# Patient Record
Sex: Male | Born: 1960 | Race: White | Hispanic: No | Marital: Married | Smoking: Never smoker
Health system: Southern US, Community
[De-identification: ages and names within clinical notes are randomized; demographics above are authoritative.]

## PROBLEM LIST (undated history)

## (undated) DIAGNOSIS — I2699 Other pulmonary embolism without acute cor pulmonale: Secondary | ICD-10-CM

## (undated) DIAGNOSIS — Z91148 Patient's other noncompliance with medication regimen for other reason: Secondary | ICD-10-CM

## (undated) DIAGNOSIS — I5020 Unspecified systolic (congestive) heart failure: Secondary | ICD-10-CM

## (undated) DIAGNOSIS — I4811 Longstanding persistent atrial fibrillation: Secondary | ICD-10-CM

## (undated) DIAGNOSIS — I4819 Other persistent atrial fibrillation: Secondary | ICD-10-CM

## (undated) DIAGNOSIS — F1010 Alcohol abuse, uncomplicated: Secondary | ICD-10-CM

## (undated) DIAGNOSIS — I428 Other cardiomyopathies: Secondary | ICD-10-CM

## (undated) DIAGNOSIS — U071 COVID-19: Secondary | ICD-10-CM

## (undated) DIAGNOSIS — I429 Cardiomyopathy, unspecified: Secondary | ICD-10-CM

## (undated) DIAGNOSIS — G8929 Other chronic pain: Secondary | ICD-10-CM

## (undated) DIAGNOSIS — Z9114 Patient's other noncompliance with medication regimen: Secondary | ICD-10-CM

---
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
## 2013-08-25 ENCOUNTER — Ambulatory Visit: Payer: Self-pay | Admitting: Unknown Physician Specialty

## 2013-10-09 DIAGNOSIS — M1990 Unspecified osteoarthritis, unspecified site: Secondary | ICD-10-CM | POA: Insufficient documentation

## 2013-10-09 DIAGNOSIS — I10 Essential (primary) hypertension: Secondary | ICD-10-CM | POA: Insufficient documentation

## 2013-10-09 DIAGNOSIS — M109 Gout, unspecified: Secondary | ICD-10-CM | POA: Insufficient documentation

## 2016-09-10 ENCOUNTER — Emergency Department: Payer: BLUE CROSS/BLUE SHIELD

## 2016-09-10 ENCOUNTER — Emergency Department
Admission: EM | Admit: 2016-09-10 | Discharge: 2016-09-10 | Disposition: A | Discharge status: Home / Self Care | Payer: BLUE CROSS/BLUE SHIELD | Attending: Emergency Medicine | Admitting: Emergency Medicine

## 2016-09-10 ENCOUNTER — Encounter: Payer: Self-pay | Admitting: *Deleted

## 2016-09-10 DIAGNOSIS — R1110 Vomiting, unspecified: Secondary | ICD-10-CM | POA: Insufficient documentation

## 2016-09-10 DIAGNOSIS — R1031 Right lower quadrant pain: Secondary | ICD-10-CM | POA: Diagnosis present

## 2016-09-10 DIAGNOSIS — N23 Unspecified renal colic: Secondary | ICD-10-CM | POA: Diagnosis not present

## 2016-09-10 LAB — LIPASE, BLOOD: Lipase: 28 U/L (ref 11–51)

## 2016-09-10 LAB — CBC WITH DIFFERENTIAL/PLATELET
Basophils Absolute: 0 10*3/uL (ref 0–0.1)
Basophils Relative: 1 %
Eosinophils Absolute: 0.1 10*3/uL (ref 0–0.7)
Eosinophils Relative: 1 %
HCT: 39.7 % — ABNORMAL LOW (ref 40.0–52.0)
Hemoglobin: 13.4 g/dL (ref 13.0–18.0)
Lymphocytes Relative: 28 %
Lymphs Abs: 1.9 10*3/uL (ref 1.0–3.6)
MCH: 30.2 pg (ref 26.0–34.0)
MCHC: 33.7 g/dL (ref 32.0–36.0)
MCV: 89.6 fL (ref 80.0–100.0)
Monocytes Absolute: 0.6 10*3/uL (ref 0.2–1.0)
Monocytes Relative: 9 %
Neutro Abs: 4.2 10*3/uL (ref 1.4–6.5)
Neutrophils Relative %: 61 %
Platelets: 198 10*3/uL (ref 150–440)
RBC: 4.43 MIL/uL (ref 4.40–5.90)
RDW: 13.7 % (ref 11.5–14.5)
WBC: 6.7 10*3/uL (ref 3.8–10.6)

## 2016-09-10 LAB — COMPREHENSIVE METABOLIC PANEL
ALT: 16 U/L — ABNORMAL LOW (ref 17–63)
AST: 31 U/L (ref 15–41)
Albumin: 4.1 g/dL (ref 3.5–5.0)
Alkaline Phosphatase: 54 U/L (ref 38–126)
Anion gap: 8 (ref 5–15)
BUN: 16 mg/dL (ref 6–20)
CO2: 25 mmol/L (ref 22–32)
Calcium: 9.1 mg/dL (ref 8.9–10.3)
Chloride: 105 mmol/L (ref 101–111)
Creatinine, Ser: 1.31 mg/dL — ABNORMAL HIGH (ref 0.61–1.24)
GFR calc Af Amer: >60 mL/min (ref >60)
GFR calc non Af Amer: >60 mL/min (ref >60)
Glucose, Bld: 120 mg/dL — ABNORMAL HIGH (ref 65–99)
Potassium: 4.1 mmol/L (ref 3.5–5.1)
Sodium: 138 mmol/L (ref 135–145)
Total Bilirubin: 0.6 mg/dL (ref 0.3–1.2)
Total Protein: 7.7 g/dL (ref 6.5–8.1)

## 2016-09-10 MED ORDER — ONDANSETRON HCL 4 MG/2ML IJ SOLN: 4.0000 mg | Freq: Once | INTRAMUSCULAR | Status: DC

## 2016-09-10 MED ORDER — HYDROMORPHONE HCL 1 MG/ML IJ SOLN
INTRAMUSCULAR | Status: AC
  Filled 2016-09-10: qty 1

## 2016-09-10 MED ORDER — KETOROLAC TROMETHAMINE 30 MG/ML IJ SOLN
15.0000 mg | INTRAMUSCULAR | Status: AC
  Administered 2016-09-10: 15 mg via INTRAVENOUS
  Filled 2016-09-10: qty 1

## 2016-09-10 MED ORDER — SODIUM CHLORIDE 0.9 % IV BOLUS (SEPSIS)
1000.0000 mL | Freq: Once | INTRAVENOUS | Status: AC
  Administered 2016-09-10: 1000 mL via INTRAVENOUS

## 2016-09-10 MED ORDER — ONDANSETRON HCL 4 MG/2ML IJ SOLN
INTRAMUSCULAR | Status: AC
  Filled 2016-09-10: qty 2

## 2016-09-10 MED ORDER — HYDROMORPHONE HCL 1 MG/ML IJ SOLN
1.0000 mg | INTRAMUSCULAR | Status: AC
  Administered 2016-09-10: 1 mg via INTRAVENOUS

## 2016-09-10 MED ORDER — HYDROMORPHONE HCL 1 MG/ML IJ SOLN: 1.0000 mg | Freq: Once | INTRAMUSCULAR | Status: DC

## 2016-09-10 MED ORDER — ONDANSETRON HCL 4 MG/2ML IJ SOLN
4.0000 mg | Freq: Once | INTRAMUSCULAR | Status: AC
  Administered 2016-09-10: 4 mg via INTRAVENOUS

## 2016-09-10 MED ORDER — NAPROXEN 500 MG PO TABS: 500.0000 mg | ORAL_TABLET | Freq: Two times a day (BID) | ORAL | 0 refills | Status: DC

## 2016-09-10 NOTE — ED Triage Notes (Signed)
States right groin pain that goes down to his scrotum that began 45 mins ago, pt shifting in chair and unable to stay still, pt pale and diaphoretic, states vomiting, denies any blood in urine

## 2016-09-10 NOTE — ED Notes (Addendum)
Pt left prior to EKG being completed.   EDP notified. Orders to be discontinued.

## 2016-09-10 NOTE — ED Notes (Signed)
Right groin and testicle pain that started suddenly X 45 min ago. Pt pale, squirming.

## 2016-09-10 NOTE — ED Notes (Signed)
Pt alert and oriented X4, active, cooperative, pt in NAD. RR even and unlabored, color WNL.  Pt informed to return if any life threatening symptoms occur.   

## 2016-09-10 NOTE — ED Provider Notes (Signed)
[facility] Emergency Department Provider Note  ____________________________________________  Time seen: Approximately 12:52 PM  I have reviewed the triage vital signs and the nursing notes.   HISTORY  Chief Complaint Groin Pain    HPI IVAAN LIDDY is a 56 y.o. male who complains of sudden onset right flank pain radiating to the right testicle started about 11:30 AM today. 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 He ate breakfast today but had not eaten lunch. Denies any recent postprandial symptoms. No known medical history. No hernias. No dysuria frequency urgency or other urinary trouble. Had vomiting today after the pain started.No fever or chills. Pain is severe, waxing and waning, no aggravating or alleviating factors.     History reviewed. No pertinent past medical history.   There are no active problems to display for this patient.    History reviewed. No pertinent surgical history.   Prior to Admission medications   Medication Sig Start Date End Date Taking? Authorizing Provider  naproxen (NAPROSYN) 500 MG tablet Take 1 tablet (500 mg total) by mouth 2 (two) times daily with a meal. 09/10/16   Carrie Mew, MD  None   Allergies Patient has no known allergies.   History reviewed. No pertinent family history.  Social History Social History  Substance Use Topics  . Smoking status: Never Smoker  . Smokeless tobacco: Never Used  . Alcohol use No    Review of Systems  Constitutional:   No fever or chills.  ENT:   No sore throat. No rhinorrhea. Cardiovascular:   No chest pain or syncope. Respiratory:   No dyspnea or cough. Gastrointestinal:   Positive as above for abdominal pain with vomiting. No diarrhea or constipation.  Musculoskeletal:   Negative for focal pain or swelling All other systems reviewed and are negative except as documented above in ROS and  HPI.  ____________________________________________   PHYSICAL EXAM:  VITAL SIGNS: ED Triage Vitals  Enc Vitals Group     BP 09/10/16 1230 (!) 164/81     Pulse Rate 09/10/16 1230 77     Resp 09/10/16 1230 18     Temp 09/10/16 1230 97.3 F (36.3 C)     Temp Source 09/10/16 1230 Oral     SpO2 09/10/16 1230 100 %     Weight 09/10/16 1228 190 lb (86.2 kg)     Height 09/10/16 1228 5\' 11"  (1.803 m)     Head Circumference --      Peak Flow --      Pain Score 09/10/16 1228 10     Pain Loc --      Pain Edu? --      Excl. in [place]? --     Vital signs reviewed, nursing assessments reviewed.   Constitutional:   Alert and oriented. Uncomfortable but not in distress. Eyes:   No scleral icterus.  EOMI. No nystagmus. No conjunctival pallor. PERRL. ENT   Head:   Normocephalic and atraumatic.   Nose:   No congestion/rhinnorhea.    Mouth/Throat:   MMM, no pharyngeal erythema. No peritonsillar mass.    Neck:   No meningismus. Full ROM Hematological/Lymphatic/Immunilogical:   No cervical lymphadenopathy. Cardiovascular:   RRR. Symmetric bilateral radial and DP pulses.  No murmurs.  Respiratory:   Normal respiratory effort without tachypnea/retractions. Breath sounds are clear and equal bilaterally. No wheezes/rales/rhonchi. Gastrointestinal:   Soft with diffuse right-sided abdominal tenderness. Non distended. There is no CVA tenderness.  No  rebound, rigidity, or guarding. Genitourinary:   Normal. No scrotal mass, testicles nontender. No horizontal lie Musculoskeletal:   Normal range of motion in all extremities. No joint effusions.  No lower extremity tenderness.  No edema. Neurologic:   Normal speech and language.  Motor grossly intact. No gross focal neurologic deficits are appreciated.  Skin:    Skin is warm, dry and intact. No rash noted.  No petechiae, purpura, or bullae.  ____________________________________________    LABS (pertinent positives/negatives) (all labs  ordered are listed, but only abnormal results are displayed) Labs Reviewed  COMPREHENSIVE METABOLIC PANEL - Abnormal; Notable for the following:       Result Value   Glucose, Bld 120 (*)    Creatinine, Ser 1.31 (*)    ALT 16 (*)    All other components within normal limits  CBC WITH DIFFERENTIAL/PLATELET - Abnormal; Notable for the following:    HCT 39.7 (*)    All other components within normal limits  URINE CULTURE  LIPASE, BLOOD  URINALYSIS, COMPLETE (UACMP) WITH MICROSCOPIC   ____________________________________________   EKG    ____________________________________________    RADIOLOGY  Ct Renal Stone Study  Result Date: 09/10/2016 CLINICAL DATA:  Right flank and groin pain.  Vomiting. EXAM: CT ABDOMEN AND PELVIS WITHOUT CONTRAST TECHNIQUE: Multidetector CT imaging of the abdomen and pelvis was performed following the standard protocol without IV contrast. COMPARISON:  None. FINDINGS: Lower chest: Small hiatal hernia. Lung bases are clear. Heart size is normal. Hepatobiliary: No focal liver abnormality is seen. No gallstones, gallbladder wall thickening, or biliary dilatation. Pancreas: Unremarkable. No pancreatic ductal dilatation or surrounding inflammatory changes. Spleen: Normal in size without focal abnormality. Adrenals/Urinary Tract: There is slight prominence of the right renal collecting system. There is a new 1 mm calcification in the right side of the bladder consistent with a recently passed stone. No renal calculi. Left kidney appears normal. Bladder and adrenal glands are normal. Stomach/Bowel: Stomach is within normal limits. Appendix appears normal. No evidence of bowel wall thickening, distention, or inflammatory changes. Multiple diverticula scattered in the colon, most prominent in the sigmoid region. Vascular/Lymphatic: The minimal aortic atherosclerosis. No adenopathy. Reproductive: Prostate is unremarkable. Other: Tiny bilateral inguinal hernias containing only  fat, left larger than right. Musculoskeletal: No acute or significant osseous findings. IMPRESSION: 1 mm calcification in the right side of the bladder consistent with a recently passed right ureteral stone. Minimal dilatation of the right renal collecting system. Otherwise, no acute abnormality. Diverticulosis of the colon. Electronically Signed   By: Lorriane Shire M.D.   On: 09/10/2016 13:29    ____________________________________________   PROCEDURES Procedures  ____________________________________________   INITIAL IMPRESSION / ASSESSMENT AND PLAN / ED COURSE  Pertinent labs & imaging results that were available during my care of the patient were reviewed by me and considered in my medical decision making (see chart for details).  Patient presents with severe abdominal pain sudden onset. Differential includes renal colic, appendicitis, cholecystitis. Unlikely torsion AAA bowel obstruction or perforation. Doubt pancreatitis. Very low suspicion for cardiopulmonary pathology such as ACS PE or dissection. We'll check labs, CT abdomen, Dilaudid Zofran and IV fluids.   ----------------------------------------- 3:46 PM on 09/10/2016 -----------------------------------------  Abdomen benign, no vomiting. Tolerating oral intake. Pain dramatically improved. CT shows 1 mm stone in the bladder consistent with ureteral colic which she is now passed the stone. Condition should be very self-limited at this point. We'll discharge home, follow up with primary care.Considering the patient's symptoms, medical history, and  physical examination today, I have low suspicion for cholecystitis or biliary pathology, pancreatitis, perforation or bowel obstruction, hernia, intra-abdominal abscess, AAA or dissection, volvulus or intussusception, mesenteric ischemia, or appendicitis.        ____________________________________________   FINAL CLINICAL IMPRESSION(S) / ED DIAGNOSES  Final diagnoses:   Ureteral colic      New Prescriptions   NAPROXEN (NAPROSYN) 500 MG TABLET    Take 1 tablet (500 mg total) by mouth 2 (two) times daily with a meal.     Portions of this note were generated with dragon dictation software. Dictation errors may occur despite best attempts at proofreading.    Carrie Mew, MD 09/10/16 [phone]

## 2017-11-09 ENCOUNTER — Other Ambulatory Visit: Payer: Self-pay | Admitting: Sports Medicine

## 2017-11-09 DIAGNOSIS — M5441 Lumbago with sciatica, right side: Principal | ICD-10-CM

## 2017-11-09 DIAGNOSIS — G8929 Other chronic pain: Secondary | ICD-10-CM

## 2017-11-09 DIAGNOSIS — M47816 Spondylosis without myelopathy or radiculopathy, lumbar region: Secondary | ICD-10-CM

## 2017-11-09 DIAGNOSIS — M5136 Other intervertebral disc degeneration, lumbar region: Secondary | ICD-10-CM

## 2017-11-30 ENCOUNTER — Ambulatory Visit: Payer: BLUE CROSS/BLUE SHIELD

## 2018-08-28 ENCOUNTER — Emergency Department: Payer: BLUE CROSS/BLUE SHIELD

## 2018-08-28 ENCOUNTER — Encounter: Payer: Self-pay | Admitting: Emergency Medicine

## 2018-08-28 ENCOUNTER — Emergency Department
Admission: EM | Admit: 2018-08-28 | Discharge: 2018-08-28 | Disposition: A | Discharge status: Home / Self Care | Payer: BLUE CROSS/BLUE SHIELD | Attending: Emergency Medicine | Admitting: Emergency Medicine

## 2018-08-28 DIAGNOSIS — R002 Palpitations: Secondary | ICD-10-CM | POA: Diagnosis present

## 2018-08-28 DIAGNOSIS — F17220 Nicotine dependence, chewing tobacco, uncomplicated: Secondary | ICD-10-CM | POA: Diagnosis not present

## 2018-08-28 DIAGNOSIS — I4891 Unspecified atrial fibrillation: Secondary | ICD-10-CM | POA: Diagnosis not present

## 2018-08-28 LAB — CBC WITH DIFFERENTIAL/PLATELET
Abs Immature Granulocytes: 0.02 10*3/uL (ref 0.00–0.07)
Basophils Absolute: 0 10*3/uL (ref 0.0–0.1)
Basophils Relative: 1 %
Eosinophils Absolute: 0.1 10*3/uL (ref 0.0–0.5)
Eosinophils Relative: 2 %
HCT: 39.1 % (ref 39.0–52.0)
Hemoglobin: 12.4 g/dL — ABNORMAL LOW (ref 13.0–17.0)
Immature Granulocytes: 0 %
Lymphocytes Relative: 21 %
Lymphs Abs: 1.2 10*3/uL (ref 0.7–4.0)
MCH: 29 pg (ref 26.0–34.0)
MCHC: 31.7 g/dL (ref 30.0–36.0)
MCV: 91.6 fL (ref 80.0–100.0)
Monocytes Absolute: 0.4 10*3/uL (ref 0.1–1.0)
Monocytes Relative: 7 %
Neutro Abs: 4 10*3/uL (ref 1.7–7.7)
Neutrophils Relative %: 69 %
Platelets: 200 10*3/uL (ref 150–400)
RBC: 4.27 MIL/uL (ref 4.22–5.81)
RDW: 13 % (ref 11.5–15.5)
WBC: 5.7 10*3/uL (ref 4.0–10.5)
nRBC: 0 % (ref 0.0–0.2)

## 2018-08-28 LAB — LIPASE, BLOOD: Lipase: 30 U/L (ref 11–51)

## 2018-08-28 LAB — COMPREHENSIVE METABOLIC PANEL
ALT: 21 U/L (ref 0–44)
AST: 29 U/L (ref 15–41)
Albumin: 3.7 g/dL (ref 3.5–5.0)
Alkaline Phosphatase: 45 U/L (ref 38–126)
Anion gap: 13 (ref 5–15)
BUN: 17 mg/dL (ref 6–20)
CO2: 19 mmol/L — ABNORMAL LOW (ref 22–32)
Calcium: 8.6 mg/dL — ABNORMAL LOW (ref 8.9–10.3)
Chloride: 109 mmol/L (ref 98–111)
Creatinine, Ser: 1.02 mg/dL (ref 0.61–1.24)
GFR calc Af Amer: >60 mL/min (ref >60)
GFR calc non Af Amer: >60 mL/min (ref >60)
Glucose, Bld: 123 mg/dL — ABNORMAL HIGH (ref 70–99)
Potassium: 3.5 mmol/L (ref 3.5–5.1)
Sodium: 141 mmol/L (ref 135–145)
Total Bilirubin: 0.4 mg/dL (ref 0.3–1.2)
Total Protein: 6.3 g/dL — ABNORMAL LOW (ref 6.5–8.1)

## 2018-08-28 LAB — TSH: TSH: 0.96 u[IU]/mL (ref 0.350–4.500)

## 2018-08-28 LAB — MAGNESIUM: Magnesium: 1.9 mg/dL (ref 1.7–2.4)

## 2018-08-28 MED ORDER — METOPROLOL TARTRATE 25 MG PO TABS
25.0000 mg | ORAL_TABLET | Freq: Once | ORAL | Status: AC
  Administered 2018-08-28: 15:00:00 25 mg via ORAL
  Filled 2018-08-28: qty 1

## 2018-08-28 MED ORDER — METOPROLOL SUCCINATE ER 100 MG PO TB24: 100.0000 mg | ORAL_TABLET | Freq: Every day | ORAL | 0 refills | Status: DC

## 2018-08-28 MED ORDER — DILTIAZEM HCL 25 MG/5ML IV SOLN
15.0000 mg | Freq: Once | INTRAVENOUS | Status: AC
  Administered 2018-08-28: 12:00:00 15 mg via INTRAVENOUS
  Filled 2018-08-28: qty 5

## 2018-08-28 MED ORDER — METOPROLOL TARTRATE 5 MG/5ML IV SOLN
2.5000 mg | Freq: Once | INTRAVENOUS | Status: AC
  Administered 2018-08-28: 15:00:00 2.5 mg via INTRAVENOUS
  Filled 2018-08-28: qty 5

## 2018-08-28 MED ORDER — RIVAROXABAN 20 MG PO TABS: 20.0000 mg | ORAL_TABLET | Freq: Every day | ORAL | 0 refills | Status: DC

## 2018-08-28 MED ORDER — METOPROLOL TARTRATE 5 MG/5ML IV SOLN: 5.0000 mg | Freq: Once | INTRAVENOUS | Status: DC

## 2018-08-28 MED ORDER — RIVAROXABAN 20 MG PO TABS
20.0000 mg | ORAL_TABLET | Freq: Once | ORAL | Status: AC
  Administered 2018-08-28: 15:00:00 20 mg via ORAL
  Filled 2018-08-28: qty 1

## 2018-08-28 NOTE — ED Notes (Signed)
Pt updating family

## 2018-08-28 NOTE — Discharge Instructions (Addendum)
Start taking metoprolol and Xarelto tomorrow.  I have provided a coupon for an initial first month of Xarelto.  He should follow-up with cardiology within the next week, for possible cardioversion and further work-up.  Try to avoid alcohol.  Drink plenty of fluids.

## 2018-08-28 NOTE — ED Triage Notes (Signed)
Pt to ED by EMS with c/o of chest pain that started yesterday. Pt states feels like he doesn't have any energy and his chest felt tight when he woke this morning. 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 Upon EMS arrival pt in Afib and has no hx.

## 2018-08-28 NOTE — ED Notes (Signed)
Pt HR 95 at rest, walked in room for 3 minutes, HR mostly 110-120, up to 130 for 1 second. HR at 86-93 after pt back in bed.

## 2018-08-28 NOTE — ED Provider Notes (Signed)
[facility] Emergency Department Provider Note  ____________________________________________   First MD Initiated Contact with Patient 08/28/18 1057     (approximate)  I have reviewed the triage vital signs and the nursing notes.   HISTORY  Chief Complaint Chest Pain    HPI Calvin Esparza is a 58 y.o. male here with shortness of breath and palpitations.  The patient states that he was in his usual state of health until earlier today.  He went to bed last night.  He did drink alcohol, which he does regularly every night.  He states that he woke up, and felt some mild pressure in his chest.  The pressure is persisted throughout the day today.  He has had associated  sensation that his heart was beating very quickly.  He went to work, and noticed that he felt very short of breath with any kind of exertion.  He subsequent presents for evaluation.  He denies any pain currently.  Denies any actual pain, but did feel palpitations as mentioned.  No other complaints.  No recent fevers or chills.  No recent medication changes or supplement use.  He has no cardiac history.     History reviewed. No pertinent past medical history.  There are no active problems to display for this patient.   History reviewed. No pertinent surgical history.  Prior to Admission medications   Medication Sig Start Date End Date Taking? Authorizing Provider  metoprolol succinate (TOPROL XL) 100 MG 24 hr tablet Take 1 tablet (100 mg total) by mouth daily for 30 days. Take with or immediately following a meal. 08/28/18 09/27/18  Duffy Bruce, MD  rivaroxaban (XARELTO) 20 MG TABS tablet Take 1 tablet (20 mg total) by mouth daily with supper. 08/28/18   Duffy Bruce, MD    Allergies Patient has no known allergies.  No family history on file.  Social History Social History   Tobacco Use  . Smoking status: Never Smoker  . Smokeless tobacco: Current User    Types: Chew  Substance  Use Topics  . Alcohol use: Yes  . Drug use: Not Currently    Review of Systems    Review of Systems  Constitutional: Positive for fatigue. Negative for chills and fever.  HENT: Negative for congestion and rhinorrhea.   Eyes: Negative for visual disturbance.  Respiratory: Positive for shortness of breath. Negative for cough.   Cardiovascular: Positive for palpitations.  Gastrointestinal: Negative for abdominal pain, diarrhea, nausea and vomiting.  Genitourinary: Negative for dysuria and flank pain.  Skin: Negative for rash and wound.  Neurological: Negative for weakness and light-headedness.  All other systems reviewed and are negative.    ____________________________________________  PHYSICAL EXAM:      VITAL SIGNS: ED Triage Vitals  Enc Vitals Group     BP 08/28/18 1109 (!) 147/105     Pulse Rate 08/28/18 1109 (!) 104     Resp 08/28/18 1109 18     Temp 08/28/18 1109 97.8 F (36.6 C)     Temp Source 08/28/18 1109 Oral     SpO2 08/28/18 1101 100 %     Weight 08/28/18 1110 178 lb (80.7 kg)     Height 08/28/18 1110 5\' 10"  (1.778 m)     Head Circumference --      Peak Flow --      Pain Score 08/28/18 1109 2     Pain Loc --      Pain Edu? --  Excl. in [place]? --      Physical Exam Vitals signs and nursing note reviewed.  Constitutional:      General: He is not in acute distress.    Appearance: He is well-developed.  HENT:     Head: Normocephalic and atraumatic.  Eyes:     Conjunctiva/sclera: Conjunctivae normal.  Neck:     Musculoskeletal: Neck supple.  Cardiovascular:     Rate and Rhythm: Tachycardia present. Rhythm irregularly irregular.     Heart sounds: Normal heart sounds. No murmur. No friction rub.  Pulmonary:     Effort: Pulmonary effort is normal. No respiratory distress.     Breath sounds: Normal breath sounds. No wheezing or rales.  Abdominal:     General: There is no distension.     Palpations: Abdomen is soft.     Tenderness: There is no  abdominal tenderness.  Skin:    General: Skin is warm.     Capillary Refill: Capillary refill takes less than 2 seconds.  Neurological:     Mental Status: He is alert and oriented to person, place, and time.     Motor: No abnormal muscle tone.       ____________________________________________   LABS (all labs ordered are listed, but only abnormal results are displayed)  Labs Reviewed  CBC WITH DIFFERENTIAL/PLATELET - Abnormal; Notable for the following components:      Result Value   Hemoglobin 12.4 (*)    All other components within normal limits  COMPREHENSIVE METABOLIC PANEL - Abnormal; Notable for the following components:   CO2 19 (*)    Glucose, Bld 123 (*)    Calcium 8.6 (*)    Total Protein 6.3 (*)    All other components within normal limits  LIPASE, BLOOD  MAGNESIUM  TSH    ____________________________________________  EKG: Tachycardia, ventricular rate 116.  New onset atrial fibrillation compared to prior.  Nonspecific ST changes, likely rate related.  No ST elevations or depressions. QRS narrow at 85. QTC wnl. ________________________________________  RADIOLOGY All imaging, including plain films, CT scans, and ultrasounds, independently reviewed by me, and interpretations confirmed via formal radiology reads.  ED MD interpretation:   CXR: Clear, no cardiomegaly or CHF, PNA, PTX  Official radiology report(s): Dg Chest 2 View  Result Date: 08/28/2018 CLINICAL DATA:  Atrial fibrillation. EXAM: CHEST - 2 VIEW COMPARISON:  None. FINDINGS: The heart, hila, mediastinum, lungs, and pleura are unremarkable. IMPRESSION: No active cardiopulmonary disease. Electronically Signed   By: Dorise Bullion III M.D   On: 08/28/2018 12:40    ____________________________________________  PROCEDURES   Procedure(s) performed (including Critical Care):  Procedures  ____________________________________________  INITIAL IMPRESSION / MDM / [place] / ED COURSE   As part of my medical decision making, I reviewed the following data within the electronic MEDICAL RECORD NUMBER Notes from prior ED visits and [place] Controlled Substance Database      *Calvin Esparza was evaluated in Emergency Department on 08/28/2018 for the symptoms described in the history of present illness. He was evaluated in the context of the global COVID-19 pandemic, which necessitated consideration that the patient might be at risk for infection with the SARS-CoV-2 virus that causes COVID-19. Institutional protocols and algorithms that pertain to the evaluation of patients at risk for COVID-19 are in a state of rapid change based on information released by regulatory bodies including the CDC and federal and state organizations. These policies and algorithms were followed during the patient's care in the  ED.  Some ED evaluations and interventions may be delayed as a result of limited staffing during the pandemic.*      CHA2Ds2-VASc Score for Atrial Fibrillation 0   Medical Decision Making: 58 yo M here with new onset atrial fibrillation. Suspect this is 2/2 his alcohol use, also made worse by dehydration related to work. Labs show mild anemia, CMP with CO2 19 likely 2/2 dehydration. Mag normal. TSH normal. CXR is clear. EKG non-ischemic, denies any CP and I do not suspect ACS. Following single dose of diltiazem, pt had excellent rate control and is ambulatory w/o symptoms. HR 90s when resting. I had a long discussion with pt re: admission, obs, d/c. Given that his labs are reassuring, VSS, and he is well appearing with good rate control now, feel it's reasonable to manage as outpt. Discussed case with Dr. Evert Kohl of Cardiology who is in agreement. Per Cards, will start on metop 100 QD, Xarelto, and d/c. I discussed risks/benefits of Xarelto, and he will take as he may be candidate for cardioversion this week. CHADS-VASC 0 so may not need long-term anticoag after DCCV per Cards, who is recommending  the anticoagulation. Coupon provided. Month of meds provided.  ____________________________________________  FINAL CLINICAL IMPRESSION(S) / ED DIAGNOSES  Final diagnoses:  New onset atrial fibrillation ([place])     MEDICATIONS GIVEN DURING THIS VISIT:  Medications  diltiazem (CARDIZEM) injection 15 mg (15 mg Intravenous Given 08/28/18 1223)  metoprolol tartrate (LOPRESSOR) tablet 25 mg (25 mg Oral Given 08/28/18 1457)  rivaroxaban (XARELTO) tablet 20 mg (20 mg Oral Given 08/28/18 1457)  metoprolol tartrate (LOPRESSOR) injection 2.5 mg (2.5 mg Intravenous Given 08/28/18 1458)     ED Discharge Orders         Ordered    metoprolol succinate (TOPROL XL) 100 MG 24 hr tablet  Daily     08/28/18 1456    rivaroxaban (XARELTO) 20 MG TABS tablet  Daily with supper     08/28/18 1456           Note:  This document was prepared using Dragon voice recognition software and may include unintentional dictation errors.   Duffy Bruce, MD 08/28/18 1806

## 2018-10-01 DIAGNOSIS — I4891 Unspecified atrial fibrillation: Secondary | ICD-10-CM | POA: Insufficient documentation

## 2018-10-01 DIAGNOSIS — F1010 Alcohol abuse, uncomplicated: Secondary | ICD-10-CM | POA: Insufficient documentation

## 2018-10-01 NOTE — Progress Notes (Signed)
Cardiology Office Note  Date:  10/04/2018   ID:  Calvin Esparza, DOB April 02, 1960, MRN [id number]  PCP:  Calvin Ruths, MD   Chief Complaint  Patient presents with  . office visit    F/U after ED visit for A Fib    HPI:  [name] is a 58 year old gentleman with past medical history of Alcohol abuse Chronic pain, knee pain Recently seen in the emergency room August 28, 2018 for chest pain, dizzy, atrial fibrillation Heart rate from 110 up to 130 bpm Who presents to establish care in the [place] office for his paroxysmal atrial fibrillation  Discussed recent events in the emergency room June 2020 diagnosis of atrial fibrillation  Was started on anticoagulation, Xarelto, metoprolol 100 daily  Symptoms leading to his ER visit , had diaphoresis, sob,  Out of xarleto the past several days  Thinks atrial fib lasted a few days then went back to normal No stressor before atrial fib, denies excessive alcohol, pain diarrhea URI symptoms  At baseline pours concrete, active, no chest pains or shortness of breath Reports he is nondiabetic, no hypertension at baseline, no prior stroke,  chads vasc 0, possibly 1 if you include hypertension  EKG personally reviewed by myself on todays visit Shows NSR raet 66 bpm, no significant ST or T wave changes   PMH:   has no past medical history on file.  PSH:   History reviewed. No pertinent surgical history.  Current Outpatient Medications  Medication Sig Dispense Refill  . metoprolol succinate (TOPROL XL) 100 MG 24 hr tablet Take 1 tablet (100 mg total) by mouth daily for 30 days. Take with or immediately following a meal. 30 tablet 0  . rivaroxaban (XARELTO) 20 MG TABS tablet Take 1 tablet (20 mg total) by mouth daily with supper. 30 tablet 0   No current facility-administered medications for this visit.      Allergies:   Patient has no known allergies.   Social History:  The patient  reports that he has never smoked.  His smokeless tobacco use includes chew. He reports current alcohol use. He reports previous drug use.   Family History:   family history includes Bone cancer in his father; Diabetes in his sister; Stroke in his father.    Review of Systems: Review of Systems  Constitutional: Negative.   HENT: Negative.   Respiratory: Negative.   Cardiovascular: Negative.   Gastrointestinal: Negative.   Musculoskeletal: Negative.   Neurological: Negative.   Psychiatric/Behavioral: Negative.   All other systems reviewed and are negative.    PHYSICAL EXAM: VS:  BP (!) 150/90 (BP Location: Right Arm, Patient Position: Sitting, Cuff Size: Normal)   Pulse 66   Temp 98.1 F (36.7 C)   Ht 5\' 9"  (1.753 m)   Wt 182 lb (82.6 kg)   SpO2 99%   BMI 26.88 kg/m  , BMI Body mass index is 26.88 kg/m. GEN: Well nourished, well developed, in no acute distress HEENT: normal Neck: no JVD, carotid bruits, or masses Cardiac: RRR; no murmurs, rubs, or gallops,no edema  Respiratory:  clear to auscultation bilaterally, normal work of breathing GI: soft, nontender, nondistended, + BS MS: no deformity or atrophy Skin: warm and dry, no rash Neuro:  Strength and sensation are intact Psych: euthymic mood, full affect    Recent Labs: 08/28/2018: ALT 21; BUN 17; Creatinine, Ser 1.02; Hemoglobin 12.4; Magnesium 1.9; Platelets 200; Potassium 3.5; Sodium 141; TSH 0.960    Lipid Panel No results  found for: CHOL, HDL, LDLCALC, TRIG    Wt Readings from Last 3 Encounters:  10/04/18 182 lb (82.6 kg)  08/28/18 178 lb (80.7 kg)  09/10/16 190 lb (86.2 kg)       ASSESSMENT AND PLAN:  Problem List Items Addressed This Visit      Cardiology Problems   Atrial fibrillation ([place])   Relevant Orders   EKG 12-Lead     Other   Alcohol abuse     Long discussion concerning atrial fibrillation chads vasc 0-1, unclear if he has hypertension, likely situational today We will continue to hold Xarelto, has been out of  this for several days We will continue metoprolol succinate at 50 mg dosage We will follow-up with primary care Echocardiogram ordered He will call insurance to make sure this is covered  Discussed contributors to A. fib such as sleep apnea, obesity, alcohol among other stressors  Discussed the plan if he has recurrent atrial fibrillation, would take extra metoprolol and call our office  Disposition:   F/U  12 months   Total encounter time more than 45 minutes  Greater than 50% was spent in counseling and coordination of care with the patient    Signed, Esmond Plants, M.D., Ph.D. [place], [place]

## 2018-10-04 ENCOUNTER — Ambulatory Visit (INDEPENDENT_AMBULATORY_CARE_PROVIDER_SITE_OTHER): Payer: BLUE CROSS/BLUE SHIELD | Admitting: Cardiovascular Disease

## 2018-10-04 ENCOUNTER — Encounter

## 2018-10-04 ENCOUNTER — Encounter: Payer: Self-pay | Admitting: Cardiovascular Disease

## 2018-10-04 DIAGNOSIS — F1010 Alcohol abuse, uncomplicated: Secondary | ICD-10-CM

## 2018-10-04 DIAGNOSIS — I480 Paroxysmal atrial fibrillation: Secondary | ICD-10-CM

## 2018-10-04 MED ORDER — METOPROLOL SUCCINATE ER 50 MG PO TB24: 50.0000 mg | ORAL_TABLET | Freq: Every day | ORAL | 3 refills | Status: DC

## 2018-10-04 NOTE — Patient Instructions (Addendum)
Medication Instructions:  Your physician has recommended you make the following change in your medication:   1) Start metoprolol succinate 50 mg- take 1 tablet daily for atrial fib  2) Stop xarelto  If you need a refill on your cardiac medications before your next appointment, please call your pharmacy.    Lab work: No new labs needed   If you have labs (blood work) drawn today and your tests are completely normal, you will receive your results only by: Marland Kitchen MyChart Message (if you have MyChart) OR . A paper copy in the mail If you have any lab test that is abnormal or we need to change your treatment, we will call you to review the results.   Testing/Procedures: Echo at [facility], outpt for atrial fib  Your physician has requested that you have an echocardiogram at [facility]. Echocardiography is a painless test that uses sound waves to create images of your heart. It provides your doctor with information about the size and shape of your heart and how well your heart's chambers and valves are working. This procedure takes approximately one hour. There are no restrictions for this procedure.  Follow-Up: At [facility], you and your health needs are our priority.  As part of our continuing mission to provide you with exceptional heart care, we have created designated Provider Care Teams.  These Care Teams include your primary Cardiologist (physician) and Advanced Practice Providers (APPs -  Physician Assistants and Nurse Practitioners) who all work together to provide you with the care you need, when you need it.  . You will need a follow up appointment in 12 months (July 2021) .   Please call our office 2 months in advance to schedule this appointment.  (call in early May 2021 to schedule)  . Providers on your designated Care Team:   . Murray Hodgkins, NP . Christell Faith, PA-C . Marrianne Mood, PA-C  Any Other Special Instructions Will Be Listed Below (If Applicable).  For educational  health videos Log in to : [email or web address] Or : [email or web address], password : [email or web address]    Echocardiogram An echocardiogram is a procedure that uses painless sound waves (ultrasound) to produce an image of the heart. Images from an echocardiogram can provide important information about:  Signs of coronary artery disease (CAD).  Aneurysm detection. An aneurysm is a weak or damaged part of an artery wall that bulges out from the normal force of blood pumping through the body.  Heart size and shape. Changes in the size or shape of the heart can be associated with certain conditions, including heart failure, aneurysm, and CAD.  Heart muscle function.  Heart valve function.  Signs of a past heart attack.  Fluid buildup around the heart.  Thickening of the heart muscle.  A tumor or infectious growth around the heart valves. Tell a health care provider about:  Any allergies you have.  All medicines you are taking, including vitamins, herbs, eye drops, creams, and over-the-counter medicines.  Any blood disorders you have.  Any surgeries you have had.  Any medical conditions you have.  Whether you are pregnant or may be pregnant. What are the risks? Generally, this is a safe procedure. However, problems may occur, including:  Allergic reaction to dye (contrast) that may be used during the procedure. What happens before the procedure? No specific preparation is needed. You may eat and drink normally. What happens during the procedure?   An IV tube may be inserted into one of your veins.  You may receive contrast through this tube. A contrast is an injection that improves the quality of the pictures from your heart.  A gel will be applied to your chest.  A wand-like tool (transducer) will be moved over your chest. The gel will help to transmit the sound waves from the transducer.  The sound waves will harmlessly bounce off of your heart to allow the heart images to be captured in  real-time motion. The images will be recorded on a computer. The procedure may vary among health care providers and hospitals. What happens after the procedure?  You may return to your normal, everyday life, including diet, activities, and medicines, unless your health care provider tells you not to do that. Summary  An echocardiogram is a procedure that uses painless sound waves (ultrasound) to produce an image of the heart.  Images from an echocardiogram can provide important information about the size and shape of your heart, heart muscle function, heart valve function, and fluid buildup around your heart.  You do not need to do anything to prepare before this procedure. You may eat and drink normally.  After the echocardiogram is completed, you may return to your normal, everyday life, unless your health care provider tells you not to do that. This information is not intended to replace advice given to you by your health care provider. Make sure you discuss any questions you have with your health care provider. Document Released: 02/29/2000 Document Revised: 06/24/2018 Document Reviewed: 04/05/2016 Elsevier Patient Education  2020 [employer].    Atrial Fibrillation Atrial fibrillation is a type of irregular or rapid heartbeat (arrhythmia). In atrial fibrillation, the top part of the heart (atria) quivers in a chaotic pattern. This makes the heart unable to pump blood normally. Having atrial fibrillation can increase your risk for other health problems, such as:  Blood can pool in the atria and form clots. If a clot travels to the brain, it can cause a stroke.  The heart muscle may weaken from the irregular blood flow. This can cause heart failure. Atrial fibrillation may start suddenly and stop on its own, or it may become a long-lasting problem. What are the causes? This condition is caused by some heart-related conditions or procedures, including:  High blood pressure. This is  the most common cause.  Heart failure.  Heart valve conditions.  Inflammation of the sac that surrounds the heart (pericarditis).  Heart surgery.  Coronary artery disease.  Certain heart rhythm disorders, such as Wolf-Parkinson-White syndrome. Other causes include:  Pneumonia.  Obstructive sleep apnea.  Lung cancer.  Thyroid problems, especially if the thyroid is overactive (hyperthyroidism).  Excessive alcohol or drug use. Sometimes, the cause of this condition is not known. What increases the risk? This condition is more likely to develop in:  Older people.  People who smoke.  People who have diabetes mellitus.  People who are overweight (obese).  Athletes who exercise vigorously.  People who have a family history. What are the signs or symptoms? Symptoms of this condition include:  A feeling that your heart is beating rapidly or irregularly.  A feeling of discomfort or pain in your chest.  Shortness of breath.  Sudden light-headedness or weakness.  Getting tired easily during exercise. In some cases, there are no symptoms. How is this diagnosed? Your health care provider may be able to detect atrial fibrillation when taking your pulse. If detected, this condition may be diagnosed with:  Electrocardiogram (ECG).  Ambulatory cardiac monitor. This device  records your heartbeats for 24 hours or more.  Transthoracic echocardiogram (TTE) to evaluate how blood flows through your heart.  Transesophageal echocardiogram (TEE) to view more detailed images of your heart.  A stress test.  Imaging tests, such as a CT scan or chest X-ray.  Blood tests. How is this treated? This condition may be treated with:  Medicines to slow down the heart rate or bring the heart's rhythm back to normal.  Medicines to prevent blood clots from forming.  Electrical cardioversion. This delivers a low-energy shock to the heart to reset its rhythm.  Ablation. This  procedure destroys the part of the heart tissue that sends abnormal signals.  Left atrial appendage occlusion/excision. This seals off a common place in the atria where blood clots can form (left atrial appendage). The goal of treatment is to prevent blood clots from forming and to keep your heart beating at a normal rate and rhythm. Treatment depends on underlying medical conditions and how you feel when you are experiencing fibrillation. Follow these instructions at home: Medicines  Take over-the counter and prescription medicines only as told by your health care provider.  If your health care provider prescribed a blood-thinning medicine (anticoagulant), take it exactly as told. Taking too much blood-thinning medicine can cause bleeding. Taking too little can enable a blood clot to form and travel to the brain, causing a stroke. Lifestyle      Do not use any products that contain nicotine or tobacco, such as cigarettes and e-cigarettes. If you need help quitting, ask your health care provider.  Do not drink beverages that contain caffeine, such as coffee, soda, and tea.  Follow diet instructions as told by your health care provider.  Exercise regularly as told by your health care provider.  Do not drink alcohol. General instructions  If you have obstructive sleep apnea, manage your condition as told by your health care provider.  Maintain a healthy weight. Do not use diet pills unless your health care provider approves. Diet pills may make heart problems worse.  Keep all follow-up visits as told by your health care provider. This is important. Contact a health care provider if you:  Notice a change in the rate, rhythm, or strength of your heartbeat.  Are taking an anticoagulant and you notice increased bruising.  Tire more easily when you exercise or exert yourself.  Have a sudden change in weight. Get help right away if you have:   Chest pain, abdominal pain, sweating,  or weakness.  Difficulty breathing.  Blood in your vomit, stool (feces), or urine.  Any symptoms of a stroke. "BE FAST" is an easy way to remember the main warning signs of a stroke: ? B - Balance. Signs are dizziness, sudden trouble walking, or loss of balance. ? E - Eyes. Signs are trouble seeing or a sudden change in vision. ? F - Face. Signs are sudden weakness or numbness of the face, or the face or eyelid drooping on one side. ? A - Arms. Signs are weakness or numbness in an arm. This happens suddenly and usually on one side of the body. ? S - Speech. Signs are sudden trouble speaking, slurred speech, or trouble understanding what people say. ? T - Time. Time to call emergency services. Write down what time symptoms started.  Other signs of a stroke, such as: ? A sudden, severe headache with no known cause. ? Nausea or vomiting. ? Seizure. These symptoms may represent a serious problem that is  an emergency. Do not wait to see if the symptoms will go away. Get medical help right away. Call your local emergency services (911 in the [place] Do not drive yourself to the hospital. Summary  Atrial fibrillation is a type of irregular or rapid heartbeat (arrhythmia).  Symptoms include a feeling that your heart is beating fast or irregularly. In some cases, you may not have symptoms.  The condition is treated with medicines to slow down the heart rate or bring the heart's rhythm back to normal. You may also need blood-thinning medicines to prevent blood clots.  Get help right away if you have symptoms or signs of a stroke. This information is not intended to replace advice given to you by your health care provider. Make sure you discuss any questions you have with your health care provider. Document Released: 03/03/2005 Document Revised: 04/23/2017 Document Reviewed: 04/24/2017 Elsevier Patient Education  2020 [employer].

## 2018-10-12 ENCOUNTER — Ambulatory Visit: Payer: BLUE CROSS/BLUE SHIELD | Attending: Cardiovascular Disease

## 2019-03-14 ENCOUNTER — Other Ambulatory Visit: Payer: Self-pay

## 2019-03-14 ENCOUNTER — Emergency Department
Admission: EM | Admit: 2019-03-14 | Discharge: 2019-03-14 | Disposition: A | Discharge status: Home / Self Care | Payer: BLUE CROSS/BLUE SHIELD | Attending: Student | Admitting: Student

## 2019-03-14 ENCOUNTER — Encounter: Payer: Self-pay | Admitting: *Deleted

## 2019-03-14 ENCOUNTER — Emergency Department: Payer: BLUE CROSS/BLUE SHIELD

## 2019-03-14 DIAGNOSIS — R0602 Shortness of breath: Secondary | ICD-10-CM | POA: Diagnosis present

## 2019-03-14 DIAGNOSIS — F17220 Nicotine dependence, chewing tobacco, uncomplicated: Secondary | ICD-10-CM | POA: Diagnosis not present

## 2019-03-14 DIAGNOSIS — U071 COVID-19: Secondary | ICD-10-CM

## 2019-03-14 DIAGNOSIS — R531 Weakness: Secondary | ICD-10-CM

## 2019-03-14 LAB — COMPREHENSIVE METABOLIC PANEL
ALT: 69 U/L — ABNORMAL HIGH (ref 0–44)
AST: 138 U/L — ABNORMAL HIGH (ref 15–41)
Albumin: 4.1 g/dL (ref 3.5–5.0)
Alkaline Phosphatase: 61 U/L (ref 38–126)
Anion gap: 16 — ABNORMAL HIGH (ref 5–15)
BUN: 10 mg/dL (ref 6–20)
CO2: 19 mmol/L — ABNORMAL LOW (ref 22–32)
Calcium: 9.2 mg/dL (ref 8.9–10.3)
Chloride: 100 mmol/L (ref 98–111)
Creatinine, Ser: 0.93 mg/dL (ref 0.61–1.24)
GFR calc Af Amer: >60 mL/min (ref >60)
GFR calc non Af Amer: >60 mL/min (ref >60)
Glucose, Bld: 101 mg/dL — ABNORMAL HIGH (ref 70–99)
Potassium: 3.9 mmol/L (ref 3.5–5.1)
Sodium: 135 mmol/L (ref 135–145)
Total Bilirubin: 1.4 mg/dL — ABNORMAL HIGH (ref 0.3–1.2)
Total Protein: 7.7 g/dL (ref 6.5–8.1)

## 2019-03-14 LAB — CBC
HCT: 45.9 % (ref 39.0–52.0)
Hemoglobin: 15.4 g/dL (ref 13.0–17.0)
MCH: 29.7 pg (ref 26.0–34.0)
MCHC: 33.6 g/dL (ref 30.0–36.0)
MCV: 88.4 fL (ref 80.0–100.0)
Platelets: 114 10*3/uL — ABNORMAL LOW (ref 150–400)
RBC: 5.19 MIL/uL (ref 4.22–5.81)
RDW: 13 % (ref 11.5–15.5)
WBC: 3.5 10*3/uL — ABNORMAL LOW (ref 4.0–10.5)
nRBC: 0 % (ref 0.0–0.2)

## 2019-03-14 LAB — POC SARS CORONAVIRUS 2 AG: SARS Coronavirus 2 Ag: POSITIVE — AB

## 2019-03-14 LAB — TROPONIN I (HIGH SENSITIVITY): Troponin I (High Sensitivity): 7 ng/L (ref <18)

## 2019-03-14 LAB — BRAIN NATRIURETIC PEPTIDE: B Natriuretic Peptide: 316 pg/mL — ABNORMAL HIGH (ref 0.0–100.0)

## 2019-03-14 MED ORDER — BENZONATATE 100 MG PO CAPS: 100.0000 mg | ORAL_CAPSULE | Freq: Three times a day (TID) | ORAL | 0 refills | Status: DC | PRN

## 2019-03-14 NOTE — Discharge Instructions (Signed)
Thank you for letting us take care of you in the emergency department today.  Your COVID-19 test was POSITIVE today  Please obtain the following medications, over-the-counter, and start taking: -Baby aspirin, 81 mg -Zinc -Vitamin D -Probiotic -Antidiarrheal, as needed  We have prescribed you Tessalon Perles to take as needed for cough.  You will need to quarantine for at least 10 days from your symptom onset, or 10 days from the point of being fever free without the aid of Tylenol or ibuprofen, which ever is longer.  Please return to the ER for any new or worsening symptoms.

## 2019-03-14 NOTE — ED Provider Notes (Signed)
[facility] Emergency Department Provider Note  ____________________________________________   First MD Initiated Contact with Patient 03/14/19 1715     (approximate)  I have reviewed the triage vital signs and the nursing notes.  History  Chief Complaint Shortness of Breath    HPI Calvin Esparza is a 58 y.o. male history of paroxysmal A. fib, not on anticoagulation, who presents to the emergency department for generalized fatigue, body aches, shortness of breath, DOE.  States that he got short of breath walking to his mailbox which is unusual for him.  Symptoms have been present since Friday.  Constant, slightly worsening over the last few days. No alleviating/aggravating factors. No known sick contacts.  He denies any dizziness, lightheadedness, syncope, chest pain.   Past Medical Hx Past Medical History:  Diagnosis Date  . A-fib [facility])     Problem List Patient Active Problem List   Diagnosis Date Noted  . Atrial fibrillation ([place]) 10/01/2018  . Alcohol abuse 10/01/2018    Past Surgical Hx No past surgical history on file.  Medications Prior to Admission medications   Medication Sig Start Date End Date Taking? Authorizing Provider  metoprolol succinate (TOPROL-XL) 50 MG 24 hr tablet Take 1 tablet (50 mg total) by mouth daily. Take with or immediately following a meal. 10/04/18 01/02/19  Minna Merritts, MD    Allergies Patient has no known allergies.  Family Hx Family History  Problem Relation Age of Onset  . Bone cancer Father   . Stroke Father   . Diabetes Sister     Social Hx Social History   Tobacco Use  . Smoking status: Never Smoker  . Smokeless tobacco: Current User    Types: Chew  Substance Use Topics  . Alcohol use: Yes  . Drug use: Not Currently     Review of Systems  Constitutional: Negative for fever, chills. + Generalized weakness Eyes: Negative for visual changes. ENT: Negative for sore throat.  Cardiovascular: Negative for chest pain. Respiratory: + for shortness of breath, cough. Gastrointestinal: Negative for nausea, vomiting.  Genitourinary: Negative for dysuria. Musculoskeletal: Negative for leg swelling. + Body aches Skin: Negative for rash. Neurological: Negative for for headaches.   Physical Exam  Vital Signs: ED Triage Vitals [03/14/19 1229]  Enc Vitals Group     BP (!) 179/107     Pulse Rate (!) 106     Resp 16     Temp 98.8 F (37.1 C)     Temp Source Oral     SpO2 100 %     Weight 185 lb (83.9 kg)     Height 5\' 10"  (1.778 m)     Head Circumference      Peak Flow      Pain Score 0     Pain Loc      Pain Edu?      Excl. in [place]?     Constitutional: Alert and oriented. Appears fatigued. Head: Normocephalic. Atraumatic. Eyes: Conjunctivae clear. Sclera anicteric. Nose: No congestion. No rhinorrhea. Mouth/Throat: Wearing mask.  Neck: No stridor.   Cardiovascular: Normal rate, regular rhythm. Extremities well perfused. Respiratory: Normal respiratory effort.  Lungs CTAB. Speaking in full sentences. No accessory muscle use. 100% on RA. Gastrointestinal: Soft. Non-tender. Non-distended.  Musculoskeletal: No lower extremity edema. No deformities. Neurologic:  Normal speech and language. No gross focal neurologic deficits are appreciated.  Skin: Skin is warm, dry and intact. No rash noted. Psychiatric: Mood and affect are appropriate for situation.  EKG  Personally reviewed.   Rate: 110 Rhythm: sinus Axis: leftward Intervals: WNL Sinus tachycardia, no acute ischemic changes No STEMI    Radiology  CXR:  IMPRESSION:  No acute cardiopulmonary disease.    Procedures  Procedure(s) performed (including critical care):  Procedures   Initial Impression / Assessment and Plan / ED Course  58 y.o. male who presents to the ED for generalized fatigue, body aches, cough, shortness of breath, DOE.  As above.  Ddx: [id number], other pulmonary  infection, heart failure, other viral process  Patient is positive for COVID-19 on antigen testing, which is consistent with his symptomatology.  CXR negative.  Labs consistent with COVID with mild leukopenia, slight elevations in his AST/ALT/bilirubin.  EKG sinus tachycardia, no acute ischemic changes, troponin negative.  Patient hemodynamically stable, satting 100% on room air, no evidence of increased work of breathing or respiratory distress.  As such, he is stable for discharge. Given hx of paroxysmal AF, will advise a baby ASA daily, Tessalon Perles for cough, symptomatic care.  Discussed strict return precautions and the need for quarantining.  He voices understanding of this.   Final Clinical Impression(s) / ED Diagnosis  Final diagnoses:  COVID-19  Generalized weakness  SOB (shortness of breath)       Note:  This document was prepared using Dragon voice recognition software and may include unintentional dictation errors.   Lilia Pro., MD 03/14/19 [phone]

## 2019-03-14 NOTE — ED Provider Notes (Signed)
[facility] Emergency Department Provider Note  ____________________________________________   None    (approximate)   I have reviewed the triage vital signs and the nursing notes.   Patient has been triaged with a MSE exam performed by myself at a minimum. Based on symptoms and screening exam, patient may receive a more in-depth exam, labs, imaging as detailed below. Patients have been advised of this setting and exam type at the time of patient interview.    HISTORY  Chief Complaint No chief complaint on file.    HPI Calvin Esparza is a 58 y.o. male that presents to the emergency department with a complaint of shortness of breath for 2 days.  Patient states that he is short of breath walking to the mailbox or his truck.  He thought he might have had a chest cold.  He does have an intermittent cough.  He was sore a couple of days ago but this has resolved. He has a history of Afib.  No dizziness, chest pain.  Patient will receive a medical screening exam as detailed below.  Based off of this exam, more in depth exam, labs, imaging will be performed as needed for complaint.  Patient care will be eventually transferred to another provider in the emergency department for final exam, diagnosis and disposition.    No past medical history on file.  Patient Active Problem List   Diagnosis Date Noted  . Atrial fibrillation ([place]) 10/01/2018  . Alcohol abuse 10/01/2018    No past surgical history on file.  Prior to Admission medications   Medication Sig Start Date End Date Taking? Authorizing Provider  metoprolol succinate (TOPROL-XL) 50 MG 24 hr tablet Take 1 tablet (50 mg total) by mouth daily. Take with or immediately following a meal. 10/04/18 01/02/19  Minna Merritts, MD    Allergies Patient has no known allergies.  Family History  Problem Relation Age of Onset  . Bone cancer Father   . Stroke Father   . Diabetes Sister     Social  History Social History   Tobacco Use  . Smoking status: Never Smoker  . Smokeless tobacco: Current User    Types: Chew  Substance Use Topics  . Alcohol use: Yes  . Drug use: Not Currently    Review of Systems Constitutional: No fever ENT: No nasal congestion/rhinorhea. No sore throat Cardiovascular: No chest pain. Respiratory: Intermittent cough. Positive shortness of breath/difficulty breathing Gastroenterology: No abdominal pain Musculoskeletal: Positive for soreness a couple of days ago. Integumentary: Negative for rash. Neurological: No focal weakness nor numbness.   ____________________________________________   PHYSICAL EXAM:  VITAL SIGNS: ED Triage Vitals  Enc Vitals Group     BP      Pulse      Resp      Temp      Temp src      SpO2      Weight      Height      Head Circumference      Peak Flow      Pain Score      Pain Loc      Pain Edu?      Excl. in [place]?     Constitutional: Alert and oriented. Generally well appearing. Eyes: Conjunctivae are normal.  Cardiovascular: Grossly normal heart sounds. Respiratory: Increased respiratory effort without significant tachypnea and no observed retractions.  Gastrointestinal: No significant visible abdominal wall findings.   Musculoskeletal: No gross deformities of extremities.  Neurologic:  Normal speech and language. No gross focal neurologic deficits are appreciated.  Skin:  Skin is warm, dry and intact. No rash noted.    ____________________________________________   LABS (all labs ordered are listed, but only abnormal results are displayed)  Labs Reviewed - No data to display  ____________________________________________   RADIOLOGY   Official radiology report(s): No results found.  ____________________________________________    INITIAL IMPRESSION / MDM / ASSESSMENT AND PLAN / ED COURSE    Patient does have some increased respiratory effort but is saturating at 100%.  He overall  appears well.  Basic labs, troponin, BNP, chest x-ray, Covid, EKG were ordered.  Patient has been screened based based on their arrival complaint, evaluated for an emergent condition, and at a minimum has received a medical screening exam.  At this time, patient will receive further work-up as determined by medical screening exam.  Patient care will eventually be transferred to another provider in the emergency department for final diagnosis and disposition.    ____________________________________________  Note:  This document was prepared using Systems analyst and may include unintentional dictation errors.   Laban Emperor, PA-C 03/14/19 1505    Lavonia Drafts, MD 03/14/19 [phone]

## 2019-03-14 NOTE — ED Triage Notes (Signed)
SOB without chest pain, cough or fever x 2 days. SOB worse when walking.

## 2019-06-07 ENCOUNTER — Emergency Department: Payer: 59

## 2019-06-07 ENCOUNTER — Encounter: Payer: Self-pay | Admitting: Emergency Medicine

## 2019-06-07 ENCOUNTER — Other Ambulatory Visit: Payer: Self-pay

## 2019-06-07 ENCOUNTER — Inpatient Hospital Stay: Payer: 59

## 2019-06-07 ENCOUNTER — Inpatient Hospital Stay
Admission: EM | Admit: 2019-06-07 | Discharge: 2019-06-11 | DRG: 176 | Disposition: A | Discharge status: Home / Self Care | Payer: 59 | Attending: Internal Medicine | Admitting: Internal Medicine

## 2019-06-07 DIAGNOSIS — F1020 Alcohol dependence, uncomplicated: Secondary | ICD-10-CM | POA: Diagnosis not present

## 2019-06-07 DIAGNOSIS — I4819 Other persistent atrial fibrillation: Secondary | ICD-10-CM | POA: Diagnosis present

## 2019-06-07 DIAGNOSIS — F1010 Alcohol abuse, uncomplicated: Secondary | ICD-10-CM | POA: Diagnosis present

## 2019-06-07 DIAGNOSIS — I5042 Chronic combined systolic (congestive) and diastolic (congestive) heart failure: Secondary | ICD-10-CM | POA: Diagnosis present

## 2019-06-07 DIAGNOSIS — I420 Dilated cardiomyopathy: Secondary | ICD-10-CM | POA: Diagnosis present

## 2019-06-07 DIAGNOSIS — Z833 Family history of diabetes mellitus: Secondary | ICD-10-CM

## 2019-06-07 DIAGNOSIS — I2699 Other pulmonary embolism without acute cor pulmonale: Principal | ICD-10-CM | POA: Diagnosis present

## 2019-06-07 DIAGNOSIS — Z823 Family history of stroke: Secondary | ICD-10-CM | POA: Diagnosis not present

## 2019-06-07 DIAGNOSIS — I2693 Single subsegmental pulmonary embolism without acute cor pulmonale: Secondary | ICD-10-CM

## 2019-06-07 DIAGNOSIS — Z8616 Personal history of COVID-19: Secondary | ICD-10-CM

## 2019-06-07 DIAGNOSIS — I4891 Unspecified atrial fibrillation: Secondary | ICD-10-CM | POA: Diagnosis present

## 2019-06-07 DIAGNOSIS — E876 Hypokalemia: Secondary | ICD-10-CM | POA: Diagnosis present

## 2019-06-07 DIAGNOSIS — I428 Other cardiomyopathies: Secondary | ICD-10-CM

## 2019-06-07 DIAGNOSIS — I2690 Septic pulmonary embolism without acute cor pulmonale: Secondary | ICD-10-CM

## 2019-06-07 DIAGNOSIS — F17220 Nicotine dependence, chewing tobacco, uncomplicated: Secondary | ICD-10-CM | POA: Diagnosis present

## 2019-06-07 HISTORY — DX: COVID-19: U07.1

## 2019-06-07 HISTORY — DX: Other pulmonary embolism without acute cor pulmonale: I26.99

## 2019-06-07 HISTORY — DX: Other chronic pain: G89.29

## 2019-06-07 HISTORY — DX: Alcohol abuse, uncomplicated: F10.10

## 2019-06-07 LAB — BASIC METABOLIC PANEL
Anion gap: 13 (ref 5–15)
BUN: 15 mg/dL (ref 6–20)
CO2: 24 mmol/L (ref 22–32)
Calcium: 9.9 mg/dL (ref 8.9–10.3)
Chloride: 103 mmol/L (ref 98–111)
Creatinine, Ser: 1.12 mg/dL (ref 0.61–1.24)
GFR calc Af Amer: >60 mL/min (ref >60)
GFR calc non Af Amer: >60 mL/min (ref >60)
Glucose, Bld: 125 mg/dL — ABNORMAL HIGH (ref 70–99)
Potassium: 3.6 mmol/L (ref 3.5–5.1)
Sodium: 140 mmol/L (ref 135–145)

## 2019-06-07 LAB — HEPATIC FUNCTION PANEL
ALT: 24 U/L (ref 0–44)
AST: 31 U/L (ref 15–41)
Albumin: 4.6 g/dL (ref 3.5–5.0)
Alkaline Phosphatase: 59 U/L (ref 38–126)
Bilirubin, Direct: 0.2 mg/dL (ref 0.0–0.2)
Indirect Bilirubin: 1.3 mg/dL — ABNORMAL HIGH (ref 0.3–0.9)
Total Bilirubin: 1.5 mg/dL — ABNORMAL HIGH (ref 0.3–1.2)
Total Protein: 8.2 g/dL — ABNORMAL HIGH (ref 6.5–8.1)

## 2019-06-07 LAB — TROPONIN I (HIGH SENSITIVITY)
Troponin I (High Sensitivity): 8 ng/L (ref <18)
Troponin I (High Sensitivity): 8 ng/L (ref <18)

## 2019-06-07 LAB — CBC
HCT: 47.7 % (ref 39.0–52.0)
Hemoglobin: 15.7 g/dL (ref 13.0–17.0)
MCH: 31.5 pg (ref 26.0–34.0)
MCHC: 32.9 g/dL (ref 30.0–36.0)
MCV: 95.6 fL (ref 80.0–100.0)
Platelets: 197 10*3/uL (ref 150–400)
RBC: 4.99 MIL/uL (ref 4.22–5.81)
RDW: 14.1 % (ref 11.5–15.5)
WBC: 6.3 10*3/uL (ref 4.0–10.5)
nRBC: 0 % (ref 0.0–0.2)

## 2019-06-07 LAB — T4, FREE: Free T4: 0.96 ng/dL (ref 0.61–1.12)

## 2019-06-07 LAB — TSH
TSH: 3.51 u[IU]/mL (ref 0.350–4.500)
TSH: 1.823 u[IU]/mL (ref 0.350–4.500)

## 2019-06-07 LAB — PROTIME-INR
INR: 1 (ref 0.8–1.2)
Prothrombin Time: 12.8 seconds (ref 11.4–15.2)

## 2019-06-07 LAB — ETHANOL: Alcohol, Ethyl (B): <10 mg/dL (ref <10)

## 2019-06-07 LAB — APTT: aPTT: 26 seconds (ref 24–36)

## 2019-06-07 LAB — MAGNESIUM: Magnesium: 2 mg/dL (ref 1.7–2.4)

## 2019-06-07 MED ORDER — IOHEXOL 350 MG/ML SOLN
75.0000 mL | Freq: Once | INTRAVENOUS | Status: AC | PRN
  Administered 2019-06-07: 75 mL via INTRAVENOUS

## 2019-06-07 MED ORDER — HEPARIN (PORCINE) 25000 UT/250ML-% IV SOLN
1200.0000 [IU]/h | INTRAVENOUS | Status: DC
  Administered 2019-06-07: 1400 [IU]/h via INTRAVENOUS
  Filled 2019-06-07 (×2): qty 250

## 2019-06-07 MED ORDER — LORAZEPAM 2 MG/ML IJ SOLN
1.0000 mg | INTRAMUSCULAR | Status: DC | PRN
  Administered 2019-06-07 – 2019-06-10 (×7): 1 mg via INTRAVENOUS
  Filled 2019-06-07 (×7): qty 1

## 2019-06-07 MED ORDER — ONDANSETRON HCL 4 MG/2ML IJ SOLN: 4.0000 mg | Freq: Four times a day (QID) | INTRAMUSCULAR | Status: DC | PRN

## 2019-06-07 MED ORDER — DILTIAZEM HCL 25 MG/5ML IV SOLN
15.0000 mg | Freq: Once | INTRAVENOUS | Status: AC
  Administered 2019-06-07: 15 mg via INTRAVENOUS
  Filled 2019-06-07: qty 5

## 2019-06-07 MED ORDER — DILTIAZEM HCL-DEXTROSE 125-5 MG/125ML-% IV SOLN (PREMIX): 5.0000 mg/h | INTRAVENOUS | Status: DC

## 2019-06-07 MED ORDER — ZOLPIDEM TARTRATE 5 MG PO TABS: 5.0000 mg | ORAL_TABLET | Freq: Every evening | ORAL | Status: DC | PRN

## 2019-06-07 MED ORDER — METOPROLOL TARTRATE 5 MG/5ML IV SOLN: 2.5000 mg | INTRAVENOUS | Status: AC

## 2019-06-07 MED ORDER — ALPRAZOLAM 0.5 MG PO TABS: 0.2500 mg | ORAL_TABLET | Freq: Two times a day (BID) | ORAL | Status: DC | PRN

## 2019-06-07 MED ORDER — ACETAMINOPHEN 325 MG PO TABS
650.0000 mg | ORAL_TABLET | ORAL | Status: DC | PRN
  Administered 2019-06-07 – 2019-06-10 (×6): 650 mg via ORAL
  Filled 2019-06-07 (×6): qty 2

## 2019-06-07 MED ORDER — SODIUM CHLORIDE 0.9 % IV SOLN: INTRAVENOUS | Status: DC

## 2019-06-07 MED ORDER — METOPROLOL TARTRATE 25 MG PO TABS
25.0000 mg | ORAL_TABLET | Freq: Two times a day (BID) | ORAL | Status: DC
  Administered 2019-06-07 – 2019-06-08 (×3): 25 mg via ORAL
  Filled 2019-06-07 (×2): qty 1

## 2019-06-07 MED ORDER — APIXABAN 5 MG PO TABS: 5.0000 mg | ORAL_TABLET | Freq: Two times a day (BID) | ORAL | Status: DC

## 2019-06-07 MED ORDER — HEPARIN BOLUS VIA INFUSION
4000.0000 [IU] | Freq: Once | INTRAVENOUS | Status: AC
  Administered 2019-06-07: 4000 [IU] via INTRAVENOUS
  Filled 2019-06-07: qty 4000

## 2019-06-07 MED ORDER — METOPROLOL TARTRATE 25 MG PO TABS
25.0000 mg | ORAL_TABLET | Freq: Two times a day (BID) | ORAL | Status: DC
  Filled 2019-06-07: qty 1

## 2019-06-07 MED ORDER — DILTIAZEM HCL 25 MG/5ML IV SOLN
20.0000 mg | Freq: Once | INTRAVENOUS | Status: AC
  Administered 2019-06-07: 20 mg via INTRAVENOUS
  Filled 2019-06-07: qty 5

## 2019-06-07 MED ORDER — DILTIAZEM HCL 25 MG/5ML IV SOLN
10.0000 mg | Freq: Once | INTRAVENOUS | Status: AC
  Administered 2019-06-07: 10 mg via INTRAVENOUS
  Filled 2019-06-07: qty 5

## 2019-06-07 NOTE — ED Triage Notes (Signed)
Pt reports that he has been out of breath for the past couple days, pt states that he has had a.fib once in the past, states when he came in for similar symptoms in dec and was diagnosed with covid. Pt went to urgent care and was told heart rate was elevated. dizzy

## 2019-06-07 NOTE — H&P (Signed)
[facility] NAME: Calvin Esparza    MR#:  [id number]  DATE OF BIRTH:  June 04, 1960  DATE OF ADMISSION:  06/07/2019  PRIMARY CARE PHYSICIAN: [name], MD   REQUESTING/REFERRING PHYSICIAN: Marjean Donna, MD CHIEF COMPLAINT:   Chief Complaint  Patient presents with  . Tachycardia  . Chest Pain  . Shortness of Breath    HISTORY OF PRESENT ILLNESS:  Calvin Esparza  is a 59 y.o. Caucasian male with a known history of COVID-19 in December 2020, presented to the emergency room with acute onset of worsening dyspnea over the last couple of days with associated palpitations.  He stated that he was placed on p.o. Lopressor and Eliquis when he had COVID-19 in late December and stopped them after 1 month of therapy.  He admitted to mild chills and diaphoresis but denied any chest pain.  He denied any cough but admitted to mild wheezing.  No nausea or vomiting or diarrhea.  No headache or dizziness or blurred vision.  No paresthesias or focal muscle weakness.  He drinks 4-5 beer per night and occasionally up to 6-8 beer.  Upon presentation to the emergency room, blood pressure was 155/105 with a pulse of 128 respirate of 23 with pulse oximetry 97% on room air.  Labs revealed a potassium of 3.4 and magnesium 2 with unremarkable CMP.  High-sensitivity troponin I was 8 and later 8 and CBC was unremarkable.  TSH came back 3.5 and free T4 0.96.  Alcohol levels less than 10.  Chest x-ray showed no acute cardiopulmonary disease and EKG showed atrial fibrillation with rapid response of 151. Chest CTA revealed a mild amount of pulmonary embolism within a lower lobe branch of the right pulmonary artery.  We started the patient on IV heparin with bolus and drip.  He was given 2 boluses of IV diltiazem, 15 and 20 with improvement of heart rate to the 90s and later on went up again to 140s.  He will be admitted to a telemetry bed for further evaluation and management. PAST  MEDICAL HISTORY:   Past Medical History:  Diagnosis Date  . A-fib ([place])   COVID-19 in December 2020  PAST SURGICAL HISTORY:  History reviewed. No pertinent surgical history.  He denies any previous surgeries  SOCIAL HISTORY:   Social History   Tobacco Use  . Smoking status: Never Smoker  . Smokeless tobacco: Current User    Types: Chew  Substance Use Topics  . Alcohol use: Yes    FAMILY HISTORY:   Family History  Problem Relation Age of Onset  . Bone cancer Father   . Stroke Father   . Diabetes Sister     DRUG ALLERGIES:  No Known Allergies  REVIEW OF SYSTEMS:   ROS As per history of present illness. All pertinent systems were reviewed above. Constitutional,  HEENT, cardiovascular, respiratory, GI, GU, musculoskeletal, neuro, psychiatric, endocrine,  integumentary and hematologic systems were reviewed and are otherwise  negative/unremarkable except for positive findings mentioned above in the HPI.   MEDICATIONS AT HOME:   Prior to Admission medications   Not on File      VITAL SIGNS:  Blood pressure 124/86, pulse (!) 136, temperature 99.6 F (37.6 C), temperature source Oral, resp. rate 20, height 5\' 10"  (1.778 m), weight 87.5 kg, SpO2 95 %.  PHYSICAL EXAMINATION:  Physical Exam  GENERAL:  59 y.o.-year-old Caucasian male patient lying in the bed with no acute distress.  EYES:  Pupils equal, round, reactive to light and accommodation. No scleral icterus. Extraocular muscles intact.  HEENT: Head atraumatic, normocephalic. Oropharynx and nasopharynx clear.  NECK:  Supple, no jugular venous distention. No thyroid enlargement, no tenderness.  LUNGS: Normal breath sounds bilaterally, no wheezing, rales,rhonchi or crepitation. No use of accessory muscles of respiration.  CARDIOVASCULAR: Irregularly irregular slightly tachycardic rhythm, S1, S2 normal. No murmurs, rubs, or gallops.  ABDOMEN: Soft, nondistended, nontender. Bowel sounds present. No organomegaly or  mass.  EXTREMITIES: No pedal edema, cyanosis, or clubbing.  NEUROLOGIC: Cranial nerves II through XII are intact. Muscle strength 5/5 in all extremities. Sensation intact. Gait not checked.  PSYCHIATRIC: The patient is alert and oriented x 3.  Normal affect and good eye contact. SKIN: No obvious rash, lesion, or ulcer.   LABORATORY PANEL:   CBC Recent Labs  Lab 06/07/19 1636  WBC 6.3  HGB 15.7  HCT 47.7  PLT 197   ------------------------------------------------------------------------------------------------------------------  Chemistries  Recent Labs  Lab 06/07/19 1636 06/07/19 1647  NA 140  --   K 3.6  --   CL 103  --   CO2 24  --   GLUCOSE 125*  --   BUN 15  --   CREATININE 1.12  --   CALCIUM 9.9  --   MG  --  2.0  AST 31  --   ALT 24  --   ALKPHOS 59  --   BILITOT 1.5*  --    ------------------------------------------------------------------------------------------------------------------  Cardiac Enzymes No results for input(s): TROPONINI in the last 168 hours. ------------------------------------------------------------------------------------------------------------------  RADIOLOGY:  DG Chest 2 View  Result Date: 06/07/2019 CLINICAL DATA:  Shortness of breath. EXAM: CHEST - 2 VIEW COMPARISON:  March 14, 2019 FINDINGS: The heart size and mediastinal contours are within normal limits. Both lungs are clear. A chronic seventh right rib fracture is seen. Degenerative changes seen throughout the thoracic spine. IMPRESSION: No active cardiopulmonary disease. Electronically Signed   By: Virgina Norfolk M.D.   On: 06/07/2019 17:26   CT Angio Chest PE W and/or Wo Contrast  Result Date: 06/07/2019 CLINICAL DATA:  Shortness of breath. EXAM: CT ANGIOGRAPHY CHEST WITH CONTRAST TECHNIQUE: Multidetector CT imaging of the chest was performed using the standard protocol during bolus administration of intravenous contrast. Multiplanar CT image reconstructions and MIPs  were obtained to evaluate the vascular anatomy. CONTRAST:  76mL OMNIPAQUE IOHEXOL 350 MG/ML SOLN COMPARISON:  None. FINDINGS: Cardiovascular: Satisfactory opacification of the pulmonary arteries to the segmental level. A mild amount of intraluminal low attenuation is seen within a lower lobe branch of the right pulmonary artery (axial CT images 52 through 54, CT series number 4/coronal reformatted images 54 through 56, CT series number 7). Normal heart size. No pericardial effusion. Mediastinum/Nodes: No enlarged mediastinal, hilar, or axillary lymph nodes. Thyroid gland, trachea, and esophagus demonstrate no significant findings. Lungs/Pleura: Lungs are clear. No pleural effusion or pneumothorax. Upper Abdomen: No acute abnormality. Musculoskeletal: No chest wall abnormality. No acute or significant osseous findings. Review of the MIP images confirms the above findings. IMPRESSION: A mild amount of pulmonary embolism within a lower lobe branch of the right pulmonary artery. Electronically Signed   By: Virgina Norfolk M.D.   On: 06/07/2019 18:42   US Venous Img Lower Bilateral (DVT)  Result Date: 06/07/2019 CLINICAL DATA:  Initial evaluation for pulmonary embolus. EXAM: BILATERAL LOWER EXTREMITY VENOUS DOPPLER ULTRASOUND TECHNIQUE: Gray-scale sonography with graded compression, as well as color Doppler and duplex ultrasound were performed to evaluate the lower  extremity deep venous systems from the level of the common femoral vein and including the common femoral, femoral, profunda femoral, popliteal and calf veins including the posterior tibial, peroneal and gastrocnemius veins when visible. The superficial great saphenous vein was also interrogated. Spectral Doppler was utilized to evaluate flow at rest and with distal augmentation maneuvers in the common femoral, femoral and popliteal veins. COMPARISON:  Prior CTA of the chest from earlier the same day. FINDINGS: RIGHT LOWER EXTREMITY Common Femoral Vein:  No evidence of thrombus. Normal compressibility, respiratory phasicity and response to augmentation. Saphenofemoral Junction: No evidence of thrombus. Normal compressibility and flow on color Doppler imaging. Profunda Femoral Vein: No evidence of thrombus. Normal compressibility and flow on color Doppler imaging. Femoral Vein: No evidence of thrombus. Normal compressibility, respiratory phasicity and response to augmentation. Popliteal Vein: No evidence of thrombus. Normal compressibility, respiratory phasicity and response to augmentation. Calf Veins: No evidence of thrombus. Normal compressibility and flow on color Doppler imaging. Superficial Great Saphenous Vein: No evidence of thrombus. Normal compressibility. Venous Reflux:  None. Other Findings:  None. LEFT LOWER EXTREMITY Common Femoral Vein: No evidence of thrombus. Normal compressibility, respiratory phasicity and response to augmentation. Saphenofemoral Junction: No evidence of thrombus. Normal compressibility and flow on color Doppler imaging. Profunda Femoral Vein: No evidence of thrombus. Normal compressibility and flow on color Doppler imaging. Femoral Vein: No evidence of thrombus. Normal compressibility, respiratory phasicity and response to augmentation. Popliteal Vein: No evidence of thrombus. Normal compressibility, respiratory phasicity and response to augmentation. Calf Veins: No evidence of thrombus. Normal compressibility and flow on color Doppler imaging. Superficial Great Saphenous Vein: No evidence of thrombus. Normal compressibility. Venous Reflux:  None. Other Findings: Multiple thrombosed superficial varicose veins seen within the medial left lower leg just below the knee. IMPRESSION: 1. No evidence of deep venous thrombosis in either lower extremity. 2. Multiple thrombosed superficial varicose veins at the medial aspect of the left lower leg. Electronically Signed   By: Jeannine Boga M.D.   On: 06/07/2019 22:58       IMPRESSION AND PLAN:   1.  Acute pulmonary embolism.  This could be a complication of [id number] which she had in December 2020. -The patient will be admitted to telemetry bed. -He will be placed on IV heparin drip. -A 2D echo will be obtained to assess for right ventricular strain though it is unlikely. -We will obtain bilateral lower extremity venous duplex to rule out DVT.  2.  Atrial fibrillation with rapid ventricular response. -We will give the patient an extra bolus of IV diltiazem and placed on IV diltiazem drip.  3.  Alcohol abuse. -The patient will be placed on as needed IV Ativan and a banana bag daily.  4.  DVT prophylaxis. -The patient will be placed on IV heparin drip as mentioned above.    All the records are reviewed and case discussed with ED provider. The plan of care was discussed in details with the patient (and family). I answered all questions. The patient agreed to proceed with the above mentioned plan. Further management will depend upon hospital course.   CODE STATUS: Full code  TOTAL TIME TAKING CARE OF THIS PATIENT: 50 minutes.    Christel Mormon M.D on 06/07/2019 at 11:18 PM  [facility]   From 7 PM-7 AM, contact night-coverage [email or web address]  CC: Primary care physician; [name], MD   Note: This dictation was prepared with Dragon dictation along with smaller phrase technology. Any transcriptional errors that  result from this process are unintentional.

## 2019-06-07 NOTE — ED Notes (Signed)
See triage note. Pt reports mid-sternal chest pain and SOB for several days. States feels similar to when he had covid in December 2020. HR 150s-160s, hx afib. Does not take any medications. Daily ETOH use 7-8 drinks a day per pt, mix of liquor and beer. Last drink was Sunday night.  Pt states no prior history of ETOH withdrawal seizures or DTs.

## 2019-06-07 NOTE — Consult Note (Signed)
ANTICOAGULATION CONSULT NOTE - Initial Consult  Pharmacy Consult for Heparin infusion Indication: pulmonary embolus  No Known Allergies  Patient Measurements: Height: 5\' 10"  (177.8 cm) Weight: 190 lb (86.2 kg) IBW/kg (Calculated) : 73 Heparin Dosing Weight: 86.2 kg  Vital Signs: Temp: 98 F (36.7 C) (03/23 1631) Temp Source: Oral (03/23 1631) BP: 122/91 (03/23 1900) Pulse Rate: 95 (03/23 1909)  Labs: Recent Labs    06/07/19 1636 06/07/19 1830  HGB 15.7  --   HCT 47.7  --   PLT 197  --   APTT 26  --   LABPROT 12.8  --   INR 1.0  --   CREATININE 1.12  --   TROPONINIHS 8 8    Estimated Creatinine Clearance: 75.1 mL/min (by C-G formula based on SCr of 1.12 mg/dL).   Medical History: Past Medical History:  Diagnosis Date  . A-fib ([place])     Medications:  No anticoagulation prior to admission. Hx of rivaroxaban in the past but not taking.   Assessment: Patient is a 59 y/o M with a medical history including atrial fibrillation and alcohol abuse who presented with shortness of breath and mid-sternal chest pain for several days. CTA positive for PE. Pharmacy consulted to initiate heparin infusion.  Baseline H&H within normal limits. Baseline coags within normal limits.   Goal of Therapy:  Heparin level 0.3-0.7 units/ml Monitor platelets by anticoagulation protocol: Yes   Plan:  -Heparin 4000 unit IV bolus x 1 followed by continuous infusion at 1400 units/hr -Heparin level 6 hours after initiation of infusion -Daily CBC per protocol  [place] Resident 06/07/2019,7:33 PM

## 2019-06-07 NOTE — ED Provider Notes (Signed)
[facility] Emergency Department Provider Note  ____________________________________________   First MD Initiated Contact with Patient 06/07/19 1637     (approximate)  I have reviewed the triage vital signs and the nursing notes.   HISTORY  Chief Complaint Tachycardia, Chest Pain, and Shortness of Breath    HPI Calvin Esparza is a 59 y.o. male with history of A. fib and alcohol use who comes in with concerns for shortness of breath.  Patient was seen in June 2020 with shortness of breath and palpitations.  Patient was discharged on Xarelto and metoprolol and supposed to follow-up with cardiology but he never did.  Patient was then seen in December 2020 and diagnosed with COVID-19.  Patient states that he is has felt short of breath since his COVID-19 but he knows of the past 2 days that he was more short of breath that was severe, constant, felt similar to June 2020.  Nothing made it better, worse with exertion.  Not really have any chest pain with it.  Patient was told that he had a fast heart rate and told to come to the ER for further evaluation.  To note patient does drink daily but last drink 2 days ago.  Patient denies history of withdrawal seizures.  Patient maybe has a slight tremor but does not feel like he is withdrawing.  Denies any history of PE, heart surgeries, unilateral leg swelling.     Past Medical History:  Diagnosis Date  . A-fib [facility])     Patient Active Problem List   Diagnosis Date Noted  . Atrial fibrillation ([place]) 10/01/2018  . Alcohol abuse 10/01/2018    History reviewed. No pertinent surgical history.  Prior to Admission medications   Medication Sig Start Date End Date Taking? Authorizing Provider  benzonatate (TESSALON PERLES) 100 MG capsule Take 1 capsule (100 mg total) by mouth 3 (three) times daily as needed for cough. 03/14/19 03/13/20  Lilia Pro., MD  metoprolol succinate (TOPROL-XL) 50 MG 24 hr tablet Take  1 tablet (50 mg total) by mouth daily. Take with or immediately following a meal. 10/04/18 01/02/19  Minna Merritts, MD    Allergies Patient has no known allergies.  Family History  Problem Relation Age of Onset  . Bone cancer Father   . Stroke Father   . Diabetes Sister     Social History Social History   Tobacco Use  . Smoking status: Never Smoker  . Smokeless tobacco: Current User    Types: Chew  Substance Use Topics  . Alcohol use: Yes  . Drug use: Not Currently      Review of Systems Constitutional: No fever/chills Eyes: No visual changes. ENT: No sore throat. Cardiovascular: No chest pain Respiratory: Positive for SOB Gastrointestinal: No abdominal pain.  No nausea, no vomiting.  No diarrhea.  No constipation. Genitourinary: Negative for dysuria. Musculoskeletal: Negative for back pain. Skin: Negative for rash. Neurological: Negative for headaches, focal weakness or numbness. All other ROS negative ____________________________________________   PHYSICAL EXAM:  VITAL SIGNS: ED Triage Vitals [06/07/19 1631]  Enc Vitals Group     BP (!) 128/92     Pulse Rate 76     Resp 20     Temp 98 F (36.7 C)     Temp Source Oral     SpO2 97 %     Weight 190 lb (86.2 kg)     Height 5\' 10"  (1.778 m)     Head Circumference  Peak Flow      Pain Score 7     Pain Loc      Pain Edu?      Excl. in [place]?     Constitutional: Alert and oriented. Well appearing and in no acute distress. Eyes: Conjunctivae are normal. EOMI. Head: Atraumatic. Nose: No congestion/rhinnorhea. Mouth/Throat: Mucous membranes are moist.   Neck: No stridor. Trachea Midline. FROM Cardiovascular: Irregular, fast rhythm grossly normal heart sounds.  Good peripheral circulation. Respiratory: No increased work of breathing, no stridor Gastrointestinal: Soft and nontender. No distention. No abdominal bruits.  Musculoskeletal: No lower extremity tenderness nor edema.  No joint  effusions. Neurologic:  Normal speech and language. No gross focal neurologic deficits are appreciated.  Skin:  Skin is warm, dry and intact. No rash noted. Psychiatric: Mood and affect are normal. Speech and behavior are normal. GU: Deferred   ____________________________________________   LABS (all labs ordered are listed, but only abnormal results are displayed)  Labs Reviewed  BASIC METABOLIC PANEL - Abnormal; Notable for the following components:      Result Value   Glucose, Bld 125 (*)    All other components within normal limits  HEPATIC FUNCTION PANEL - Abnormal; Notable for the following components:   Total Protein 8.2 (*)    Total Bilirubin 1.5 (*)    Indirect Bilirubin 1.3 (*)    All other components within normal limits  CBC  PROTIME-INR  APTT  TSH  T4, FREE  MAGNESIUM  ETHANOL  TROPONIN I (HIGH SENSITIVITY)  TROPONIN I (HIGH SENSITIVITY)   ____________________________________________   ED ECG REPORT I, Vanessa [place], the attending physician, personally viewed and interpreted this ECG.  EKG was irregular, tachycardic, consistent with atrial fibrillation, no ST elevation, no T inversions, normal intervals ____________________________________________  RADIOLOGY Robert Bellow, personally viewed and evaluated these images (plain radiographs) as part of my medical decision making, as well as reviewing the written report by the radiologist.  ED MD interpretation: No pneumonia  Official radiology report(s): DG Chest 2 View  Result Date: 06/07/2019 CLINICAL DATA:  Shortness of breath. EXAM: CHEST - 2 VIEW COMPARISON:  March 14, 2019 FINDINGS: The heart size and mediastinal contours are within normal limits. Both lungs are clear. A chronic seventh right rib fracture is seen. Degenerative changes seen throughout the thoracic spine. IMPRESSION: No active cardiopulmonary disease. Electronically Signed   By: Virgina Norfolk M.D.   On: 06/07/2019 17:26     ____________________________________________   PROCEDURES  Procedure(s) performed (including Critical Care):  .Critical Care Performed by: Vanessa Adams, MD Authorized by: Vanessa [place], MD   Critical care provider statement:    Critical care time (minutes):  45   Critical care was necessary to treat or prevent imminent or life-threatening deterioration of the following conditions:  Cardiac failure   Critical care was time spent personally by me on the following activities:  Discussions with consultants, evaluation of patient's response to treatment, examination of patient, ordering and performing treatments and interventions, ordering and review of laboratory studies, ordering and review of radiographic studies, pulse oximetry, re-evaluation of patient's condition, obtaining history from patient or surrogate and review of old charts     ____________________________________________   INITIAL IMPRESSION / ASSESSMENT AND PLAN / ED COURSE   Calvin Esparza was evaluated in Emergency Department on 06/07/2019 for the symptoms described in the history of present illness. He was evaluated in the context of the global COVID-19 pandemic, which necessitated consideration that  the patient might be at risk for infection with the SARS-CoV-2 virus that causes COVID-19. Institutional protocols and algorithms that pertain to the evaluation of patients at risk for COVID-19 are in a state of rapid change based on information released by regulatory bodies including the CDC and federal and state organizations. These policies and algorithms were followed during the patient's care in the ED.     Pt presents with SOB.  Patient noted to be in A. fib with RVR.  Patient was given IV diltiazem.  Patient does not appear to be acutely withdrawing.  PNA-will get xray to evaluation Anemia-CBC to evaluate ACS- will get trops Arrhythmia-Will get EKG and keep on monitor.  PE-given low sats and continued SOB  since covid will get CT PE to rule out  Covid.  Patient does not require repeat testing given he is less than 3 months out   Reevaluated patient heart rates are in the 120s to 125.  We will give another dose of IV diltiazem.  Patient does state that he has not been taking the metoprolol that was prescribed to him.  He was not aware that he was supposed to follow-up with cardiology.   CT imaging concerning for mild amount of PE within the lower lobe branch of the right pulmonary artery.  No right heart strain.  Will discuss with the hospital team for admission for A. fib with RVR as well as new PE.  Discussed with the hospitalist who requested patient be started on heparin.  Patient denies any risk of bleeding.  Also give patient some metoprolol since that is what he was supposed to be on previously should keep his heart rates down.  Heart rates have been less than 90s.  Patient will be admitted to the hospital for further work-up   ____________________________________________   FINAL CLINICAL IMPRESSION(S) / ED DIAGNOSES   Final diagnoses:  Atrial fibrillation with rapid ventricular response ([place])  Single subsegmental pulmonary embolism without acute cor pulmonale ([facility])     MEDICATIONS GIVEN DURING THIS VISIT:  Medications  metoprolol tartrate (LOPRESSOR) tablet 25 mg (has no administration in time range)  heparin bolus via infusion 4,000 Units (has no administration in time range)  heparin ADULT infusion 100 units/mL (25000 units/267mL sodium chloride 0.45%) (has no administration in time range)  diltiazem (CARDIZEM) injection 15 mg (15 mg Intravenous Given 06/07/19 1651)  diltiazem (CARDIZEM) injection 20 mg (20 mg Intravenous Given 06/07/19 1830)  iohexol (OMNIPAQUE) 350 MG/ML injection 75 mL (75 mLs Intravenous Contrast Given 06/07/19 1811)     ED Discharge Orders    None       Note:  This document was prepared using Dragon voice recognition software and may include  unintentional dictation errors.   Vanessa Rossiter, MD 06/07/19 [id number]

## 2019-06-08 ENCOUNTER — Inpatient Hospital Stay (HOSPITAL_COMMUNITY)
Admit: 2019-06-08 | Discharge: 2019-06-08 | Disposition: A | Discharge status: Home / Self Care | Payer: 59 | Attending: Family Medicine | Admitting: Family Medicine

## 2019-06-08 DIAGNOSIS — I5042 Chronic combined systolic (congestive) and diastolic (congestive) heart failure: Secondary | ICD-10-CM | POA: Insufficient documentation

## 2019-06-08 DIAGNOSIS — I2699 Other pulmonary embolism without acute cor pulmonale: Secondary | ICD-10-CM

## 2019-06-08 DIAGNOSIS — R0602 Shortness of breath: Secondary | ICD-10-CM

## 2019-06-08 DIAGNOSIS — I5043 Acute on chronic combined systolic (congestive) and diastolic (congestive) heart failure: Secondary | ICD-10-CM | POA: Insufficient documentation

## 2019-06-08 DIAGNOSIS — R079 Chest pain, unspecified: Secondary | ICD-10-CM

## 2019-06-08 LAB — CBC WITH DIFFERENTIAL/PLATELET
Abs Immature Granulocytes: 0.01 10*3/uL (ref 0.00–0.07)
Basophils Absolute: 0 10*3/uL (ref 0.0–0.1)
Basophils Relative: 1 %
Eosinophils Absolute: 0.1 10*3/uL (ref 0.0–0.5)
Eosinophils Relative: 2 %
HCT: 41 % (ref 39.0–52.0)
Hemoglobin: 13.7 g/dL (ref 13.0–17.0)
Immature Granulocytes: 0 %
Lymphocytes Relative: 35 %
Lymphs Abs: 1.9 10*3/uL (ref 0.7–4.0)
MCH: 31.7 pg (ref 26.0–34.0)
MCHC: 33.4 g/dL (ref 30.0–36.0)
MCV: 94.9 fL (ref 80.0–100.0)
Monocytes Absolute: 0.4 10*3/uL (ref 0.1–1.0)
Monocytes Relative: 7 %
Neutro Abs: 3 10*3/uL (ref 1.7–7.7)
Neutrophils Relative %: 55 %
Platelets: 167 10*3/uL (ref 150–400)
RBC: 4.32 MIL/uL (ref 4.22–5.81)
RDW: 13.7 % (ref 11.5–15.5)
WBC: 5.4 10*3/uL (ref 4.0–10.5)
nRBC: 0 % (ref 0.0–0.2)

## 2019-06-08 LAB — BASIC METABOLIC PANEL
Anion gap: 10 (ref 5–15)
BUN: 16 mg/dL (ref 6–20)
CO2: 26 mmol/L (ref 22–32)
Calcium: 8.7 mg/dL — ABNORMAL LOW (ref 8.9–10.3)
Chloride: 104 mmol/L (ref 98–111)
Creatinine, Ser: 0.98 mg/dL (ref 0.61–1.24)
GFR calc Af Amer: >60 mL/min (ref >60)
GFR calc non Af Amer: >60 mL/min (ref >60)
Glucose, Bld: 115 mg/dL — ABNORMAL HIGH (ref 70–99)
Potassium: 3.2 mmol/L — ABNORMAL LOW (ref 3.5–5.1)
Sodium: 140 mmol/L (ref 135–145)

## 2019-06-08 LAB — ECHOCARDIOGRAM COMPLETE
Height: 70 in
Weight: 3060.8 oz

## 2019-06-08 LAB — HEPARIN LEVEL (UNFRACTIONATED)
Heparin Unfractionated: 0.83 IU/mL — ABNORMAL HIGH (ref 0.30–0.70)
Heparin Unfractionated: 0.69 IU/mL (ref 0.30–0.70)
Heparin Unfractionated: 0.57 IU/mL (ref 0.30–0.70)

## 2019-06-08 LAB — HIV ANTIBODY (ROUTINE TESTING W REFLEX): HIV Screen 4th Generation wRfx: NONREACTIVE

## 2019-06-08 MED ORDER — POTASSIUM CHLORIDE CRYS ER 20 MEQ PO TBCR
40.0000 meq | EXTENDED_RELEASE_TABLET | ORAL | Status: AC
  Administered 2019-06-08: 40 meq via ORAL
  Filled 2019-06-08 (×2): qty 2

## 2019-06-08 MED ORDER — DILTIAZEM LOAD VIA INFUSION
10.0000 mg | Freq: Once | INTRAVENOUS | Status: AC
  Administered 2019-06-08: 10 mg via INTRAVENOUS
  Filled 2019-06-08: qty 10

## 2019-06-08 MED ORDER — ORAL CARE MOUTH RINSE
15.0000 mL | Freq: Two times a day (BID) | OROMUCOSAL | Status: DC
  Administered 2019-06-09 (×2): 15 mL via OROMUCOSAL

## 2019-06-08 MED ORDER — DILTIAZEM HCL-DEXTROSE 125-5 MG/125ML-% IV SOLN (PREMIX)
5.0000 mg/h | INTRAVENOUS | Status: DC
  Administered 2019-06-08: 5 mg/h via INTRAVENOUS
  Filled 2019-06-08 (×2): qty 125

## 2019-06-08 MED ORDER — PERFLUTREN LIPID MICROSPHERE
1.0000 mL | INTRAVENOUS | Status: AC | PRN
  Administered 2019-06-08: 2 mL via INTRAVENOUS
  Filled 2019-06-08: qty 10

## 2019-06-08 MED ORDER — METOPROLOL TARTRATE 5 MG/5ML IV SOLN
5.0000 mg | INTRAVENOUS | Status: DC | PRN
  Administered 2019-06-08 – 2019-06-10 (×5): 5 mg via INTRAVENOUS
  Filled 2019-06-08 (×6): qty 5

## 2019-06-08 NOTE — Consult Note (Signed)
ANTICOAGULATION CONSULT NOTE  Pharmacy Consult for Heparin infusion Indication: pulmonary embolus  No Known Allergies  Patient Measurements: Height: 5\' 10"  (177.8 cm) Weight: 191 lb 4.8 oz (86.8 kg) IBW/kg (Calculated) : 73 Heparin Dosing Weight: 86.2 kg  Vital Signs: Temp: 98.8 F (37.1 C) (03/24 1554) Temp Source: Oral (03/24 1554) BP: 122/87 (03/24 1554) Pulse Rate: 102 (03/24 1554)  Labs: Recent Labs    06/07/19 1636 06/07/19 1830 06/08/19 0211 06/08/19 0906 06/08/19 1457  HGB 15.7  --  13.7  --   --   HCT 47.7  --  41.0  --   --   PLT 197  --  167  --   --   APTT 26  --   --   --   --   LABPROT 12.8  --   --   --   --   INR 1.0  --   --   --   --   HEPARINUNFRC  --   --  0.83* 0.69 0.57  CREATININE 1.12  --  0.98  --   --   TROPONINIHS 8 8  --   --   --     Estimated Creatinine Clearance: 85.9 mL/min (by C-G formula based on SCr of 0.98 mg/dL).   Medical History: Past Medical History:  Diagnosis Date  . A-fib ([place])     Medications:  No anticoagulation prior to admission. Hx of rivaroxaban in the past but not taking.   Assessment: Patient is a 59 y/o M with a medical history including atrial fibrillation and alcohol abuse who presented with shortness of breath and mid-sternal chest pain for several days. CTA positive for PE. Pharmacy consulted to initiate heparin infusion.  Baseline H&H within normal limits. Baseline coags within normal limits.  3/24 0906 HL 0.69  3/24 1457 HL 0.57   Goal of Therapy:  Heparin level 0.3-0.7 units/ml Monitor platelets by anticoagulation protocol: Yes   Plan:  Heparin level is therapeutic. Continue heparin drip at 1200 units/hr. Will order heparin level and CBC with AM labs.   Eleonore Chiquito, PharmD Clinical Pharmacist 06/08/2019,4:03 PM

## 2019-06-08 NOTE — Progress Notes (Signed)
*  PRELIMINARY RESULTS* Echocardiogram 2D Echocardiogram has been performed.  Calvin Esparza 06/08/2019, 10:03 AM

## 2019-06-08 NOTE — Consult Note (Signed)
ANTICOAGULATION CONSULT NOTE  Pharmacy Consult for Heparin infusion Indication: pulmonary embolus  No Known Allergies  Patient Measurements: Height: 5\' 10"  (177.8 cm) Weight: 191 lb 4.8 oz (86.8 kg) IBW/kg (Calculated) : 73 Heparin Dosing Weight: 86.2 kg  Vital Signs: Temp: 98.4 F (36.9 C) (03/24 0719) Temp Source: Oral (03/24 0719) BP: 122/94 (03/24 0719) Pulse Rate: 100 (03/24 0719)  Labs: Recent Labs    06/07/19 1636 06/07/19 1830 06/08/19 0211 06/08/19 0906  HGB 15.7  --  13.7  --   HCT 47.7  --  41.0  --   PLT 197  --  167  --   APTT 26  --   --   --   LABPROT 12.8  --   --   --   INR 1.0  --   --   --   HEPARINUNFRC  --   --  0.83* 0.69  CREATININE 1.12  --  0.98  --   TROPONINIHS 8 8  --   --     Estimated Creatinine Clearance: 85.9 mL/min (by C-G formula based on SCr of 0.98 mg/dL).   Medical History: Past Medical History:  Diagnosis Date  . A-fib ([place])     Medications:  No anticoagulation prior to admission. Hx of rivaroxaban in the past but not taking.   Assessment: Patient is a 59 y/o M with a medical history including atrial fibrillation and alcohol abuse who presented with shortness of breath and mid-sternal chest pain for several days. CTA positive for PE. Pharmacy consulted to initiate heparin infusion.  Baseline H&H within normal limits. Baseline coags within normal limits.   Goal of Therapy:  Heparin level 0.3-0.7 units/ml Monitor platelets by anticoagulation protocol: Yes   Plan:  3/24 0906 HL 0.69, therapeutic. Continue heparin drip at 1200 units/hr. Repeat HL at 1500. CBC daily.  Dorena Bodo, PharmD Clinical Pharmacist 06/08/2019,11:16 AM

## 2019-06-08 NOTE — Consult Note (Signed)
ANTICOAGULATION CONSULT NOTE - Initial Consult  Pharmacy Consult for Heparin infusion Indication: pulmonary embolus  No Known Allergies  Patient Measurements: Height: 5\' 10"  (177.8 cm) Weight: 191 lb 4.8 oz (86.8 kg) IBW/kg (Calculated) : 73 Heparin Dosing Weight: 86.2 kg  Vital Signs: Temp: 99.6 F (37.6 C) (03/23 2311) Temp Source: Oral (03/23 2311) BP: 124/86 (03/23 2311) Pulse Rate: 101 (03/24 0005)  Labs: Recent Labs    06/07/19 1636 06/07/19 1830 06/08/19 0211  HGB 15.7  --  13.7  HCT 47.7  --  41.0  PLT 197  --  167  APTT 26  --   --   LABPROT 12.8  --   --   INR 1.0  --   --   HEPARINUNFRC  --   --  0.83*  CREATININE 1.12  --   --   TROPONINIHS 8 8  --     Estimated Creatinine Clearance: 75.1 mL/min (by C-G formula based on SCr of 1.12 mg/dL).   Medical History: Past Medical History:  Diagnosis Date  . A-fib ([place])     Medications:  No anticoagulation prior to admission. Hx of rivaroxaban in the past but not taking.   Assessment: Patient is a 59 y/o M with a medical history including atrial fibrillation and alcohol abuse who presented with shortness of breath and mid-sternal chest pain for several days. CTA positive for PE. Pharmacy consulted to initiate heparin infusion.  Baseline H&H within normal limits. Baseline coags within normal limits.   Goal of Therapy:  Heparin level 0.3-0.7 units/ml Monitor platelets by anticoagulation protocol: Yes   Plan:  03/24 @ 0200 HL 0.83 supratherapeutic. Will decrease rate to 1200 units/hr and will recheck HL at 0900 and continue to monitor, CBC stable.  Tobie Lords, PharmD, BCPS Clinical Pharmacist 06/08/2019,3:11 AM

## 2019-06-08 NOTE — Progress Notes (Signed)
Pt arrived on the unit from the ED at 2255. Pt was A&Ox4. Pt had HR in the 130s-140s. Dr. Sidney Ace made aware and he put orders in for one time dose of IV cardizem and to start Cardizem drip. This RN gave one time dose of Cardizem, IV ativan, and PO tylenol for slight fever and started IV fluids and pts HR came down to low 100s to high 90s and stayed that way for the rest of the shift. Other than being slightly diaphoretic pt was asymptomatic. At the time Cardizem drip was not needed. Dayshift nurse made aware of these events. Pt asked not to get out of bed without assistance and pt verbalized understanding. Fall risk bracelet applied. Pt oriented to room and bedside equipment. Pt had no c/o pain. Bed in lowest position, bed alarm is on, and the call bell is within reach.

## 2019-06-08 NOTE — Progress Notes (Addendum)
PROGRESS NOTE    CRISTIN THAKER  [id number] DOB: [birth year] DOA: 06/07/2019  PCP: [name], MD    LOS - 1   Brief Narrative:  Codi Krahenbuhl  is a 59 y.o. Caucasian male with a history of COVID-19 in December 2020, presented to the ED on 3/23 with worsening dyspnea over the last couple of days with associated palpitations.  In the ED, hypertensive 155/105, tachycardic HR 128, tachypneic RR 23, no hypoxia.  Labs showed K 3.4, normal troponin x2, normal CMP and CBC.  CXR negative.  CTA chest showed mild amount of pulmonary embolism within a lower lobe branch of the right pulmonary artery.  EKG showed A. fib with RVR, rate 151.  Patient was started on heparin drip, IV Cardizem for heart rate control.  Admitted to hospitalist service.   Subjective 3/24: Patient seen laying in bed this morning.  No acute events reported overnight.  He denies any shortness of breath at rest.  Reports some sinus congestion that he says may be seasonal allergies.  No fevers or chills or chest pain.  End of encounter, RN assisted patient to restroom and he did not experience dyspnea on exertion.  Assessment & Plan:   Principal Problem:   Acute pulmonary embolism (HCC) Active Problems:   Atrial fibrillation with RVR (HCC)   Chronic systolic CHF (congestive heart failure) ([place])   Acute pulmonary embolism -seems unprovoked, however patient did have COVID-19 in December.  Lower extremity duplex ultrasounds negative for DVTs bilaterally.  Echocardiogram showed EF of 35 to 40%, LV global hypokinesis, grade 1 diastolic dysfunction, no right heart strain.  No prior echo to compare EF. --Continue heparin drip --Likely transition to Eliquis tomorrow --Telemetry monitoring  Chronic mixed systolic/diastolic CHF -euvolemic, compensated.  Echo on 3/24 showed EF 35 to 40% with LV global hypokinesis and grade 1 diastolic dysfunction. --Cardiology consult vs outpatient follow-up  A. fib with RVR - new  onset, likely paroxysmal and provoked by acute PE --Continue oral metoprolol --IV Lopressor as needed for heart rate sustained over 110 at rest --Likely will DC on Eliquis for A. fib and PE --Telemetry monitoring  Alcohol abuse --CIWA protocol --Banana bag daily   DVT prophylaxis: On heparin drip   Code Status: Full Code  Family Communication: Significant other ?Wife at bedside during encounter, updated  Disposition Plan: Expect home in 24 to 48 hours pending heart rate controlled and transition to oral anticoagulant, cardiology eval. Coming From home Exp DC Date 3/25 Barriers as above Medically Stable for Discharge?  No  Consultants:   None  Procedures:   None  Antimicrobials:   None   Objective: Vitals:   06/08/19 0600 06/08/19 0719 06/08/19 1143 06/08/19 1157  BP:  (!) 122/94 (!) 136/93 (!) 125/105  Pulse:  100  90  Resp: 10 16  18   Temp:  98.4 F (36.9 C)  97.7 F (36.5 C)  TempSrc:  Oral  Oral  SpO2:  93%  94%  Weight:      Height:        Intake/Output Summary (Last 24 hours) at 06/08/2019 1542 Last data filed at 06/08/2019 1006 Gross per 24 hour  Intake 564.82 ml  Output 200 ml  Net 364.82 ml   Filed Weights   06/07/19 1631 06/07/19 2311 06/08/19 0150  Weight: 86.2 kg 87.5 kg 86.8 kg    Examination:  General exam: awake, alert, no acute distress HEENT: moist mucus membranes, hearing grossly normal  Respiratory system: Decreased breath  sounds but clear bilaterally, no wheezes, rales or rhonchi, normal respiratory effort. Cardiovascular system: normal S1/S2, RRR, no JVD, murmurs, rubs, gallops, no pedal edema.   Central nervous system: A&O x4. no gross focal neurologic deficits, normal speech Extremities: moves all, no edema, normal tone Skin: dry, intact, normal temperature, normal color Psychiatry: normal mood, congruent affect, judgement and insight appear normal    Data Reviewed: I have personally reviewed following labs and imaging  studies  CBC: Recent Labs  Lab 06/07/19 1636 06/08/19 0211  WBC 6.3 5.4  NEUTROABS  --  3.0  HGB 15.7 13.7  HCT 47.7 41.0  MCV 95.6 94.9  PLT 197 [id number]   Basic Metabolic Panel: Recent Labs  Lab 06/07/19 1636 06/07/19 1647 06/08/19 0211  NA 140  --  140  K 3.6  --  3.2*  CL 103  --  104  CO2 24  --  26  GLUCOSE 125*  --  115*  BUN 15  --  16  CREATININE 1.12  --  0.98  CALCIUM 9.9  --  8.7*  MG  --  2.0  --    GFR: Estimated Creatinine Clearance: 85.9 mL/min (by C-G formula based on SCr of 0.98 mg/dL). Liver Function Tests: Recent Labs  Lab 06/07/19 1636  AST 31  ALT 24  ALKPHOS 59  BILITOT 1.5*  PROT 8.2*  ALBUMIN 4.6   No results for input(s): LIPASE, AMYLASE in the last 168 hours. No results for input(s): AMMONIA in the last 168 hours. Coagulation Profile: Recent Labs  Lab 06/07/19 1636  INR 1.0   Cardiac Enzymes: No results for input(s): CKTOTAL, CKMB, CKMBINDEX, TROPONINI in the last 168 hours. BNP (last 3 results) No results for input(s): PROBNP in the last 8760 hours. HbA1C: No results for input(s): HGBA1C in the last 72 hours. CBG: No results for input(s): GLUCAP in the last 168 hours. Lipid Profile: No results for input(s): CHOL, HDL, LDLCALC, TRIG, CHOLHDL, LDLDIRECT in the last 72 hours. Thyroid Function Tests: Recent Labs    06/07/19 1636 06/07/19 1636 06/07/19 1830  TSH 3.510   < > 1.823  FREET4 0.96  --   --    < > = values in this interval not displayed.   Anemia Panel: No results for input(s): VITAMINB12, FOLATE, FERRITIN, TIBC, IRON, RETICCTPCT in the last 72 hours. Sepsis Labs: No results for input(s): PROCALCITON, LATICACIDVEN in the last 168 hours.  No results found for this or any previous visit (from the past 240 hour(s)).       Radiology Studies: DG Chest 2 View  Result Date: 06/07/2019 CLINICAL DATA:  Shortness of breath. EXAM: CHEST - 2 VIEW COMPARISON:  March 14, 2019 FINDINGS: The heart size and  mediastinal contours are within normal limits. Both lungs are clear. A chronic seventh right rib fracture is seen. Degenerative changes seen throughout the thoracic spine. IMPRESSION: No active cardiopulmonary disease. Electronically Signed   By: Virgina Norfolk M.D.   On: 06/07/2019 17:26   CT Angio Chest PE W and/or Wo Contrast  Result Date: 06/07/2019 CLINICAL DATA:  Shortness of breath. EXAM: CT ANGIOGRAPHY CHEST WITH CONTRAST TECHNIQUE: Multidetector CT imaging of the chest was performed using the standard protocol during bolus administration of intravenous contrast. Multiplanar CT image reconstructions and MIPs were obtained to evaluate the vascular anatomy. CONTRAST:  69mL OMNIPAQUE IOHEXOL 350 MG/ML SOLN COMPARISON:  None. FINDINGS: Cardiovascular: Satisfactory opacification of the pulmonary arteries to the segmental level. A mild amount of intraluminal low  attenuation is seen within a lower lobe branch of the right pulmonary artery (axial CT images 52 through 54, CT series number 4/coronal reformatted images 54 through 56, CT series number 7). Normal heart size. No pericardial effusion. Mediastinum/Nodes: No enlarged mediastinal, hilar, or axillary lymph nodes. Thyroid gland, trachea, and esophagus demonstrate no significant findings. Lungs/Pleura: Lungs are clear. No pleural effusion or pneumothorax. Upper Abdomen: No acute abnormality. Musculoskeletal: No chest wall abnormality. No acute or significant osseous findings. Review of the MIP images confirms the above findings. IMPRESSION: A mild amount of pulmonary embolism within a lower lobe branch of the right pulmonary artery. Electronically Signed   By: Virgina Norfolk M.D.   On: 06/07/2019 18:42   US Venous Img Lower Bilateral (DVT)  Result Date: 06/07/2019 CLINICAL DATA:  Initial evaluation for pulmonary embolus. EXAM: BILATERAL LOWER EXTREMITY VENOUS DOPPLER ULTRASOUND TECHNIQUE: Gray-scale sonography with graded compression, as well as  color Doppler and duplex ultrasound were performed to evaluate the lower extremity deep venous systems from the level of the common femoral vein and including the common femoral, femoral, profunda femoral, popliteal and calf veins including the posterior tibial, peroneal and gastrocnemius veins when visible. The superficial great saphenous vein was also interrogated. Spectral Doppler was utilized to evaluate flow at rest and with distal augmentation maneuvers in the common femoral, femoral and popliteal veins. COMPARISON:  Prior CTA of the chest from earlier the same day. FINDINGS: RIGHT LOWER EXTREMITY Common Femoral Vein: No evidence of thrombus. Normal compressibility, respiratory phasicity and response to augmentation. Saphenofemoral Junction: No evidence of thrombus. Normal compressibility and flow on color Doppler imaging. Profunda Femoral Vein: No evidence of thrombus. Normal compressibility and flow on color Doppler imaging. Femoral Vein: No evidence of thrombus. Normal compressibility, respiratory phasicity and response to augmentation. Popliteal Vein: No evidence of thrombus. Normal compressibility, respiratory phasicity and response to augmentation. Calf Veins: No evidence of thrombus. Normal compressibility and flow on color Doppler imaging. Superficial Great Saphenous Vein: No evidence of thrombus. Normal compressibility. Venous Reflux:  None. Other Findings:  None. LEFT LOWER EXTREMITY Common Femoral Vein: No evidence of thrombus. Normal compressibility, respiratory phasicity and response to augmentation. Saphenofemoral Junction: No evidence of thrombus. Normal compressibility and flow on color Doppler imaging. Profunda Femoral Vein: No evidence of thrombus. Normal compressibility and flow on color Doppler imaging. Femoral Vein: No evidence of thrombus. Normal compressibility, respiratory phasicity and response to augmentation. Popliteal Vein: No evidence of thrombus. Normal compressibility,  respiratory phasicity and response to augmentation. Calf Veins: No evidence of thrombus. Normal compressibility and flow on color Doppler imaging. Superficial Great Saphenous Vein: No evidence of thrombus. Normal compressibility. Venous Reflux:  None. Other Findings: Multiple thrombosed superficial varicose veins seen within the medial left lower leg just below the knee. IMPRESSION: 1. No evidence of deep venous thrombosis in either lower extremity. 2. Multiple thrombosed superficial varicose veins at the medial aspect of the left lower leg. Electronically Signed   By: Jeannine Boga M.D.   On: 06/07/2019 22:58   ECHOCARDIOGRAM COMPLETE  Result Date: 06/08/2019    ECHOCARDIOGRAM REPORT   Patient Name:   QUINTEL DELFIERRO Date of Exam: 06/08/2019 Medical Rec #:  [id number]        Height:       70.0 in Accession #:    [id number]       Weight:       191.3 lb Date of Birth:  03-03-1961       BSA:  2.048 m Patient Age:    30 years         BP:           122/94 mmHg Patient Gender: M                HR:           101 bpm. Exam Location:  [facility] Procedure: 2D Echo, Color Doppler, Cardiac Doppler and Intracardiac            Opacification Agent Indications:     I26.99 Pulmonary Embolus  History:         Patient has no prior history of Echocardiogram examinations.                  Arrythmias:Atrial Fibrillation. Pt tested positive for covid-19                  on 03/14/2019.  Sonographer:     Charmayne Sheer RDCS (AE) Referring Phys:  [id number] Arvella Merles MANSY Diagnosing Phys: Kate Sable MD  Sonographer Comments: Suboptimal subcostal window. IMPRESSIONS  1. Left ventricular ejection fraction, by estimation, is 35 to 40%. The left ventricle has moderately decreased function. The left ventricle demonstrates global hypokinesis. There is mild left ventricular hypertrophy. Left ventricular diastolic parameters are consistent with Grade I diastolic dysfunction (impaired relaxation).  2. Right ventricular systolic function  is mildly reduced. The right ventricular size is moderately enlarged.  3. Left atrial size was mildly dilated.  4. Right atrial size was mild to moderately dilated.  5. The mitral valve is grossly normal. No evidence of mitral valve regurgitation.  6. The aortic valve is tricuspid. Aortic valve regurgitation is trivial.  7. The inferior vena cava is normal in size with greater than 50% respiratory variability, suggesting right atrial pressure of 3 mmHg. FINDINGS  Left Ventricle: Left ventricular ejection fraction, by estimation, is 35 to 40%. The left ventricle has moderately decreased function. The left ventricle demonstrates global hypokinesis. Definity contrast agent was given IV to delineate the left ventricular endocardial borders. The left ventricular internal cavity size was normal in size. There is mild left ventricular hypertrophy. Left ventricular diastolic parameters are consistent with Grade I diastolic dysfunction (impaired relaxation). Right Ventricle: The right ventricular size is moderately enlarged. Right vetricular wall thickness was not assessed. Right ventricular systolic function is mildly reduced. Left Atrium: Left atrial size was mildly dilated. Right Atrium: Right atrial size was mild to moderately dilated. Pericardium: There is no evidence of pericardial effusion. Mitral Valve: The mitral valve is grossly normal. No evidence of mitral valve regurgitation. MV peak gradient, 2.7 mmHg. The mean mitral valve gradient is 2.0 mmHg. Tricuspid Valve: The tricuspid valve is not well visualized. Tricuspid valve regurgitation is not demonstrated. Aortic Valve: The aortic valve is tricuspid. Aortic valve regurgitation is trivial. Aortic regurgitation PHT measures 498 msec. Aortic valve mean gradient measures 3.0 mmHg. Aortic valve peak gradient measures 5.0 mmHg. Aortic valve area, by VTI measures  3.31 cm. Pulmonic Valve: The pulmonic valve was not well visualized. Pulmonic valve regurgitation is not  visualized. Aorta: The aortic root is normal in size and structure. Venous: The inferior vena cava is normal in size with greater than 50% respiratory variability, suggesting right atrial pressure of 3 mmHg. IAS/Shunts: No atrial level shunt detected by color flow Doppler.  LEFT VENTRICLE PLAX 2D LVIDd:         5.15 cm     Diastology LVIDs:  4.22 cm     LV e' lateral:   10.20 cm/s LV PW:         1.35 cm     LV E/e' lateral: 5.3 LV IVS:        1.13 cm     LV e' medial:    5.00 cm/s LVOT diam:     2.50 cm     LV E/e' medial:  10.8 LV SV:         58 LV SV Index:   28 LVOT Area:     4.91 cm  LV Volumes (MOD) LV vol d, MOD A2C: 84.8 ml LV vol d, MOD A4C: 86.1 ml LV vol s, MOD A2C: 41.2 ml LV vol s, MOD A4C: 54.7 ml LV SV MOD A2C:     43.6 ml LV SV MOD A4C:     86.1 ml LV SV MOD BP:      40.6 ml RIGHT VENTRICLE RV Basal diam:  5.29 cm LEFT ATRIUM           Index       RIGHT ATRIUM           Index LA diam:      4.30 cm 2.10 cm/m  RA Area:     24.30 cm LA Vol (A4C): 74.6 ml 36.42 ml/m RA Volume:   84.50 ml  41.25 ml/m  AORTIC VALVE                   PULMONIC VALVE AV Area (Vmax):    3.43 cm    PV Vmax:       0.67 m/s AV Area (Vmean):   3.55 cm    PV Vmean:      46.600 cm/s AV Area (VTI):     3.31 cm    PV VTI:        0.084 m AV Vmax:           112.00 cm/s PV Peak grad:  1.8 mmHg AV Vmean:          74.100 cm/s PV Mean grad:  1.0 mmHg AV VTI:            0.175 m AV Peak Grad:      5.0 mmHg AV Mean Grad:      3.0 mmHg LVOT Vmax:         78.30 cm/s LVOT Vmean:        53.600 cm/s LVOT VTI:          0.118 m LVOT/AV VTI ratio: 0.67 AI PHT:            498 msec  AORTA Ao Root diam: 3.60 cm MITRAL VALVE MV Area (PHT): 4.31 cm    SHUNTS MV Peak grad:  2.7 mmHg    Systemic VTI:  0.12 m MV Mean grad:  2.0 mmHg    Systemic Diam: 2.50 cm MV Vmax:       0.82 m/s MV Vmean:      57.0 cm/s MV Decel Time: 176 msec MV E velocity: 54.10 cm/s MV A velocity: 38.30 cm/s MV E/A ratio:  1.41 Kate Sable MD Electronically signed  by Kate Sable MD Signature Date/Time: 06/08/2019/3:09:42 PM    Final         Scheduled Meds: . metoprolol tartrate  25 mg Oral BID  . potassium chloride  40 mEq Oral Q4H   Continuous Infusions: . sodium chloride 75 mL/hr at 06/08/19 0422  . heparin 1,200 Units/hr (06/08/19 0422)  LOS: 1 day    Time spent: 40 minutes    Ezekiel Slocumb, DO [facility]   If 7PM-7AM, please contact night-coverage [email or web address] 06/08/2019, 3:42 PM

## 2019-06-09 ENCOUNTER — Encounter: Payer: Self-pay | Admitting: Family Medicine

## 2019-06-09 DIAGNOSIS — I2690 Septic pulmonary embolism without acute cor pulmonale: Secondary | ICD-10-CM

## 2019-06-09 DIAGNOSIS — I4891 Unspecified atrial fibrillation: Secondary | ICD-10-CM

## 2019-06-09 DIAGNOSIS — J810 Acute pulmonary edema: Secondary | ICD-10-CM

## 2019-06-09 DIAGNOSIS — I5042 Chronic combined systolic (congestive) and diastolic (congestive) heart failure: Secondary | ICD-10-CM

## 2019-06-09 DIAGNOSIS — F1010 Alcohol abuse, uncomplicated: Secondary | ICD-10-CM

## 2019-06-09 DIAGNOSIS — I2699 Other pulmonary embolism without acute cor pulmonale: Secondary | ICD-10-CM | POA: Diagnosis not present

## 2019-06-09 DIAGNOSIS — I428 Other cardiomyopathies: Secondary | ICD-10-CM

## 2019-06-09 DIAGNOSIS — I420 Dilated cardiomyopathy: Secondary | ICD-10-CM

## 2019-06-09 LAB — CBC WITH DIFFERENTIAL/PLATELET
Abs Immature Granulocytes: 0.01 10*3/uL (ref 0.00–0.07)
Basophils Absolute: 0 10*3/uL (ref 0.0–0.1)
Basophils Relative: 1 %
Eosinophils Absolute: 0.1 10*3/uL (ref 0.0–0.5)
Eosinophils Relative: 2 %
HCT: 35.3 % — ABNORMAL LOW (ref 39.0–52.0)
Hemoglobin: 11.2 g/dL — ABNORMAL LOW (ref 13.0–17.0)
Immature Granulocytes: 0 %
Lymphocytes Relative: 34 %
Lymphs Abs: 1.4 10*3/uL (ref 0.7–4.0)
MCH: 31.3 pg (ref 26.0–34.0)
MCHC: 31.7 g/dL (ref 30.0–36.0)
MCV: 98.6 fL (ref 80.0–100.0)
Monocytes Absolute: 0.3 10*3/uL (ref 0.1–1.0)
Monocytes Relative: 6 %
Neutro Abs: 2.4 10*3/uL (ref 1.7–7.7)
Neutrophils Relative %: 57 %
Platelets: 126 10*3/uL — ABNORMAL LOW (ref 150–400)
RBC: 3.58 MIL/uL — ABNORMAL LOW (ref 4.22–5.81)
RDW: 13.6 % (ref 11.5–15.5)
WBC: 4.2 10*3/uL (ref 4.0–10.5)
nRBC: 0 % (ref 0.0–0.2)

## 2019-06-09 LAB — HEPARIN LEVEL (UNFRACTIONATED): Heparin Unfractionated: 1.41 IU/mL — ABNORMAL HIGH (ref 0.30–0.70)

## 2019-06-09 LAB — BASIC METABOLIC PANEL
Anion gap: 4 — ABNORMAL LOW (ref 5–15)
BUN: 9 mg/dL (ref 6–20)
CO2: 21 mmol/L — ABNORMAL LOW (ref 22–32)
Calcium: 7.1 mg/dL — ABNORMAL LOW (ref 8.9–10.3)
Chloride: 116 mmol/L — ABNORMAL HIGH (ref 98–111)
Creatinine, Ser: 0.62 mg/dL (ref 0.61–1.24)
GFR calc Af Amer: >60 mL/min (ref >60)
GFR calc non Af Amer: >60 mL/min (ref >60)
Glucose, Bld: 94 mg/dL (ref 70–99)
Potassium: 3.1 mmol/L — ABNORMAL LOW (ref 3.5–5.1)
Sodium: 141 mmol/L (ref 135–145)

## 2019-06-09 LAB — MAGNESIUM: Magnesium: 1.7 mg/dL (ref 1.7–2.4)

## 2019-06-09 MED ORDER — MAGNESIUM SULFATE 2 GM/50ML IV SOLN
2.0000 g | Freq: Once | INTRAVENOUS | Status: AC
  Administered 2019-06-09: 2 g via INTRAVENOUS
  Filled 2019-06-09: qty 50

## 2019-06-09 MED ORDER — METOPROLOL TARTRATE 50 MG PO TABS
50.0000 mg | ORAL_TABLET | Freq: Two times a day (BID) | ORAL | Status: DC
  Administered 2019-06-09: 50 mg via ORAL
  Filled 2019-06-09: qty 1

## 2019-06-09 MED ORDER — HEPARIN (PORCINE) 25000 UT/250ML-% IV SOLN
800.0000 [IU]/h | INTRAVENOUS | Status: AC
  Administered 2019-06-09: 800 [IU]/h via INTRAVENOUS

## 2019-06-09 MED ORDER — APIXABAN 5 MG PO TABS
10.0000 mg | ORAL_TABLET | Freq: Two times a day (BID) | ORAL | Status: DC
  Administered 2019-06-09 – 2019-06-11 (×5): 10 mg via ORAL
  Filled 2019-06-09 (×5): qty 2

## 2019-06-09 MED ORDER — METOPROLOL TARTRATE 50 MG PO TABS: 75.0000 mg | ORAL_TABLET | Freq: Two times a day (BID) | ORAL | Status: DC

## 2019-06-09 MED ORDER — LOSARTAN POTASSIUM 25 MG PO TABS
25.0000 mg | ORAL_TABLET | Freq: Every day | ORAL | Status: DC
  Administered 2019-06-09 – 2019-06-11 (×3): 25 mg via ORAL
  Filled 2019-06-09 (×3): qty 1

## 2019-06-09 MED ORDER — METOPROLOL TARTRATE 50 MG PO TABS
75.0000 mg | ORAL_TABLET | Freq: Two times a day (BID) | ORAL | Status: DC
  Administered 2019-06-09 – 2019-06-10 (×2): 75 mg via ORAL
  Filled 2019-06-09 (×2): qty 1

## 2019-06-09 MED ORDER — POTASSIUM CHLORIDE CRYS ER 20 MEQ PO TBCR
40.0000 meq | EXTENDED_RELEASE_TABLET | ORAL | Status: AC
  Administered 2019-06-09 (×2): 40 meq via ORAL
  Filled 2019-06-09 (×2): qty 2

## 2019-06-09 MED ORDER — APIXABAN 5 MG PO TABS: 5.0000 mg | ORAL_TABLET | Freq: Two times a day (BID) | ORAL | Status: DC

## 2019-06-09 MED ORDER — REGADENOSON 0.4 MG/5ML IV SOLN
0.4000 mg | Freq: Once | INTRAVENOUS | Status: AC
  Administered 2019-06-10: 0.4 mg via INTRAVENOUS
  Filled 2019-06-09: qty 5

## 2019-06-09 NOTE — Progress Notes (Signed)
PROGRESS NOTE    Calvin Esparza  [id number] DOB: 04-02-1960 DOA: 06/07/2019  PCP: [name], MD    LOS - 2   Brief Narrative:  [name] a58 y.o.Caucasian malewith a history of COVID-19 in December 2020, presented to the ED on 3/23 with worsening dyspnea over the last couple of days with associated palpitations. In the ED, hypertensive 155/105, tachycardic HR 128, tachypneic RR 23, no hypoxia.  Labs showed K 3.4, normal troponin x2, normal CMP and CBC.  CXR negative.  CTA chest showed mild amount of pulmonary embolism within a lower lobe branch of the right pulmonary artery.  EKG showed A. fib with RVR, rate 151.  Patient was started on heparin drip, IV Cardizem for heart rate control.  Admitted to hospitalist service.  Subjective 3/25: Patient seen at bedside this morning.  No acute events reported overnight.  He reports feeling fine today.  Denies any chest pain, palpitations, shortness of breath or other acute complaints.     Assessment & Plan:   Principal Problem:   Acute pulmonary embolism (HCC) Active Problems:   Atrial fibrillation with RVR (HCC)   Chronic combined systolic and diastolic CHF (congestive heart failure) ([place])  Acute pulmonary embolism -seems unprovoked, however patient did have COVID-19 in December.  Lower extremity duplex ultrasounds negative for DVTs bilaterally.  Echocardiogram showed EF of 35 to 40%, LV global hypokinesis, grade 1 diastolic dysfunction, no right heart strain.  No prior echo to compare EF. --d/c heparin drip --transition to Eliquis today --telemetry monitoring  A. fib with RVR - new onset, likely paroxysmal and provoked by acute PE.  Required going back on Cardizem drip on 3/24, HR uncontrolled on initial dose of metoprolol --continue Cardizem drip --Increase oral metoprolol to 50 mg BID --IV Lopressor as needed for heart rate sustained over 110 at rest --Likely will DC on Eliquis for A. fib and PE --Telemetry  monitoring --cardiology consulted, planning for Lexiscan tomorrow  Chronic mixed systolic/diastolic CHF - euvolemic, compensated.  Echo on 3/24 showed EF 35 to 40% with LV global hypokinesis and grade 1 diastolic dysfunction. --continue beta blocker as above   Alcohol abuse --CIWA protocol --Banana bag daily   DVT prophylaxis: on Eliquis   Code Status: Full Code  Family Communication: None at bedside during encounter, patient stated he will update.  Disposition Plan: Discharge home pending cardiology consult in stable heart rate off of Cardizem drip. Coming From home Exp DC Date 3/26 Barriers as above Medically Stable for Discharge?  No  Consultants:   Cardiology  Procedures:   None  Antimicrobials:   None   Objective: Vitals:   06/09/19 0459 06/09/19 0500 06/09/19 0510 06/09/19 0817  BP:   125/88 131/89  Pulse:  88 88 83  Resp:   18 18  Temp: 98 F (36.7 C)   98.4 F (36.9 C)  TempSrc: Oral   Oral  SpO2:   97% 96%  Weight:  87.6 kg    Height:        Intake/Output Summary (Last 24 hours) at 06/09/2019 1338 Last data filed at 06/09/2019 0950 Gross per 24 hour  Intake 220 ml  Output 400 ml  Net -180 ml   Filed Weights   06/07/19 2311 06/08/19 0150 06/09/19 0500  Weight: 87.5 kg 86.8 kg 87.6 kg    Examination:  General exam: Sleeping, awoke easily to voice, no acute distress Respiratory system: Decreased breath sounds, no wheezes, rales or rhonchi, normal respiratory effort. Cardiovascular system: normal  S1/S2, RRR, no JVD, murmurs, rubs, gallops, no pedal edema.   Central nervous system: A&O x4. no gross focal neurologic deficits, normal speech Extremities: moves all, no edema, normal tone Skin: dry, intact, normal temperature Psychiatry: normal mood, congruent affect, judgement and insight appear normal    Data Reviewed: I have personally reviewed following labs and imaging studies  CBC: Recent Labs  Lab 06/07/19 1636 06/08/19 0211  06/09/19 0547  WBC 6.3 5.4 4.2  NEUTROABS  --  3.0 2.4  HGB 15.7 13.7 11.2*  HCT 47.7 41.0 35.3*  MCV 95.6 94.9 98.6  PLT 197 167 [id number]*   Basic Metabolic Panel: Recent Labs  Lab 06/07/19 1636 06/07/19 1647 06/08/19 0211 06/09/19 0547  NA 140  --  140 141  K 3.6  --  3.2* 3.1*  CL 103  --  104 116*  CO2 24  --  26 21*  GLUCOSE 125*  --  115* 94  BUN 15  --  16 9  CREATININE 1.12  --  0.98 0.62  CALCIUM 9.9  --  8.7* 7.1*  MG  --  2.0  --  1.7   GFR: Estimated Creatinine Clearance: 113.5 mL/min (by C-G formula based on SCr of 0.62 mg/dL). Liver Function Tests: Recent Labs  Lab 06/07/19 1636  AST 31  ALT 24  ALKPHOS 59  BILITOT 1.5*  PROT 8.2*  ALBUMIN 4.6   No results for input(s): LIPASE, AMYLASE in the last 168 hours. No results for input(s): AMMONIA in the last 168 hours. Coagulation Profile: Recent Labs  Lab 06/07/19 1636  INR 1.0   Cardiac Enzymes: No results for input(s): CKTOTAL, CKMB, CKMBINDEX, TROPONINI in the last 168 hours. BNP (last 3 results) No results for input(s): PROBNP in the last 8760 hours. HbA1C: No results for input(s): HGBA1C in the last 72 hours. CBG: No results for input(s): GLUCAP in the last 168 hours. Lipid Profile: No results for input(s): CHOL, HDL, LDLCALC, TRIG, CHOLHDL, LDLDIRECT in the last 72 hours. Thyroid Function Tests: Recent Labs    06/07/19 1636 06/07/19 1636 06/07/19 1830  TSH 3.510   < > 1.823  FREET4 0.96  --   --    < > = values in this interval not displayed.   Anemia Panel: No results for input(s): VITAMINB12, FOLATE, FERRITIN, TIBC, IRON, RETICCTPCT in the last 72 hours. Sepsis Labs: No results for input(s): PROCALCITON, LATICACIDVEN in the last 168 hours.  No results found for this or any previous visit (from the past 240 hour(s)).       Radiology Studies: DG Chest 2 View  Result Date: 06/07/2019 CLINICAL DATA:  Shortness of breath. EXAM: CHEST - 2 VIEW COMPARISON:  March 14, 2019  FINDINGS: The heart size and mediastinal contours are within normal limits. Both lungs are clear. A chronic seventh right rib fracture is seen. Degenerative changes seen throughout the thoracic spine. IMPRESSION: No active cardiopulmonary disease. Electronically Signed   By: Virgina Norfolk M.D.   On: 06/07/2019 17:26   CT Angio Chest PE W and/or Wo Contrast  Result Date: 06/07/2019 CLINICAL DATA:  Shortness of breath. EXAM: CT ANGIOGRAPHY CHEST WITH CONTRAST TECHNIQUE: Multidetector CT imaging of the chest was performed using the standard protocol during bolus administration of intravenous contrast. Multiplanar CT image reconstructions and MIPs were obtained to evaluate the vascular anatomy. CONTRAST:  44mL OMNIPAQUE IOHEXOL 350 MG/ML SOLN COMPARISON:  None. FINDINGS: Cardiovascular: Satisfactory opacification of the pulmonary arteries to the segmental level. A mild amount of  intraluminal low attenuation is seen within a lower lobe branch of the right pulmonary artery (axial CT images 52 through 54, CT series number 4/coronal reformatted images 54 through 56, CT series number 7). Normal heart size. No pericardial effusion. Mediastinum/Nodes: No enlarged mediastinal, hilar, or axillary lymph nodes. Thyroid gland, trachea, and esophagus demonstrate no significant findings. Lungs/Pleura: Lungs are clear. No pleural effusion or pneumothorax. Upper Abdomen: No acute abnormality. Musculoskeletal: No chest wall abnormality. No acute or significant osseous findings. Review of the MIP images confirms the above findings. IMPRESSION: A mild amount of pulmonary embolism within a lower lobe branch of the right pulmonary artery. Electronically Signed   By: Virgina Norfolk M.D.   On: 06/07/2019 18:42   US Venous Img Lower Bilateral (DVT)  Result Date: 06/07/2019 CLINICAL DATA:  Initial evaluation for pulmonary embolus. EXAM: BILATERAL LOWER EXTREMITY VENOUS DOPPLER ULTRASOUND TECHNIQUE: Gray-scale sonography with  graded compression, as well as color Doppler and duplex ultrasound were performed to evaluate the lower extremity deep venous systems from the level of the common femoral vein and including the common femoral, femoral, profunda femoral, popliteal and calf veins including the posterior tibial, peroneal and gastrocnemius veins when visible. The superficial great saphenous vein was also interrogated. Spectral Doppler was utilized to evaluate flow at rest and with distal augmentation maneuvers in the common femoral, femoral and popliteal veins. COMPARISON:  Prior CTA of the chest from earlier the same day. FINDINGS: RIGHT LOWER EXTREMITY Common Femoral Vein: No evidence of thrombus. Normal compressibility, respiratory phasicity and response to augmentation. Saphenofemoral Junction: No evidence of thrombus. Normal compressibility and flow on color Doppler imaging. Profunda Femoral Vein: No evidence of thrombus. Normal compressibility and flow on color Doppler imaging. Femoral Vein: No evidence of thrombus. Normal compressibility, respiratory phasicity and response to augmentation. Popliteal Vein: No evidence of thrombus. Normal compressibility, respiratory phasicity and response to augmentation. Calf Veins: No evidence of thrombus. Normal compressibility and flow on color Doppler imaging. Superficial Great Saphenous Vein: No evidence of thrombus. Normal compressibility. Venous Reflux:  None. Other Findings:  None. LEFT LOWER EXTREMITY Common Femoral Vein: No evidence of thrombus. Normal compressibility, respiratory phasicity and response to augmentation. Saphenofemoral Junction: No evidence of thrombus. Normal compressibility and flow on color Doppler imaging. Profunda Femoral Vein: No evidence of thrombus. Normal compressibility and flow on color Doppler imaging. Femoral Vein: No evidence of thrombus. Normal compressibility, respiratory phasicity and response to augmentation. Popliteal Vein: No evidence of thrombus.  Normal compressibility, respiratory phasicity and response to augmentation. Calf Veins: No evidence of thrombus. Normal compressibility and flow on color Doppler imaging. Superficial Great Saphenous Vein: No evidence of thrombus. Normal compressibility. Venous Reflux:  None. Other Findings: Multiple thrombosed superficial varicose veins seen within the medial left lower leg just below the knee. IMPRESSION: 1. No evidence of deep venous thrombosis in either lower extremity. 2. Multiple thrombosed superficial varicose veins at the medial aspect of the left lower leg. Electronically Signed   By: Jeannine Boga M.D.   On: 06/07/2019 22:58   ECHOCARDIOGRAM COMPLETE  Result Date: 06/08/2019    ECHOCARDIOGRAM REPORT   Patient Name:   RYSZARD WARTA Date of Exam: 06/08/2019 Medical Rec #:  [id number]        Height:       70.0 in Accession #:    [id number]       Weight:       191.3 lb Date of Birth:  [birth year]       BSA:  2.048 m Patient Age:    74 years         BP:           122/94 mmHg Patient Gender: M                HR:           101 bpm. Exam Location:  [facility] Procedure: 2D Echo, Color Doppler, Cardiac Doppler and Intracardiac            Opacification Agent Indications:     I26.99 Pulmonary Embolus  History:         Patient has no prior history of Echocardiogram examinations.                  Arrythmias:Atrial Fibrillation. Pt tested positive for covid-19                  on 03/14/2019.  Sonographer:     Charmayne Sheer RDCS (AE) Referring Phys:  [id number] Arvella Merles MANSY Diagnosing Phys: Kate Sable MD  Sonographer Comments: Suboptimal subcostal window. IMPRESSIONS  1. Left ventricular ejection fraction, by estimation, is 35 to 40%. The left ventricle has moderately decreased function. The left ventricle demonstrates global hypokinesis. There is mild left ventricular hypertrophy. Left ventricular diastolic parameters are consistent with Grade I diastolic dysfunction (impaired relaxation).  2. Right  ventricular systolic function is mildly reduced. The right ventricular size is moderately enlarged.  3. Left atrial size was mildly dilated.  4. Right atrial size was mild to moderately dilated.  5. The mitral valve is grossly normal. No evidence of mitral valve regurgitation.  6. The aortic valve is tricuspid. Aortic valve regurgitation is trivial.  7. The inferior vena cava is normal in size with greater than 50% respiratory variability, suggesting right atrial pressure of 3 mmHg. FINDINGS  Left Ventricle: Left ventricular ejection fraction, by estimation, is 35 to 40%. The left ventricle has moderately decreased function. The left ventricle demonstrates global hypokinesis. Definity contrast agent was given IV to delineate the left ventricular endocardial borders. The left ventricular internal cavity size was normal in size. There is mild left ventricular hypertrophy. Left ventricular diastolic parameters are consistent with Grade I diastolic dysfunction (impaired relaxation). Right Ventricle: The right ventricular size is moderately enlarged. Right vetricular wall thickness was not assessed. Right ventricular systolic function is mildly reduced. Left Atrium: Left atrial size was mildly dilated. Right Atrium: Right atrial size was mild to moderately dilated. Pericardium: There is no evidence of pericardial effusion. Mitral Valve: The mitral valve is grossly normal. No evidence of mitral valve regurgitation. MV peak gradient, 2.7 mmHg. The mean mitral valve gradient is 2.0 mmHg. Tricuspid Valve: The tricuspid valve is not well visualized. Tricuspid valve regurgitation is not demonstrated. Aortic Valve: The aortic valve is tricuspid. Aortic valve regurgitation is trivial. Aortic regurgitation PHT measures 498 msec. Aortic valve mean gradient measures 3.0 mmHg. Aortic valve peak gradient measures 5.0 mmHg. Aortic valve area, by VTI measures  3.31 cm. Pulmonic Valve: The pulmonic valve was not well visualized.  Pulmonic valve regurgitation is not visualized. Aorta: The aortic root is normal in size and structure. Venous: The inferior vena cava is normal in size with greater than 50% respiratory variability, suggesting right atrial pressure of 3 mmHg. IAS/Shunts: No atrial level shunt detected by color flow Doppler.  LEFT VENTRICLE PLAX 2D LVIDd:         5.15 cm     Diastology LVIDs:  4.22 cm     LV e' lateral:   10.20 cm/s LV PW:         1.35 cm     LV E/e' lateral: 5.3 LV IVS:        1.13 cm     LV e' medial:    5.00 cm/s LVOT diam:     2.50 cm     LV E/e' medial:  10.8 LV SV:         58 LV SV Index:   28 LVOT Area:     4.91 cm  LV Volumes (MOD) LV vol d, MOD A2C: 84.8 ml LV vol d, MOD A4C: 86.1 ml LV vol s, MOD A2C: 41.2 ml LV vol s, MOD A4C: 54.7 ml LV SV MOD A2C:     43.6 ml LV SV MOD A4C:     86.1 ml LV SV MOD BP:      40.6 ml RIGHT VENTRICLE RV Basal diam:  5.29 cm LEFT ATRIUM           Index       RIGHT ATRIUM           Index LA diam:      4.30 cm 2.10 cm/m  RA Area:     24.30 cm LA Vol (A4C): 74.6 ml 36.42 ml/m RA Volume:   84.50 ml  41.25 ml/m  AORTIC VALVE                   PULMONIC VALVE AV Area (Vmax):    3.43 cm    PV Vmax:       0.67 m/s AV Area (Vmean):   3.55 cm    PV Vmean:      46.600 cm/s AV Area (VTI):     3.31 cm    PV VTI:        0.084 m AV Vmax:           112.00 cm/s PV Peak grad:  1.8 mmHg AV Vmean:          74.100 cm/s PV Mean grad:  1.0 mmHg AV VTI:            0.175 m AV Peak Grad:      5.0 mmHg AV Mean Grad:      3.0 mmHg LVOT Vmax:         78.30 cm/s LVOT Vmean:        53.600 cm/s LVOT VTI:          0.118 m LVOT/AV VTI ratio: 0.67 AI PHT:            498 msec  AORTA Ao Root diam: 3.60 cm MITRAL VALVE MV Area (PHT): 4.31 cm    SHUNTS MV Peak grad:  2.7 mmHg    Systemic VTI:  0.12 m MV Mean grad:  2.0 mmHg    Systemic Diam: 2.50 cm MV Vmax:       0.82 m/s MV Vmean:      57.0 cm/s MV Decel Time: 176 msec MV E velocity: 54.10 cm/s MV A velocity: 38.30 cm/s MV E/A ratio:  1.41 Kate Sable MD Electronically signed by Kate Sable MD Signature Date/Time: 06/08/2019/3:09:42 PM    Final         Scheduled Meds: . apixaban  10 mg Oral BID   Followed by  . [START ON 06/16/2019] apixaban  5 mg Oral BID  . losartan  25 mg Oral Daily  . mouth rinse  15  mL Mouth Rinse BID  . metoprolol tartrate  50 mg Oral BID  . [START ON 06/10/2019] regadenoson  0.4 mg Intravenous Once   Continuous Infusions:   LOS: 2 days    Time spent: 35 minutes    Ezekiel Slocumb, DO [facility]   If 7PM-7AM, please contact night-coverage [email or web address] 06/09/2019, 1:38 PM

## 2019-06-09 NOTE — Progress Notes (Signed)
No "informed consent details" order entered for tomorrow's stress test, thus cannot have patient sign consent form on the floor. Will need to be completed in the stress lab in the AM. Will pass along in shift report. Wenda Low [facility]

## 2019-06-09 NOTE — Progress Notes (Signed)
Patient awake, alert and oriented x 4 this am; stood for weight, but was unsteady on his feet. Tolerated well. Complains of headache, given Tylenol. Discussed etoh intake; states his last drink was Saturday, almost 5 days ago. Admits to 6-7 beers per day. Does not appear tremulous. No ativan so far this shift. CIWA score is 4, due to complaints of mod headache.   Dilt drip continues at 5 mg per hour; Afib continues with HR in the upper 80's at this time. Heparin continues at 1200 units per hour. NS infusing at 75 cc per hour.

## 2019-06-09 NOTE — Progress Notes (Signed)
Patient has been sleeping since beginning of shift; pt is easily aroused to verbal stimuli and oriented x 4. He has received no ativan thus far this shift. CIWA score = 2, but CIWA protocol not ordered. Telem reveals atrial fib; HR mostly in the 90's ranging up to 110 with stable BP. Cardizem infusing at 5 mg/hr. NS infusing at 75 ml/hr and Heparin infusing at 1200 units per hour.   Bed alarms on and patient reminded not to get OOB without assistance; states will comply.

## 2019-06-09 NOTE — Consult Note (Signed)
Cardiology Consult    Patient ID: Calvin Esparza MRN: [id number], DOB/AGE: [birth year]   Admit date: 06/07/2019 Date of Consult: 06/09/2019  Primary Physician: [name], MD Primary Cardiologist: Ida Rogue, MD Requesting Provider: Darlyne Russian, DO  Patient Profile    Calvin Esparza is a 59 y.o. male with a history of persistent atrial fibrillation, alcohol abuse, chronic pain, and COVID-19 infection in December 2020, who is being seen today for the evaluation of recurrent atrial fibrillation and newly diagnosed cardiomyopathy (EF 35-40%) in the setting of right-sided PE at the request of Dr. Arbutus Ped.  Past Medical History   Past Medical History:  Diagnosis Date  . Alcohol abuse   . Cardiomyopathy ([place])    a. 05/2019 Echo: EF 35-40%, gr1 DD. Mod enlarged RV. Mildly dil LA. Mild to mod dil RA. Triv AI.  Marland Kitchen Chronic pain   . COVID-19    a. 02/2019  . Persistent atrial fibrillation ([place])   . Pulmonary embolism ([place])    a. 05/2019 CTA Chest: mild amt of PE w/in a lower lobe branch of RPA.    History reviewed. No pertinent surgical history.   Allergies  No Known Allergies  History of Present Illness    59 year old male with the above past medical history including alcohol abuse, chronic pain, persistent atrial fibrillation, and COVID-19 infection.  He was previously diagnosed with atrial fibrillation in January 2020, when he presented with chest pain and dizziness, and was found to be in A. fib.  He was placed on beta-blocker and Xarelto therapy and subsequently followed up with Dr. Rockey Situ in July.  He was in sinus rhythm at that time.  He had run out of Xarelto and given a CHA2DS2-VASc of 0-1, it was not resumed.  Continuation of beta-blocker therapy was recommended however, patient eventually stopped that on his own.  In December 2020, he was seen in the emergency department for flulike symptoms and was diagnosed with COVID-19.  He was in sinus rhythm at that time.   Beta-blocker and Eliquis therapy were prescribed and patient took both of these for 30 days but then did not seek a refill.  He says the following Covid infection, he never fully recovered and has had some dyspnea on exertion.  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  He notes a fair amount of exertion at work and is typically able to complete his work without limitations.  Unfortunately, over the past 5 days, he has been experiencing worsening dyspnea on exertion as well as mild, constant chest tightness that was worse with deep breathing or position changes.  Because of progressive symptoms, he presented to the emergency department where he was found to be in atrial fibrillation with a rapid ventricular response and a rate of 151 bpm.  He was placed on IV diltiazem and heparin.  High-sensitivity troponins were normal.  CT angiogram of the chest was performed and showed a mild amount of pulmonary embolism within a lower lobe branch of the right pulmonary artery.  He was subsequently admitted.  Echocardiogram was carried out yesterday showing an EF of 35 to 40% with grade 1 diastolic dysfunction and moderately enlarged right heart chambers.  Currently, he denies chest pain or dyspnea.  He remains in A. fib in the 90s to low 100s.  Beta-blocker therapy has been ordered and diltiazem is being weaned.  Inpatient Medications    . apixaban  10 mg Oral BID   Followed by  . [  START ON 06/16/2019] apixaban  5 mg Oral BID  . losartan  25 mg Oral Daily  . mouth rinse  15 mL Mouth Rinse BID  . metoprolol tartrate  50 mg Oral BID  . [START ON 06/10/2019] regadenoson  0.4 mg Intravenous Once    Family History    Family History  Problem Relation Age of Onset  . Bone cancer Father   . Stroke Father   . Diabetes Sister    He indicated that his mother is alive. He indicated that his father is deceased. He indicated that both of his sisters are alive.   Social History    Social History    Socioeconomic History  . Marital status: Married    Spouse name: Not on file  . Number of children: Not on file  . Years of education: Not on file  . Highest education level: Not on file  Occupational History  . Not on file  Tobacco Use  . Smoking status: Never Smoker  . Smokeless tobacco: Current User    Types: Chew  Substance and Sexual Activity  . Alcohol use: Yes    Alcohol/week: 56.0 standard drinks    Types: 56 Cans of beer per week  . Drug use: Not Currently  . Sexual activity: Not on file  Other Topics Concern  . Not on file  Social History Narrative  . Not on file   Social Determinants of Health   Financial Resource Strain:   . Difficulty of Paying Living Expenses:   Food Insecurity:   . Worried About Charity fundraiser in the Last Year:   . Arboriculturist in the Last Year:   Transportation Needs:   . Film/video editor (Medical):   Marland Kitchen Lack of Transportation (Non-Medical):   Physical Activity:   . Days of Exercise per Week:   . Minutes of Exercise per Session:   Stress:   . Feeling of Stress :   Social Connections:   . Frequency of Communication with Friends and Family:   . Frequency of Social Gatherings with Friends and Family:   . Attends Religious Services:   . Active Member of Clubs or Organizations:   . Attends Archivist Meetings:   Marland Kitchen Marital Status:   Intimate Partner Violence:   . Fear of Current or Ex-Partner:   . Emotionally Abused:   Marland Kitchen Physically Abused:   . Sexually Abused:      Review of Systems    General:  No chills, fever, night sweats or weight changes.  Cardiovascular:  +++ chest pain and dyspnea on exertion leading to admission - improved currently.  No edema, orthopnea, palpitations, paroxysmal nocturnal dyspnea. Dermatological: No rash, lesions/masses Respiratory: No cough, +++ dyspnea Urologic: No hematuria, dysuria Abdominal:   No nausea, vomiting, diarrhea, bright red blood per rectum, melena, or  hematemesis Neurologic:  No visual changes, wkns, changes in mental status. All other systems reviewed and are otherwise negative except as noted above.  Physical Exam    Blood pressure 131/89, pulse 83, temperature 98.4 F (36.9 C), temperature source Oral, resp. rate 18, height 5\' 10"  (1.778 m), weight 87.6 kg, SpO2 96 %.  General: Pleasant, NAD Psych: Normal affect. Neuro: Alert and oriented X 3. Moves all extremities spontaneously. HEENT: Normal  Neck: Supple without bruits or JVD. Lungs:  Resp regular and unlabored, CTA. Heart: RRR no s3, s4, or murmurs. Abdomen: Soft, non-tender, non-distended, BS + x 4.  Extremities: No clubbing, cyanosis or  edema. DP/PT/Radials 2+ and equal bilaterally.  Labs    Cardiac Enzymes Recent Labs  Lab 06/07/19 1636 06/07/19 1830  TROPONINIHS 8 8      Lab Results  Component Value Date   WBC 4.2 06/09/2019   HGB 11.2 (L) 06/09/2019   HCT 35.3 (L) 06/09/2019   MCV 98.6 06/09/2019   PLT 126 (L) 06/09/2019    Recent Labs  Lab 06/07/19 1636 06/08/19 0211 06/09/19 0547  NA 140   < > 141  K 3.6   < > 3.1*  CL 103   < > 116*  CO2 24   < > 21*  BUN 15   < > 9  CREATININE 1.12   < > 0.62  CALCIUM 9.9   < > 7.1*  PROT 8.2*  --   --   BILITOT 1.5*  --   --   ALKPHOS 59  --   --   ALT 24  --   --   AST 31  --   --   GLUCOSE 125*   < > 94   < > = values in this interval not displayed.    Radiology Studies    DG Chest 2 View  Result Date: 06/07/2019 CLINICAL DATA:  Shortness of breath. EXAM: CHEST - 2 VIEW COMPARISON:  March 14, 2019 FINDINGS: The heart size and mediastinal contours are within normal limits. Both lungs are clear. A chronic seventh right rib fracture is seen. Degenerative changes seen throughout the thoracic spine. IMPRESSION: No active cardiopulmonary disease. Electronically Signed   By: Virgina Norfolk M.D.   On: 06/07/2019 17:26   CT Angio Chest PE W and/or Wo Contrast  Result Date: 06/07/2019 CLINICAL DATA:   Shortness of breath. EXAM: CT ANGIOGRAPHY CHEST WITH CONTRAST TECHNIQUE: Multidetector CT imaging of the chest was performed using the standard protocol during bolus administration of intravenous contrast. Multiplanar CT image reconstructions and MIPs were obtained to evaluate the vascular anatomy. CONTRAST:  27mL OMNIPAQUE IOHEXOL 350 MG/ML SOLN COMPARISON:  None. FINDINGS: Cardiovascular: Satisfactory opacification of the pulmonary arteries to the segmental level. A mild amount of intraluminal low attenuation is seen within a lower lobe branch of the right pulmonary artery (axial CT images 52 through 54, CT series number 4/coronal reformatted images 54 through 56, CT series number 7). Normal heart size. No pericardial effusion. Mediastinum/Nodes: No enlarged mediastinal, hilar, or axillary lymph nodes. Thyroid gland, trachea, and esophagus demonstrate no significant findings. Lungs/Pleura: Lungs are clear. No pleural effusion or pneumothorax. Upper Abdomen: No acute abnormality. Musculoskeletal: No chest wall abnormality. No acute or significant osseous findings. Review of the MIP images confirms the above findings. IMPRESSION: A mild amount of pulmonary embolism within a lower lobe branch of the right pulmonary artery. Electronically Signed   By: Virgina Norfolk M.D.   On: 06/07/2019 18:42   US Venous Img Lower Bilateral (DVT)  Result Date: 06/07/2019 CLINICAL DATA:  Initial evaluation for pulmonary embolus. EXAM: BILATERAL LOWER EXTREMITY VENOUS DOPPLER ULTRASOUND TECHNIQUE: Gray-scale sonography with graded compression, as well as color Doppler and duplex ultrasound were performed to evaluate the lower extremity deep venous systems from the level of the common femoral vein and including the common femoral, femoral, profunda femoral, popliteal and calf veins including the posterior tibial, peroneal and gastrocnemius veins when visible. The superficial great saphenous vein was also interrogated. Spectral  Doppler was utilized to evaluate flow at rest and with distal augmentation maneuvers in the common femoral, femoral and popliteal veins. COMPARISON:  Prior CTA of the chest from earlier the same day. FINDINGS: RIGHT LOWER EXTREMITY Common Femoral Vein: No evidence of thrombus. Normal compressibility, respiratory phasicity and response to augmentation. Saphenofemoral Junction: No evidence of thrombus. Normal compressibility and flow on color Doppler imaging. Profunda Femoral Vein: No evidence of thrombus. Normal compressibility and flow on color Doppler imaging. Femoral Vein: No evidence of thrombus. Normal compressibility, respiratory phasicity and response to augmentation. Popliteal Vein: No evidence of thrombus. Normal compressibility, respiratory phasicity and response to augmentation. Calf Veins: No evidence of thrombus. Normal compressibility and flow on color Doppler imaging. Superficial Great Saphenous Vein: No evidence of thrombus. Normal compressibility. Venous Reflux:  None. Other Findings:  None. LEFT LOWER EXTREMITY Common Femoral Vein: No evidence of thrombus. Normal compressibility, respiratory phasicity and response to augmentation. Saphenofemoral Junction: No evidence of thrombus. Normal compressibility and flow on color Doppler imaging. Profunda Femoral Vein: No evidence of thrombus. Normal compressibility and flow on color Doppler imaging. Femoral Vein: No evidence of thrombus. Normal compressibility, respiratory phasicity and response to augmentation. Popliteal Vein: No evidence of thrombus. Normal compressibility, respiratory phasicity and response to augmentation. Calf Veins: No evidence of thrombus. Normal compressibility and flow on color Doppler imaging. Superficial Great Saphenous Vein: No evidence of thrombus. Normal compressibility. Venous Reflux:  None. Other Findings: Multiple thrombosed superficial varicose veins seen within the medial left lower leg just below the knee. IMPRESSION: 1.  No evidence of deep venous thrombosis in either lower extremity. 2. Multiple thrombosed superficial varicose veins at the medial aspect of the left lower leg. Electronically Signed   By: Jeannine Boga M.D.   On: 06/07/2019 22:58   ECG & Cardiac Imaging    Atrial fibrillation with rapid ventricular response, 151, left axis deviation, nonspecific ST changes - personally reviewed.  Assessment & Plan    1.  Right-sided pulmonary embolism: Patient presented with a several day history of progressive dyspnea on exertion and constant chest tightness with a pleuritic component and was found on CTA to have a mild amount of pulmonary embolism within the lower branch of the right pulmonary artery.  Lower extremity ultrasound was negative for DVT.  Echo shows moderately enlarged right-sided chambers.  He is currently being transitioned from heparin to Eliquis therapy.  Patient denies any prolonged periods of inactivity.  2.  Atrial fibrillation with rapid ventricular response: Patient with prior history of persistent atrial fibrillation initially diagnosed in June 2020.  He was previous prescribed beta-blocker therapy on 2 occasions but self discontinued.  In the setting of above, he presented in A. fib with RVR with rates in the 150s.  Rate has been better on diltiazem 5 mg IV however, in the setting of newly diagnosed cardiomyopathy, diltiazem is not ideal.  Switching over to oral metoprolol today-50 mg twice daily, which we can hopefully consolidate to Toprol-XL 100 mg daily tomorrow.  CHA2DS2-VASc equals 1-2 in the setting of LV dysfunction and potentially untreated hypertension.  As above, he will be receiving anticoagulation for PE.  Would plan to continue Eliquis 5 twice daily for stroke risk reduction going forward.  Provided that he can be adequately rate controlled on beta-blocker therapy, we can forego TEE cardioversion at this time and if he remains in atrial fibrillation after 4 weeks of persistent  anticoagulation, we can reconsider outpatient cardioversion.  3.  Cardiomyopathy: EF 35 to 40% with grade 1 diastolic dysfunction by echo this admission.  No prior known history of cardiomyopathy or heart failure symptoms.  He was supposed  to have had an echocardiogram in July at the time of his A. fib diagnosis but he did not show for this.  He is euvolemic on examination.  Etiology of cardiomyopathy unclear and can certainly be tachycardia mediated in the setting of A. fib of unknown duration versus alcoholic in nature.  I am encouraged by normal troponins and lack of coronary calcium however, in the setting of new diagnosis, we will pursue ischemic testing with a Lexiscan Myoview in the a.m.  Switching to oral beta-blocker as above with plan to consolidate to metoprolol succinate if tolerated.  Likewise, we will add low-dose ARB therapy.  4.  Alcohol abuse: Drinking 6-8 beers daily for the past 30 years.  No current signs of withdrawal.  Complete cessation advised in the setting of above.  5.  Hypokalemia: Potassium 3.1 this morning.  Supplementation has been ordered.  6.  Hypomagnesemia:  Mildly low @ 1.7 in the setting of hypokalemia and afib.  Agree w/ supplementation.  Signed, Murray Hodgkins, NP 06/09/2019, 1:45 PM  For questions or updates, please contact   Please consult [email or web address] for contact info under Cardiology/STEMI.

## 2019-06-09 NOTE — Plan of Care (Signed)
  Problem: Education: Goal: Knowledge of General Education information will improve Description: Including pain rating scale, medication(s)/side effects and non-pharmacologic comfort measures Outcome: Progressing   Problem: Health Behavior/Discharge Planning: Goal: Ability to manage health-related needs will improve Outcome: Progressing Note: Patient for a stress test in the AM. To be consented later. Patient aware. Will continue to monitor overall status for the remainder of the shift. Wenda Low [facility]

## 2019-06-09 NOTE — Consult Note (Signed)
ANTICOAGULATION CONSULT NOTE  Pharmacy Consult for Heparin infusion Indication: pulmonary embolus  No Known Allergies  Patient Measurements: Height: 5\' 10"  (177.8 cm) Weight: 193 lb 1.6 oz (87.6 kg) IBW/kg (Calculated) : 73 Heparin Dosing Weight: 86.2 kg  Vital Signs: Temp: 98 F (36.7 C) (03/25 0459) Temp Source: Oral (03/25 0459) BP: 125/88 (03/25 0510) Pulse Rate: 88 (03/25 0510)  Labs: Recent Labs    06/07/19 1636 06/07/19 1636 06/07/19 1830 06/08/19 0211 06/08/19 0211 06/08/19 0906 06/08/19 1457 06/09/19 0547  HGB 15.7   < >  --  13.7  --   --   --  11.2*  HCT 47.7  --   --  41.0  --   --   --  35.3*  PLT 197  --   --  167  --   --   --  126*  APTT 26  --   --   --   --   --   --   --   LABPROT 12.8  --   --   --   --   --   --   --   INR 1.0  --   --   --   --   --   --   --   HEPARINUNFRC  --   --   --  0.83*   < > 0.69 0.57 1.41*  CREATININE 1.12  --   --  0.98  --   --   --  0.62  TROPONINIHS 8  --  8  --   --   --   --   --    < > = values in this interval not displayed.    Estimated Creatinine Clearance: 113.5 mL/min (by C-G formula based on SCr of 0.62 mg/dL).   Medical History: Past Medical History:  Diagnosis Date  . A-fib ([place])     Medications:  No anticoagulation prior to admission. Hx of rivaroxaban in the past but not taking.   Assessment: Patient is a 59 y/o M with a medical history including atrial fibrillation and alcohol abuse who presented with shortness of breath and mid-sternal chest pain for several days. CTA positive for PE. Pharmacy consulted to initiate heparin infusion.  Baseline H&H within normal limits. Baseline coags within normal limits.  3/24 0906 HL 0.69  3/24 1457 HL 0.57   Goal of Therapy:  Heparin level 0.3-0.7 units/ml Monitor platelets by anticoagulation protocol: Yes   Plan:  03/25 @ 0600 HL 1.41 supratherapeutic. Per RN no bleeding, will hold drip for one hour and restart at 0845 at a rate of 800 units/hr  and will recheck HL at 1500, CBC trending down will continue to monitor.  Tobie Lords, PharmD, BCPS Clinical Pharmacist 06/09/2019,7:38 AM

## 2019-06-10 ENCOUNTER — Inpatient Hospital Stay (HOSPITAL_COMMUNITY): Payer: 59

## 2019-06-10 DIAGNOSIS — I4891 Unspecified atrial fibrillation: Secondary | ICD-10-CM | POA: Diagnosis not present

## 2019-06-10 DIAGNOSIS — I2699 Other pulmonary embolism without acute cor pulmonale: Principal | ICD-10-CM

## 2019-06-10 DIAGNOSIS — I420 Dilated cardiomyopathy: Secondary | ICD-10-CM

## 2019-06-10 LAB — CBC WITH DIFFERENTIAL/PLATELET
Abs Immature Granulocytes: 0.02 10*3/uL (ref 0.00–0.07)
Basophils Absolute: 0 10*3/uL (ref 0.0–0.1)
Basophils Relative: 0 %
Eosinophils Absolute: 0.1 10*3/uL (ref 0.0–0.5)
Eosinophils Relative: 1 %
HCT: 40.8 % (ref 39.0–52.0)
Hemoglobin: 13.5 g/dL (ref 13.0–17.0)
Immature Granulocytes: 0 %
Lymphocytes Relative: 19 %
Lymphs Abs: 1.5 10*3/uL (ref 0.7–4.0)
MCH: 32 pg (ref 26.0–34.0)
MCHC: 33.1 g/dL (ref 30.0–36.0)
MCV: 96.7 fL (ref 80.0–100.0)
Monocytes Absolute: 0.4 10*3/uL (ref 0.1–1.0)
Monocytes Relative: 5 %
Neutro Abs: 5.9 10*3/uL (ref 1.7–7.7)
Neutrophils Relative %: 75 %
Platelets: 147 10*3/uL — ABNORMAL LOW (ref 150–400)
RBC: 4.22 MIL/uL (ref 4.22–5.81)
RDW: 13.4 % (ref 11.5–15.5)
WBC: 7.9 10*3/uL (ref 4.0–10.5)
nRBC: 0 % (ref 0.0–0.2)

## 2019-06-10 LAB — BASIC METABOLIC PANEL
Anion gap: 8 (ref 5–15)
BUN: 14 mg/dL (ref 6–20)
CO2: 22 mmol/L (ref 22–32)
Calcium: 9.1 mg/dL (ref 8.9–10.3)
Chloride: 110 mmol/L (ref 98–111)
Creatinine, Ser: 0.88 mg/dL (ref 0.61–1.24)
GFR calc Af Amer: >60 mL/min (ref >60)
GFR calc non Af Amer: >60 mL/min (ref >60)
Glucose, Bld: 112 mg/dL — ABNORMAL HIGH (ref 70–99)
Potassium: 4.3 mmol/L (ref 3.5–5.1)
Sodium: 140 mmol/L (ref 135–145)

## 2019-06-10 LAB — NM MYOCAR MULTI W/SPECT W/WALL MOTION / EF
Estimated workload: 1 METS
Exercise duration (min): 0 min
Exercise duration (sec): 0 s
LV dias vol: 92 mL (ref 62–150)
LV sys vol: 56 mL
MPHR: 163 {beats}/min
Peak HR: 157 {beats}/min
Percent HR: 96 %
Rest HR: 117 {beats}/min
SDS: 0
SRS: 1
SSS: 0
TID: 1.03

## 2019-06-10 LAB — MAGNESIUM: Magnesium: 2.2 mg/dL (ref 1.7–2.4)

## 2019-06-10 MED ORDER — TECHNETIUM TC 99M TETROFOSMIN IV KIT
10.9700 | PACK | Freq: Once | INTRAVENOUS | Status: AC | PRN
  Administered 2019-06-10: 10.97 via INTRAVENOUS

## 2019-06-10 MED ORDER — DIGOXIN 250 MCG PO TABS
0.2500 mg | ORAL_TABLET | Freq: Every day | ORAL | Status: DC
  Administered 2019-06-11: 0.25 mg via ORAL
  Filled 2019-06-10: qty 1

## 2019-06-10 MED ORDER — TECHNETIUM TC 99M TETROFOSMIN IV KIT
31.2000 | PACK | Freq: Once | INTRAVENOUS | Status: AC | PRN
  Administered 2019-06-10: 31.2 via INTRAVENOUS

## 2019-06-10 MED ORDER — METOPROLOL TARTRATE 25 MG PO TABS
25.0000 mg | ORAL_TABLET | Freq: Once | ORAL | Status: AC
  Administered 2019-06-10: 25 mg via ORAL
  Filled 2019-06-10: qty 1

## 2019-06-10 MED ORDER — DILTIAZEM HCL ER COATED BEADS 180 MG PO CP24: 180.0000 mg | ORAL_CAPSULE | Freq: Every day | ORAL | Status: DC

## 2019-06-10 MED ORDER — METOPROLOL TARTRATE 50 MG PO TABS
100.0000 mg | ORAL_TABLET | Freq: Two times a day (BID) | ORAL | Status: DC
  Administered 2019-06-10 – 2019-06-11 (×2): 100 mg via ORAL
  Filled 2019-06-10 (×2): qty 2

## 2019-06-10 MED ORDER — DILTIAZEM HCL 30 MG PO TABS
60.0000 mg | ORAL_TABLET | Freq: Four times a day (QID) | ORAL | Status: DC
  Administered 2019-06-10 – 2019-06-11 (×4): 60 mg via ORAL
  Filled 2019-06-10 (×4): qty 2

## 2019-06-10 MED ORDER — DIGOXIN 0.25 MG/ML IJ SOLN
0.5000 mg | Freq: Once | INTRAMUSCULAR | Status: AC
  Administered 2019-06-10: 0.5 mg via INTRAVENOUS
  Filled 2019-06-10: qty 2

## 2019-06-10 NOTE — Progress Notes (Signed)
Pt has required 3 PRN doses of IV Metoprolol for afib rvr HR above 110 tonight in addition to his oral dose last night.  Pt denies pain or distress.  NPO P MN for stress test today.

## 2019-06-10 NOTE — Progress Notes (Signed)
Progress Note  Patient Name: Calvin Esparza Date of Encounter: 06/10/2019  Primary Cardiologist: Ida Rogue, MD  Subjective   No chest pain.  Didn't sleep well.  Feels a little short of breath this AM.  HRs trending 1-teens currently.  Inpatient Medications    Scheduled Meds: . apixaban  10 mg Oral BID   Followed by  . [START ON 06/16/2019] apixaban  5 mg Oral BID  . losartan  25 mg Oral Daily  . mouth rinse  15 mL Mouth Rinse BID  . metoprolol tartrate  75 mg Oral BID  . regadenoson  0.4 mg Intravenous Once   Continuous Infusions:  PRN Meds: acetaminophen, LORazepam, metoprolol tartrate, ondansetron (ZOFRAN) IV   Vital Signs    Vitals:   06/09/19 1957 06/10/19 0422 06/10/19 0600 06/10/19 0817  BP:  (!) 141/113  (!) 146/101  Pulse:  99 (!) 123 (!) 121  Resp:    18  Temp: 97.8 F (36.6 C) 99.3 F (37.4 C)  98.6 F (37 C)  TempSrc: Oral Oral  Oral  SpO2:  96%  96%  Weight:  88.4 kg    Height:        Intake/Output Summary (Last 24 hours) at 06/10/2019 0819 Last data filed at 06/10/2019 0601 Gross per 24 hour  Intake 660 ml  Output 200 ml  Net 460 ml   Filed Weights   06/08/19 0150 06/09/19 0500 06/10/19 0422  Weight: 86.8 kg 87.6 kg 88.4 kg    Physical Exam   GEN: Well nourished, well developed, in no acute distress.  HEENT: Grossly normal.  Neck: Supple, no JVD, carotid bruits, or masses. Cardiac: IR, IR, tachy, no murmurs, rubs, or gallops. No clubbing, cyanosis, edema.  Radials/DP/PT 2+ and equal bilaterally.  Respiratory:  Respirations regular and unlabored, clear to auscultation bilaterally. GI: Soft, nontender, nondistended, BS + x 4. MS: no deformity or atrophy. Skin: warm and dry, no rash. Neuro:  Strength and sensation are intact. Psych: AAOx3.  Normal affect.  Labs    Chemistry Recent Labs  Lab 06/07/19 1636 06/07/19 1636 06/08/19 0211 06/09/19 0547 06/10/19 0512  NA 140   < > 140 141 140  K 3.6   < > 3.2* 3.1* 4.3  CL 103    < > 104 116* 110  CO2 24   < > 26 21* 22  GLUCOSE 125*   < > 115* 94 112*  BUN 15   < > 16 9 14   CREATININE 1.12   < > 0.98 0.62 0.88  CALCIUM 9.9   < > 8.7* 7.1* 9.1  PROT 8.2*  --   --   --   --   ALBUMIN 4.6  --   --   --   --   AST 31  --   --   --   --   ALT 24  --   --   --   --   ALKPHOS 59  --   --   --   --   BILITOT 1.5*  --   --   --   --   GFRNONAA >60   < > >60 >60 >60  GFRAA >60   < > >60 >60 >60  ANIONGAP 13   < > 10 4* 8   < > = values in this interval not displayed.     Hematology Recent Labs  Lab 06/08/19 0211 06/09/19 0547 06/10/19 0512  WBC 5.4 4.2 7.9  RBC  4.32 3.58* 4.22  HGB 13.7 11.2* 13.5  HCT 41.0 35.3* 40.8  MCV 94.9 98.6 96.7  MCH 31.7 31.3 32.0  MCHC 33.4 31.7 33.1  RDW 13.7 13.6 13.4  PLT 167 126* 147*    Cardiac Enzymes  Recent Labs  Lab 06/07/19 1636 06/07/19 1830  TROPONINIHS 8 8      Radiology    DG Chest 2 View  Result Date: 06/07/2019 CLINICAL DATA:  Shortness of breath. EXAM: CHEST - 2 VIEW COMPARISON:  March 14, 2019 FINDINGS: The heart size and mediastinal contours are within normal limits. Both lungs are clear. A chronic seventh right rib fracture is seen. Degenerative changes seen throughout the thoracic spine. IMPRESSION: No active cardiopulmonary disease. Electronically Signed   By: Virgina Norfolk M.D.   On: 06/07/2019 17:26   CT Angio Chest PE W and/or Wo Contrast  Result Date: 06/07/2019 CLINICAL DATA:  Shortness of breath. EXAM: CT ANGIOGRAPHY CHEST WITH CONTRAST TECHNIQUE: Multidetector CT imaging of the chest was performed using the standard protocol during bolus administration of intravenous contrast. Multiplanar CT image reconstructions and MIPs were obtained to evaluate the vascular anatomy. CONTRAST:  33mL OMNIPAQUE IOHEXOL 350 MG/ML SOLN COMPARISON:  None. FINDINGS: Cardiovascular: Satisfactory opacification of the pulmonary arteries to the segmental level. A mild amount of intraluminal low attenuation is  seen within a lower lobe branch of the right pulmonary artery (axial CT images 52 through 54, CT series number 4/coronal reformatted images 54 through 56, CT series number 7). Normal heart size. No pericardial effusion. Mediastinum/Nodes: No enlarged mediastinal, hilar, or axillary lymph nodes. Thyroid gland, trachea, and esophagus demonstrate no significant findings. Lungs/Pleura: Lungs are clear. No pleural effusion or pneumothorax. Upper Abdomen: No acute abnormality. Musculoskeletal: No chest wall abnormality. No acute or significant osseous findings. Review of the MIP images confirms the above findings. IMPRESSION: A mild amount of pulmonary embolism within a lower lobe branch of the right pulmonary artery. Electronically Signed   By: Virgina Norfolk M.D.   On: 06/07/2019 18:42   US Venous Img Lower Bilateral (DVT)  Result Date: 06/07/2019 CLINICAL DATA:  Initial evaluation for pulmonary embolus. EXAM: BILATERAL LOWER EXTREMITY VENOUS DOPPLER ULTRASOUND TECHNIQUE: Gray-scale sonography with graded compression, as well as color Doppler and duplex ultrasound were performed to evaluate the lower extremity deep venous systems from the level of the common femoral vein and including the common femoral, femoral, profunda femoral, popliteal and calf veins including the posterior tibial, peroneal and gastrocnemius veins when visible. The superficial great saphenous vein was also interrogated. Spectral Doppler was utilized to evaluate flow at rest and with distal augmentation maneuvers in the common femoral, femoral and popliteal veins. COMPARISON:  Prior CTA of the chest from earlier the same day. FINDINGS: RIGHT LOWER EXTREMITY Common Femoral Vein: No evidence of thrombus. Normal compressibility, respiratory phasicity and response to augmentation. Saphenofemoral Junction: No evidence of thrombus. Normal compressibility and flow on color Doppler imaging. Profunda Femoral Vein: No evidence of thrombus. Normal  compressibility and flow on color Doppler imaging. Femoral Vein: No evidence of thrombus. Normal compressibility, respiratory phasicity and response to augmentation. Popliteal Vein: No evidence of thrombus. Normal compressibility, respiratory phasicity and response to augmentation. Calf Veins: No evidence of thrombus. Normal compressibility and flow on color Doppler imaging. Superficial Great Saphenous Vein: No evidence of thrombus. Normal compressibility. Venous Reflux:  None. Other Findings:  None. LEFT LOWER EXTREMITY Common Femoral Vein: No evidence of thrombus. Normal compressibility, respiratory phasicity and response to augmentation. Saphenofemoral Junction: No  evidence of thrombus. Normal compressibility and flow on color Doppler imaging. Profunda Femoral Vein: No evidence of thrombus. Normal compressibility and flow on color Doppler imaging. Femoral Vein: No evidence of thrombus. Normal compressibility, respiratory phasicity and response to augmentation. Popliteal Vein: No evidence of thrombus. Normal compressibility, respiratory phasicity and response to augmentation. Calf Veins: No evidence of thrombus. Normal compressibility and flow on color Doppler imaging. Superficial Great Saphenous Vein: No evidence of thrombus. Normal compressibility. Venous Reflux:  None. Other Findings: Multiple thrombosed superficial varicose veins seen within the medial left lower leg just below the knee. IMPRESSION: 1. No evidence of deep venous thrombosis in either lower extremity. 2. Multiple thrombosed superficial varicose veins at the medial aspect of the left lower leg. Electronically Signed   By: Jeannine Boga M.D.   On: 06/07/2019 22:58    Telemetry    Afib, 1-teens to 120's - Personally Reviewed  Cardiac Studies   2D Echocardiogram 3.23.21   1. Left ventricular ejection fraction, by estimation, is 35 to 40%. The  left ventricle has moderately decreased function. The left ventricle  demonstrates  global hypokinesis. There is mild left ventricular  hypertrophy. Left ventricular diastolic  parameters are consistent with Grade I diastolic dysfunction (impaired  relaxation).   2. Right ventricular systolic function is mildly reduced. The right  ventricular size is moderately enlarged.   3. Left atrial size was mildly dilated.   4. Right atrial size was mild to moderately dilated.   5. The mitral valve is grossly normal. No evidence of mitral valve  regurgitation.   6. The aortic valve is tricuspid. Aortic valve regurgitation is trivial.   7. The inferior vena cava is normal in size with greater than 50%  respiratory variability, suggesting right atrial pressure of 3 mmHg.    Patient Profile     59 y.o. male with a history of persistent atrial fibrillation, alcohol abuse, chronic pain, and COVID-19 infection in December 2020, who is being seen today for the evaluation of recurrent atrial fibrillation and newly diagnosed cardiomyopathy (EF 35-40%) in the setting of right-sided PE   Assessment & Plan    1.  Right sided PE:  Mild dyspnea this AM in setting of higher heart rates after transitioning from IV dilt to  blocker yesterday.  Cont oral anticoagulation.  2.  Persistent Afib w/ RVR:  Originally dx in 08/2018.  He previously self-d/c'd oral anticoag and  blocker therapy.  Presented in Afib w/ RVR in the setting of above.   blocker and eliquis resumed (CHA2DS2VASc = 2).  Rates elevated - 1-teens to 120's after transitioning off of IV dilt yesterday.  Titrate metoprolol to 100mg  BID and will load w/ digoxin this AM.  Of note, rate previously controlled on lopressor 50 bid, thus current elevation in rates may be driven by PE, etoh w/d, and/or LV dysfxn.  Hope to achieve rate control and consider DCCV in 4 wks if necessary.  3. Cardiomyopathy:  EF 35-40% w/ grade 1 diast dysfxn.  Euvolemic on exam.  ? Tachy mediated vs etoh vs ischemic.  Despite c/p, PE, rapid rates, troponins nl, thus  ischemic etiology seems less likely.  Cont  blocker and ARB w/ plan to transition to long-acting metoprolol prior to discharge.  Adding digoxin as above. Follow bp w/ adjustment to  blocker.  Likely room to transition losartan to entresto.  Will look to add spiro as outpt.  4.  ETOH abuse:  6-8 beers/day x 30 yrs.  Complete cessation  advised.  He denies symptoms of w/d.  5.  Hypokalemia:  Nl this AM.  6.  Hypomagnesemia:  Nl this AM.  Signed, Murray Hodgkins, NP  06/10/2019, 8:19 AM    For questions or updates, please contact   Please consult [email or web address] for contact info under Cardiology/STEMI.

## 2019-06-10 NOTE — Progress Notes (Addendum)
PROGRESS NOTE    Calvin Esparza  [id number] DOB: Jun 26, 1960 DOA: 06/07/2019  PCP: [name], MD    LOS - 3   Brief Narrative:  [name] a58 y.o.Caucasian malewith a history of COVID-19 in December 2020, presented to theED on 3/23with worsening dyspnea over the last couple of days with associated palpitations. In the ED, hypertensive 155/105, tachycardic HR 128, tachypneic RR 23, no hypoxia. Labs showed K 3.4, normal troponin x2, normal CMP and CBC. CXR negative. CTA chest showed mild amount of pulmonary embolism within a lower lobe branch of the right pulmonary artery.EKG showed A. fib with RVR, rate 151. Patient was started on heparin drip, IV Cardizem for heart rate control. Admitted to hospitalist service.  Subjective 3/26: Patient seen after Lexiscan today.  He reports some ongoing shortness of breath on exertion but denies other complaints including chest pain, palpitations, shortness of breath at rest, fevers or chills, nausea vomiting or diarrhea.  No acute events reported overnight.   Assessment & Plan:   Principal Problem:   Atrial fibrillation with RVR ([place]) Active Problems:   Acute pulmonary embolism ([facility])   Alcohol abuse   Dilated cardiomyopathy ([place])   Acute pulmonary embolism-seems unprovoked, however patient did have COVID-19 in December.Lower extremity duplex ultrasounds negative for DVTs bilaterally. Echocardiogram showed EF of 35 to 40%, LV global hypokinesis, grade 1 diastolic dysfunction, no right heart strain.No prior echo to compare EF.  Off heparin drip. --Continue Eliquis  --telemetry monitoring  A. fib with RVR-new onset,likely paroxysmal and provoked by acute PE.  Required going back on Cardizem drip on 3/24, HR uncontrolled on initial dose of metoprolol --Cardiology following --Increase oral metoprolol to 100 mg BID --Digoxin started today, not much improvement, continue --Added diltiazem 60 mg p.o. 4  times daily as well --IV Lopressor as needed for heart rate sustained over 110 at rest --maintain K>4.0 and Mg>2.0 --Discharge on Eliquis for A. fib and PE --Telemetry monitoring --Follow-up Lexiscan results   Chronic mixed systolic/diastolic CHF- euvolemic, compensated.Echo on 3/24 showed EF 35 to 40% with LV global hypokinesis and grade 1 diastolic dysfunction.  Suspect reduced EF is due to A. fib RVR --continue beta blocker as above   Alcohol abuse --CIWA protocol with as needed Ativan   DVT prophylaxis: on Eliquis   Code Status: Full Code  Family Communication: None at bedside during encounter, patient to update.  Disposition Plan: Discharge home pending cardiology clearance and heart rate controlled on p.o. meds  Coming From home Exp DC Date 3/27 Barriers as above Medically Stable for Discharge?  No  Consultants:   Cardiology  Procedures:   None  Antimicrobials:   None   Objective: Vitals:   06/09/19 1957 06/10/19 0422 06/10/19 0600 06/10/19 0817  BP:  (!) 141/113  (!) 146/101  Pulse:  99 (!) 123 (!) 121  Resp:    18  Temp: 97.8 F (36.6 C) 99.3 F (37.4 C)  98.6 F (37 C)  TempSrc: Oral Oral  Oral  SpO2:  96%  96%  Weight:  88.4 kg    Height:        Intake/Output Summary (Last 24 hours) at 06/10/2019 0834 Last data filed at 06/10/2019 0820 Gross per 24 hour  Intake 660 ml  Output 400 ml  Net 260 ml   Filed Weights   06/08/19 0150 06/09/19 0500 06/10/19 0422  Weight: 86.8 kg 87.6 kg 88.4 kg    Examination:  General exam: awake, alert, no acute distress Respiratory system:  Decreased breath sounds but clear bilaterally, no wheezes, rales or rhonchi, normal respiratory effort. Cardiovascular system: normal S1/S2, irregularly irregular, no pedal edema.   Central nervous system: A&O x4. no gross focal neurologic deficits, normal speech Extremities: moves all, no edema, normal tone Skin: dry, intact, normal temperature, normal  color Psychiatry: normal mood, congruent affect, judgement and insight appear normal    Data Reviewed: I have personally reviewed following labs and imaging studies  CBC: Recent Labs  Lab 06/07/19 1636 06/08/19 0211 06/09/19 0547 06/10/19 0512  WBC 6.3 5.4 4.2 7.9  NEUTROABS  --  3.0 2.4 5.9  HGB 15.7 13.7 11.2* 13.5  HCT 47.7 41.0 35.3* 40.8  MCV 95.6 94.9 98.6 96.7  PLT 197 167 126* [id number]*   Basic Metabolic Panel: Recent Labs  Lab 06/07/19 1636 06/07/19 1647 06/08/19 0211 06/09/19 0547 06/10/19 0512  NA 140  --  140 141 140  K 3.6  --  3.2* 3.1* 4.3  CL 103  --  104 116* 110  CO2 24  --  26 21* 22  GLUCOSE 125*  --  115* 94 112*  BUN 15  --  16 9 14   CREATININE 1.12  --  0.98 0.62 0.88  CALCIUM 9.9  --  8.7* 7.1* 9.1  MG  --  2.0  --  1.7 2.2   GFR: Estimated Creatinine Clearance: 103.8 mL/min (by C-G formula based on SCr of 0.88 mg/dL). Liver Function Tests: Recent Labs  Lab 06/07/19 1636  AST 31  ALT 24  ALKPHOS 59  BILITOT 1.5*  PROT 8.2*  ALBUMIN 4.6   No results for input(s): LIPASE, AMYLASE in the last 168 hours. No results for input(s): AMMONIA in the last 168 hours. Coagulation Profile: Recent Labs  Lab 06/07/19 1636  INR 1.0   Cardiac Enzymes: No results for input(s): CKTOTAL, CKMB, CKMBINDEX, TROPONINI in the last 168 hours. BNP (last 3 results) No results for input(s): PROBNP in the last 8760 hours. HbA1C: No results for input(s): HGBA1C in the last 72 hours. CBG: No results for input(s): GLUCAP in the last 168 hours. Lipid Profile: No results for input(s): CHOL, HDL, LDLCALC, TRIG, CHOLHDL, LDLDIRECT in the last 72 hours. Thyroid Function Tests: Recent Labs    06/07/19 1636 06/07/19 1636 06/07/19 1830  TSH 3.510   < > 1.823  FREET4 0.96  --   --    < > = values in this interval not displayed.   Anemia Panel: No results for input(s): VITAMINB12, FOLATE, FERRITIN, TIBC, IRON, RETICCTPCT in the last 72 hours. Sepsis Labs: No  results for input(s): PROCALCITON, LATICACIDVEN in the last 168 hours.  No results found for this or any previous visit (from the past 240 hour(s)).       Radiology Studies: ECHOCARDIOGRAM COMPLETE  Result Date: 06/08/2019    ECHOCARDIOGRAM REPORT   Patient Name:   Calvin Esparza Date of Exam: 06/08/2019 Medical Rec #:  [id number]        Height:       70.0 in Accession #:    [id number]       Weight:       191.3 lb Date of Birth:  01/08/1961       BSA:          2.048 m Patient Age:    46 years         BP:           122/94 mmHg Patient Gender: M  HR:           101 bpm. Exam Location:  [facility] Procedure: 2D Echo, Color Doppler, Cardiac Doppler and Intracardiac            Opacification Agent Indications:     I26.99 Pulmonary Embolus  History:         Patient has no prior history of Echocardiogram examinations.                  Arrythmias:Atrial Fibrillation. Pt tested positive for covid-19                  on 03/14/2019.  Sonographer:     Charmayne Sheer RDCS (AE) Referring Phys:  [id number] Arvella Merles MANSY Diagnosing Phys: Kate Sable MD  Sonographer Comments: Suboptimal subcostal window. IMPRESSIONS  1. Left ventricular ejection fraction, by estimation, is 35 to 40%. The left ventricle has moderately decreased function. The left ventricle demonstrates global hypokinesis. There is mild left ventricular hypertrophy. Left ventricular diastolic parameters are consistent with Grade I diastolic dysfunction (impaired relaxation).  2. Right ventricular systolic function is mildly reduced. The right ventricular size is moderately enlarged.  3. Left atrial size was mildly dilated.  4. Right atrial size was mild to moderately dilated.  5. The mitral valve is grossly normal. No evidence of mitral valve regurgitation.  6. The aortic valve is tricuspid. Aortic valve regurgitation is trivial.  7. The inferior vena cava is normal in size with greater than 50% respiratory variability, suggesting right atrial pressure  of 3 mmHg. FINDINGS  Left Ventricle: Left ventricular ejection fraction, by estimation, is 35 to 40%. The left ventricle has moderately decreased function. The left ventricle demonstrates global hypokinesis. Definity contrast agent was given IV to delineate the left ventricular endocardial borders. The left ventricular internal cavity size was normal in size. There is mild left ventricular hypertrophy. Left ventricular diastolic parameters are consistent with Grade I diastolic dysfunction (impaired relaxation). Right Ventricle: The right ventricular size is moderately enlarged. Right vetricular wall thickness was not assessed. Right ventricular systolic function is mildly reduced. Left Atrium: Left atrial size was mildly dilated. Right Atrium: Right atrial size was mild to moderately dilated. Pericardium: There is no evidence of pericardial effusion. Mitral Valve: The mitral valve is grossly normal. No evidence of mitral valve regurgitation. MV peak gradient, 2.7 mmHg. The mean mitral valve gradient is 2.0 mmHg. Tricuspid Valve: The tricuspid valve is not well visualized. Tricuspid valve regurgitation is not demonstrated. Aortic Valve: The aortic valve is tricuspid. Aortic valve regurgitation is trivial. Aortic regurgitation PHT measures 498 msec. Aortic valve mean gradient measures 3.0 mmHg. Aortic valve peak gradient measures 5.0 mmHg. Aortic valve area, by VTI measures  3.31 cm. Pulmonic Valve: The pulmonic valve was not well visualized. Pulmonic valve regurgitation is not visualized. Aorta: The aortic root is normal in size and structure. Venous: The inferior vena cava is normal in size with greater than 50% respiratory variability, suggesting right atrial pressure of 3 mmHg. IAS/Shunts: No atrial level shunt detected by color flow Doppler.  LEFT VENTRICLE PLAX 2D LVIDd:         5.15 cm     Diastology LVIDs:         4.22 cm     LV e' lateral:   10.20 cm/s LV PW:         1.35 cm     LV E/e' lateral: 5.3 LV IVS:         1.13 cm  LV e' medial:    5.00 cm/s LVOT diam:     2.50 cm     LV E/e' medial:  10.8 LV SV:         58 LV SV Index:   28 LVOT Area:     4.91 cm  LV Volumes (MOD) LV vol d, MOD A2C: 84.8 ml LV vol d, MOD A4C: 86.1 ml LV vol s, MOD A2C: 41.2 ml LV vol s, MOD A4C: 54.7 ml LV SV MOD A2C:     43.6 ml LV SV MOD A4C:     86.1 ml LV SV MOD BP:      40.6 ml RIGHT VENTRICLE RV Basal diam:  5.29 cm LEFT ATRIUM           Index       RIGHT ATRIUM           Index LA diam:      4.30 cm 2.10 cm/m  RA Area:     24.30 cm LA Vol (A4C): 74.6 ml 36.42 ml/m RA Volume:   84.50 ml  41.25 ml/m  AORTIC VALVE                   PULMONIC VALVE AV Area (Vmax):    3.43 cm    PV Vmax:       0.67 m/s AV Area (Vmean):   3.55 cm    PV Vmean:      46.600 cm/s AV Area (VTI):     3.31 cm    PV VTI:        0.084 m AV Vmax:           112.00 cm/s PV Peak grad:  1.8 mmHg AV Vmean:          74.100 cm/s PV Mean grad:  1.0 mmHg AV VTI:            0.175 m AV Peak Grad:      5.0 mmHg AV Mean Grad:      3.0 mmHg LVOT Vmax:         78.30 cm/s LVOT Vmean:        53.600 cm/s LVOT VTI:          0.118 m LVOT/AV VTI ratio: 0.67 AI PHT:            498 msec  AORTA Ao Root diam: 3.60 cm MITRAL VALVE MV Area (PHT): 4.31 cm    SHUNTS MV Peak grad:  2.7 mmHg    Systemic VTI:  0.12 m MV Mean grad:  2.0 mmHg    Systemic Diam: 2.50 cm MV Vmax:       0.82 m/s MV Vmean:      57.0 cm/s MV Decel Time: 176 msec MV E velocity: 54.10 cm/s MV A velocity: 38.30 cm/s MV E/A ratio:  1.41 Kate Sable MD Electronically signed by Kate Sable MD Signature Date/Time: 06/08/2019/3:09:42 PM    Final         Scheduled Meds: . apixaban  10 mg Oral BID   Followed by  . [START ON 06/16/2019] apixaban  5 mg Oral BID  . digoxin  0.5 mg Intravenous Once  . losartan  25 mg Oral Daily  . mouth rinse  15 mL Mouth Rinse BID  . metoprolol tartrate  100 mg Oral BID  . metoprolol tartrate  25 mg Oral Once  . regadenoson  0.4 mg Intravenous Once   Continuous  Infusions:   LOS: 3 days  Time spent: 30 minutes    Ezekiel Slocumb, DO [facility]   If 7PM-7AM, please contact night-coverage [email or web address] 06/10/2019, 8:34 AM

## 2019-06-11 DIAGNOSIS — F1010 Alcohol abuse, uncomplicated: Secondary | ICD-10-CM | POA: Diagnosis not present

## 2019-06-11 DIAGNOSIS — I4891 Unspecified atrial fibrillation: Secondary | ICD-10-CM | POA: Diagnosis not present

## 2019-06-11 DIAGNOSIS — I2699 Other pulmonary embolism without acute cor pulmonale: Secondary | ICD-10-CM | POA: Diagnosis not present

## 2019-06-11 DIAGNOSIS — I420 Dilated cardiomyopathy: Secondary | ICD-10-CM | POA: Diagnosis not present

## 2019-06-11 LAB — CBC WITH DIFFERENTIAL/PLATELET
Abs Immature Granulocytes: 0.03 10*3/uL (ref 0.00–0.07)
Basophils Absolute: 0 10*3/uL (ref 0.0–0.1)
Basophils Relative: 0 %
Eosinophils Absolute: 0.1 10*3/uL (ref 0.0–0.5)
Eosinophils Relative: 1 %
HCT: 40.4 % (ref 39.0–52.0)
Hemoglobin: 13.4 g/dL (ref 13.0–17.0)
Immature Granulocytes: 1 %
Lymphocytes Relative: 17 %
Lymphs Abs: 1.1 10*3/uL (ref 0.7–4.0)
MCH: 32.1 pg (ref 26.0–34.0)
MCHC: 33.2 g/dL (ref 30.0–36.0)
MCV: 96.7 fL (ref 80.0–100.0)
Monocytes Absolute: 0.5 10*3/uL (ref 0.1–1.0)
Monocytes Relative: 8 %
Neutro Abs: 4.6 10*3/uL (ref 1.7–7.7)
Neutrophils Relative %: 73 %
Platelets: 133 10*3/uL — ABNORMAL LOW (ref 150–400)
RBC: 4.18 MIL/uL — ABNORMAL LOW (ref 4.22–5.81)
RDW: 13.2 % (ref 11.5–15.5)
WBC: 6.4 10*3/uL (ref 4.0–10.5)
nRBC: 0 % (ref 0.0–0.2)

## 2019-06-11 LAB — BASIC METABOLIC PANEL
Anion gap: 8 (ref 5–15)
BUN: 11 mg/dL (ref 6–20)
CO2: 23 mmol/L (ref 22–32)
Calcium: 9 mg/dL (ref 8.9–10.3)
Chloride: 108 mmol/L (ref 98–111)
Creatinine, Ser: 0.82 mg/dL (ref 0.61–1.24)
GFR calc Af Amer: >60 mL/min (ref >60)
GFR calc non Af Amer: >60 mL/min (ref >60)
Glucose, Bld: 104 mg/dL — ABNORMAL HIGH (ref 70–99)
Potassium: 4 mmol/L (ref 3.5–5.1)
Sodium: 139 mmol/L (ref 135–145)

## 2019-06-11 LAB — MAGNESIUM: Magnesium: 2.2 mg/dL (ref 1.7–2.4)

## 2019-06-11 MED ORDER — LOSARTAN POTASSIUM 25 MG PO TABS: 25.0000 mg | ORAL_TABLET | Freq: Every day | ORAL | 1 refills | Status: DC

## 2019-06-11 MED ORDER — METOPROLOL TARTRATE 100 MG PO TABS: 100.0000 mg | ORAL_TABLET | Freq: Two times a day (BID) | ORAL | 1 refills | Status: DC

## 2019-06-11 MED ORDER — DIGOXIN 250 MCG PO TABS: 0.2500 mg | ORAL_TABLET | Freq: Every day | ORAL | 1 refills | Status: DC

## 2019-06-11 MED ORDER — APIXABAN 5 MG PO TABS: 10.0000 mg | ORAL_TABLET | Freq: Two times a day (BID) | ORAL | 1 refills | Status: DC

## 2019-06-11 MED ORDER — DILTIAZEM HCL ER 120 MG PO CP12: 240.0000 mg | ORAL_CAPSULE | Freq: Two times a day (BID) | ORAL | 1 refills | Status: DC

## 2019-06-11 NOTE — Plan of Care (Signed)
  Problem: Education: Goal: Knowledge of General Education information will improve Description: Including pain rating scale, medication(s)/side effects and non-pharmacologic comfort measures Outcome: Progressing   Problem: Health Behavior/Discharge Planning: Goal: Ability to manage health-related needs will improve Outcome: Progressing   Problem: Clinical Measurements: Goal: Ability to maintain clinical measurements within normal limits will improve Outcome: Not Progressing Note: Platelets remain low at only 133. Will continue to monitor lab values for the remainder of the shift. Wenda Low [facility]

## 2019-06-11 NOTE — TOC Transition Note (Addendum)
Transition of Care [facility]) - CM/SW Discharge Note   Patient Details  Name: Calvin Esparza MRN: [id number] Date of Birth: 05/03/1960  [facility]) CM/SW Contact:  Marshell Garfinkel, RN Phone Number: 06/11/2019, 1:21 PM   Clinical Narrative:     Eliquis voucher provided. Update: Patient states he has healthcare coverage under [employer].   Final next level of care: Home/Self Care     Patient Goals and CMS Choice   CMS Medicare.gov Compare Post Acute Care list provided to:: Patient    Discharge Placement                       Discharge Plan and Services   Discharge Planning Services: Medication Assistance, [facility]                                 Social Determinants of Health (SDOH) Interventions     Readmission Risk Interventions No flowsheet data found.

## 2019-06-11 NOTE — Discharge Instructions (Signed)
Sinus Tachycardia  Sinus tachycardia is a kind of fast heartbeat. In sinus tachycardia, the heart beats more than 100 times a minute. Sinus tachycardia starts in a part of the heart called the sinus node. Sinus tachycardia may be harmless, or it may be a sign of a serious condition. What are the causes? This condition may be caused by:  Exercise or exertion.  A fever.  Pain.  Loss of body fluids (dehydration).  Severe bleeding (hemorrhage).  Anxiety and stress.  Certain substances, including: ? Alcohol. ? Caffeine. ? Tobacco and nicotine products. ? Cold medicines. ? Illegal drugs.  Medical conditions including: ? Heart disease. ? An infection. ? An overactive thyroid (hyperthyroidism). ? A lack of red blood cells (anemia). What are the signs or symptoms? Symptoms of this condition include:  A feeling that the heart is beating quickly (palpitations).  Suddenly noticing your heartbeat (cardiac awareness).  Dizziness.  Tiredness (fatigue).  Shortness of breath.  Chest pain.  Nausea.  Fainting. How is this diagnosed? This condition is diagnosed with:  A physical exam.  Other tests, such as: ? Blood tests. ? An electrocardiogram (ECG). This test measures the electrical activity of the heart. ? Ambulatory cardiac monitor. This records your heartbeats for 24 hours or more. You may be referred to a heart specialist (cardiologist). How is this treated? Treatment for this condition depends on the cause or the underlying condition. Treatment may involve:  Treating the underlying condition.  Taking new medicines or changing your current medicines as told by your health care provider.  Making changes to your diet or lifestyle. Follow these instructions at home: Lifestyle   Do not use any products that contain nicotine or tobacco, such as cigarettes and e-cigarettes. If you need help quitting, ask your health care provider.  Do not use illegal drugs, such as  cocaine.  Learn relaxation methods to help you when you get stressed or anxious. These include deep breathing.  Avoid caffeine or other stimulants. Alcohol use   Do not drink alcohol if: ? Your health care provider tells you not to drink. ? You are pregnant, may be pregnant, or are planning to become pregnant.  If you drink alcohol, limit how much you have: ? 0-1 drink a day for women. ? 0-2 drinks a day for men.  Be aware of how much alcohol is in your drink. In the [place] one drink equals one typical bottle of beer (12 oz), one-half glass of wine (5 oz), or one shot of hard liquor (1 oz). General instructions  Drink enough fluids to keep your urine pale yellow.  Take over-the-counter and prescription medicines only as told by your health care provider.  Keep all follow-up visits as told by your health care provider. This is important. Contact a health care provider if you have:  A fever.  Vomiting or diarrhea that does not go away. Get help right away if you:  Have pain in your chest, upper arms, jaw, or neck.  Become weak or dizzy.  Feel faint.  Have palpitations that do not go away. Summary  In sinus tachycardia, the heart beats more than 100 times a minute.  Sinus tachycardia may be harmless, or it may be a sign of a serious condition.  Treatment for this condition depends on the cause or the underlying condition.  Get help right away if you have pain in your chest, upper arms, jaw, or neck. This information is not intended to replace advice given to you by   your health care provider. Make sure you discuss any questions you have with your health care provider. Document Revised: 04/22/2017 Document Reviewed: 04/22/2017 Elsevier Patient Education  2020 [employer]  

## 2019-06-11 NOTE — Consult Note (Signed)
ELECTROPHYSIOLOGY CONSULT NOTE    Primary Care Physician: [name], MD Referring Physician:  Dr Arbutus Ped  Admit Date: 06/07/2019  Reason for consultation:  afib  Calvin Esparza is a 59 y.o. male with a h/o ETOH abuse, chronic pain, and persistent afib who was admitted with symptomatic afib and CHF.   He was initially diagnosed with Afib 03/2018.  He did well with beta blocker therapy initially. He developed COVID 02/2019.  He was noted to be in sinus at that time.  He has had progressive symptoms of SOB since that time.  His symptoms became markedly worse 2 weeks ago.  He presented to [facility] and was found to have recurrence of afib with RVR.  He is also found to have EF 35-40% by echo.   He has been rate controlled and restarted on anticoagulation.  He remains in afib but is clinically improved with rate control and diuresis. He was noted to have concerns for PTE and has been started on high dose eliquis. Today, he denies symptoms of palpitations, chest pain, shortness of breath, orthopnea, PND, lower extremity edema, dizziness, presyncope, syncope, or neurologic sequela.  He states that he feels well and wants to go home.  The patient is tolerating medications without difficulties and is otherwise without complaint today.   Past Medical History:  Diagnosis Date  . Alcohol abuse   . Cardiomyopathy ([place])    a. 05/2019 Echo: EF 35-40%, gr1 DD. Mod enlarged RV. Mildly dil LA. Mild to mod dil RA. Triv AI.  Marland Kitchen Chronic pain   . COVID-19    a. 02/2019  . Persistent atrial fibrillation ([place])   . Pulmonary embolism ([place])    a. 05/2019 CTA Chest: mild amt of PE w/in a lower lobe branch of RPA.   History reviewed. No pertinent surgical history.  Marland Kitchen apixaban  10 mg Oral BID   Followed by  . [START ON 06/16/2019] apixaban  5 mg Oral BID  . digoxin  0.25 mg Oral Daily  . diltiazem  60 mg Oral Q6H  . losartan  25 mg Oral Daily  . mouth rinse  15 mL Mouth Rinse BID  . metoprolol tartrate   100 mg Oral BID     No Known Allergies  Social History   Socioeconomic History  . Marital status: Married    Spouse name: Not on file  . Number of children: Not on file  . Years of education: Not on file  . Highest education level: Not on file  Occupational History  . Not on file  Tobacco Use  . Smoking status: Never Smoker  . Smokeless tobacco: Current User    Types: Chew  Substance and Sexual Activity  . Alcohol use: Yes    Alcohol/week: 56.0 standard drinks    Types: 56 Cans of beer per week  . Drug use: Not Currently  . Sexual activity: Not on file  Other Topics Concern  . Not on file  Social History Narrative  . Not on file   Social Determinants of Health   Financial Resource Strain:   . Difficulty of Paying Living Expenses:   Food Insecurity:   . Worried About Charity fundraiser in the Last Year:   . Arboriculturist in the Last Year:   Transportation Needs:   . Film/video editor (Medical):   Marland Kitchen Lack of Transportation (Non-Medical):   Physical Activity:   . Days of Exercise per Week:   . Minutes of  Exercise per Session:   Stress:   . Feeling of Stress :   Social Connections:   . Frequency of Communication with Friends and Family:   . Frequency of Social Gatherings with Friends and Family:   . Attends Religious Services:   . Active Member of Clubs or Organizations:   . Attends Archivist Meetings:   Marland Kitchen Marital Status:   Intimate Partner Violence:   . Fear of Current or Ex-Partner:   . Emotionally Abused:   Marland Kitchen Physically Abused:   . Sexually Abused:     Family History  Problem Relation Age of Onset  . Bone cancer Father   . Stroke Father   . Diabetes Sister     ROS- All systems are reviewed and negative except as per the HPI above  Physical Exam: Telemetry: Vitals:   06/11/19 0419 06/11/19 0543 06/11/19 0544 06/11/19 0753  BP:  (!) 140/107 133/88 117/79  Pulse:  (!) 102 (!) 106 78  Resp:  18  19  Temp:  99.1 F (37.3 C)   98.4 F (36.9 C)  TempSrc:  Oral  Oral  SpO2:  93% 94% 93%  Weight: 85.5 kg     Height:        GEN- The patient is well appearing, alert and oriented x 3 today.   Head- normocephalic, atraumatic Eyes-  Sclera clear, conjunctiva pink Ears- hearing intact Oropharynx- clear Neck- supple, no JVP Lymph- no cervical lymphadenopathy Lungs- Clear to ausculation bilaterally, normal work of breathing Heart- irregular rate and rhythm, no murmurs, rubs or gallops, PMI not laterally displaced GI- soft, NT, ND, + BS Extremities- no clubbing, cyanosis, or edema MS- no significant deformity or atrophy Skin- no rash or lesion Psych- euthymic mood, full affect Neuro- strength and sensation are intact  EKG- 06/07/2019- afib with V rate 150 bpm, LVH, nonspecific ST/T changes  Labs:   Lab Results  Component Value Date   WBC 6.4 06/11/2019   HGB 13.4 06/11/2019   HCT 40.4 06/11/2019   MCV 96.7 06/11/2019   PLT 133 (L) 06/11/2019    Recent Labs  Lab 06/07/19 1636 06/08/19 0211 06/11/19 0517  NA 140   < > 139  K 3.6   < > 4.0  CL 103   < > 108  CO2 24   < > 23  BUN 15   < > 11  CREATININE 1.12   < > 0.82  CALCIUM 9.9   < > 9.0  PROT 8.2*  --   --   BILITOT 1.5*  --   --   ALKPHOS 59  --   --   ALT 24  --   --   AST 31  --   --   GLUCOSE 125*   < > 104*   < > = values in this interval not displayed.     Radiology:  CTA 06/07/2019- ? A mild amount of pulmonary embolism within a lower lobe branch of the R pulmonary artery  myoview 06/10/2019- EF 32%,  No ischemic changes  Echo: 06/08/19-  EF 35-40%, global HKI, mild LVH, moderate RV enlargement, mild LA enlargement, moderate RA enlargement  ASSESSMENT AND PLAN:   1. Persistent afib currently rate controlled Likely the cause of his cardiomyopathy I think he would do better in sinus rhythm long term chads2vasc score is 1.  He is on eliquis (higher dose) due to acute PTE also. I would advise close follow-up in the AF clinic.   After  3 weeks of anticoagulation, cardioversion should be considered.  If he has further afib, tikosyn or amiodarone (short term) should be considered. Per [place] [facility] AF trial, ablation may also be a good option for him to consider eventually.   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  Could also be due to ETOH. Currently rate controlled Will need cardioversion in 3 weeks  3. ETOH Avoidance is encouraged  4. PTE On eliquis  OK to discharge with close follow-up in the AF clinic and with Dr Rockey Situ.   Thompson Grayer, MD 06/11/2019  11:35 AM

## 2019-06-11 NOTE — Discharge Summary (Signed)
Physician Discharge Summary  Calvin Esparza [id number] DOB: 1960-06-07 DOA: 06/07/2019  PCP: [name], MD  Admit date: 06/07/2019 Discharge date: 06/11/2019  Admitted From: home Disposition:  home  Recommendations for Outpatient Follow-up:  1. Follow up with PCP in 1-2 weeks 2. Please obtain BMP/CBC in one week 3. Patient started on Eliquis for both acute PE and A-fib 4. Patient now on multiple heart rate controlling medications for A-fib with RVR, monitor HR. 5. Follow up as soon as possible with cardiology 6. Follow up at the [place]: No  Equipment/Devices: None   Discharge Condition: Stable  CODE STATUS: Full  Diet recommendation: Heart Healthy (low sodium)  Brief/Interim Summary:  [name] a59 y.o.Caucasian malewith a history of COVID-19 in December 2020, presented to [facility] on 3/23with worsening dyspnea over the last couple of days with associated palpitations. In the ED, hypertensive 155/105, tachycardic HR 128, tachypneic RR 23, no hypoxia. Labs showed K 3.4, normal troponin x2, normal CMP and CBC. CXR negative. CTA chest showed mild amount of pulmonary embolism within a lower lobe branch of the right pulmonary artery.EKG showed A. fib with RVR, rate 151. Patient was started on heparin drip, IV Cardizem for heart rate control. Admitted to hospitalist service.  Acute pulmonary embolism- seems unprovoked, however patient did have COVID-19 in December.Lower extremity duplex ultrasounds negative for DVTs bilaterally. Echocardiogram showed EF of 35 to 40%, LV global hypokinesis, grade 1 diastolic dysfunction, no right heart strain.No prior echo to compare EF.  Transitioned from heparin gtt to Eliquis.    A. fib with RVR-new onset,likely paroxysmal and provoked by acute PE. Required going back on Cardizem drip on 3/24, HR uncontrolled on initial dose of metoprolol.  Cardiology consulted.  Oral regimen titrated and HR  control achieved on increased metoprolol, addition of diltiazem and digoxin.  Anticoagulated on Eliquis as above.  Chronic mixed systolic/diastolic CHF- euvolemic, compensated.Echo on 3/24 showed EF 35 to 40% with LV global hypokinesis and grade 1 diastolic dysfunction.  Suspect reduced EF is due to A. fib RVR --continue beta blocker as above   Alcohol abuse --monitored CIWA protocol with as needed Ativan.  No withdrawal issues occurred.  Discharge Diagnoses: Principal Problem:   Atrial fibrillation with RVR ([place]) Active Problems:   Acute pulmonary embolism (HCC)   Alcohol abuse   Dilated cardiomyopathy [facility])    Discharge Instructions   Discharge Instructions    (HEART FAILURE PATIENTS) Call MD:  Anytime you have any of the following symptoms: 1) 3 pound weight gain in 24 hours or 5 pounds in 1 week 2) shortness of breath, with or without a dry hacking cough 3) swelling in the hands, feet or stomach 4) if you have to sleep on extra pillows at night in order to breathe.   Complete by: As directed    Amb referral to AFIB Clinic   Complete by: As directed    Call MD for:   Complete by: As directed    Persistent heart racing with shortness of breath   Call MD for:  extreme fatigue   Complete by: As directed    Call MD for:  persistant dizziness or light-headedness   Complete by: As directed    Diet - low sodium heart healthy   Complete by: As directed    Discharge instructions   Complete by: As directed    Take all medications as prescribed.   Follow up closely with cardiology and in the A-fib clinic. If you are  feeling short of breath due to heart racing and not slowing down, call cardiology clinic or come to ED.   Increase activity slowly   Complete by: As directed      Allergies as of 06/11/2019   No Known Allergies     Medication List    TAKE these medications   apixaban 5 MG Tabs tablet Commonly known as: ELIQUIS Take 2 tablets (10 mg total) by mouth 2  (two) times daily. Starting on 06/16/19, reduce to 5 mg by mouth twice daily.   digoxin 0.25 MG tablet Commonly known as: LANOXIN Take 1 tablet (0.25 mg total) by mouth daily. Start taking on: June 12, 2019   diltiazem 120 MG 12 hr capsule Commonly known as: CARDIZEM SR Take 2 capsules (240 mg total) by mouth 2 (two) times daily.   losartan 25 MG tablet Commonly known as: COZAAR Take 1 tablet (25 mg total) by mouth daily. Start taking on: June 12, 2019   metoprolol tartrate 100 MG tablet Commonly known as: LOPRESSOR Take 1 tablet (100 mg total) by mouth 2 (two) times daily.      Follow-up Information    Gollan, Kathlene November, MD. Schedule an appointment as soon as possible for a visit in 1 week(s).   Specialty: Cardiology Contact information: [place] [address] [phone]        [name], MD. Schedule an appointment as soon as possible for a visit in 1 week(s).   Specialty: Internal Medicine Contact information: [place] [address] [phone]          No Known Allergies  Consultations:  cardiology    Procedures/Studies: DG Chest 2 View  Result Date: 06/07/2019 CLINICAL DATA:  Shortness of breath. EXAM: CHEST - 2 VIEW COMPARISON:  March 14, 2019 FINDINGS: The heart size and mediastinal contours are within normal limits. Both lungs are clear. A chronic seventh right rib fracture is seen. Degenerative changes seen throughout the thoracic spine. IMPRESSION: No active cardiopulmonary disease. Electronically Signed   By: Virgina Norfolk M.D.   On: 06/07/2019 17:26   CT Angio Chest PE W and/or Wo Contrast  Result Date: 06/07/2019 CLINICAL DATA:  Shortness of breath. EXAM: CT ANGIOGRAPHY CHEST WITH CONTRAST TECHNIQUE: Multidetector CT imaging of the chest was performed using the standard protocol during bolus administration of intravenous contrast. Multiplanar CT image  reconstructions and MIPs were obtained to evaluate the vascular anatomy. CONTRAST:  79mL OMNIPAQUE IOHEXOL 350 MG/ML SOLN COMPARISON:  None. FINDINGS: Cardiovascular: Satisfactory opacification of the pulmonary arteries to the segmental level. A mild amount of intraluminal low attenuation is seen within a lower lobe branch of the right pulmonary artery (axial CT images 52 through 54, CT series number 4/coronal reformatted images 54 through 56, CT series number 7). Normal heart size. No pericardial effusion. Mediastinum/Nodes: No enlarged mediastinal, hilar, or axillary lymph nodes. Thyroid gland, trachea, and esophagus demonstrate no significant findings. Lungs/Pleura: Lungs are clear. No pleural effusion or pneumothorax. Upper Abdomen: No acute abnormality. Musculoskeletal: No chest wall abnormality. No acute or significant osseous findings. Review of the MIP images confirms the above findings. IMPRESSION: A mild amount of pulmonary embolism within a lower lobe branch of the right pulmonary artery. Electronically Signed   By: Virgina Norfolk M.D.   On: 06/07/2019 18:42   NM Myocar Multi W/Spect Tamela Oddi Motion / EF  Result Date: 06/10/2019 Pharmacological myocardial perfusion imaging study with no significant  ischemia Moderate global hypokinesis, EF estimated at 32% No EKG changes concerning for ischemia at peak stress or in recovery. Baseline EKG with atrial fibrillation, rate 100 bpm Low risk scan Signed, Esmond Plants, MD, Ph.D [facility]   US Venous Img Lower Bilateral (DVT)  Result Date: 06/07/2019 CLINICAL DATA:  Initial evaluation for pulmonary embolus. EXAM: BILATERAL LOWER EXTREMITY VENOUS DOPPLER ULTRASOUND TECHNIQUE: Gray-scale sonography with graded compression, as well as color Doppler and duplex ultrasound were performed to evaluate the lower extremity deep venous systems from the level of the common femoral vein and including the common femoral, femoral, profunda femoral, popliteal and calf  veins including the posterior tibial, peroneal and gastrocnemius veins when visible. The superficial great saphenous vein was also interrogated. Spectral Doppler was utilized to evaluate flow at rest and with distal augmentation maneuvers in the common femoral, femoral and popliteal veins. COMPARISON:  Prior CTA of the chest from earlier the same day. FINDINGS: RIGHT LOWER EXTREMITY Common Femoral Vein: No evidence of thrombus. Normal compressibility, respiratory phasicity and response to augmentation. Saphenofemoral Junction: No evidence of thrombus. Normal compressibility and flow on color Doppler imaging. Profunda Femoral Vein: No evidence of thrombus. Normal compressibility and flow on color Doppler imaging. Femoral Vein: No evidence of thrombus. Normal compressibility, respiratory phasicity and response to augmentation. Popliteal Vein: No evidence of thrombus. Normal compressibility, respiratory phasicity and response to augmentation. Calf Veins: No evidence of thrombus. Normal compressibility and flow on color Doppler imaging. Superficial Great Saphenous Vein: No evidence of thrombus. Normal compressibility. Venous Reflux:  None. Other Findings:  None. LEFT LOWER EXTREMITY Common Femoral Vein: No evidence of thrombus. Normal compressibility, respiratory phasicity and response to augmentation. Saphenofemoral Junction: No evidence of thrombus. Normal compressibility and flow on color Doppler imaging. Profunda Femoral Vein: No evidence of thrombus. Normal compressibility and flow on color Doppler imaging. Femoral Vein: No evidence of thrombus. Normal compressibility, respiratory phasicity and response to augmentation. Popliteal Vein: No evidence of thrombus. Normal compressibility, respiratory phasicity and response to augmentation. Calf Veins: No evidence of thrombus. Normal compressibility and flow on color Doppler imaging. Superficial Great Saphenous Vein: No evidence of thrombus. Normal compressibility.  Venous Reflux:  None. Other Findings: Multiple thrombosed superficial varicose veins seen within the medial left lower leg just below the knee. IMPRESSION: 1. No evidence of deep venous thrombosis in either lower extremity. 2. Multiple thrombosed superficial varicose veins at the medial aspect of the left lower leg. Electronically Signed   By: Jeannine Boga M.D.   On: 06/07/2019 22:58   ECHOCARDIOGRAM COMPLETE  Result Date: 06/08/2019    ECHOCARDIOGRAM REPORT   Patient Name:   TEVIS KIRIN Date of Exam: 06/08/2019 Medical Rec #:  [id number]        Height:       70.0 in Accession #:    [id number]       Weight:       191.3 lb Date of Birth:  Nov 29, 1960       BSA:          2.048 m Patient Age:    59 years         BP:           122/94 mmHg Patient Gender: M                HR:           101 bpm. Exam Location:  [facility] Procedure: 2D Echo, Color Doppler, Cardiac Doppler and Intracardiac  Opacification Agent Indications:     I26.99 Pulmonary Embolus  History:         Patient has no prior history of Echocardiogram examinations.                  Arrythmias:Atrial Fibrillation. Pt tested positive for covid-19                  on 03/14/2019.  Sonographer:     Charmayne Sheer RDCS (AE) Referring Phys:  [id number] Arvella Merles MANSY Diagnosing Phys: Kate Sable MD  Sonographer Comments: Suboptimal subcostal window. IMPRESSIONS  1. Left ventricular ejection fraction, by estimation, is 35 to 40%. The left ventricle has moderately decreased function. The left ventricle demonstrates global hypokinesis. There is mild left ventricular hypertrophy. Left ventricular diastolic parameters are consistent with Grade I diastolic dysfunction (impaired relaxation).  2. Right ventricular systolic function is mildly reduced. The right ventricular size is moderately enlarged.  3. Left atrial size was mildly dilated.  4. Right atrial size was mild to moderately dilated.  5. The mitral valve is grossly normal. No evidence of mitral  valve regurgitation.  6. The aortic valve is tricuspid. Aortic valve regurgitation is trivial.  7. The inferior vena cava is normal in size with greater than 50% respiratory variability, suggesting right atrial pressure of 3 mmHg. FINDINGS  Left Ventricle: Left ventricular ejection fraction, by estimation, is 35 to 40%. The left ventricle has moderately decreased function. The left ventricle demonstrates global hypokinesis. Definity contrast agent was given IV to delineate the left ventricular endocardial borders. The left ventricular internal cavity size was normal in size. There is mild left ventricular hypertrophy. Left ventricular diastolic parameters are consistent with Grade I diastolic dysfunction (impaired relaxation). Right Ventricle: The right ventricular size is moderately enlarged. Right vetricular wall thickness was not assessed. Right ventricular systolic function is mildly reduced. Left Atrium: Left atrial size was mildly dilated. Right Atrium: Right atrial size was mild to moderately dilated. Pericardium: There is no evidence of pericardial effusion. Mitral Valve: The mitral valve is grossly normal. No evidence of mitral valve regurgitation. MV peak gradient, 2.7 mmHg. The mean mitral valve gradient is 2.0 mmHg. Tricuspid Valve: The tricuspid valve is not well visualized. Tricuspid valve regurgitation is not demonstrated. Aortic Valve: The aortic valve is tricuspid. Aortic valve regurgitation is trivial. Aortic regurgitation PHT measures 498 msec. Aortic valve mean gradient measures 3.0 mmHg. Aortic valve peak gradient measures 5.0 mmHg. Aortic valve area, by VTI measures  3.31 cm. Pulmonic Valve: The pulmonic valve was not well visualized. Pulmonic valve regurgitation is not visualized. Aorta: The aortic root is normal in size and structure. Venous: The inferior vena cava is normal in size with greater than 50% respiratory variability, suggesting right atrial pressure of 3 mmHg. IAS/Shunts: No  atrial level shunt detected by color flow Doppler.  LEFT VENTRICLE PLAX 2D LVIDd:         5.15 cm     Diastology LVIDs:         4.22 cm     LV e' lateral:   10.20 cm/s LV PW:         1.35 cm     LV E/e' lateral: 5.3 LV IVS:        1.13 cm     LV e' medial:    5.00 cm/s LVOT diam:     2.50 cm     LV E/e' medial:  10.8 LV SV:  58 LV SV Index:   28 LVOT Area:     4.91 cm  LV Volumes (MOD) LV vol d, MOD A2C: 84.8 ml LV vol d, MOD A4C: 86.1 ml LV vol s, MOD A2C: 41.2 ml LV vol s, MOD A4C: 54.7 ml LV SV MOD A2C:     43.6 ml LV SV MOD A4C:     86.1 ml LV SV MOD BP:      40.6 ml RIGHT VENTRICLE RV Basal diam:  5.29 cm LEFT ATRIUM           Index       RIGHT ATRIUM           Index LA diam:      4.30 cm 2.10 cm/m  RA Area:     24.30 cm LA Vol (A4C): 74.6 ml 36.42 ml/m RA Volume:   84.50 ml  41.25 ml/m  AORTIC VALVE                   PULMONIC VALVE AV Area (Vmax):    3.43 cm    PV Vmax:       0.67 m/s AV Area (Vmean):   3.55 cm    PV Vmean:      46.600 cm/s AV Area (VTI):     3.31 cm    PV VTI:        0.084 m AV Vmax:           112.00 cm/s PV Peak grad:  1.8 mmHg AV Vmean:          74.100 cm/s PV Mean grad:  1.0 mmHg AV VTI:            0.175 m AV Peak Grad:      5.0 mmHg AV Mean Grad:      3.0 mmHg LVOT Vmax:         78.30 cm/s LVOT Vmean:        53.600 cm/s LVOT VTI:          0.118 m LVOT/AV VTI ratio: 0.67 AI PHT:            498 msec  AORTA Ao Root diam: 3.60 cm MITRAL VALVE MV Area (PHT): 4.31 cm    SHUNTS MV Peak grad:  2.7 mmHg    Systemic VTI:  0.12 m MV Mean grad:  2.0 mmHg    Systemic Diam: 2.50 cm MV Vmax:       0.82 m/s MV Vmean:      57.0 cm/s MV Decel Time: 176 msec MV E velocity: 54.10 cm/s MV A velocity: 38.30 cm/s MV E/A ratio:  1.41 Kate Sable MD Electronically signed by Kate Sable MD Signature Date/Time: 06/08/2019/3:09:42 PM    Final        Subjective: Patient seen this AM.  No acute events.  He has no complaints, denies SOB, CP, palpitations.   Discharge Exam: Vitals:    06/11/19 0753 06/11/19 1136  BP: 117/79 (!) 119/96  Pulse: 78 91  Resp: 19 20  Temp: 98.4 F (36.9 C) 97.6 F (36.4 C)  SpO2: 93% 97%   Vitals:   06/11/19 0543 06/11/19 0544 06/11/19 0753 06/11/19 1136  BP: (!) 140/107 133/88 117/79 (!) 119/96  Pulse: (!) 102 (!) 106 78 91  Resp: 18  19 20   Temp: 99.1 F (37.3 C)  98.4 F (36.9 C) 97.6 F (36.4 C)  TempSrc: Oral  Oral Oral  SpO2: 93% 94% 93% 97%  Weight:  Height:        General: Pt is alert, awake, not in acute distress Cardiovascular: RRR, S1/S2 +, no rubs, no gallops Respiratory: diminished bilaterally, no wheezing, no rhonchi Abdominal: Soft, NT, ND, bowel sounds + Extremities: no edema, no cyanosis    The results of significant diagnostics from this hospitalization (including imaging, microbiology, ancillary and laboratory) are listed below for reference.     Microbiology: No results found for this or any previous visit (from the past 240 hour(s)).   Labs: BNP (last 3 results) Recent Labs    03/14/19 1232  BNP [id number]*   Basic Metabolic Panel: Recent Labs  Lab 06/07/19 1636 06/07/19 1647 06/08/19 0211 06/09/19 0547 06/10/19 0512 06/11/19 0517  NA 140  --  140 141 140 139  K 3.6  --  3.2* 3.1* 4.3 4.0  CL 103  --  104 116* 110 108  CO2 24  --  26 21* 22 23  GLUCOSE 125*  --  115* 94 112* 104*  BUN 15  --  16 9 14 11   CREATININE 1.12  --  0.98 0.62 0.88 0.82  CALCIUM 9.9  --  8.7* 7.1* 9.1 9.0  MG  --  2.0  --  1.7 2.2 2.2   Liver Function Tests: Recent Labs  Lab 06/07/19 1636  AST 31  ALT 24  ALKPHOS 59  BILITOT 1.5*  PROT 8.2*  ALBUMIN 4.6   No results for input(s): LIPASE, AMYLASE in the last 168 hours. No results for input(s): AMMONIA in the last 168 hours. CBC: Recent Labs  Lab 06/07/19 1636 06/08/19 0211 06/09/19 0547 06/10/19 0512 06/11/19 0517  WBC 6.3 5.4 4.2 7.9 6.4  NEUTROABS  --  3.0 2.4 5.9 4.6  HGB 15.7 13.7 11.2* 13.5 13.4  HCT 47.7 41.0 35.3* 40.8 40.4   MCV 95.6 94.9 98.6 96.7 96.7  PLT 197 167 126* 147* 133*   Cardiac Enzymes: No results for input(s): CKTOTAL, CKMB, CKMBINDEX, TROPONINI in the last 168 hours. BNP: Invalid input(s): POCBNP CBG: No results for input(s): GLUCAP in the last 168 hours. D-Dimer No results for input(s): DDIMER in the last 72 hours. Hgb A1c No results for input(s): HGBA1C in the last 72 hours. Lipid Profile No results for input(s): CHOL, HDL, LDLCALC, TRIG, CHOLHDL, LDLDIRECT in the last 72 hours. Thyroid function studies No results for input(s): TSH, T4TOTAL, T3FREE, THYROIDAB in the last 72 hours.  Invalid input(s): FREET3 Anemia work up No results for input(s): VITAMINB12, FOLATE, FERRITIN, TIBC, IRON, RETICCTPCT in the last 72 hours. Urinalysis No results found for: COLORURINE, APPEARANCEUR, LABSPEC, [place], GLUCOSEU, HGBUR, BILIRUBINUR, KETONESUR, PROTEINUR, UROBILINOGEN, NITRITE, LEUKOCYTESUR Sepsis Labs Invalid input(s): PROCALCITONIN,  WBC,  LACTICIDVEN Microbiology No results found for this or any previous visit (from the past 240 hour(s)).   Time coordinating discharge: Over 30 minutes  SIGNED:   Ezekiel Slocumb, DO [facility] 06/11/2019, 1:12 PM   If 7PM-7AM, please contact night-coverage [email or web address]

## 2019-06-13 ENCOUNTER — Telehealth (HOSPITAL_COMMUNITY): Payer: Self-pay | Admitting: Nurse Practitioner

## 2019-06-13 NOTE — Telephone Encounter (Signed)
Called and left message for patient to call Clinic.  Per staff message from Dr. Rayann Heman pt needs hosp f/u this wk with AFib Clinic.

## 2019-06-13 NOTE — Telephone Encounter (Signed)
Patient family member returned my call and is agreeable to appt 06/17/19 @9 :30 am with Roderic Palau, NP>

## 2019-06-17 ENCOUNTER — Other Ambulatory Visit: Payer: Self-pay

## 2019-06-17 ENCOUNTER — Encounter (HOSPITAL_COMMUNITY): Payer: Self-pay | Admitting: Nurse Practitioner

## 2019-06-17 ENCOUNTER — Telehealth (HOSPITAL_COMMUNITY): Payer: Self-pay

## 2019-06-17 ENCOUNTER — Ambulatory Visit (HOSPITAL_COMMUNITY)
Admission: RE | Admit: 2019-06-17 | Discharge: 2019-06-17 | Disposition: A | Discharge status: Home / Self Care | Payer: 59 | Source: Ambulatory Visit | Attending: Nurse Practitioner | Admitting: Nurse Practitioner

## 2019-06-17 VITALS — BP 128/80 | HR 79 | Ht 70.0 in | Wt 196.2 lb

## 2019-06-17 DIAGNOSIS — I429 Cardiomyopathy, unspecified: Secondary | ICD-10-CM | POA: Insufficient documentation

## 2019-06-17 DIAGNOSIS — Z7901 Long term (current) use of anticoagulants: Secondary | ICD-10-CM | POA: Diagnosis not present

## 2019-06-17 DIAGNOSIS — Z79899 Other long term (current) drug therapy: Secondary | ICD-10-CM | POA: Diagnosis not present

## 2019-06-17 DIAGNOSIS — Z8616 Personal history of COVID-19: Secondary | ICD-10-CM | POA: Diagnosis not present

## 2019-06-17 DIAGNOSIS — Z86711 Personal history of pulmonary embolism: Secondary | ICD-10-CM | POA: Insufficient documentation

## 2019-06-17 DIAGNOSIS — D6869 Other thrombophilia: Secondary | ICD-10-CM | POA: Diagnosis not present

## 2019-06-17 DIAGNOSIS — I4819 Other persistent atrial fibrillation: Secondary | ICD-10-CM

## 2019-06-17 MED ORDER — APIXABAN 5 MG PO TABS: ORAL_TABLET | ORAL | Status: DC

## 2019-06-17 NOTE — Progress Notes (Signed)
Primary Care Physician: [name], MD Referring Physician: Dr. Rayann Heman Cardiologist: Dr. Gar Ponto Calvin Esparza is a 59 y.o. male with a h/o COVID-19 in December 2020, presented to theED on 3/23with worsening dyspnea over the last couple of days with associated palpitations. In the ED, hypertensive 155/105, tachycardic HR 128, tachypneic RR 23, no hypoxia. Labs showed K 3.4, normal troponin x2, normal CMP and CBC. CXR negative. CTA chest showed mild amount of pulmonary embolism within a lower lobe branch of the right pulmonary artery.EKG showed A. fib with RVR, rate 151. Patient was started on heparin drip, IV Cardizem for heart rate control. Admitted to hospitalist service.Echo showed EF of 35-40%. He was started on high dose of eliquis for PE.  Dr. Rayann Heman saw in consult. He remained in afib but with rate control. He felt improved with rate control and diuresis.He felt RVR was likely the cause of his cardiomyopathy. He suggested after 3 weeks of uninterrupted anticoagulation, cardioversion should be considered. Ablation may also be an option for him to consider eventually.    Today, ekg shows afib at 79 bpm. He feels improved but still has fatigue and exertional dyspnea. No missed doses of eliquis with a CHA2DS2VASc of 2.  Today, he denies symptoms of palpitations, chest pain, shortness of breath, orthopnea, PND, lower extremity edema, dizziness, presyncope, syncope, or neurologic sequela. The patient is tolerating medications without difficulties and is otherwise without complaint today.   Past Medical History:  Diagnosis Date  . Alcohol abuse   . Cardiomyopathy ([place])    a. 05/2019 Echo: EF 35-40%, gr1 DD. Mod enlarged RV. Mildly dil LA. Mild to mod dil RA. Triv AI.  Marland Kitchen Chronic pain   . COVID-19    a. 02/2019  . Persistent atrial fibrillation ([place])   . Pulmonary embolism ([place])    a. 05/2019 CTA Chest: mild amt of PE w/in a lower lobe branch of RPA.   No past  surgical history on file.  Current Outpatient Medications  Medication Sig Dispense Refill  . apixaban (ELIQUIS) 5 MG TABS tablet Taking one tablet by mouth twice daily    . digoxin (LANOXIN) 0.25 MG tablet Take 1 tablet (0.25 mg total) by mouth daily. 30 tablet 1  . diltiazem (CARDIZEM SR) 120 MG 12 hr capsule Take 2 capsules (240 mg total) by mouth 2 (two) times daily. 60 capsule 1  . losartan (COZAAR) 25 MG tablet Take 1 tablet (25 mg total) by mouth daily. 30 tablet 1  . metoprolol tartrate (LOPRESSOR) 100 MG tablet Take 1 tablet (100 mg total) by mouth 2 (two) times daily. 60 tablet 1   No current facility-administered medications for this encounter.    No Known Allergies  Social History   Socioeconomic History  . Marital status: Married    Spouse name: Not on file  . Number of children: Not on file  . Years of education: Not on file  . Highest education level: Not on file  Occupational History  . Not on file  Tobacco Use  . Smoking status: Never Smoker  . Smokeless tobacco: Current User    Types: Chew  Substance and Sexual Activity  . Alcohol use: Yes    Alcohol/week: 56.0 standard drinks    Types: 56 Cans of beer per week    Comment: 6 beers daily, weekend drinks more-  . Drug use: Not Currently  . Sexual activity: Not on file  Other Topics Concern  . Not on file  Social History Narrative  . Not on file   Social Determinants of Health   Financial Resource Strain:   . Difficulty of Paying Living Expenses:   Food Insecurity:   . Worried About Charity fundraiser in the Last Year:   . Arboriculturist in the Last Year:   Transportation Needs:   . Film/video editor (Medical):   Marland Kitchen Lack of Transportation (Non-Medical):   Physical Activity:   . Days of Exercise per Week:   . Minutes of Exercise per Session:   Stress:   . Feeling of Stress :   Social Connections:   . Frequency of Communication with Friends and Family:   . Frequency of Social Gatherings with  Friends and Family:   . Attends Religious Services:   . Active Member of Clubs or Organizations:   . Attends Archivist Meetings:   Marland Kitchen Marital Status:   Intimate Partner Violence:   . Fear of Current or Ex-Partner:   . Emotionally Abused:   Marland Kitchen Physically Abused:   . Sexually Abused:     Family History  Problem Relation Age of Onset  . Bone cancer Father   . Stroke Father   . Diabetes Sister     ROS- All systems are reviewed and negative except as per the HPI above  Physical Exam: Vitals:   06/17/19 0952  BP: 128/80  Pulse: 79  Weight: 89 kg  Height: 5\' 10"  (1.778 m)   Wt Readings from Last 3 Encounters:  06/17/19 89 kg  06/11/19 85.5 kg  03/14/19 83.9 kg    Labs: Lab Results  Component Value Date   NA 139 06/11/2019   K 4.0 06/11/2019   CL 108 06/11/2019   CO2 23 06/11/2019   GLUCOSE 104 (H) 06/11/2019   BUN 11 06/11/2019   CREATININE 0.82 06/11/2019   CALCIUM 9.0 06/11/2019   MG 2.2 06/11/2019   Lab Results  Component Value Date   INR 1.0 06/07/2019   No results found for: CHOL, HDL, LDLCALC, TRIG   GEN- The patient is well appearing, alert and oriented x 3 today.   Head- normocephalic, atraumatic Eyes-  Sclera clear, conjunctiva pink Ears- hearing intact Oropharynx- clear Neck- supple, no JVP Lymph- no cervical lymphadenopathy Lungs- Clear to ausculation bilaterally, normal work of breathing Heart-irregular rate and rhythm, no murmurs, rubs or gallops, PMI not laterally displaced GI- soft, NT, ND, + BS Extremities- no clubbing, cyanosis, or edema MS- no significant deformity or atrophy Skin- no rash or lesion Psych- euthymic mood, full affect Neuro- strength and sensation are intact  EKG-afib at 79 bpm, qrs int 90 ms, qtc 410 ms Echo-IMPRESSIONS    1. Left ventricular ejection fraction, by estimation, is 35 to 40%. The  left ventricle has moderately decreased function. The left ventricle  demonstrates global hypokinesis. There is  mild left ventricular  hypertrophy. Left ventricular diastolic  parameters are consistent with Grade I diastolic dysfunction (impaired  relaxation).  2. Right ventricular systolic function is mildly reduced. The right  ventricular size is moderately enlarged.  3. Left atrial size was mildly dilated.  4. Right atrial size was mild to moderately dilated.  5. The mitral valve is grossly normal. No evidence of mitral valve  regurgitation.  6. The aortic valve is tricuspid. Aortic valve regurgitation is trivial.  7. The inferior vena cava is normal in size with greater than 50%  respiratory variability, suggesting right atrial pressure of 3 mmHg.  Assessment and Plan: 1. Persistent  symptomatic afib  Pt is currently rate controlled  Per Dr. Jackalyn Lombard note, he will be eligible for a cardioversion if no missed anticoagulation 3 weeks from 3/27, after 4/17. He has appointment with Dr. Rockey Situ, 4/6, his regular cardiologist, and he lives in [place]. For his convenience he would be interested is having  this done with Dr. Rockey Situ in [place].  IF he does not maintain SR, he can be considered for amiodarone or tiksoyn.  2. CHA2DS2VASc score of at least 3 He had been on 10 mg eliquis but as of yesterday he was to reduce to 5 mg bid He has continued to take 10 mg bid but as of tonight will lower the dose to 5 mg bid  He was reminded not to miss any doses with cardioversion being imminent  I will be glad to see back if  unable to restore or maintain SR/ antiarrythmic's are needed   Butch Penny C. Carroll, [facility] [address] [place], [place] [address] [phone]

## 2019-06-17 NOTE — Telephone Encounter (Addendum)
Patient contacted regarding his Eliquis. He was told to only take one tablet of the Eliquis 5mg  tonight. Patient states he has been taking the Eliquis 5mg  two tablets twice daily. I advised patient to decrease the dose to take it twice daily. Tomorrow he will start taking the Eliquis 5mg - twice daily. Consulted with patient and he verbalized understanding.

## 2019-06-20 NOTE — Progress Notes (Signed)
No current cardiology Office Note  Date:  06/21/2019   ID:  Calvin Esparza, DOB [birth year], MRN [id number]  PCP:  [name], MD   Chief Complaint  Patient presents with  . office visit    Hospital F/U-A Fib/PE-Patient reports nausea, itching of his skin, and fatigue; Meds verbally reviewed with patient.    HPI:  Calvin Esparza is a 59 year old gentleman with past medical history of Alcohol abuse Chronic pain, knee pain Recently seen in the emergency room August 28, 2018 for chest pain, dizzy, atrial fibrillation Heart rate from 110 up to 130 bpm Who presents for follow-up of his paroxysmal atrial fibrillation  Seen in the emergency room June 2020 diagnosis of atrial fibrillation  on anticoagulation, Xarelto, metoprolol 100 daily  Last seen in clinic last seen in clinic July 2020 Covid infection December 2020  Recent admission to the hospital March 2021 for Worsening shortness of breath March 2021 CTA showing PE lower lobe branch on the right EKG with atrial fibrillation rate 151 bpm Negative for DVT Anticoagulated with Eliquis, rate control metoprolol diltiazem digoxin  Echocardiogram ejection fraction 35 to 40%  Recently seen in the atrial fibrillation clinic, Recommendation was to proceed with cardioversion after April 17/21  In follow-up from the hospital today reports that he feels relatively well but very tired, feels overmedicated, feels he has too many pills Pours concrete, feels tired in the mornings especially after all his pills has to go back to bed  Does not have a blood pressure cuff at home  EKG personally reviewed by myself on todays visit Shows atrial fibrillation ventricular rate 66 bpm   PMH:   has a past medical history of Alcohol abuse, Cardiomyopathy ([place]), Chronic pain, COVID-19, Persistent atrial fibrillation ([place]), and Pulmonary embolism ([place]).  PSH:   History reviewed. No pertinent surgical history.  Current Outpatient  Medications  Medication Sig Dispense Refill  . apixaban (ELIQUIS) 5 MG TABS tablet Taking one tablet by mouth twice daily    . digoxin (LANOXIN) 0.25 MG tablet Take 1 tablet (0.25 mg total) by mouth daily. 30 tablet 1  . diltiazem (CARDIZEM SR) 120 MG 12 hr capsule Take 2 capsules (240 mg total) by mouth 2 (two) times daily. 60 capsule 1  . losartan (COZAAR) 25 MG tablet Take 1 tablet (25 mg total) by mouth daily. 30 tablet 1  . metoprolol tartrate (LOPRESSOR) 100 MG tablet Take 1 tablet (100 mg total) by mouth 2 (two) times daily. 60 tablet 1   No current facility-administered medications for this visit.    Allergies:   Patient has no known allergies.   Social History:  The patient  reports that he has never smoked. His smokeless tobacco use includes chew. He reports current alcohol use of about 56.0 standard drinks of alcohol per week. He reports previous drug use.   Family History:   family history includes Bone cancer in his father; Diabetes in his sister; Stroke in his father.    Review of Systems: Review of Systems  Constitutional: Negative.   HENT: Negative.   Respiratory: Negative.   Cardiovascular: Negative.   Gastrointestinal: Negative.   Musculoskeletal: Negative.   Neurological: Negative.   Psychiatric/Behavioral: Negative.   All other systems reviewed and are negative.   PHYSICAL EXAM: VS:  BP 130/76 (BP Location: Left Arm, Patient Position: Sitting, Cuff Size: Normal)   Pulse 66   Ht 5\' 10"  (1.778 m)   Wt 196 lb 8 oz (89.1 kg)  SpO2 95%   BMI 28.19 kg/m  , BMI Body mass index is 28.19 kg/m. Constitutional:  oriented to person, place, and time. No distress.  HENT:  Head: Grossly normal Eyes:  no discharge. No scleral icterus.  Neck: No JVD, no carotid bruits  Cardiovascular: Irregularly irregular no murmurs appreciated Pulmonary/Chest: Clear to auscultation bilaterally, no wheezes or rails Abdominal: Soft.  no distension.  no tenderness.  Musculoskeletal:  Normal range of motion Neurological:  normal muscle tone. Coordination normal. No atrophy Skin: Skin warm and dry Psychiatric: normal affect, pleasant   Recent Labs: 03/14/2019: B Natriuretic Peptide 316.0 06/07/2019: ALT 24; TSH 1.823 06/11/2019: BUN 11; Creatinine, Ser 0.82; Hemoglobin 13.4; Magnesium 2.2; Platelets 133; Potassium 4.0; Sodium 139    Lipid Panel No results found for: CHOL, HDL, LDLCALC, TRIG    Wt Readings from Last 3 Encounters:  06/21/19 196 lb 8 oz (89.1 kg)  06/17/19 196 lb 3.2 oz (89 kg)  06/11/19 188 lb 9.6 oz (85.5 kg)       ASSESSMENT AND PLAN:  Problem List Items Addressed This Visit      Cardiology Problems   Acute pulmonary embolism (HCC)   Chronic combined systolic and diastolic CHF (congestive heart failure) (HCC)     Other   Alcohol abuse    Other Visit Diagnoses    Persistent atrial fibrillation ([place])    -  Primary     Atrial fibrillation, persistent Side effects from his medications, frequency of medications, number of medications, feels overmedicated, has to go back to bed in the morning after taking his a.m. meds -Recommend we make some changes for ease of use, he prefers to take all of them at once in the evening We will try to accommodate this request -Recommend he change his metoprolol to tartrate to metoprolol succinate 150 mg in the evening -Change the diltiazem 240 twice daily to 360 once a day in the evening -Change the Eliquis 5 twice daily to Xarelto 20 daily -Stay on losartan in the evening -He does have some mild leg swelling, unclear if this is secondary to calcium channel blocker versus his very high fluid intake Recommend he moderate his fluid intake, will give him Lasix to take as needed.  Suggested he take this with over-the-counter potassium or banana --- Plan for EKG in 2 weeks time.  If still atrial fibrillation we will arrange a cardioversion.  Follow-up in the clinic after that -Suggested he buy blood pressure  cuff so he can monitor blood pressure and heart rate before and after cardioversion  Alcohol cessation recommended  Pulmonary embolism On Xarelto  Cardiomyopathy Stressed the importance of taking his medications, avoiding alcohol, Suspect low ejection fraction from  tachycardia mediated Will aim to restore normal sinus rhythm   Disposition:   F/U  6 weeks   Total encounter time more than 45 minutes  Greater than 50% was spent in counseling and coordination of care with the patient    Signed, Esmond Plants, M.D., Ph.D. [place], [place]

## 2019-06-21 ENCOUNTER — Encounter: Payer: Self-pay | Admitting: Cardiovascular Disease

## 2019-06-21 ENCOUNTER — Ambulatory Visit (INDEPENDENT_AMBULATORY_CARE_PROVIDER_SITE_OTHER): Payer: 59 | Admitting: Cardiovascular Disease

## 2019-06-21 ENCOUNTER — Other Ambulatory Visit: Payer: Self-pay

## 2019-06-21 VITALS — BP 130/76 | HR 66 | Ht 70.0 in | Wt 196.5 lb

## 2019-06-21 DIAGNOSIS — I5042 Chronic combined systolic (congestive) and diastolic (congestive) heart failure: Secondary | ICD-10-CM

## 2019-06-21 DIAGNOSIS — F1010 Alcohol abuse, uncomplicated: Secondary | ICD-10-CM

## 2019-06-21 DIAGNOSIS — I4819 Other persistent atrial fibrillation: Secondary | ICD-10-CM | POA: Diagnosis not present

## 2019-06-21 DIAGNOSIS — I2699 Other pulmonary embolism without acute cor pulmonale: Secondary | ICD-10-CM

## 2019-06-21 MED ORDER — RIVAROXABAN 20 MG PO TABS: 20.0000 mg | ORAL_TABLET | Freq: Every day | ORAL | 11 refills | Status: DC

## 2019-06-21 MED ORDER — DILTIAZEM HCL ER COATED BEADS 360 MG PO CP24: 360.0000 mg | ORAL_CAPSULE | Freq: Every day | ORAL | 3 refills | Status: DC

## 2019-06-21 MED ORDER — FUROSEMIDE 20 MG PO TABS: 20.0000 mg | ORAL_TABLET | Freq: Every day | ORAL | 3 refills | Status: DC | PRN

## 2019-06-21 MED ORDER — METOPROLOL SUCCINATE ER 50 MG PO TB24: 50.0000 mg | ORAL_TABLET | Freq: Every day | ORAL | 3 refills | Status: DC

## 2019-06-21 MED ORDER — METOPROLOL SUCCINATE ER 100 MG PO TB24: 100.0000 mg | ORAL_TABLET | Freq: Every day | ORAL | 3 refills | Status: DC

## 2019-06-21 NOTE — Patient Instructions (Addendum)
Medication Instructions:  Your physician has recommended you make the following change in your medication:  1. STOP Eliquis 2. STOP Metoprolol tartrate 3. STOP Diltiazem 240 mh 4. START Xarelto 20 mg once daily 5. START Metoprolol succinate 100 mg and 50 mg (Total 150 mg) once daily  6. START Diltiazem 360 mg once daily 7. STAY on Digoxin once daily 8. TAKE Furosemide (Lasix) 20 mg once daily as needed for leg swelling. Make sure to take with banana/OTC potassium pill.    If you need a refill on your cardiac medications before your next appointment, please call your pharmacy.    Lab work: No new labs needed   If you have labs (blood work) drawn today and your tests are completely normal, you will receive your results only by: Marland Kitchen MyChart Message (if you have MyChart) OR . A paper copy in the mail If you have any lab test that is abnormal or we need to change your treatment, we will call you to review the results.   Testing/Procedures: No new testing needed   Follow-Up: At [facility], you and your health needs are our priority.  As part of our continuing mission to provide you with exceptional heart care, we have created designated Provider Care Teams.  These Care Teams include your primary Cardiologist (physician) and Advanced Practice Providers (APPs -  Physician Assistants and Nurse Practitioners) who all work together to provide you with the care you need, when you need it.  . You will need a follow up appointment in 5 weeks . EKG in 2 weeks at [facility] for potential precardioversion .  Marland Kitchen Providers on your designated Care Team:   . Murray Hodgkins, NP . Christell Faith, PA-C . Marrianne Mood, PA-C  Any Other Special Instructions Will Be Listed Below (If Applicable).  For educational health videos Log in to : [email or web address] Or : [email or web address], password : [email or web address]

## 2019-06-22 ENCOUNTER — Telehealth: Payer: Self-pay | Admitting: Cardiovascular Disease

## 2019-06-22 NOTE — Telephone Encounter (Signed)
Pt c/o medication issue:  1. Name of Medication: All   2. How are you currently taking this medication (dosage and times per day)?  Please advise   3. Are you having a reaction (difficulty breathing--STAT)?  No   4. What is your medication issue? Patient wife concerned that medication is not being taken correctly .  The metoprolol recently changed and also xarelto has not been filled yet due to cost patient is currently taking eliquis.  Patient wife wants to know if there is an alternative that is more reasonable.   Please call

## 2019-06-22 NOTE — Telephone Encounter (Signed)
They were here yesterday. We reviewed all medications with them in detail and provided Xarelto information for free 30 days etc. So just FYI I will call and see if I can answer her questions again.

## 2019-06-23 NOTE — Telephone Encounter (Signed)
Spoke with patients daughter per release form. She was not at home currently and does not have the names of the medication. She states the pharmacy has something different on one of the bottles. Advised that I would call them and then call her back. Called pharmacy and spoke with Ilona Sorrel and he went through patients medications to confirm all dosages and instructions. They did match our list and then called daughter back to confirm all was good. Recommended that if bottles have something different to go to [facility] and ask to speak with Ilona Sorrel with bottle in question. She verbalized understanding with no further questions at this time.

## 2019-07-05 ENCOUNTER — Ambulatory Visit: Payer: 59 | Attending: Internal Medicine

## 2019-07-24 NOTE — Progress Notes (Signed)
No current cardiology Office Note  Date:  07/25/2019   ID:  Calvin Esparza, DOB 01-08-1961, MRN [id number]  PCP:  [name], MD   Chief Complaint  Patient presents with  . other    5 week follow up. Meds reviewed by the pt. verbally. Pt. c/o no energy and some shortness of breath.     HPI:  [name] is a 59 year old gentleman with past medical history of Alcohol abuse Chronic pain, knee pain Recently seen in the emergency room August 28, 2018 for chest pain, dizzy, atrial fibrillation Heart rate from 110 up to 130 bpm CTA showing PE lower lobe branch on the right March 2021 Atrial fibrillation March 2021 Who presents for follow-up of his paroxysmal atrial fibrillation  Last seen by myself June 21, 2019 Felt overwhelmed by the medications he was on Was changed to metoprolol succinate 150 mg in the evening  diltiazem 360 once a day in the evening Xarelto 20 daily  losartan in the evening He had mild leg swelling Plan is for repeat EKG possible cardioversion in follow-up  Reports he does not feel right in atrial fibrillation Medications have done better, speech control better blood pressure stable He has been checking his numbers at home Sometimes heart rates into the high 50s at rest  No leg edema, no shortness of breath on exertion, able to keep working Works in IT consultant in cardioversion  EKG personally reviewed by myself on todays visit Shows a true fibrillation rate 62 bpm no significant ST-T wave changes  Other past medical history reviewed Seen in the emergency room June 2020 diagnosis of atrial fibrillation  on anticoagulation, Xarelto, metoprolol 100 daily  Covid infection December 2020  admission to the hospital March 2021 for Worsening shortness of breath March 2021 CTA showing PE lower lobe branch on the right EKG with atrial fibrillation rate 151 bpm Negative for DVT Anticoagulated with Eliquis, rate control metoprolol  diltiazem digoxin  Echocardiogram ejection fraction 35 to 40%, in atrial fibrillation   PMH:   has a past medical history of Alcohol abuse, Cardiomyopathy ([place]), Chronic pain, COVID-19, Persistent atrial fibrillation ([place]), and Pulmonary embolism ([place]).  PSH:   History reviewed. No pertinent surgical history.  Current Outpatient Medications  Medication Sig Dispense Refill  . digoxin (LANOXIN) 0.25 MG tablet Take 1 tablet (0.25 mg total) by mouth daily. 30 tablet 1  . diltiazem (CARDIZEM CD) 360 MG 24 hr capsule Take 1 capsule (360 mg total) by mouth daily. 90 capsule 3  . furosemide (LASIX) 20 MG tablet Take 1 tablet (20 mg total) by mouth daily as needed (As needed for leg swelling). 90 tablet 3  . metoprolol succinate (TOPROL-XL) 100 MG 24 hr tablet Take 1 tablet (100 mg total) by mouth daily. Take with or immediately following a meal. 90 tablet 3  . metoprolol succinate (TOPROL-XL) 50 MG 24 hr tablet Take 1 tablet (50 mg total) by mouth daily. Take with or immediately following a meal. 90 tablet 3  . rivaroxaban (XARELTO) 20 MG TABS tablet Take 1 tablet (20 mg total) by mouth daily with supper. 30 tablet 11   No current facility-administered medications for this visit.    Allergies:   Patient has no known allergies.   Social History:  The patient  reports that he has never smoked. His smokeless tobacco use includes chew. He reports current alcohol use of about 56.0 standard drinks of alcohol per week. He reports previous drug use.  Family History:   family history includes Bone cancer in his father; Diabetes in his sister; Stroke in his father.    Review of Systems: Review of Systems  Constitutional: Negative.   HENT: Negative.   Respiratory: Negative.   Cardiovascular: Negative.   Gastrointestinal: Negative.   Musculoskeletal: Negative.   Neurological: Negative.   Psychiatric/Behavioral: Negative.   All other systems reviewed and are negative.   PHYSICAL EXAM: VS:  BP  122/90 (BP Location: Left Arm, Patient Position: Sitting, Cuff Size: Normal)   Pulse 62   Ht 5\' 10"  (1.778 m)   Wt 196 lb 6 oz (89.1 kg)   SpO2 97%   BMI 28.18 kg/m  , BMI Body mass index is 28.18 kg/m. Constitutional:  oriented to person, place, and time. No distress.  HENT:  Head: Grossly normal Eyes:  no discharge. No scleral icterus.  Neck: No JVD, no carotid bruits  Cardiovascular: Irregularly irregular no murmurs appreciated Pulmonary/Chest: Clear to auscultation bilaterally, no wheezes or rails Abdominal: Soft.  no distension.  no tenderness.  Musculoskeletal: Normal range of motion Neurological:  normal muscle tone. Coordination normal. No atrophy Skin: Skin warm and dry Psychiatric: normal affect, pleasant  Recent Labs: 03/14/2019: B Natriuretic Peptide 316.0 06/07/2019: ALT 24; TSH 1.823 06/11/2019: BUN 11; Creatinine, Ser 0.82; Hemoglobin 13.4; Magnesium 2.2; Platelets 133; Potassium 4.0; Sodium 139    Lipid Panel No results found for: CHOL, HDL, LDLCALC, TRIG    Wt Readings from Last 3 Encounters:  07/25/19 196 lb 6 oz (89.1 kg)  06/21/19 196 lb 8 oz (89.1 kg)  06/17/19 196 lb 3.2 oz (89 kg)     ASSESSMENT AND PLAN:  Problem List Items Addressed This Visit      Cardiology Problems   Acute pulmonary embolism (HCC)   Chronic combined systolic and diastolic CHF (congestive heart failure) ([place]) - Primary   Relevant Orders   EKG 12-Lead   Dilated cardiomyopathy ([place])     Other   Alcohol abuse    Other Visit Diagnoses    Persistent atrial fibrillation ([place])       Relevant Orders   EKG 12-Lead     Atrial fibrillation, persistent We will set up cardioversion No medication changes at this time We will likely need to adjust his medications lower post-cardioversion Will not start antiarrhythmic at this time Risk and benefit discussed with him in detail He is willing to proceed with cardioversion later this week if schedule permits  Essential  hypertension Blood pressure stable on current medications No changes made  Pulmonary embolism On anticoagulation We will discuss timing when he can come off following cardioversion  Cardiomyopathy  avoid alcohol We will look to restore normal sinus rhythm  Disposition:   F/U  6 weeks   Total encounter time more than 25 minutes  Greater than 50% was spent in counseling and coordination of care with the patient    Signed, Esmond Plants, M.D., Ph.D. [place], [place]

## 2019-07-25 ENCOUNTER — Ambulatory Visit (INDEPENDENT_AMBULATORY_CARE_PROVIDER_SITE_OTHER): Payer: 59 | Admitting: Cardiovascular Disease

## 2019-07-25 ENCOUNTER — Other Ambulatory Visit: Payer: Self-pay

## 2019-07-25 ENCOUNTER — Encounter: Payer: Self-pay | Admitting: Cardiovascular Disease

## 2019-07-25 VITALS — BP 122/90 | HR 62 | Ht 70.0 in | Wt 196.4 lb

## 2019-07-25 DIAGNOSIS — I2699 Other pulmonary embolism without acute cor pulmonale: Secondary | ICD-10-CM | POA: Diagnosis not present

## 2019-07-25 DIAGNOSIS — I4819 Other persistent atrial fibrillation: Secondary | ICD-10-CM

## 2019-07-25 DIAGNOSIS — I5042 Chronic combined systolic (congestive) and diastolic (congestive) heart failure: Secondary | ICD-10-CM

## 2019-07-25 DIAGNOSIS — F1010 Alcohol abuse, uncomplicated: Secondary | ICD-10-CM

## 2019-07-25 DIAGNOSIS — I420 Dilated cardiomyopathy: Secondary | ICD-10-CM

## 2019-07-25 NOTE — Patient Instructions (Addendum)
Medication Instructions:  No changes  If you need a refill on your cardiac medications before your next appointment, please call your pharmacy.    Lab work: No new labs needed   If you have labs (blood work) drawn today and your tests are completely normal, you will receive your results only by: Marland Kitchen MyChart Message (if you have MyChart) OR . A paper copy in the mail If you have any lab test that is abnormal or we need to change your treatment, we will call you to review the results.   Testing/Procedures: You are scheduled for a Cardioversion on Friday the 14th with Dr. Rockey Situ  Please arrive at the [place] [facility] at 06:30 a.m. on the day of your procedure.  DIET INSTRUCTIONS:  Nothing to eat or drink after midnight except your medications with a small sip of water.         1) Labs: CBC & BMP done today 2) COVID swab: Go to [facility] on Wednesday May 12th between 8 AM to 1PM. Once done please go home and quarantine until the day of your procedure.   3) Medications:  YOU MAY TAKE ALL of your medications with a small amount of water.  4) Must have a responsible person to drive you home.  5) Bring a current list of your medications and current insurance cards.    If you have any questions after you get home, please call the office at [phone]    Follow-Up: At [facility], you and your health needs are our priority.  As part of our continuing mission to provide you with exceptional heart care, we have created designated Provider Care Teams.  These Care Teams include your primary Cardiologist (physician) and Advanced Practice Providers (APPs -  Physician Assistants and Nurse Practitioners) who all work together to provide you with the care you need, when you need it.  . You will need a follow up appointment in 1 month   . Providers on your designated Care Team:   . Murray Hodgkins, NP . Christell Faith, PA-C . Marrianne Mood, PA-C  Any Other Special  Instructions Will Be Listed Below (If Applicable).  For educational health videos Log in to : [email or web address] Or : [email or web address], password : [email or web address]

## 2019-07-26 LAB — CBC
Hematocrit: 45.9 % (ref 37.5–51.0)
Hemoglobin: 15 g/dL (ref 13.0–17.7)
MCH: 31.3 pg (ref 26.6–33.0)
MCHC: 32.7 g/dL (ref 31.5–35.7)
MCV: 96 fL (ref 79–97)
Platelets: 181 10*3/uL (ref 150–450)
RBC: 4.8 x10E6/uL (ref 4.14–5.80)
RDW: 13.9 % (ref 11.6–15.4)
WBC: 5.2 10*3/uL (ref 3.4–10.8)

## 2019-07-26 LAB — BASIC METABOLIC PANEL
BUN/Creatinine Ratio: 9 (ref 9–20)
BUN: 10 mg/dL (ref 6–24)
CO2: 21 mmol/L (ref 20–29)
Calcium: 9.6 mg/dL (ref 8.7–10.2)
Chloride: 103 mmol/L (ref 96–106)
Creatinine, Ser: 1.15 mg/dL (ref 0.76–1.27)
GFR calc Af Amer: 81 mL/min/{1.73_m2} (ref >59)
GFR calc non Af Amer: 70 mL/min/{1.73_m2} (ref >59)
Glucose: 118 mg/dL — ABNORMAL HIGH (ref 65–99)
Potassium: 4.4 mmol/L (ref 3.5–5.2)
Sodium: 141 mmol/L (ref 134–144)

## 2019-07-27 ENCOUNTER — Other Ambulatory Visit: Payer: Self-pay

## 2019-07-27 ENCOUNTER — Other Ambulatory Visit
Admission: RE | Admit: 2019-07-27 | Discharge: 2019-07-27 | Disposition: A | Discharge status: Home / Self Care | Payer: 59 | Source: Ambulatory Visit | Attending: Cardiovascular Disease | Admitting: Cardiovascular Disease

## 2019-07-27 DIAGNOSIS — Z01812 Encounter for preprocedural laboratory examination: Secondary | ICD-10-CM | POA: Insufficient documentation

## 2019-07-27 DIAGNOSIS — Z20822 Contact with and (suspected) exposure to covid-19: Secondary | ICD-10-CM | POA: Insufficient documentation

## 2019-07-27 LAB — SARS CORONAVIRUS 2 (TAT 6-24 HRS): SARS Coronavirus 2: NEGATIVE

## 2019-07-28 ENCOUNTER — Other Ambulatory Visit: Payer: Self-pay | Admitting: Cardiovascular Disease

## 2019-07-29 ENCOUNTER — Ambulatory Visit
Admission: RE | Admit: 2019-07-29 | Discharge: 2019-07-29 | Disposition: A | Discharge status: Home / Self Care | Payer: 59 | Attending: Cardiovascular Disease | Admitting: Cardiovascular Disease

## 2019-07-29 ENCOUNTER — Ambulatory Visit: Payer: 59 | Admitting: Anesthesiology

## 2019-07-29 ENCOUNTER — Encounter
Admission: RE | Disposition: A | Discharge status: Home / Self Care | Payer: Self-pay | Source: Home / Self Care | Attending: Cardiovascular Disease

## 2019-07-29 ENCOUNTER — Other Ambulatory Visit: Payer: Self-pay

## 2019-07-29 ENCOUNTER — Encounter: Payer: Self-pay | Admitting: Cardiovascular Disease

## 2019-07-29 DIAGNOSIS — I420 Dilated cardiomyopathy: Secondary | ICD-10-CM | POA: Insufficient documentation

## 2019-07-29 DIAGNOSIS — I509 Heart failure, unspecified: Secondary | ICD-10-CM | POA: Diagnosis not present

## 2019-07-29 DIAGNOSIS — F1010 Alcohol abuse, uncomplicated: Secondary | ICD-10-CM | POA: Diagnosis not present

## 2019-07-29 DIAGNOSIS — I110 Hypertensive heart disease with heart failure: Secondary | ICD-10-CM | POA: Insufficient documentation

## 2019-07-29 DIAGNOSIS — Z8616 Personal history of COVID-19: Secondary | ICD-10-CM | POA: Insufficient documentation

## 2019-07-29 DIAGNOSIS — I4819 Other persistent atrial fibrillation: Secondary | ICD-10-CM | POA: Insufficient documentation

## 2019-07-29 DIAGNOSIS — Z7901 Long term (current) use of anticoagulants: Secondary | ICD-10-CM | POA: Diagnosis not present

## 2019-07-29 DIAGNOSIS — Z79899 Other long term (current) drug therapy: Secondary | ICD-10-CM | POA: Insufficient documentation

## 2019-07-29 DIAGNOSIS — Z86711 Personal history of pulmonary embolism: Secondary | ICD-10-CM | POA: Diagnosis not present

## 2019-07-29 HISTORY — PX: CARDIOVERSION: SHX1299

## 2019-07-29 SURGERY — CARDIOVERSION
Anesthesia: General

## 2019-07-29 MED ORDER — SODIUM CHLORIDE 0.9 % IV SOLN
INTRAVENOUS | Status: DC | PRN
Start: 2019-07-29 — End: 2019-07-29

## 2019-07-29 MED ORDER — PROPOFOL 10 MG/ML IV BOLUS
INTRAVENOUS | Status: DC | PRN
  Administered 2019-07-29: 20 mg via INTRAVENOUS
  Administered 2019-07-29: 80 mg via INTRAVENOUS

## 2019-07-29 NOTE — Progress Notes (Signed)
Cardioversion procedure note For atrial fibrillation, persistent.  Procedure Details:  Consent: Risks of procedure as well as the alternatives and risks of each were explained to the (patient/caregiver).  Consent for procedure obtained.  Time Out: Verified patient identification, verified procedure, site/side was marked, verified correct patient position, special equipment/implants available, medications/allergies/relevent history reviewed, required imaging and test results available.  Performed  Patient placed on cardiac monitor, pulse oximetry, supplemental oxygen as necessary.   Sedation given: propofol IV, Dr. Zak Pacer pads placed anterior and posterior chest.   Cardioverted 1 time(s).   Cardioverted at  150 J. Synchronized biphasic Converted to NSR   Evaluation: Findings: Post procedure EKG shows: NSR Complications: None Patient did tolerate procedure well.  Time Spent Directly with the Patient:  45 minutes   Tim Leondro Coryell, M.D., Ph.D.  

## 2019-07-29 NOTE — Transfer of Care (Signed)
Immediate Anesthesia Transfer of Care Note  Patient: Calvin Esparza  Procedure(s) Performed: CARDIOVERSION (N/A )  Patient Location: PACU  Anesthesia Type:General  Level of Consciousness: sedated  Airway & Oxygen Therapy: Patient Spontanous Breathing and Patient connected to nasal cannula oxygen  Post-op Assessment: Report given to RN and Post -op Vital signs reviewed and stable  Post vital signs: Reviewed and stable  Last Vitals:  Vitals Value Taken Time  BP 132/84 07/29/19 0739  Temp 36.7 C 07/29/19 0725  Pulse 68 07/29/19 0739  Resp 20 07/29/19 0739  SpO2 93 % 07/29/19 0739    Last Pain:  Vitals:   07/29/19 0725  PainSc: 0-No pain         Complications: No apparent anesthesia complications

## 2019-07-29 NOTE — Anesthesia Preprocedure Evaluation (Addendum)
Anesthesia Evaluation  Patient identified by MRN, date of birth, ID band Patient awake    Reviewed: Allergy & Precautions, NPO status , Patient's Chart, lab work & pertinent test results  History of Anesthesia Complications Negative for: history of anesthetic complications  Airway Mallampati: II  TM Distance: >3 FB Neck ROM: Full   Comment: Large beard Dental no notable dental hx. (+) Teeth Intact   Pulmonary shortness of breath and with exertion, neg sleep apnea, neg COPD, Patient abstained from smoking.Not current smoker,  Shortness of breath since covid infection 02/2019   Pulmonary exam normal breath sounds clear to auscultation       Cardiovascular Exercise Tolerance: Good METShypertension, Pt. on medications +CHF  (-) CAD and (-) Past MI + dysrhythmias Atrial Fibrillation  Rhythm:Irregular Rate:Normal - Systolic murmurs Recent PE  TTE 2021: 1. Left ventricular ejection fraction, by estimation, is 35 to 40%. The  left ventricle has moderately decreased function. The left ventricle  demonstrates global hypokinesis. There is mild left ventricular  hypertrophy. Left ventricular diastolic  parameters are consistent with Grade I diastolic dysfunction (impaired  relaxation).  2. Right ventricular systolic function is mildly reduced. The right  ventricular size is moderately enlarged.  3. Left atrial size was mildly dilated.  4. Right atrial size was mild to moderately dilated.  5. The mitral valve is grossly normal. No evidence of mitral valve  regurgitation.  6. The aortic valve is tricuspid. Aortic valve regurgitation is trivial.  7. The inferior vena cava is normal in size with greater than 50%  respiratory variability, suggesting right atrial pressure of 3 mmHg.    Neuro/Psych negative neurological ROS  negative psych ROS   GI/Hepatic neg GERD  ,(+)     (-) substance abuse  ,   Endo/Other  neg diabetes  Renal/GU negative Renal ROS     Musculoskeletal   Abdominal   Peds  Hematology   Anesthesia Other Findings Past Medical History: No date: Alcohol abuse No date: Cardiomyopathy [facility])     Comment:  a. 05/2019 Echo: EF 35-40%, gr1 DD. Mod enlarged RV.               Mildly dil LA. Mild to mod dil RA. Triv AI. No date: Chronic pain No date: COVID-19     Comment:  a. 02/2019 No date: Persistent atrial fibrillation (HCC) No date: Pulmonary embolism ([place])     Comment:  a. 05/2019 CTA Chest: mild amt of PE w/in a lower lobe               branch of RPA.  Reproductive/Obstetrics                            Anesthesia Physical Anesthesia Plan  ASA: III  Anesthesia Plan: General   Post-op Pain Management:    Induction: Intravenous  PONV Risk Score and Plan: 2 and Ondansetron, Propofol infusion and TIVA  Airway Management Planned: Nasal Cannula  Additional Equipment: None  Intra-op Plan:   Post-operative Plan:   Informed Consent: I have reviewed the patients History and Physical, chart, labs and discussed the procedure including the risks, benefits and alternatives for the proposed anesthesia with the patient or authorized representative who has indicated his/her understanding and acceptance.     Dental advisory given  Plan Discussed with: CRNA and Surgeon  Anesthesia Plan Comments: (Discussed risks of anesthesia with patient, including possibility of difficulty with spontaneous ventilation under anesthesia necessitating  airway intervention, PONV, and rare risks such as cardiac or respiratory or neurological events. Patient understands.)        Anesthesia Quick Evaluation

## 2019-07-29 NOTE — Anesthesia Postprocedure Evaluation (Signed)
Anesthesia Post Note  Patient: Calvin Esparza  Procedure(s) Performed: CARDIOVERSION (N/A )  Patient location during evaluation: PACU Anesthesia Type: General Level of consciousness: awake and alert Pain management: pain level controlled Vital Signs Assessment: post-procedure vital signs reviewed and stable Respiratory status: spontaneous breathing, nonlabored ventilation, respiratory function stable and patient connected to nasal cannula oxygen Cardiovascular status: blood pressure returned to baseline and stable Postop Assessment: no apparent nausea or vomiting Anesthetic complications: no     Last Vitals:  Vitals:   07/29/19 0738 07/29/19 0739  BP:  132/84  Pulse: 70 68  Resp: 18 20  Temp:    SpO2: 95% 93%    Last Pain:  Vitals:   07/29/19 0725  PainSc: 0-No pain                 Arita Miss

## 2019-07-29 NOTE — H&P (Signed)
H&P Addendum, pre-cardioversion  Patient was seen and evaluated prior to -cardioversion procedure Symptoms, prior testing details again confirmed with the patient Patient examined, no significant change from prior exam Lab work reviewed in detail personally by myself Patient understands risk and benefit of the procedure, willing to proceed  Signed, Tim Tationa Stech, MD, Ph.D [facility]  

## 2019-08-25 ENCOUNTER — Ambulatory Visit: Payer: 59 | Admitting: Nurse Practitioner

## 2019-09-08 ENCOUNTER — Other Ambulatory Visit: Payer: Self-pay

## 2019-09-08 ENCOUNTER — Emergency Department: Payer: 59

## 2019-09-08 ENCOUNTER — Inpatient Hospital Stay
Admission: EM | Admit: 2019-09-08 | Discharge: 2019-09-10 | DRG: 175 | Disposition: A | Discharge status: Home / Self Care | Payer: 59 | Attending: Internal Medicine | Admitting: Internal Medicine

## 2019-09-08 ENCOUNTER — Encounter: Payer: Self-pay | Admitting: Emergency Medicine

## 2019-09-08 DIAGNOSIS — R0602 Shortness of breath: Secondary | ICD-10-CM | POA: Diagnosis not present

## 2019-09-08 DIAGNOSIS — Z91138 Patient's unintentional underdosing of medication regimen for other reason: Secondary | ICD-10-CM | POA: Diagnosis not present

## 2019-09-08 DIAGNOSIS — T45516A Underdosing of anticoagulants, initial encounter: Secondary | ICD-10-CM | POA: Diagnosis present

## 2019-09-08 DIAGNOSIS — I82812 Embolism and thrombosis of superficial veins of left lower extremities: Secondary | ICD-10-CM

## 2019-09-08 DIAGNOSIS — I5022 Chronic systolic (congestive) heart failure: Secondary | ICD-10-CM | POA: Diagnosis not present

## 2019-09-08 DIAGNOSIS — I712 Thoracic aortic aneurysm, without rupture: Secondary | ICD-10-CM | POA: Diagnosis present

## 2019-09-08 DIAGNOSIS — I4819 Other persistent atrial fibrillation: Secondary | ICD-10-CM

## 2019-09-08 DIAGNOSIS — F1010 Alcohol abuse, uncomplicated: Secondary | ICD-10-CM | POA: Diagnosis present

## 2019-09-08 DIAGNOSIS — I4891 Unspecified atrial fibrillation: Secondary | ICD-10-CM | POA: Diagnosis not present

## 2019-09-08 DIAGNOSIS — I2699 Other pulmonary embolism without acute cor pulmonale: Secondary | ICD-10-CM | POA: Diagnosis present

## 2019-09-08 DIAGNOSIS — F17220 Nicotine dependence, chewing tobacco, uncomplicated: Secondary | ICD-10-CM | POA: Diagnosis present

## 2019-09-08 DIAGNOSIS — Z823 Family history of stroke: Secondary | ICD-10-CM

## 2019-09-08 DIAGNOSIS — I5023 Acute on chronic systolic (congestive) heart failure: Secondary | ICD-10-CM

## 2019-09-08 DIAGNOSIS — I420 Dilated cardiomyopathy: Secondary | ICD-10-CM | POA: Diagnosis not present

## 2019-09-08 DIAGNOSIS — I4892 Unspecified atrial flutter: Secondary | ICD-10-CM | POA: Diagnosis present

## 2019-09-08 DIAGNOSIS — M7989 Other specified soft tissue disorders: Secondary | ICD-10-CM

## 2019-09-08 DIAGNOSIS — I2609 Other pulmonary embolism with acute cor pulmonale: Secondary | ICD-10-CM | POA: Diagnosis not present

## 2019-09-08 DIAGNOSIS — M109 Gout, unspecified: Secondary | ICD-10-CM | POA: Diagnosis present

## 2019-09-08 DIAGNOSIS — I5042 Chronic combined systolic (congestive) and diastolic (congestive) heart failure: Secondary | ICD-10-CM | POA: Diagnosis present

## 2019-09-08 DIAGNOSIS — Z8616 Personal history of COVID-19: Secondary | ICD-10-CM | POA: Diagnosis not present

## 2019-09-08 DIAGNOSIS — Z833 Family history of diabetes mellitus: Secondary | ICD-10-CM | POA: Diagnosis not present

## 2019-09-08 DIAGNOSIS — Z86711 Personal history of pulmonary embolism: Secondary | ICD-10-CM

## 2019-09-08 DIAGNOSIS — I428 Other cardiomyopathies: Secondary | ICD-10-CM | POA: Diagnosis present

## 2019-09-08 DIAGNOSIS — I5043 Acute on chronic combined systolic (congestive) and diastolic (congestive) heart failure: Secondary | ICD-10-CM | POA: Diagnosis present

## 2019-09-08 HISTORY — DX: Other cardiomyopathies: I42.8

## 2019-09-08 HISTORY — DX: Unspecified systolic (congestive) heart failure: I50.20

## 2019-09-08 LAB — BASIC METABOLIC PANEL
Anion gap: 10 (ref 5–15)
BUN: 18 mg/dL (ref 6–20)
CO2: 22 mmol/L (ref 22–32)
Calcium: 8.8 mg/dL — ABNORMAL LOW (ref 8.9–10.3)
Chloride: 105 mmol/L (ref 98–111)
Creatinine, Ser: 0.92 mg/dL (ref 0.61–1.24)
GFR calc Af Amer: >60 mL/min (ref >60)
GFR calc non Af Amer: >60 mL/min (ref >60)
Glucose, Bld: 116 mg/dL — ABNORMAL HIGH (ref 70–99)
Potassium: 4.4 mmol/L (ref 3.5–5.1)
Sodium: 137 mmol/L (ref 135–145)

## 2019-09-08 LAB — CBC
HCT: 40.7 % (ref 39.0–52.0)
Hemoglobin: 13.3 g/dL (ref 13.0–17.0)
MCH: 31.4 pg (ref 26.0–34.0)
MCHC: 32.7 g/dL (ref 30.0–36.0)
MCV: 96 fL (ref 80.0–100.0)
Platelets: 203 10*3/uL (ref 150–400)
RBC: 4.24 MIL/uL (ref 4.22–5.81)
RDW: 14.6 % (ref 11.5–15.5)
WBC: 7.4 10*3/uL (ref 4.0–10.5)
nRBC: 0 % (ref 0.0–0.2)

## 2019-09-08 LAB — BRAIN NATRIURETIC PEPTIDE: B Natriuretic Peptide: 669.8 pg/mL — ABNORMAL HIGH (ref 0.0–100.0)

## 2019-09-08 LAB — HEPARIN LEVEL (UNFRACTIONATED)
Heparin Unfractionated: 2.28 IU/mL — ABNORMAL HIGH (ref 0.30–0.70)
Heparin Unfractionated: <0.1 IU/mL — ABNORMAL LOW (ref 0.30–0.70)

## 2019-09-08 LAB — PROTIME-INR
INR: 1.1 (ref 0.8–1.2)
Prothrombin Time: 13.4 seconds (ref 11.4–15.2)

## 2019-09-08 LAB — SARS CORONAVIRUS 2 BY RT PCR (HOSPITAL ORDER, PERFORMED IN ~~LOC~~ HOSPITAL LAB): SARS Coronavirus 2: NEGATIVE

## 2019-09-08 LAB — TROPONIN I (HIGH SENSITIVITY)
Troponin I (High Sensitivity): 6 ng/L (ref <18)
Troponin I (High Sensitivity): 8 ng/L (ref <18)

## 2019-09-08 LAB — APTT: aPTT: 25 seconds (ref 24–36)

## 2019-09-08 MED ORDER — HEPARIN BOLUS VIA INFUSION
5000.0000 [IU] | Freq: Once | INTRAVENOUS | Status: AC
  Administered 2019-09-08: 5000 [IU] via INTRAVENOUS
  Filled 2019-09-08: qty 5000

## 2019-09-08 MED ORDER — METOPROLOL SUCCINATE ER 100 MG PO TB24
100.0000 mg | ORAL_TABLET | Freq: Every day | ORAL | Status: DC
  Administered 2019-09-08 – 2019-09-10 (×3): 100 mg via ORAL
  Filled 2019-09-08 (×2): qty 1
  Filled 2019-09-08: qty 2

## 2019-09-08 MED ORDER — IOHEXOL 350 MG/ML SOLN
75.0000 mL | Freq: Once | INTRAVENOUS | Status: AC | PRN
  Administered 2019-09-08: 75 mL via INTRAVENOUS

## 2019-09-08 MED ORDER — FUROSEMIDE 20 MG PO TABS: 20.0000 mg | ORAL_TABLET | Freq: Every day | ORAL | Status: DC | PRN

## 2019-09-08 MED ORDER — DILTIAZEM HCL-DEXTROSE 125-5 MG/125ML-% IV SOLN (PREMIX)
5.0000 mg/h | INTRAVENOUS | Status: DC
  Administered 2019-09-08: 5 mg/h via INTRAVENOUS
  Filled 2019-09-08 (×2): qty 125

## 2019-09-08 MED ORDER — ONDANSETRON HCL 4 MG/2ML IJ SOLN: 4.0000 mg | Freq: Four times a day (QID) | INTRAMUSCULAR | Status: DC | PRN

## 2019-09-08 MED ORDER — DILTIAZEM HCL-DEXTROSE 125-5 MG/125ML-% IV SOLN (PREMIX)
5.0000 mg/h | INTRAVENOUS | Status: DC
  Administered 2019-09-08: 10 mg/h via INTRAVENOUS
  Administered 2019-09-09 (×2): 5 mg/h via INTRAVENOUS
  Filled 2019-09-08: qty 125

## 2019-09-08 MED ORDER — ACETAMINOPHEN 325 MG PO TABS: 650.0000 mg | ORAL_TABLET | ORAL | Status: DC | PRN

## 2019-09-08 MED ORDER — MORPHINE SULFATE (PF) 2 MG/ML IV SOLN: 2.0000 mg | INTRAVENOUS | Status: DC | PRN

## 2019-09-08 MED ORDER — SODIUM CHLORIDE 0.9% FLUSH
3.0000 mL | Freq: Once | INTRAVENOUS | Status: AC
  Administered 2019-09-08: 3 mL via INTRAVENOUS

## 2019-09-08 MED ORDER — METOPROLOL SUCCINATE ER 50 MG PO TB24
50.0000 mg | ORAL_TABLET | Freq: Every day | ORAL | Status: DC
  Administered 2019-09-08 – 2019-09-09 (×2): 50 mg via ORAL
  Filled 2019-09-08 (×2): qty 1

## 2019-09-08 MED ORDER — OXYCODONE HCL 5 MG PO TABS
5.0000 mg | ORAL_TABLET | Freq: Four times a day (QID) | ORAL | Status: DC | PRN
  Administered 2019-09-08 – 2019-09-09 (×2): 5 mg via ORAL
  Filled 2019-09-08 (×2): qty 1

## 2019-09-08 MED ORDER — HEPARIN (PORCINE) 25000 UT/250ML-% IV SOLN
1250.0000 [IU]/h | INTRAVENOUS | Status: AC
  Administered 2019-09-08: 1450 [IU]/h via INTRAVENOUS
  Administered 2019-09-09: 1350 [IU]/h via INTRAVENOUS
  Administered 2019-09-10: 1250 [IU]/h via INTRAVENOUS
  Filled 2019-09-08 (×3): qty 250

## 2019-09-08 NOTE — ED Notes (Signed)
Pt ambulatory to the restroom.  

## 2019-09-08 NOTE — H&P (Signed)
[facility] History and Physical  Calvin Esparza UVO:[id number] DOB: 1960/06/22 DOA: 09/08/2019  PCP: [name], MD  Patient coming from: Home  Chief Complaint: Shortness of breath, leg pain  HPI: Calvin Esparza is a 59 y.o. male with a medical history of CHF, COVID-19 in December 2020, atrial fibrillation, pulmonary embolism, presented to the hospital with complaints of shortness of breath and leg pain.  Patient states that his shortness of breath is been on and off since having Covid however has worsened over the last week.  Also started having left lower extremity pain.  Patient admits to not taking his Xarelto and other medications for the past week as he was placed on prednisone for foot pain and this made him "loopy".  Currently patient denies chest pain, dizziness or headache, abdominal pain, nausea or vomiting, diarrhea or constipation, changes in bowel or urinary habits, recent illness or travel.  ED Course: Presented with shortness of breath, found to have new PEs on CTA chest.  Also found to have atrial fibrillation/flutter, placed on Cardizem and heparin drips.  TRH called for admission.  Review of Systems:  All other systems reviewed and are negative.   Past Medical History:  Diagnosis Date  . Alcohol abuse   . Cardiomyopathy ([place])    a. 05/2019 Echo: EF 35-40%, gr1 DD. Mod enlarged RV. Mildly dil LA. Mild to mod dil RA. Triv AI.  Marland Kitchen Chronic pain   . COVID-19    a. 02/2019  . Persistent atrial fibrillation ([place])   . Pulmonary embolism ([place])    a. 05/2019 CTA Chest: mild amt of PE w/in a lower lobe branch of RPA.    Past Surgical History:  Procedure Laterality Date  . CARDIOVERSION N/A 07/29/2019   Procedure: CARDIOVERSION;  Surgeon: Minna Merritts, MD;  Location: [facility] ORS;  Service: Cardiovascular;  Laterality: N/A;    Social History:  reports that he has never smoked. His smokeless tobacco use includes chew. He reports current alcohol use of  about 24.0 standard drinks of alcohol per week. He reports previous drug use.   No Known Allergies  Family History  Problem Relation Age of Onset  . Bone cancer Father   . Stroke Father   . Diabetes Sister      Prior to Admission medications   Medication Sig Start Date End Date Taking? Authorizing Provider  diltiazem (CARDIZEM CD) 360 MG 24 hr capsule Take 360 mg by mouth daily.   Yes [provider]  furosemide (LASIX) 20 MG tablet Take 1 tablet (20 mg total) by mouth daily as needed (As needed for leg swelling). Patient taking differently: Take 20 mg by mouth daily as needed for edema.  06/21/19 09/19/19 Yes Gollan, Kathlene November, MD  metoprolol succinate (TOPROL-XL) 100 MG 24 hr tablet Take 1 tablet (100 mg total) by mouth daily. Take with or immediately following a meal. Patient taking differently: Take 100 mg by mouth at bedtime.  06/21/19  Yes Minna Merritts, MD  metoprolol succinate (TOPROL-XL) 50 MG 24 hr tablet Take 1 tablet (50 mg total) by mouth daily. Take with or immediately following a meal. Patient taking differently: Take 50 mg by mouth at bedtime.  06/21/19  Yes Minna Merritts, MD  predniSONE (DELTASONE) 10 MG tablet Take by mouth. 08/31/19  Yes [provider]  rivaroxaban (XARELTO) 20 MG TABS tablet Take 1 tablet (20 mg total) by mouth daily with supper. 06/21/19  Yes Minna Merritts, MD  Physical Exam: Vitals:   09/08/19 1154 09/08/19 1230  BP: (!) 140/116 (!) 145/93  Pulse: (!) 141   Resp: 18 19  Temp: 97.8 F (36.6 C)   SpO2: 97%      General: Well developed, well nourished, NAD, appears stated age  HEENT: NCAT, PERRLA, EOMI, Anicteic Sclera, mucous membranes moist.   Neck: Supple, no JVD, no masses  Cardiovascular: S1 S2 auscultated, irregularly irregular  Respiratory: Clear to auscultation bilaterally with equal chest rise  Abdomen: Soft, nontender, nondistended, + bowel sounds  Extremities: warm dry without cyanosis clubbing or  pitting edema. Bruising on left foot  Neuro: AAOx3, cranial nerves grossly intact. Strength 5/5 in patient's upper and lower extremities bilaterally  Skin: Without rashes exudates or nodules  Psych: Normal affect and demeanor with intact judgement and insight  Labs on Admission: I have personally reviewed following labs and imaging studies CBC: Recent Labs  Lab 09/08/19 1158  WBC 7.4  HGB 13.3  HCT 40.7  MCV 96.0  PLT 191   Basic Metabolic Panel: Recent Labs  Lab 09/08/19 1158  NA 137  K 4.4  CL 105  CO2 22  GLUCOSE 116*  BUN 18  CREATININE 0.92  CALCIUM 8.8*   GFR: Estimated Creatinine Clearance: 100.4 mL/min (by C-G formula based on SCr of 0.92 mg/dL). Liver Function Tests: No results for input(s): AST, ALT, ALKPHOS, BILITOT, PROT, ALBUMIN in the last 168 hours. No results for input(s): LIPASE, AMYLASE in the last 168 hours. No results for input(s): AMMONIA in the last 168 hours. Coagulation Profile: Recent Labs  Lab 09/08/19 1158  INR 1.1   Cardiac Enzymes: No results for input(s): CKTOTAL, CKMB, CKMBINDEX, TROPONINI in the last 168 hours. BNP (last 3 results) No results for input(s): PROBNP in the last 8760 hours. HbA1C: No results for input(s): HGBA1C in the last 72 hours. CBG: No results for input(s): GLUCAP in the last 168 hours. Lipid Profile: No results for input(s): CHOL, HDL, LDLCALC, TRIG, CHOLHDL, LDLDIRECT in the last 72 hours. Thyroid Function Tests: No results for input(s): TSH, T4TOTAL, FREET4, T3FREE, THYROIDAB in the last 72 hours. Anemia Panel: No results for input(s): VITAMINB12, FOLATE, FERRITIN, TIBC, IRON, RETICCTPCT in the last 72 hours. Urine analysis: No results found for: COLORURINE, APPEARANCEUR, LABSPEC, PHURINE, GLUCOSEU, HGBUR, BILIRUBINUR, KETONESUR, PROTEINUR, UROBILINOGEN, NITRITE, LEUKOCYTESUR Sepsis Labs: @LABRCNTIP (procalcitonin:4,lacticidven:4) )No results found for this or any previous visit (from the past 240  hour(s)).   Radiological Exams on Admission: DG Chest 2 View  Result Date: 09/08/2019 CLINICAL DATA:  Shortness of breath. EXAM: CHEST - 2 VIEW COMPARISON:  06/07/2027 FINDINGS: The heart is in the range in size. Slight pulmonary vascular prominence. Tiny left pleural effusion is new. No infiltrates. No acute bone abnormality. IMPRESSION: New slight pulmonary vascular prominence and tiny left pleural effusion. Electronically Signed   By: Lorriane Shire M.D.   On: 09/08/2019 12:32   CT Angio Chest PE W/Cm &/Or Wo Cm  Result Date: 09/08/2019 CLINICAL DATA:  Shortness of breath today, LEFT thigh pain for 1 week, past history of pulmonary embolism no longer on anticoagulation EXAM: CT ANGIOGRAPHY CHEST WITH CONTRAST TECHNIQUE: Multidetector CT imaging of the chest was performed using the standard protocol during bolus administration of intravenous contrast. Multiplanar CT image reconstructions and MIPs were obtained to evaluate the vascular anatomy. CONTRAST:  70mL OMNIPAQUE IOHEXOL 350 MG/ML SOLN IV COMPARISON:  06/07/2019 FINDINGS: Cardiovascular: Minimal aneurysmal dilatation ascending thoracic aorta 4.0 cm transverse. Enlargement of cardiac chambers. No pericardial effusion. Enlarged  central pulmonary arteries, main pulmonary artery 3.6 cm transverse RIGHT LEFT pulmonary arteries 2.8 cm transverse. Pulmonary arteries well opacified. Small filling defects identified in RIGHT middle lobe and RIGHT lower lobe pulmonary arteries consistent with pulmonary emboli new since prior study. Minimal atherosclerotic calcification aorta. Mediastinum/Nodes: Esophagus unremarkable. Base of cervical region normal appearance. No thoracic adenopathy. Lungs/Pleura: Lungs clear. No infiltrate, pleural effusion, pneumothorax or mass. Upper Abdomen: Unremarkable Musculoskeletal: Unremarkable Review of the MIP images confirms the above findings. IMPRESSION: Small pulmonary emboli in RIGHT middle and RIGHT lower lobe pulmonary  arteries, new. Enlarged central pulmonary arteries which may reflect pulmonary arterial hypertension. Minimal aneurysmal dilatation of ascending thoracic aorta 4.0 cm transverse; recommend annual imaging followup by CTA or MRA. This recommendation follows the 2010 ACCF/AHA/AATS/ACR/ASA/SCA/SCAI/SIR/STS/SVM Guidelines for the Diagnosis and Management of Patients with Thoracic Aortic Disease. Circulation. 2010; 121: S010-X323. Aortic aneurysm NOS (ICD10-I71.9) Aortic Atherosclerosis (ICD10-I70.0). Aortic Aneurysm NOS (ICD10-I71.9). Critical Value/emergent results were called by telephone at the time of interpretation on 09/08/2019 at 1:44 pm to provider Dr. Quentin Cornwall, who verbally acknowledged these results. Electronically Signed   By: Lavonia Dana M.D.   On: 09/08/2019 13:46   US Venous Img Lower Unilateral Left (DVT)  Result Date: 09/08/2019 CLINICAL DATA:  Left leg swelling for 1 day.  Shortness of breath. EXAM: RIGHT LOWER EXTREMITY VENOUS DOPPLER ULTRASOUND TECHNIQUE: Gray-scale sonography with compression, as well as color and duplex ultrasound, were performed to evaluate the deep venous system(s) from the level of the common femoral vein through the popliteal and proximal calf veins. COMPARISON:  None. FINDINGS: VENOUS Normal compressibility of the common femoral, superficial femoral, and popliteal veins. Visualized portions of profunda femoral vein and great saphenous vein unremarkable. No filling defects to suggest DVT on grayscale or color Doppler imaging. Doppler waveforms show normal direction of venous flow, normal respiratory plasticity and response to augmentation. Thrombus is identified in the greater saphenous vein throughout almost the entire thigh. Limited views of the contralateral common femoral vein are unremarkable. OTHER None. Limitations: none IMPRESSION: The exam is positive for superficial venous thrombosis in the greater saphenous vein throughout almost the entire thigh. No DVT is  identified. Electronically Signed   By: Inge Rise M.D.   On: 09/08/2019 14:00    EKG: Independently reviewed.  Atrial flutter, rate 140  Assessment/Plan  Atrial fibrillation/flutter with RVR -Patient was supposed to be taking Xarelto as an outpatient however did not take any over the past week -Presented with heart rates in the 140s -Diltiazem drip along with heparin -Had cardioversion approximately 1 month ago -Cardiology consulted and appreciated -Will hold off on restarting metoprolol and Cardizem orally until further recommendations from cardiology  Acute PE/superficial thrombosis lower extremity -Patient was noted to have acute PE back in March 2021 and was placed on Xarelto-as above, patient has not taken his medication in over a week -CTA chest showed small pulmonary emboli in the right middle and right lower lobe pulmonary arteries, new.  Enlarged central pulmonary arteries which may reflect pulmonary arterial hypertension. -Left lower extremity ultrasound positive for superficial venous thrombosis in the greater saphenous vein throughout almost the entire thigh  Dyspnea -Patient was Covid positive back in December 2020 -Suspect multifactorial including atrial fibrillation with RVR as well as acute PE -Continue to treat underlying etiology  Ascending thoracic aortic aneurysm -Noted on CT chest -Aneurysmal dilatation of a sending thoracic aorta at 4 cm transverse -Patient will need follow-up imaging yearly  Chronic systolic/diastolic heart failure -Echocardiogram 06/08/2019 showed an  EF of 35 to 45%, grade 1 diastolic dysfunction -Continue Lasix -Patient currently appears to be euvolemic and compensated -Monitor intake and output, daily weights  DVT prophylaxis: heparin  Code Status: Full  Family Communication: None at bedside   Disposition Plan: Home  Consults called: Cardiology   Admission status: Inpatient-given patient's shortness of breath, atrial  fibrillation with RVR and recent new PEs, patient will require IV medications including Cardizem as well as heparin.  Suspect patient will require at least 2 midnights of hospitalization.  Time spent: 70 minutes  Delyla Sandeen D.O. [facility]  Between 7am to 7pm - Please see pager noted on [email or web address]  After 7pm go to [email or web address] 09/08/2019, 3:19 PM

## 2019-09-08 NOTE — ED Notes (Signed)
Spoke with the Agricultural consultant on 2A, [place]. Per Agricultural consultant, pt not able to come up stairs till after change of shift.

## 2019-09-08 NOTE — ED Provider Notes (Signed)
[facility] Emergency Department Provider Note  ____________________________________________   First MD Initiated Contact with Patient 09/08/19 1212     (approximate)  I have reviewed the triage vital signs and the nursing notes.  History  Chief Complaint Shortness of Breath and Leg Pain    HPI Calvin Esparza is a 59 y.o. male with a history of PE in March, atrial fibrillation s/p recent cardioversion in May, who presents to the emergency department for left lower extremity pain.  Patient states for the last 5 to 7 days he has had pain and swelling to his left thigh area.  He feels a cord/rope like abnormality in the area as well, which has been increasing in size and pain over this time period.  8/10 in severity, located to the left thigh area, describes it as a pulling, no radiation, no alleviating/aggravating components.  He denies any direct trauma.    He has missed his Eliquis for the last several days.  He also reports increased shortness of breath, primarily with exertion.  States when he walked from his car in the parking lot to the ER entrance he was more winded than he normally is.   Past Medical Hx Past Medical History:  Diagnosis Date  . Alcohol abuse   . Cardiomyopathy ([place])    a. 05/2019 Echo: EF 35-40%, gr1 DD. Mod enlarged RV. Mildly dil LA. Mild to mod dil RA. Triv AI.  Marland Kitchen Chronic pain   . COVID-19    a. 02/2019  . Persistent atrial fibrillation ([place])   . Pulmonary embolism ([place])    a. 05/2019 CTA Chest: mild amt of PE w/in a lower lobe branch of RPA.    Problem List Patient Active Problem List   Diagnosis Date Noted  . Dilated cardiomyopathy ([place]) 06/09/2019  . Chronic combined systolic and diastolic CHF (congestive heart failure) ([place]) 06/08/2019  . Acute pulmonary embolism ([place]) 06/07/2019  . Atrial fibrillation with RVR ([place]) 10/01/2018  . Alcohol abuse 10/01/2018    Past Surgical Hx Past Surgical History:  Procedure  Laterality Date  . CARDIOVERSION N/A 07/29/2019   Procedure: CARDIOVERSION;  Surgeon: Minna Merritts, MD;  Location: [facility] ORS;  Service: Cardiovascular;  Laterality: N/A;    Medications Prior to Admission medications   Medication Sig Start Date End Date Taking? Authorizing Provider  digoxin (LANOXIN) 0.25 MG tablet Take 1 tablet (0.25 mg total) by mouth daily. 06/12/19   Ezekiel Slocumb, DO  diltiazem (CARDIZEM CD) 360 MG 24 hr capsule Take 1 capsule (360 mg total) by mouth daily. 06/21/19   Minna Merritts, MD  furosemide (LASIX) 20 MG tablet Take 1 tablet (20 mg total) by mouth daily as needed (As needed for leg swelling). Patient taking differently: Take 20 mg by mouth daily as needed for edema.  06/21/19 09/19/19  Minna Merritts, MD  metoprolol succinate (TOPROL-XL) 100 MG 24 hr tablet Take 1 tablet (100 mg total) by mouth daily. Take with or immediately following a meal. Patient taking differently: Take 100 mg by mouth at bedtime.  06/21/19   Minna Merritts, MD  metoprolol succinate (TOPROL-XL) 50 MG 24 hr tablet Take 1 tablet (50 mg total) by mouth daily. Take with or immediately following a meal. Patient taking differently: Take 50 mg by mouth at bedtime.  06/21/19   Minna Merritts, MD  rivaroxaban (XARELTO) 20 MG TABS tablet Take 1 tablet (20 mg total) by mouth daily with supper. 06/21/19   Gollan,  Kathlene November, MD    Allergies Patient has no known allergies.  Family Hx Family History  Problem Relation Age of Onset  . Bone cancer Father   . Stroke Father   . Diabetes Sister     Social Hx Social History   Tobacco Use  . Smoking status: Never Smoker  . Smokeless tobacco: Current User    Types: Chew  Vaping Use  . Vaping Use: Never used  Substance Use Topics  . Alcohol use: Yes    Alcohol/week: 24.0 standard drinks    Types: 24 Cans of beer per week    Comment: 6 beers daily, weekend drinks more-  . Drug use: Not Currently    Review of Systems  Constitutional:  Negative for fever. Negative for chills. Eyes: Negative for visual changes. ENT: Negative for sore throat. Cardiovascular: Negative for chest pain. Respiratory: + for shortness of breath. Gastrointestinal: Negative for nausea. Negative for vomiting.  Genitourinary: Negative for dysuria. Musculoskeletal: + LLE pain Skin: Negative for rash. Neurological: Negative for headaches.   Physical Exam  Vital Signs: ED Triage Vitals  Enc Vitals Group     BP 09/08/19 1154 (!) 140/116     Pulse Rate 09/08/19 1154 (!) 141     Resp 09/08/19 1154 18     Temp 09/08/19 1154 97.8 F (36.6 C)     Temp Source 09/08/19 1154 Oral     SpO2 09/08/19 1154 97 %     Weight 09/08/19 1152 200 lb (90.7 kg)     Height 09/08/19 1152 5\' 10"  (1.778 m)     Head Circumference --      Peak Flow --      Pain Score 09/08/19 1151 8     Pain Loc --      Pain Edu? --      Excl. in [place]? --     Constitutional: Alert and oriented. Well appearing. NAD.  Head: Normocephalic. Atraumatic. Eyes: Conjunctivae clear. Sclera anicteric. Pupils equal and symmetric. Nose: No masses or lesions. No congestion or rhinorrhea. Mouth/Throat: Wearing mask.  Neck: No stridor. Trachea midline.  Cardiovascular: Tachycardic, irregular.  Extremities well perfused. Respiratory: Normal respiratory effort.  Lungs CTAB.  No hypoxia. Gastrointestinal: Soft. Non-distended. Non-tender.  Genitourinary: Deferred. Musculoskeletal: Palpable cordlike abnormality in the left medial thigh area.  This area is tender to palpation.  No crepitance.  Compartments are soft and compressible.  Distally neurovascularly intact. Neurologic:  Normal speech and language. No gross focal or lateralizing neurologic deficits are appreciated.  Equal and symmetric LLE and RLE strength.  Sensation intact light touch. Skin: Skin is warm, dry and intact. No rash noted. Psychiatric: Mood and affect are appropriate for situation.  EKG  Personally reviewed and interpreted  by myself.   Date: 09/08/2019 Time: 2:02 PM Rate: 140 Rhythm: Atrial flutter, with variable block Axis: Normal Intervals: Within normal limits Atrial flutter, variable block, RVR No STEMI    Radiology  Personally reviewed available imaging myself.   CT PE-  IMPRESSION:  -Small pulmonary emboli in RIGHT middle and RIGHT lower lobe  pulmonary arteries, new.  -Enlarged central pulmonary arteries which may reflect pulmonary  arterial hypertension.  -Minimal aneurysmal dilatation of ascending thoracic aorta 4.0 cm  transverse; recommend annual imaging followup by CTA or MRA. This  recommendation follows the 2010  ACCF/AHA/AATS/ACR/ASA/SCA/SCAI/SIR/STS/SVM Guidelines for the  Diagnosis and Management of Patients with Thoracic Aortic Disease.  Circulation. 2010; 121: J628-Z662. Aortic aneurysm NOS (ICD10-I71.9)  -Aortic Atherosclerosis (ICD10-I70.0).  -Aortic Aneurysm  NOS (ICD10-I71.9).   [place] LLE - IMPRESSION: The exam is positive for superficial venous thrombosis in the greater saphenous vein throughout almost the entire thigh. No DVT is identified.    Procedures  Procedure(s) performed (including critical care):  .Critical Care Performed by: Lilia Pro., MD Authorized by: Lilia Pro., MD   Critical care provider statement:    Critical care time (minutes):  35   Critical care was time spent personally by me on the following activities:  Evaluation of patient's response to treatment, examination of patient, ordering and performing treatments and interventions, ordering and review of laboratory studies, ordering and review of radiographic studies, pulse oximetry, re-evaluation of patient's condition, obtaining history from patient or surrogate and review of old charts  .1-3 Lead EKG Interpretation Performed by: Lilia Pro., MD Authorized by: Lilia Pro., MD     Interpretation: abnormal     ECG rate assessment: tachycardic     Rhythm: atrial fibrillation    Comments:     Indication: SOB, AF Impression: flutter w/ RVR, HR slowly down trending on dilt drip     Initial Impression / Assessment and Plan / MDM / ED Course  59 y.o. male who presents to the ED for LLE thigh pain with a palpable cordlike abnormality, concerning for possible DVT.  Also complains of shortness of breath with exertion in the setting of recent PE diagnosed in March, as well as AF, having missed several days of his anticoagulation.  Found to be in AF with RVR on arrival.  Ddx: DVT, new/progressing PE, AF with RVR, symptomatic AF, anemia  Will plan for labs and imaging to include CT PE study as well as ultrasound of the LLE.  Will initiate empiric heparin given high clinical concern for VTE and the fact that he has been off of his anticoagulation.  Will initiate diltiazem drip for HR control.  Patient is agreeable.  CT imaging confirms pulmonary emboli in the right middle and right lower lobe, which are new.  Ultrasound reveals + superficial thrombosis of the greater saphenous vein throughout almost the entire thigh, but no DVT.  Will admit for heparinization and continued HR control.   _______________________________   As part of my medical decision making I have reviewed available labs, radiology tests, reviewed old records/performed chart review.    Final Clinical Impression(s) / ED Diagnosis  Pulmonary embolism Atrial flutter with RVR Shortness of breath Dyspnea on exertion     Note:  This document was prepared using Dragon voice recognition software and may include unintentional dictation errors.   Lilia Pro., MD 09/08/19 [phone]

## 2019-09-08 NOTE — Progress Notes (Signed)
ANTICOAGULATION CONSULT NOTE  Pharmacy Consult for heparin Indication: DVT  No Known Allergies  Patient Measurements: Height: 5\' 10"  (177.8 cm) Weight: 90.7 kg (200 lb) IBW/kg (Calculated) : 73 Heparin Dosing Weight: 90 kg  Vital Signs: Temp: 97.8 F (36.6 C) (06/24 1154) Temp Source: Oral (06/24 1154) BP: 145/93 (06/24 1230) Pulse Rate: 141 (06/24 1154)  Labs: Recent Labs    09/08/19 1158 09/08/19 1422  HGB 13.3  --   HCT 40.7  --   PLT 203  --   APTT 25  --   LABPROT 13.4  --   INR 1.1  --   HEPARINUNFRC <0.10*  --   CREATININE 0.92  --   TROPONINIHS 6 8    Estimated Creatinine Clearance: 100.4 mL/min (by C-G formula based on SCr of 0.92 mg/dL).   Medical History: Past Medical History:  Diagnosis Date  . Alcohol abuse   . Chronic pain   . COVID-19    a. 02/2019  . HFrEF (heart failure with reduced ejection fraction) ([place])    a. 05/2019 Echo: EF 35-40%.  Marland Kitchen NICM (nonischemic cardiomyopathy) ([place])    a. 05/2019 Echo: EF 35-40%, gr1 DD. Mod enlarged RV. Mildly dil LA. Mild to mod dil RA. Triv AI; b. 05/2019 MV: EF 32%, no ischemia.  . Persistent atrial fibrillation ([place])    a. Dx 05/2019 in setting of PE. CHA2DS2VASc = 1-->Eliquis later changed to xarelto; b. 07/2019 s/p DCCV (150J).  . Pulmonary embolism ([place])    a. 05/2019 CTA Chest: mild amt of PE w/in a lower lobe branch of RPA; b. 08/2019 CTA Chest: Small PE in RML and RLL PA.    Assessment: 59 year old male presented with SOB, pain and swelling in his left upper leg. Patient is on Xarelto PTA but has a h/o noncompliance. Patient reports not taking medications for the past week. CT chest positive for small PE in right middle and right lower lobe pulmonary arteries, new. Ultrasound left lower extremity positive for superficial venous thrombosis in the greater saphenous vein throughout almost the entire thigh. Pharmacy consulted for heparin drip.  Goal of Therapy:  Heparin level 0.3-0.7 units/ml Monitor  platelets by anticoagulation protocol: Yes   Plan:  Heparin 5000 unit bolus followed by heparin drip at 1450 units/hr. Check HL at 2000. Baseline HL < 0.10 so will dose based on HL. CBC daily while on heparin drip.  Tawnya Crook, PharmD 09/08/2019,3:57 PM

## 2019-09-08 NOTE — ED Triage Notes (Signed)
Pt to ED via POV c/o shortness of breath. Pt states that he has hx/o A.fib but he has not been taking his medication for the past week. Pt reports that his morning he started feeling short of breath. Pt is speaking in complete sentences at this time and no distress is noted. Pt also reports having a vein in his left upper leg that feels like a rope. Pt states that the area is swollen and has a knot in it, pt states that it feels like a rope. Pt reports that the area is painful. Pt reports feeling like his feet are going numb times, pt states that that has started over the past week. Pt also reports increased weight gain.   Pt just finished prednisone taper on Monday.

## 2019-09-08 NOTE — Consult Note (Signed)
Cardiology Consult    Patient ID: SAMMY CASSAR MRN: [id number], DOB/AGE: 08/14/1960   Admit date: 09/08/2019 Date of Consult: 09/08/2019  Primary Physician: [name], MD Primary Cardiologist: Ida Rogue, MD Requesting Provider: Jerilynn Mages. Mikhail, MD  Patient Profile    AHMAUD DUTHIE is a 59 y.o. male with a history of persistent atrial fibrillation status post recent cardioversion, alcohol abuse, chronic pain, nonischemic cardiomyopathy (EF 35-40% March 2021), pulmonary embolism (March 2021), and COVID-19 infection (02/2019) who is being seen today for the evaluation of recurrent A. fib in the setting of right middle and lower lobe pulmonary emboli (noncompliant w/ xarelto) at the request of Dr. Ree Kida.  Past Medical History   Past Medical History:  Diagnosis Date  . Alcohol abuse   . Chronic pain   . COVID-19    a. 02/2019  . HFrEF (heart failure with reduced ejection fraction) ([place])    a. 05/2019 Echo: EF 35-40%.  Marland Kitchen NICM (nonischemic cardiomyopathy) ([place])    a. 05/2019 Echo: EF 35-40%, gr1 DD. Mod enlarged RV. Mildly dil LA. Mild to mod dil RA. Triv AI; b. 05/2019 MV: EF 32%, no ischemia.  . Persistent atrial fibrillation ([place])    a. Dx 05/2019 in setting of PE. CHA2DS2VASc = 1-->Eliquis later changed to xarelto; b. 07/2019 s/p DCCV (150J).  . Pulmonary embolism ([place])    a. 05/2019 CTA Chest: mild amt of PE w/in a lower lobe branch of RPA; b. 08/2019 CTA Chest: Small PE in RML and RLL PA.    Past Surgical History:  Procedure Laterality Date  . CARDIOVERSION N/A 07/29/2019   Procedure: CARDIOVERSION;  Surgeon: Minna Merritts, MD;  Location: [facility] ORS;  Service: Cardiovascular;  Laterality: N/A;     Allergies  No Known Allergies  History of Present Illness    59 year old male with the above past medical history including persistent atrial fibrillation status post recent cardioversion, alcohol abuse, chronic pain, nonischemic cardiomyopathy, pulmonary embolism,  and COVID-19 infection.  He was previously diagnosed with atrial fibrillation in January 2020, when he presented with chest pain and dizziness and was found to be in A. fib.  He was placed on beta-blocker and Xarelto therapy and subsequently followed up with Dr. Lazarus Gowda in July 2020.  He was in sinus rhythm at that time.  He had run out of Xarelto and given a CHA2DS2-VASc of 0-1, it was not resumed.  Patient also stopped taking beta-blocker.  In December 2020, he was seen in the emergency department for flulike symptoms and was diagnosed with COVID-19.  He was in sinus rhythm at that time.  Beta-blocker and Eliquis therapy were represcribed but he did not seek refill after running out.  Following Covid recovery, he continued to have dyspnea on exertion and was admitted in late March due to worsening dyspnea on exertion as well as mild, constant chest tightness that was worse with deep breathing and position changes.  In the emergency department, he was found to be in A. fib with RVR and he was placed on IV diltiazem and heparin.  CT angiogram of the chest was performed and showed a mild amount of pulmonary embolism within a lower branch of the right pulmonary artery.  Echo showed an EF of 35-40% with grade 1 diastolic dysfunction and moderately enlarged right heart chambers.  Stress testing was undertaken and was nonischemic.  He was subsequently discharged on PE dosed Eliquis with an initial plan for rate control and A. fib clinic follow-up.  In the outpatient setting, he was switched from Eliquis to Xarelto in order to improve compliance.  He remained in atrial fibrillation at follow-up on May 10 and subsequently underwent successful cardioversion on May 14 (150 J).  Unfortunately, in the setting of progressive left heel pain and swelling, he was seen by podiatry on June 16 for gout flare and was placed on a prednisone taper.  He says that after taking his first dose of prednisone on June 18, he felt on edge and  also noted abdominal bloating, hunger, and other usual side effects of prednisone therapy.  As result of the way he felt, and knowing that he needed to continue on prednisone, he stopped taking all of his other medications.  Since then, he has also noticed left medial thigh pain that is worse with movement.  He has continued to work, Soil scientist, but has had difficulty because of severe left thigh pain.  Yesterday, he started feeling some shortness of breath but actually came to the ED today because of ongoing severe left medial thigh pain.  When walking into the emergency department, he noticed that he was very short of breath.  Upon arrival, he was found to be in atrial fibrillation with rapid ventricular response, at a rate of 140.  Left lower extremity ultrasound showed superficial venous thrombosis in the greater saphenous vein throughout almost the entire thigh.  No DVT was noted.  In light of this finding in addition to dyspnea, tachycardia, prior PE, and noncompliance with anticoagulation, CTA of the chest was performed and showed small pulmonary emboli in the right middle and lower lobe pulmonary arteries.  He has been placed on IV heparin and diltiazem.  He is currently in no acute distress.  Home medications      Prior to Admission medications   Medication Sig Start Date End Date Taking? Authorizing Provider  diltiazem (CARDIZEM CD) 360 MG 24 hr capsule Take 360 mg by mouth daily.   Yes [provider]  furosemide (LASIX) 20 MG tablet Take 1 tablet (20 mg total) by mouth daily as needed (As needed for leg swelling). Patient taking differently: Take 20 mg by mouth daily as needed for edema.  06/21/19 09/19/19 Yes Gollan, Kathlene November, MD  metoprolol succinate (TOPROL-XL) 100 MG 24 hr tablet Take 1 tablet (100 mg total) by mouth daily. Take with or immediately following a meal. Patient taking differently: Take 100 mg by mouth at bedtime.  06/21/19  Yes Minna Merritts, MD  metoprolol  succinate (TOPROL-XL) 50 MG 24 hr tablet Take 1 tablet (50 mg total) by mouth daily. Take with or immediately following a meal. Patient taking differently: Take 50 mg by mouth at bedtime.  06/21/19  Yes Minna Merritts, MD  predniSONE (DELTASONE) 10 MG tablet Take by mouth. 08/31/19  Yes [provider]  rivaroxaban (XARELTO) 20 MG TABS tablet Take 1 tablet (20 mg total) by mouth daily with supper. 06/21/19  Yes Minna Merritts, MD      Family History    Family History  Problem Relation Age of Onset  . Bone cancer Father   . Stroke Father   . Diabetes Sister    He indicated that his mother is alive. He indicated that his father is deceased. He indicated that both of his sisters are alive.   Social History    Social History   Socioeconomic History  . Marital status: Married    Spouse name: Not on file  . Number  of children: Not on file  . Years of education: Not on file  . Highest education level: Not on file  Occupational History  . Not on file  Tobacco Use  . Smoking status: Never Smoker  . Smokeless tobacco: Current User    Types: Chew  Vaping Use  . Vaping Use: Never used  Substance and Sexual Activity  . Alcohol use: Yes    Alcohol/week: 24.0 standard drinks    Types: 24 Cans of beer per week    Comment: 6 beers daily, weekend drinks more-  . Drug use: Not Currently  . Sexual activity: Not on file  Other Topics Concern  . Not on file  Social History Narrative  . Not on file   Social Determinants of Health   Financial Resource Strain:   . Difficulty of Paying Living Expenses:   Food Insecurity:   . Worried About Charity fundraiser in the Last Year:   . Arboriculturist in the Last Year:   Transportation Needs:   . Film/video editor (Medical):   Marland Kitchen Lack of Transportation (Non-Medical):   Physical Activity:   . Days of Exercise per Week:   . Minutes of Exercise per Session:   Stress:   . Feeling of Stress :   Social Connections:   .  Frequency of Communication with Friends and Family:   . Frequency of Social Gatherings with Friends and Family:   . Attends Religious Services:   . Active Member of Clubs or Organizations:   . Attends Archivist Meetings:   Marland Kitchen Marital Status:   Intimate Partner Violence:   . Fear of Current or Ex-Partner:   . Emotionally Abused:   Marland Kitchen Physically Abused:   . Sexually Abused:      Review of Systems    General:  No chills, fever, night sweats or weight changes.  Cardiovascular:  No chest pain, +++ dyspnea on exertion x 2 days, no edema, orthopnea, palpitations, paroxysmal nocturnal dyspnea. Dermatological: No rash, lesions/masses Respiratory: No cough, +++ dyspnea Urologic: No hematuria, dysuria Abdominal:   No nausea, vomiting, diarrhea, bright red blood per rectum, melena, or hematemesis Neurologic:  No visual changes, wkns, changes in mental status. MSK: Left medial thigh pain over the past week. All other systems reviewed and are otherwise negative except as noted above.  Physical Exam    Blood pressure (!) 145/93, pulse (!) 141, temperature 97.8 F (36.6 C), temperature source Oral, resp. rate 19, height 5\' 10"  (1.778 m), weight 90.7 kg, SpO2 97 %.  General: Pleasant, NAD Psych: Normal affect. Neuro: Alert and oriented X 3. Moves all extremities spontaneously. HEENT: Normal  Neck: Supple without bruits or JVD. Lungs:  Resp regular and unlabored, CTA. Heart: Irregularly, irregular, tachycardic, no s3, s4, or murmurs. Abdomen: Soft, non-tender, non-distended, BS + x 4.  Extremities: No clubbing, cyanosis or edema. DP/PT/Radials 2+ and equal bilaterally.  Labs    Cardiac Enzymes Recent Labs  Lab 09/08/19 1158 09/08/19 1422  TROPONINIHS 6 8      Lab Results  Component Value Date   WBC 7.4 09/08/2019   HGB 13.3 09/08/2019   HCT 40.7 09/08/2019   MCV 96.0 09/08/2019   PLT 203 09/08/2019    Recent Labs  Lab 09/08/19 1158  NA 137  K 4.4  CL 105  CO2 22    BUN 18  CREATININE 0.92  CALCIUM 8.8*  GLUCOSE 116*    Radiology Studies    DG Chest  2 View  Result Date: 09/08/2019 CLINICAL DATA:  Shortness of breath. EXAM: CHEST - 2 VIEW COMPARISON:  06/07/2027 FINDINGS: The heart is in the range in size. Slight pulmonary vascular prominence. Tiny left pleural effusion is new. No infiltrates. No acute bone abnormality. IMPRESSION: New slight pulmonary vascular prominence and tiny left pleural effusion. Electronically Signed   By: Lorriane Shire M.D.   On: 09/08/2019 12:32   CT Angio Chest PE W/Cm &/Or Wo Cm  Result Date: 09/08/2019 CLINICAL DATA:  Shortness of breath today, LEFT thigh pain for 1 week, past history of pulmonary embolism no longer on anticoagulation EXAM: CT ANGIOGRAPHY CHEST WITH CONTRAST TECHNIQUE: Multidetector CT imaging of the chest was performed using the standard protocol during bolus administration of intravenous contrast. Multiplanar CT image reconstructions and MIPs were obtained to evaluate the vascular anatomy. CONTRAST:  47mL OMNIPAQUE IOHEXOL 350 MG/ML SOLN IV COMPARISON:  06/07/2019 FINDINGS: Cardiovascular: Minimal aneurysmal dilatation ascending thoracic aorta 4.0 cm transverse. Enlargement of cardiac chambers. No pericardial effusion. Enlarged central pulmonary arteries, main pulmonary artery 3.6 cm transverse RIGHT LEFT pulmonary arteries 2.8 cm transverse. Pulmonary arteries well opacified. Small filling defects identified in RIGHT middle lobe and RIGHT lower lobe pulmonary arteries consistent with pulmonary emboli new since prior study. Minimal atherosclerotic calcification aorta. Mediastinum/Nodes: Esophagus unremarkable. Base of cervical region normal appearance. No thoracic adenopathy. Lungs/Pleura: Lungs clear. No infiltrate, pleural effusion, pneumothorax or mass. Upper Abdomen: Unremarkable Musculoskeletal: Unremarkable Review of the MIP images confirms the above findings. IMPRESSION: Small pulmonary emboli in RIGHT  middle and RIGHT lower lobe pulmonary arteries, new. Enlarged central pulmonary arteries which may reflect pulmonary arterial hypertension. Minimal aneurysmal dilatation of ascending thoracic aorta 4.0 cm transverse; recommend annual imaging followup by CTA or MRA. This recommendation follows the 2010 ACCF/AHA/AATS/ACR/ASA/SCA/SCAI/SIR/STS/SVM Guidelines for the Diagnosis and Management of Patients with Thoracic Aortic Disease. Circulation. 2010; 121: U235-T614. Aortic aneurysm NOS (ICD10-I71.9) Aortic Atherosclerosis (ICD10-I70.0). Aortic Aneurysm NOS (ICD10-I71.9). Critical Value/emergent results were called by telephone at the time of interpretation on 09/08/2019 at 1:44 pm to provider Dr. Quentin Cornwall, who verbally acknowledged these results. Electronically Signed   By: Lavonia Dana M.D.   On: 09/08/2019 13:46   US Venous Img Lower Unilateral Left (DVT)  Result Date: 09/08/2019 CLINICAL DATA:  Left leg swelling for 1 day.  Shortness of breath. EXAM: RIGHT LOWER EXTREMITY VENOUS DOPPLER ULTRASOUND TECHNIQUE: Gray-scale sonography with compression, as well as color and duplex ultrasound, were performed to evaluate the deep venous system(s) from the level of the common femoral vein through the popliteal and proximal calf veins. COMPARISON:  None. FINDINGS: VENOUS Normal compressibility of the common femoral, superficial femoral, and popliteal veins. Visualized portions of profunda femoral vein and great saphenous vein unremarkable. No filling defects to suggest DVT on grayscale or color Doppler imaging. Doppler waveforms show normal direction of venous flow, normal respiratory plasticity and response to augmentation. Thrombus is identified in the greater saphenous vein throughout almost the entire thigh. Limited views of the contralateral common femoral vein are unremarkable. OTHER None. Limitations: none IMPRESSION: The exam is positive for superficial venous thrombosis in the greater saphenous vein throughout  almost the entire thigh. No DVT is identified. Electronically Signed   By: Inge Rise M.D.   On: 09/08/2019 14:00    ECG & Cardiac Imaging    Coarse atrial fibrillation versus flutter, 140, no acute ST or T changes- personally reviewed.  Assessment & Plan    1.  Right middle and lower lobe PE/superficial venous  thrombosis of the left greater saphenous vein: Patient with prior history of mild PE within the lower branch of the right pulmonary artery in March, at which time he was placed on PE dosing of Eliquis.  Persistent atrial fibrillation also noted at that time.  Unfortunately, he has been noncompliant with oral anticoagulation, stating that he came off of all of his medicines with the exception of a prednisone taper on June 18.  Over the past week he has been having left medial thigh pain and over the past 2 days, developed dyspnea on exertion.  On arrival to the ED today, he was again found to be in A. fib with RVR.  Lower extremity ultrasound has shown a superficial venous thrombosis of the left greater saphenous vein.  CT of the chest shows new, small pulmonary emboli involving the right middle and right lower lobe pulmonary arteries.  He is currently on heparin.  Eventual plan to transition back to oral anticoagulation.  As he has been noncompliant up to this point, this does not represent a Xarelto failure.  We discussed the importance of compliance both in the setting of PE and recent cardioversion, and now need for repeat.  2.  Persistent atrial fibrillation with rapid ventricular response: Initially diagnosed in March in the setting of PE.  He is status post cardioversion on May 14, which was successful.  As above, he has been off of his metoprolol, diltiazem, and Xarelto since June 18.  We discussed the importance of compliance with medications.  He does check his heart rate/blood pressure at home and says earlier this week his heart rate was 77.  He denies palpitations.  He is  currently on heparin and diltiazem infusion.  We will resume oral beta-blocker and suspect we can wean from IV diltiazem.  In the setting of cardiomyopathy, we prefer to avoid diltiazem however, it has been felt that rapid rates are likely driving cardiomyopathy and he did require oral diltiazem as an outpatient to improve heart rates while in A. fib.  Plan to resume oral anticoagulation prior to discharge.  We will plan to rate control for 4 weeks and pursue repeat cardioversion at that time.  3.  Nonischemic cardiomyopathy/HFrEF: He has noted increasing dyspnea over the past 2 days however, this has occurred in the setting of right middle and lower lobe PE.  Euvolemic on examination.  Weight was stable post discharge the reported weight today is up about 4 pounds.  We will have to follow during hospitalization and potentially adjust diuretics.  Resume beta-blocker.  He was previously on losartan as well, but this fell off of his med list between his 4/6 and 5/10 clinic visit.  Can look to resume if bp stable.  Will need f/u echo in a few months after repeat DCCV, to reassess EF.  4. Alcohol abuse:  Cessation advised.  Certainly contributing to Afib and cardiomyopathy.  Signed, Murray Hodgkins, NP 09/08/2019, 3:38 PM  For questions or updates, please contact   Please consult [email or web address] for contact info under Cardiology/STEMI.

## 2019-09-09 ENCOUNTER — Ambulatory Visit: Payer: 59 | Admitting: Nurse Practitioner

## 2019-09-09 DIAGNOSIS — I4819 Other persistent atrial fibrillation: Secondary | ICD-10-CM

## 2019-09-09 DIAGNOSIS — I5023 Acute on chronic systolic (congestive) heart failure: Secondary | ICD-10-CM

## 2019-09-09 DIAGNOSIS — I2609 Other pulmonary embolism with acute cor pulmonale: Secondary | ICD-10-CM

## 2019-09-09 LAB — CBC
HCT: 38.4 % — ABNORMAL LOW (ref 39.0–52.0)
HCT: 42.1 % (ref 39.0–52.0)
Hemoglobin: 12.6 g/dL — ABNORMAL LOW (ref 13.0–17.0)
Hemoglobin: 14.1 g/dL (ref 13.0–17.0)
MCH: 32 pg (ref 26.0–34.0)
MCH: 32.1 pg (ref 26.0–34.0)
MCHC: 32.8 g/dL (ref 30.0–36.0)
MCHC: 33.5 g/dL (ref 30.0–36.0)
MCV: 97.5 fL (ref 80.0–100.0)
MCV: 95.9 fL (ref 80.0–100.0)
Platelets: 195 10*3/uL (ref 150–400)
Platelets: 212 10*3/uL (ref 150–400)
RBC: 3.94 MIL/uL — ABNORMAL LOW (ref 4.22–5.81)
RBC: 4.39 MIL/uL (ref 4.22–5.81)
RDW: 14.6 % (ref 11.5–15.5)
RDW: 14.5 % (ref 11.5–15.5)
WBC: 6.6 10*3/uL (ref 4.0–10.5)
WBC: 6.4 10*3/uL (ref 4.0–10.5)
nRBC: 0 % (ref 0.0–0.2)
nRBC: 0 % (ref 0.0–0.2)

## 2019-09-09 LAB — COMPREHENSIVE METABOLIC PANEL
ALT: 30 U/L (ref 0–44)
AST: 21 U/L (ref 15–41)
Albumin: 3.6 g/dL (ref 3.5–5.0)
Alkaline Phosphatase: 54 U/L (ref 38–126)
Anion gap: 11 (ref 5–15)
BUN: 13 mg/dL (ref 6–20)
CO2: 26 mmol/L (ref 22–32)
Calcium: 9.2 mg/dL (ref 8.9–10.3)
Chloride: 100 mmol/L (ref 98–111)
Creatinine, Ser: 1.03 mg/dL (ref 0.61–1.24)
GFR calc Af Amer: >60 mL/min (ref >60)
GFR calc non Af Amer: >60 mL/min (ref >60)
Glucose, Bld: 111 mg/dL — ABNORMAL HIGH (ref 70–99)
Potassium: 4.1 mmol/L (ref 3.5–5.1)
Sodium: 137 mmol/L (ref 135–145)
Total Bilirubin: 1.1 mg/dL (ref 0.3–1.2)
Total Protein: 7 g/dL (ref 6.5–8.1)

## 2019-09-09 LAB — BASIC METABOLIC PANEL
Anion gap: 9 (ref 5–15)
BUN: 17 mg/dL (ref 6–20)
CO2: 23 mmol/L (ref 22–32)
Calcium: 8.5 mg/dL — ABNORMAL LOW (ref 8.9–10.3)
Chloride: 106 mmol/L (ref 98–111)
Creatinine, Ser: 0.9 mg/dL (ref 0.61–1.24)
GFR calc Af Amer: >60 mL/min (ref >60)
GFR calc non Af Amer: >60 mL/min (ref >60)
Glucose, Bld: 130 mg/dL — ABNORMAL HIGH (ref 70–99)
Potassium: 3.7 mmol/L (ref 3.5–5.1)
Sodium: 138 mmol/L (ref 135–145)

## 2019-09-09 LAB — HEPARIN LEVEL (UNFRACTIONATED)
Heparin Unfractionated: 0.29 IU/mL — ABNORMAL LOW (ref 0.30–0.70)
Heparin Unfractionated: 0.56 IU/mL (ref 0.30–0.70)
Heparin Unfractionated: 0.71 IU/mL — ABNORMAL HIGH (ref 0.30–0.70)
Heparin Unfractionated: 0.76 IU/mL — ABNORMAL HIGH (ref 0.30–0.70)

## 2019-09-09 LAB — TYPE AND SCREEN
ABO/RH(D): A POS
Antibody Screen: NEGATIVE

## 2019-09-09 LAB — PHOSPHORUS: Phosphorus: 3.8 mg/dL (ref 2.5–4.6)

## 2019-09-09 LAB — MAGNESIUM: Magnesium: 2.2 mg/dL (ref 1.7–2.4)

## 2019-09-09 MED ORDER — LORAZEPAM 2 MG/ML IJ SOLN: 1.0000 mg | INTRAMUSCULAR | Status: DC | PRN

## 2019-09-09 MED ORDER — LORAZEPAM 1 MG PO TABS
1.0000 mg | ORAL_TABLET | ORAL | Status: DC | PRN
  Administered 2019-09-09: 2 mg via ORAL
  Filled 2019-09-09: qty 2

## 2019-09-09 MED ORDER — THIAMINE HCL 100 MG/ML IJ SOLN
100.0000 mg | Freq: Every day | INTRAMUSCULAR | Status: DC
  Filled 2019-09-09: qty 2

## 2019-09-09 MED ORDER — SALINE SPRAY 0.65 % NA SOLN
1.0000 | NASAL | Status: DC | PRN
  Filled 2019-09-09: qty 44

## 2019-09-09 MED ORDER — THIAMINE HCL 100 MG PO TABS
100.0000 mg | ORAL_TABLET | Freq: Every day | ORAL | Status: DC
  Administered 2019-09-09 – 2019-09-10 (×2): 100 mg via ORAL
  Filled 2019-09-09 (×2): qty 1

## 2019-09-09 MED ORDER — FUROSEMIDE 20 MG PO TABS
20.0000 mg | ORAL_TABLET | Freq: Every day | ORAL | Status: DC
  Administered 2019-09-09 – 2019-09-10 (×2): 20 mg via ORAL
  Filled 2019-09-09 (×2): qty 1

## 2019-09-09 MED ORDER — POTASSIUM CHLORIDE CRYS ER 20 MEQ PO TBCR
40.0000 meq | EXTENDED_RELEASE_TABLET | Freq: Once | ORAL | Status: AC
  Administered 2019-09-09: 40 meq via ORAL
  Filled 2019-09-09: qty 2

## 2019-09-09 MED ORDER — ADULT MULTIVITAMIN W/MINERALS CH
1.0000 | ORAL_TABLET | Freq: Every day | ORAL | Status: DC
  Administered 2019-09-09 – 2019-09-10 (×2): 1 via ORAL
  Filled 2019-09-09 (×2): qty 1

## 2019-09-09 MED ORDER — FUROSEMIDE 10 MG/ML IJ SOLN
40.0000 mg | Freq: Once | INTRAMUSCULAR | Status: AC
  Administered 2019-09-09: 40 mg via INTRAVENOUS
  Filled 2019-09-09: qty 4

## 2019-09-09 MED ORDER — FOLIC ACID 1 MG PO TABS
1.0000 mg | ORAL_TABLET | Freq: Every day | ORAL | Status: DC
  Administered 2019-09-09 – 2019-09-10 (×2): 1 mg via ORAL
  Filled 2019-09-09 (×2): qty 1

## 2019-09-09 NOTE — Progress Notes (Signed)
ANTICOAGULATION CONSULT NOTE  Pharmacy Consult for heparin Indication: DVT  No Known Allergies  Patient Measurements: Height: 5\' 10"  (177.8 cm) Weight: 89.7 kg (197 lb 12.8 oz) IBW/kg (Calculated) : 73 Heparin Dosing Weight: 90 kg  Vital Signs: Temp: 99.2 F (37.3 C) (06/24 2157) BP: 121/91 (06/25 0001) Pulse Rate: 95 (06/24 2157)  Labs: Recent Labs    09/08/19 0940 09/08/19 1158 09/08/19 1422 09/09/19 0029  HGB  --  13.3  --  12.6*  HCT  --  40.7  --  38.4*  PLT  --  203  --  195  APTT  --  25  --   --   LABPROT  --  13.4  --   --   INR  --  1.1  --   --   HEPARINUNFRC 2.28* <0.10*  --  0.76*  CREATININE  --  0.92  --   --   TROPONINIHS  --  6 8  --     Estimated Creatinine Clearance: 99.9 mL/min (by C-G formula based on SCr of 0.92 mg/dL).   Medical History: Past Medical History:  Diagnosis Date  . Alcohol abuse   . Chronic pain   . COVID-19    a. 02/2019  . HFrEF (heart failure with reduced ejection fraction) ([place])    a. 05/2019 Echo: EF 35-40%.  Marland Kitchen NICM (nonischemic cardiomyopathy) ([place])    a. 05/2019 Echo: EF 35-40%, gr1 DD. Mod enlarged RV. Mildly dil LA. Mild to mod dil RA. Triv AI; b. 05/2019 MV: EF 32%, no ischemia.  . Persistent atrial fibrillation ([place])    a. Dx 05/2019 in setting of PE. CHA2DS2VASc = 1-->Eliquis later changed to xarelto; b. 07/2019 s/p DCCV (150J).  . Pulmonary embolism ([place])    a. 05/2019 CTA Chest: mild amt of PE w/in a lower lobe branch of RPA; b. 08/2019 CTA Chest: Small PE in RML and RLL PA.   Assessment: 59 year old male presented with SOB, pain and swelling in his left upper leg. Patient is on Xarelto PTA but has a h/o noncompliance. Patient reports not taking medications for the past week. CT chest positive for small PE in right middle and right lower lobe pulmonary arteries, new. Ultrasound left lower extremity positive for superficial venous thrombosis in the greater saphenous vein throughout almost the entire thigh. Pharmacy  consulted for heparin drip.  0625 0029 HL 0.76, SUPRAtherapeutic (of note there was an issue w/ previous level drawn in ED therefore lab was redrawn)  Goal of Therapy:  Heparin level 0.3-0.7 units/ml Monitor platelets by anticoagulation protocol: Yes   Plan:  Decrease heparin drip to 1350 units/hr.  Check HL 6 hours after rate reduced.   Baseline HL < 0.10 so will dose based on HL. CBC daily while on heparin drip.  Calvin Esparza, PharmD 09/09/2019,1:16 AM

## 2019-09-09 NOTE — Progress Notes (Signed)
PROGRESS NOTE    Calvin Esparza  RKY:[id number] DOB: [birth year] DOA: 09/08/2019 PCP: [name], MD   Brief Narrative:  HPI On 09/08/2019  Calvin Esparza is a 60 y.o. male with a medical history of CHF, COVID-19 in December 2020, atrial fibrillation, pulmonary embolism, presented to the hospital with complaints of shortness of breath and leg pain.  Patient states that his shortness of breath is been on and off since having Covid however has worsened over the last week.  Also started having left lower extremity pain.  Patient admits to not taking his Xarelto and other medications for the past week as he was placed on prednisone for foot pain and this made him "loopy".  Currently patient denies chest pain, dizziness or headache, abdominal pain, nausea or vomiting, diarrhea or constipation, changes in bowel or urinary habits, recent illness or travel.  Assessment & Plan   Atrial fibrillation/flutter with RVR-persistent -Patient was supposed to be taking Xarelto as an outpatient however did not take any over the past week -Presented with heart rates in the 140s -Diltiazem drip along with heparin -Had cardioversion approximately 1 month ago -Cardiology consulted and appreciated-patient may need repeat version in 3 to 4 weeks -Continue metoprolol, currently on diltiazem 5 mg per hour  Acute PE/superficial thrombosis lower extremity -Patient was noted to have acute PE back in March 2021 and was placed on Xarelto-as above, patient has not taken his medication in over a week -CTA chest showed small pulmonary emboli in the right middle and right lower lobe pulmonary arteries, new.  Enlarged central pulmonary arteries which may reflect pulmonary arterial hypertension. -Left lower extremity ultrasound positive for superficial venous thrombosis in the greater saphenous vein throughout almost the entire thigh -have asked for home O2/desat eval -Eventually will transition patient from  heparin to Xarelto  Dyspnea -Patient was Covid positive back in December 2020 -Suspect multifactorial including atrial fibrillation with RVR as well as acute PE -Continue to treat underlying etiology -worse with lying flat -Does not appear to be volume overloaded however will change his Lasix from as needed to scheduled daily  Ascending thoracic aortic aneurysm -Noted on CT chest -Aneurysmal dilatation of a sending thoracic aorta at 4 cm transverse -Patient will need follow-up imaging yearly  Chronic systolic/diastolic heart failure/nonischemic cardiomyopathy -Echocardiogram 06/08/2019 showed an EF of 35 to 31%, grade 1 diastolic dysfunction -Continue Lasix-change from as needed to 20 mg scheduled daily -Patient currently appears to be euvolemic and compensated -Monitor intake and output, daily weights  Alcohol abuse -Will place on CIWA -Currently no withdrawal symptoms -Discussed cessation  DVT Prophylaxis  heparin  Code Status: Full  Family Communication: None at bedside  Disposition Plan:  Status is: Inpatient  Remains inpatient appropriate because:IV treatments appropriate due to intensity of illness or inability to take PO   Dispo: The patient is from: Home              Anticipated d/c is to: Home              Anticipated d/c date is: 1 day              Patient currently is not medically stable to d/c.  Consultants Cardiology  Procedures  Left LE doppler  Antibiotics   Anti-infectives (From admission, onward)   None      Subjective:   Calvin Esparza seen and examined today.  Complains of shortness of breath when lying flat.  Has not been out of bed  yet so does not know whether his left leg still hurts or his shortness of breath will be worse with movement.  He denies current chest pain, abdominal pain, nausea or vomiting, dizziness or headache.  Objective:   Vitals:   09/09/19 0208 09/09/19 0443 09/09/19 0656 09/09/19 0729  BP: (!) 123/96 (!) 136/97  (!) 138/101 (!) 137/91  Pulse:  81 92 79  Resp:    19  Temp:  99.3 F (37.4 C)  98.4 F (36.9 C)  TempSrc:  Oral    SpO2:  96%  92%  Weight:  91.5 kg    Height:        Intake/Output Summary (Last 24 hours) at 09/09/2019 1046 Last data filed at 09/09/2019 0950 Gross per 24 hour  Intake 240 ml  Output 310 ml  Net -70 ml   Filed Weights   09/08/19 1152 09/08/19 2157 09/09/19 0443  Weight: 90.7 kg 89.7 kg 91.5 kg    Exam  General: Well developed, well nourished, NAD, appears stated age  80: NCAT, mucous membranes moist.   Cardiovascular: S1 S2 auscultated, irregular  Respiratory: Clear to auscultation bilaterally with equal chest rise  Abdomen: Soft, nontender, nondistended, + bowel sounds  Extremities: warm dry without cyanosis clubbing or edema.  Bruising on left foot.  Left thigh TTP  Neuro: AAOx3, nonfocal  Psych: Pleasant, appropriate mood and affect   Data Reviewed: I have personally reviewed following labs and imaging studies  CBC: Recent Labs  Lab 09/08/19 1158 09/09/19 0029  WBC 7.4 6.6  HGB 13.3 12.6*  HCT 40.7 38.4*  MCV 96.0 97.5  PLT 203 762   Basic Metabolic Panel: Recent Labs  Lab 09/08/19 1158 09/09/19 0029  NA 137 138  K 4.4 3.7  CL 105 106  CO2 22 23  GLUCOSE 116* 130*  BUN 18 17  CREATININE 0.92 0.90  CALCIUM 8.8* 8.5*   GFR: Estimated Creatinine Clearance: 103 mL/min (by C-G formula based on SCr of 0.9 mg/dL). Liver Function Tests: No results for input(s): AST, ALT, ALKPHOS, BILITOT, PROT, ALBUMIN in the last 168 hours. No results for input(s): LIPASE, AMYLASE in the last 168 hours. No results for input(s): AMMONIA in the last 168 hours. Coagulation Profile: Recent Labs  Lab 09/08/19 1158  INR 1.1   Cardiac Enzymes: No results for input(s): CKTOTAL, CKMB, CKMBINDEX, TROPONINI in the last 168 hours. BNP (last 3 results) No results for input(s): PROBNP in the last 8760 hours. HbA1C: No results for input(s): HGBA1C  in the last 72 hours. CBG: No results for input(s): GLUCAP in the last 168 hours. Lipid Profile: No results for input(s): CHOL, HDL, LDLCALC, TRIG, CHOLHDL, LDLDIRECT in the last 72 hours. Thyroid Function Tests: No results for input(s): TSH, T4TOTAL, FREET4, T3FREE, THYROIDAB in the last 72 hours. Anemia Panel: No results for input(s): VITAMINB12, FOLATE, FERRITIN, TIBC, IRON, RETICCTPCT in the last 72 hours. Urine analysis: No results found for: COLORURINE, APPEARANCEUR, LABSPEC, PHURINE, GLUCOSEU, HGBUR, BILIRUBINUR, KETONESUR, PROTEINUR, UROBILINOGEN, NITRITE, LEUKOCYTESUR Sepsis Labs: @LABRCNTIP (procalcitonin:4,lacticidven:4)  ) Recent Results (from the past 240 hour(s))  SARS Coronavirus 2 by RT PCR (hospital order, performed in [facility] hospital lab) Nasopharyngeal Nasopharyngeal Swab     Status: None   Collection Time: 09/08/19  2:22 PM   Specimen: Nasopharyngeal Swab  Result Value Ref Range Status   SARS Coronavirus 2 NEGATIVE NEGATIVE Final    Comment: (NOTE) SARS-CoV-2 target nucleic acids are NOT DETECTED.  The SARS-CoV-2 RNA is generally detectable in upper and  lower respiratory specimens during the acute phase of infection. The lowest concentration of SARS-CoV-2 viral copies this assay can detect is 250 copies / mL. A negative result does not preclude SARS-CoV-2 infection and should not be used as the sole basis for treatment or other patient management decisions.  A negative result may occur with improper specimen collection / handling, submission of specimen other than nasopharyngeal swab, presence of viral mutation(s) within the areas targeted by this assay, and inadequate number of viral copies (<250 copies / mL). A negative result must be combined with clinical observations, patient history, and epidemiological information.  Fact Sheet for Patients:   StrictlyIdeas.no  Fact Sheet for Healthcare  Providers: BankingDealers.co.za  This test is not yet approved or  cleared by the [place] FDA and has been authorized for detection and/or diagnosis of SARS-CoV-2 by FDA under an Emergency Use Authorization (EUA).  This EUA will remain in effect (meaning this test can be used) for the duration of the COVID-19 declaration under Section 564(b)(1) of the Act, 21 U.S.C. section 360bbb-3(b)(1), unless the authorization is terminated or revoked sooner.  Performed at [facility], [address] [place], [place] [address]       Radiology Studies: DG Chest 2 View  Result Date: 09/08/2019 CLINICAL DATA:  Shortness of breath. EXAM: CHEST - 2 VIEW COMPARISON:  06/07/2027 FINDINGS: The heart is in the range in size. Slight pulmonary vascular prominence. Tiny left pleural effusion is new. No infiltrates. No acute bone abnormality. IMPRESSION: New slight pulmonary vascular prominence and tiny left pleural effusion. Electronically Signed   By: Lorriane Shire M.D.   On: 09/08/2019 12:32   CT Angio Chest PE W/Cm &/Or Wo Cm  Result Date: 09/08/2019 CLINICAL DATA:  Shortness of breath today, LEFT thigh pain for 1 week, past history of pulmonary embolism no longer on anticoagulation EXAM: CT ANGIOGRAPHY CHEST WITH CONTRAST TECHNIQUE: Multidetector CT imaging of the chest was performed using the standard protocol during bolus administration of intravenous contrast. Multiplanar CT image reconstructions and MIPs were obtained to evaluate the vascular anatomy. CONTRAST:  15mL OMNIPAQUE IOHEXOL 350 MG/ML SOLN IV COMPARISON:  06/07/2019 FINDINGS: Cardiovascular: Minimal aneurysmal dilatation ascending thoracic aorta 4.0 cm transverse. Enlargement of cardiac chambers. No pericardial effusion. Enlarged central pulmonary arteries, main pulmonary artery 3.6 cm transverse RIGHT LEFT pulmonary arteries 2.8 cm transverse. Pulmonary arteries well opacified. Small filling defects  identified in RIGHT middle lobe and RIGHT lower lobe pulmonary arteries consistent with pulmonary emboli new since prior study. Minimal atherosclerotic calcification aorta. Mediastinum/Nodes: Esophagus unremarkable. Base of cervical region normal appearance. No thoracic adenopathy. Lungs/Pleura: Lungs clear. No infiltrate, pleural effusion, pneumothorax or mass. Upper Abdomen: Unremarkable Musculoskeletal: Unremarkable Review of the MIP images confirms the above findings. IMPRESSION: Small pulmonary emboli in RIGHT middle and RIGHT lower lobe pulmonary arteries, new. Enlarged central pulmonary arteries which may reflect pulmonary arterial hypertension. Minimal aneurysmal dilatation of ascending thoracic aorta 4.0 cm transverse; recommend annual imaging followup by CTA or MRA. This recommendation follows the 2010 ACCF/AHA/AATS/ACR/ASA/SCA/SCAI/SIR/STS/SVM Guidelines for the Diagnosis and Management of Patients with Thoracic Aortic Disease. Circulation. 2010; 121: M546-T035. Aortic aneurysm NOS (ICD10-I71.9) Aortic Atherosclerosis (ICD10-I70.0). Aortic Aneurysm NOS (ICD10-I71.9). Critical Value/emergent results were called by telephone at the time of interpretation on 09/08/2019 at 1:44 pm to provider Dr. Quentin Cornwall, who verbally acknowledged these results. Electronically Signed   By: Lavonia Dana M.D.   On: 09/08/2019 13:46   US Venous Img Lower Unilateral Left (DVT)  Result Date: 09/08/2019 CLINICAL  DATA:  Left leg swelling for 1 day.  Shortness of breath. EXAM: RIGHT LOWER EXTREMITY VENOUS DOPPLER ULTRASOUND TECHNIQUE: Gray-scale sonography with compression, as well as color and duplex ultrasound, were performed to evaluate the deep venous system(s) from the level of the common femoral vein through the popliteal and proximal calf veins. COMPARISON:  None. FINDINGS: VENOUS Normal compressibility of the common femoral, superficial femoral, and popliteal veins. Visualized portions of profunda femoral vein and great  saphenous vein unremarkable. No filling defects to suggest DVT on grayscale or color Doppler imaging. Doppler waveforms show normal direction of venous flow, normal respiratory plasticity and response to augmentation. Thrombus is identified in the greater saphenous vein throughout almost the entire thigh. Limited views of the contralateral common femoral vein are unremarkable. OTHER None. Limitations: none IMPRESSION: The exam is positive for superficial venous thrombosis in the greater saphenous vein throughout almost the entire thigh. No DVT is identified. Electronically Signed   By: Inge Rise M.D.   On: 09/08/2019 14:00     Scheduled Meds: . furosemide  20 mg Oral Daily  . metoprolol succinate  100 mg Oral Daily  . metoprolol succinate  50 mg Oral QHS   Continuous Infusions: . diltiazem (CARDIZEM) infusion 5 mg/hr (09/09/19 0207)  . heparin 1,200 Units/hr (09/09/19 1014)     LOS: 1 day   Time Spent in minutes   45 minutes  Lachrisha Ziebarth D.O. on 09/09/2019 at 10:46 AM  Between 7am to 7pm - Please see pager noted on [email or web address]  After 7pm go to [email or web address]  And look for the night coverage person covering for me after hours  [facility] Office  [phone]

## 2019-09-09 NOTE — Progress Notes (Signed)
ANTICOAGULATION CONSULT NOTE  Pharmacy Consult for heparin Indication: atrial fibrillation and DVT  No Known Allergies  Patient Measurements: Height: 5\' 10"  (177.8 cm) Weight: 91.5 kg (201 lb 12.8 oz) IBW/kg (Calculated) : 73 Heparin Dosing Weight: 90 kg  Vital Signs: Temp: 98.4 F (36.9 C) (06/25 1237) Temp Source: Oral (06/25 0443) BP: 125/91 (06/25 1237) Pulse Rate: 97 (06/25 1237)  Labs: Recent Labs    09/08/19 1158 09/08/19 1158 09/08/19 1422 09/09/19 0029 09/09/19 0820 09/09/19 1504  HGB 13.3   < >  --  12.6*  --  14.1  HCT 40.7  --   --  38.4*  --  42.1  PLT 203  --   --  195  --  212  APTT 25  --   --   --   --   --   LABPROT 13.4  --   --   --   --   --   INR 1.1  --   --   --   --   --   HEPARINUNFRC <0.10*   < >  --  0.76* 0.71* 0.29*  CREATININE 0.92  --   --  0.90  --  1.03  TROPONINIHS 6  --  8  --   --   --    < > = values in this interval not displayed.    Estimated Creatinine Clearance: 90 mL/min (by C-G formula based on SCr of 1.03 mg/dL).   Medical History: Past Medical History:  Diagnosis Date  . Alcohol abuse   . Chronic pain   . COVID-19    a. 02/2019  . HFrEF (heart failure with reduced ejection fraction) ([place])    a. 05/2019 Echo: EF 35-40%.  Marland Kitchen NICM (nonischemic cardiomyopathy) ([place])    a. 05/2019 Echo: EF 35-40%, gr1 DD. Mod enlarged RV. Mildly dil LA. Mild to mod dil RA. Triv AI; b. 05/2019 MV: EF 32%, no ischemia.  . Persistent atrial fibrillation ([place])    a. Dx 05/2019 in setting of PE. CHA2DS2VASc = 1-->Eliquis later changed to xarelto; b. 07/2019 s/p DCCV (150J).  . Pulmonary embolism ([place])    a. 05/2019 CTA Chest: mild amt of PE w/in a lower lobe branch of RPA; b. 08/2019 CTA Chest: Small PE in RML and RLL PA.   Assessment: 59 year old male presented with SOB, pain and swelling in his left upper leg. Patient is on Xarelto PTA but has a h/o noncompliance. Patient reports not taking medications for the past week. CT chest positive for  small PE in right middle and right lower lobe pulmonary arteries, new. Ultrasound left lower extremity positive for superficial venous thrombosis in the greater saphenous vein throughout almost the entire thigh. Pharmacy consulted for heparin drip.  6/25 0029 HL 0.76, SUPRAtherapeutic (of note there was an issue w/ previous level drawn in ED therefore lab was redrawn)  6/25 0820 HL 0.71 - decrease heparin drip to 1200 units/hr.  6/25 1504 HL 0.29  Goal of Therapy:  Heparin level 0.3-0.7 units/ml Monitor platelets by anticoagulation protocol: Yes   Plan:  Heparin level is slightly subtherapeutic. Heparin currently running at 12 ml/hr per RN; no line issues or s/sx of bleeding.  Will increase heparin drip to 1250 units/hr. Check heparin level in 6 hours. CBC daily while on heparin.   Pharmacy will continue to follow.   Rocky Morel, PharmD, BCPS 09/09/2019,4:11 PM

## 2019-09-09 NOTE — Progress Notes (Signed)
ANTICOAGULATION CONSULT NOTE  Pharmacy Consult for heparin Indication: atrial fibrillation and DVT  No Known Allergies  Patient Measurements: Height: 5\' 10"  (177.8 cm) Weight: 91.5 kg (201 lb 12.8 oz) IBW/kg (Calculated) : 73 Heparin Dosing Weight: 90 kg  Vital Signs: Temp: 98.4 F (36.9 C) (06/25 0729) Temp Source: Oral (06/25 0443) BP: 137/91 (06/25 0729) Pulse Rate: 79 (06/25 0729)  Labs: Recent Labs    09/08/19 1158 09/08/19 1422 09/09/19 0029 09/09/19 0820  HGB 13.3  --  12.6*  --   HCT 40.7  --  38.4*  --   PLT 203  --  195  --   APTT 25  --   --   --   LABPROT 13.4  --   --   --   INR 1.1  --   --   --   HEPARINUNFRC <0.10*  --  0.76* 0.71*  CREATININE 0.92  --  0.90  --   TROPONINIHS 6 8  --   --     Estimated Creatinine Clearance: 103 mL/min (by C-G formula based on SCr of 0.9 mg/dL).   Medical History: Past Medical History:  Diagnosis Date  . Alcohol abuse   . Chronic pain   . COVID-19    a. 02/2019  . HFrEF (heart failure with reduced ejection fraction) ([place])    a. 05/2019 Echo: EF 35-40%.  Marland Kitchen NICM (nonischemic cardiomyopathy) ([place])    a. 05/2019 Echo: EF 35-40%, gr1 DD. Mod enlarged RV. Mildly dil LA. Mild to mod dil RA. Triv AI; b. 05/2019 MV: EF 32%, no ischemia.  . Persistent atrial fibrillation ([place])    a. Dx 05/2019 in setting of PE. CHA2DS2VASc = 1-->Eliquis later changed to xarelto; b. 07/2019 s/p DCCV (150J).  . Pulmonary embolism ([place])    a. 05/2019 CTA Chest: mild amt of PE w/in a lower lobe branch of RPA; b. 08/2019 CTA Chest: Small PE in RML and RLL PA.   Assessment: 59 year old male presented with SOB, pain and swelling in his left upper leg. Patient is on Xarelto PTA but has a h/o noncompliance. Patient reports not taking medications for the past week. CT chest positive for small PE in right middle and right lower lobe pulmonary arteries, new. Ultrasound left lower extremity positive for superficial venous thrombosis in the greater saphenous  vein throughout almost the entire thigh. Pharmacy consulted for heparin drip.  6/25 0029 HL 0.76, SUPRAtherapeutic (of note there was an issue w/ previous level drawn in ED therefore lab was redrawn)  6/25 0820 HL 0.71   Goal of Therapy:  Heparin level 0.3-0.7 units/ml Monitor platelets by anticoagulation protocol: Yes   Plan:  Heparin level is slightly supratherapeutic. Will decrease heparin drip to 1200 units/hr. Check heparin level in 6 hours. CBC daily while on heparin.   Oswald Hillock, PharmD, BCPS 09/09/2019,9:12 AM

## 2019-09-09 NOTE — Plan of Care (Signed)
  Problem: Pain Managment: Goal: General experience of comfort will improve Outcome: Progressing   Problem: Activity: Goal: Ability to tolerate increased activity will improve Outcome: Progressing   Problem: Cardiac: Goal: Ability to achieve and maintain adequate cardiopulmonary perfusion will improve Outcome: Progressing

## 2019-09-09 NOTE — Progress Notes (Signed)
Progress Note  Patient Name: Calvin Esparza Date of Encounter: 09/09/2019  Primary Cardiologist: Rockey Situ  Subjective   He remains in A. fib with ventricular rates ranging from the 80s to low 100s bpm.  He denies any chest pain, dyspnea, palpitations, dizziness, presyncope, syncope.  No lower extremity swelling.  He remains on Toprol-XL 150 mg daily and diltiazem drip at 5 mg/hr as well as heparin infusion.  Inpatient Medications    Scheduled Meds: . folic acid  1 mg Oral Daily  . furosemide  20 mg Oral Daily  . metoprolol succinate  100 mg Oral Daily  . metoprolol succinate  50 mg Oral QHS  . multivitamin with minerals  1 tablet Oral Daily  . thiamine  100 mg Oral Daily   Or  . thiamine  100 mg Intravenous Daily   Continuous Infusions: . heparin 1,200 Units/hr (09/09/19 1014)   PRN Meds: acetaminophen, LORazepam **OR** LORazepam, morphine injection, ondansetron (ZOFRAN) IV, oxyCODONE, sodium chloride   Vital Signs    Vitals:   09/09/19 0443 09/09/19 0656 09/09/19 0729 09/09/19 1237  BP: (!) 136/97 (!) 138/101 (!) 137/91 (!) 125/91  Pulse: 81 92 79 97  Resp:   19 19  Temp: 99.3 F (37.4 C)  98.4 F (36.9 C) 98.4 F (36.9 C)  TempSrc: Oral     SpO2: 96%  92% 97%  Weight: 91.5 kg     Height:        Intake/Output Summary (Last 24 hours) at 09/09/2019 1332 Last data filed at 09/09/2019 1236 Gross per 24 hour  Intake 240 ml  Output 1610 ml  Net -1370 ml   Filed Weights   09/08/19 1152 09/08/19 2157 09/09/19 0443  Weight: 90.7 kg 89.7 kg 91.5 kg    Telemetry    A. fib with ventricular rates ranging from 82 low 100s bpm - Personally Reviewed  ECG    No new tracings - Personally Reviewed  Physical Exam   GEN: No acute distress.   Neck: No JVD. Cardiac:  Irregularly irregular, no murmurs, rubs, or gallops.  Respiratory:  Faint crackles along the bilateral bases.  GI: Soft, nontender, non-distended.   MS: No edema; No deformity.  Bruising noted along  the medial aspect of the right foot, which patient attributes to a piece of shelving falling on his foot prior to his admission. Neuro:  Alert and oriented x 3; Nonfocal.  Psych: Normal affect.  Labs    Chemistry Recent Labs  Lab 09/08/19 1158 09/09/19 0029  NA 137 138  K 4.4 3.7  CL 105 106  CO2 22 23  GLUCOSE 116* 130*  BUN 18 17  CREATININE 0.92 0.90  CALCIUM 8.8* 8.5*  GFRNONAA >60 >60  GFRAA >60 >60  ANIONGAP 10 9     Hematology Recent Labs  Lab 09/08/19 1158 09/09/19 0029  WBC 7.4 6.6  RBC 4.24 3.94*  HGB 13.3 12.6*  HCT 40.7 38.4*  MCV 96.0 97.5  MCH 31.4 32.0  MCHC 32.7 32.8  RDW 14.6 14.6  PLT 203 195    Cardiac EnzymesNo results for input(s): TROPONINI in the last 168 hours. No results for input(s): TROPIPOC in the last 168 hours.   BNP Recent Labs  Lab 09/08/19 1158  BNP 669.8*     DDimer No results for input(s): DDIMER in the last 168 hours.   Radiology    DG Chest 2 View  Result Date: 09/08/2019 IMPRESSION: New slight pulmonary vascular prominence and tiny left  pleural effusion. Electronically Signed   By: Lorriane Shire M.D.   On: 09/08/2019 12:32   CT Angio Chest PE W/Cm &/Or Wo Cm  Result Date: 09/08/2019 IMPRESSION: Small pulmonary emboli in RIGHT middle and RIGHT lower lobe pulmonary arteries, new. Enlarged central pulmonary arteries which may reflect pulmonary arterial hypertension. Minimal aneurysmal dilatation of ascending thoracic aorta 4.0 cm transverse; recommend annual imaging followup by CTA or MRA. This recommendation follows the 2010 ACCF/AHA/AATS/ACR/ASA/SCA/SCAI/SIR/STS/SVM Guidelines for the Diagnosis and Management of Patients with Thoracic Aortic Disease. Circulation. 2010; 121: Y650-P546. Aortic aneurysm NOS (ICD10-I71.9) Aortic Atherosclerosis (ICD10-I70.0). Aortic Aneurysm NOS (ICD10-I71.9). Critical Value/emergent results were called by telephone at the time of interpretation on 09/08/2019 at 1:44 pm to provider Dr.  Quentin Cornwall, who verbally acknowledged these results. Electronically Signed   By: Lavonia Dana M.D.   On: 09/08/2019 13:46   US Venous Img Lower Unilateral Left (DVT)  Result Date: 09/08/2019 IMPRESSION: The exam is positive for superficial venous thrombosis in the greater saphenous vein throughout almost the entire thigh. No DVT is identified. Electronically Signed   By: Inge Rise M.D.   On: 09/08/2019 14:00    Cardiac Studies   2D echo 05/2019: 1. Left ventricular ejection fraction, by estimation, is 35 to 40%. The  left ventricle has moderately decreased function. The left ventricle  demonstrates global hypokinesis. There is mild left ventricular  hypertrophy. Left ventricular diastolic  parameters are consistent with Grade I diastolic dysfunction (impaired  relaxation).  2. Right ventricular systolic function is mildly reduced. The right  ventricular size is moderately enlarged.  3. Left atrial size was mildly dilated.  4. Right atrial size was mild to moderately dilated.  5. The mitral valve is grossly normal. No evidence of mitral valve  regurgitation.  6. The aortic valve is tricuspid. Aortic valve regurgitation is trivial.  7. The inferior vena cava is normal in size with greater than 50%  respiratory variability, suggesting right atrial pressure of 3 mmHg.  Patient Profile     59 y.o. male with history of persistent A. fib status post recent DCCV, HFrEF secondary to NICM with an EF of 35 to 40% by echo in 05/2019, pulmonary embolism diagnosed in 05/2019, COVID-19 infection 02/2019, alcohol use, and chronic pain who we are seeing for the evaluation of recurrent A. fib in the setting of right middle and lower lobe pulmonary emboli with noncompliance with Xarelto.  Assessment & Plan    1.  Persistent A. fib with RVR: -His A. fib was initially diagnosed in 05/2019 in the setting of PE with the patient being status post successful cardioversion on 07/29/2019 -He is now  readmitted with persistent A. fib with RVR in the setting of recurrent PE with medical noncompliance -Ventricular rates are improving -Discontinue IV diltiazem, for now we will not add/escalate rate control therapy given he was only on 5 mg of dilt drip -Continue Toprol-XL -He will need to ambulate to assess for rate and symptom control, if he tolerates this well we will plan for rate control for 4 weeks followed by repeat DCCV at that time -However, if his ventricular rates are difficult to control or if he is symptomatic with ambulation he will need TEE guided DCCV prior to discharge which would need to be undertaken first part of next week -Potential discharge on 6/26 pending patient course  2.  Acute on chronic HFrEF secondary to NICM: -Faint crackles noted on the bilateral lung bases on exam today -Give a one-time dose  of IV Lasix with KCl this afternoon followed by resumption of oral Lasix on 6/26 -Continue Toprol-XL -Look to add back PTA losartan prior to discharge -In follow-up, consider addition of MRA -Plan for repeat echo in several months time after restoring sinus rhythm to reassess LV systolic function -Compliance with medical therapy was stressed  3.  Right middle and lower lobe PE/superficial venous thrombosis in the left greater saphenous vein: -Patient previously diagnosed with mild PE within the lower branch of the right pulmonary artery in 05/2019 and was placed on PE dosed Eliquis at that time with subsequent noncompliance -Remains on heparin drip at this time with plan to transition back to oral anticoagulation prior to discharge  4.  Alcohol use: -Cessation is advised as this is contributing to his A. fib and cardiomyopathy  For questions or updates, please contact [facility] Please consult [email or web address] for contact info under Cardiology/STEMI.    Signed, Christell Faith, PA-C [place] Pager: [phone] 09/09/2019, 1:32 PM

## 2019-09-10 DIAGNOSIS — I420 Dilated cardiomyopathy: Secondary | ICD-10-CM

## 2019-09-10 DIAGNOSIS — I5042 Chronic combined systolic (congestive) and diastolic (congestive) heart failure: Secondary | ICD-10-CM

## 2019-09-10 DIAGNOSIS — F1010 Alcohol abuse, uncomplicated: Secondary | ICD-10-CM

## 2019-09-10 DIAGNOSIS — I5023 Acute on chronic systolic (congestive) heart failure: Secondary | ICD-10-CM

## 2019-09-10 DIAGNOSIS — I4819 Other persistent atrial fibrillation: Secondary | ICD-10-CM

## 2019-09-10 LAB — BASIC METABOLIC PANEL
Anion gap: 9 (ref 5–15)
BUN: 15 mg/dL (ref 6–20)
CO2: 25 mmol/L (ref 22–32)
Calcium: 8.8 mg/dL — ABNORMAL LOW (ref 8.9–10.3)
Chloride: 105 mmol/L (ref 98–111)
Creatinine, Ser: 0.92 mg/dL (ref 0.61–1.24)
GFR calc Af Amer: >60 mL/min (ref >60)
GFR calc non Af Amer: >60 mL/min (ref >60)
Glucose, Bld: 105 mg/dL — ABNORMAL HIGH (ref 70–99)
Potassium: 3.9 mmol/L (ref 3.5–5.1)
Sodium: 139 mmol/L (ref 135–145)

## 2019-09-10 LAB — CBC
HCT: 40.2 % (ref 39.0–52.0)
Hemoglobin: 13.3 g/dL (ref 13.0–17.0)
MCH: 32 pg (ref 26.0–34.0)
MCHC: 33.1 g/dL (ref 30.0–36.0)
MCV: 96.9 fL (ref 80.0–100.0)
Platelets: 180 10*3/uL (ref 150–400)
RBC: 4.15 MIL/uL — ABNORMAL LOW (ref 4.22–5.81)
RDW: 14.1 % (ref 11.5–15.5)
WBC: 5.3 10*3/uL (ref 4.0–10.5)
nRBC: 0 % (ref 0.0–0.2)

## 2019-09-10 LAB — HEPARIN LEVEL (UNFRACTIONATED): Heparin Unfractionated: 0.49 IU/mL (ref 0.30–0.70)

## 2019-09-10 MED ORDER — RIVAROXABAN 20 MG PO TABS: 20.0000 mg | ORAL_TABLET | Freq: Every day | ORAL | Status: DC

## 2019-09-10 MED ORDER — FUROSEMIDE 20 MG PO TABS
20.0000 mg | ORAL_TABLET | Freq: Every day | ORAL | 0 refills | Status: DC
Start: 2019-09-10 — End: 2019-10-11

## 2019-09-10 MED ORDER — DILTIAZEM HCL ER COATED BEADS 240 MG PO CP24: 240.0000 mg | ORAL_CAPSULE | Freq: Every day | ORAL | 0 refills | Status: DC

## 2019-09-10 MED ORDER — RIVAROXABAN (XARELTO) VTE STARTER PACK (15 & 20 MG)
ORAL_TABLET | ORAL | 0 refills | Status: DC
Start: 2019-09-10 — End: 2019-09-21

## 2019-09-10 MED ORDER — RIVAROXABAN 15 MG PO TABS
15.0000 mg | ORAL_TABLET | Freq: Two times a day (BID) | ORAL | Status: DC
  Administered 2019-09-10: 15 mg via ORAL
  Filled 2019-09-10 (×2): qty 1

## 2019-09-10 MED ORDER — DILTIAZEM HCL ER COATED BEADS 120 MG PO CP24
120.0000 mg | ORAL_CAPSULE | Freq: Every day | ORAL | Status: DC
  Administered 2019-09-10: 120 mg via ORAL
  Filled 2019-09-10: qty 1

## 2019-09-10 NOTE — Progress Notes (Addendum)
Progress Note  Patient Name: Calvin Esparza Date of Encounter: 09/10/2019  Primary Cardiologist: Rockey Situ  Subjective   He remains in A. fib with ventricular rates ranging from the low 100s to 120s bpm mostly though will peak into the 140s bpm with movement. Off diltiazem drip as of 6/25.  During his last admission he responded to diltiazem quite well.  He denies any chest pain, dyspnea, palpitations, dizziness, presyncope, syncope.  No lower extremity swelling.  He remains on Toprol-XL 150 mg daily.  He was given a one-time dose of IV Lasix 20 mg 6/25 with a documented urine output of 1.6 L for the past 24 hours.  Renal function stable.  Potassium 3.9.  Inpatient Medications    Scheduled Meds: . folic acid  1 mg Oral Daily  . furosemide  20 mg Oral Daily  . metoprolol succinate  100 mg Oral Daily  . metoprolol succinate  50 mg Oral QHS  . multivitamin with minerals  1 tablet Oral Daily  . thiamine  100 mg Oral Daily   Or  . thiamine  100 mg Intravenous Daily   Continuous Infusions: . heparin 1,250 Units/hr (09/10/19 0128)   PRN Meds: acetaminophen, LORazepam **OR** LORazepam, morphine injection, ondansetron (ZOFRAN) IV, oxyCODONE, sodium chloride   Vital Signs    Vitals:   09/09/19 1616 09/09/19 1933 09/09/19 2208 09/10/19 0450  BP: (!) 130/97 94/65 (!) 125/96 (!) 140/98  Pulse: 88 83 (!) 102 (!) 110  Resp: 19 20  20   Temp: 98.7 F (37.1 C) 98.1 F (36.7 C)  98.8 F (37.1 C)  TempSrc: Oral Oral  Oral  SpO2: 98% 97%  95%  Weight:    86 kg  Height:        Intake/Output Summary (Last 24 hours) at 09/10/2019 0759 Last data filed at 09/10/2019 0451 Gross per 24 hour  Intake 1516.57 ml  Output 3175 ml  Net -1658.43 ml   Filed Weights   09/08/19 2157 09/09/19 0443 09/10/19 0450  Weight: 89.7 kg 91.5 kg 86 kg    Telemetry    A. fib with ventricular rates ranging from 82 low 100s bpm - Personally Reviewed  ECG    No new tracings - Personally  Reviewed  Physical Exam   GEN: No acute distress.   Neck: No JVD. Cardiac:  Mildly tachycardic, irregularly irregular, no murmurs, rubs, or gallops.  Respiratory:  Clear to ausculation bilaterally.  GI: Soft, nontender, non-distended.   MS: No edema; No deformity.  Bruising noted along the medial aspect of the right foot, which patient attributes to a piece of shelving falling on his foot prior to his admission. Neuro:  Alert and oriented x 3; Nonfocal.  Psych: Normal affect.  Labs    Chemistry Recent Labs  Lab 09/09/19 0029 09/09/19 1504 09/10/19 0551  NA 138 137 139  K 3.7 4.1 3.9  CL 106 100 105  CO2 23 26 25   GLUCOSE 130* 111* 105*  BUN 17 13 15   CREATININE 0.90 1.03 0.92  CALCIUM 8.5* 9.2 8.8*  PROT  --  7.0  --   ALBUMIN  --  3.6  --   AST  --  21  --   ALT  --  30  --   ALKPHOS  --  54  --   BILITOT  --  1.1  --   GFRNONAA >60 >60 >60  GFRAA >60 >60 >60  ANIONGAP 9 11 9      Hematology Recent  Labs  Lab 09/09/19 0029 09/09/19 1504 09/10/19 0551  WBC 6.6 6.4 5.3  RBC 3.94* 4.39 4.15*  HGB 12.6* 14.1 13.3  HCT 38.4* 42.1 40.2  MCV 97.5 95.9 96.9  MCH 32.0 32.1 32.0  MCHC 32.8 33.5 33.1  RDW 14.6 14.5 14.1  PLT 195 212 180    Cardiac EnzymesNo results for input(s): TROPONINI in the last 168 hours. No results for input(s): TROPIPOC in the last 168 hours.   BNP Recent Labs  Lab 09/08/19 1158  BNP 669.8*     DDimer No results for input(s): DDIMER in the last 168 hours.   Radiology    DG Chest 2 View  Result Date: 09/08/2019 IMPRESSION: New slight pulmonary vascular prominence and tiny left pleural effusion. Electronically Signed   By: Lorriane Shire M.D.   On: 09/08/2019 12:32   CT Angio Chest PE W/Cm &/Or Wo Cm  Result Date: 09/08/2019 IMPRESSION: Small pulmonary emboli in RIGHT middle and RIGHT lower lobe pulmonary arteries, new. Enlarged central pulmonary arteries which may reflect pulmonary arterial hypertension. Minimal aneurysmal  dilatation of ascending thoracic aorta 4.0 cm transverse; recommend annual imaging followup by CTA or MRA. This recommendation follows the 2010 ACCF/AHA/AATS/ACR/ASA/SCA/SCAI/SIR/STS/SVM Guidelines for the Diagnosis and Management of Patients with Thoracic Aortic Disease. Circulation. 2010; 121: C376-E831. Aortic aneurysm NOS (ICD10-I71.9) Aortic Atherosclerosis (ICD10-I70.0). Aortic Aneurysm NOS (ICD10-I71.9). Critical Value/emergent results were called by telephone at the time of interpretation on 09/08/2019 at 1:44 pm to provider Dr. Quentin Cornwall, who verbally acknowledged these results. Electronically Signed   By: Lavonia Dana M.D.   On: 09/08/2019 13:46   US Venous Img Lower Unilateral Left (DVT)  Result Date: 09/08/2019 IMPRESSION: The exam is positive for superficial venous thrombosis in the greater saphenous vein throughout almost the entire thigh. No DVT is identified. Electronically Signed   By: Inge Rise M.D.   On: 09/08/2019 14:00    Cardiac Studies   2D echo 05/2019: 1. Left ventricular ejection fraction, by estimation, is 35 to 40%. The  left ventricle has moderately decreased function. The left ventricle  demonstrates global hypokinesis. There is mild left ventricular  hypertrophy. Left ventricular diastolic  parameters are consistent with Grade I diastolic dysfunction (impaired  relaxation).  2. Right ventricular systolic function is mildly reduced. The right  ventricular size is moderately enlarged.  3. Left atrial size was mildly dilated.  4. Right atrial size was mild to moderately dilated.  5. The mitral valve is grossly normal. No evidence of mitral valve  regurgitation.  6. The aortic valve is tricuspid. Aortic valve regurgitation is trivial.  7. The inferior vena cava is normal in size with greater than 50%  respiratory variability, suggesting right atrial pressure of 3 mmHg.  Patient Profile     59 y.o. male with history of persistent A. fib status post  recent DCCV, HFrEF secondary to NICM with an EF of 35 to 40% by echo in 05/2019, pulmonary embolism diagnosed in 05/2019, COVID-19 infection 02/2019, alcohol use, and chronic pain who we are seeing for the evaluation of recurrent A. fib in the setting of right middle and lower lobe pulmonary emboli with noncompliance with Xarelto.  Assessment & Plan    1.  Persistent A. fib with RVR: -His A. fib was initially diagnosed in 05/2019 in the setting of PE with the patient being status post successful cardioversion on 07/29/2019 -He is now readmitted with persistent A. fib with RVR in the setting of recurrent PE with medical noncompliance -Ventricular  rates are improving -Diltiazem drip was discontinued on 6/25  -During his last admission his ventricular rates responded quite well to diltiazem and given his cardiomyopathy was felt to be tachycardia mediated this was felt to be an acceptable use -Add Cardizem CD 120 mg daily -Continue Toprol-XL -He will need to ambulate to assess for rate and symptom control, if he tolerates this well we will plan for rate control for 4 weeks followed by repeat DCCV at that time -However, if his ventricular rates are difficult to control or if he is symptomatic with ambulation he will need TEE guided DCCV prior to discharge which would need to be undertaken first part of next week -Perhaps, his ventricular rates are well controlled with addition of oral Cardizem, he can go later this afternoon   2.  Acute on chronic HFrEF secondary to NICM: -He appears euvolemic and well compensated -Continue oral Lasix with KCl repletion -Continue Toprol-XL -Look to add back PTA losartan when able currently, will need added BP room for rate control -In follow-up, consider addition of MRA -His cardiomyopathy has felt to be tachycardia mediated and in this setting we will use diltiazem for added rate control as he has responded well to this in the past -Plan for repeat echo in several  months time after restoring sinus rhythm to reassess LV systolic function -Compliance with medical therapy was stressed  3.  Right middle and lower lobe PE/superficial venous thrombosis in the left greater saphenous vein: -Patient previously diagnosed with mild PE within the lower branch of the right pulmonary artery in 05/2019 and was placed on PE dosed Eliquis at that time with subsequent noncompliance -Remains on heparin drip at this time with plan to transition back to oral anticoagulation prior to discharge  4.  Alcohol use: -Cessation is advised as this is contributing to his A. fib and cardiomyopathy  For questions or updates, please contact [facility] Please consult [email or web address] for contact info under Cardiology/STEMI.    Signed, Christell Faith, PA-C [place] Pager: [phone] 09/10/2019, 7:59 AM   Attending Note:   The patient was seen and examined.  Agree with assessment and plan as noted above.  Changes made to the above note as needed.  Patient seen and independently examined with Christell Faith, PA .   We discussed all aspects of the encounter. I agree with the assessment and plan as stated above.  1.   Atrial flutter / fib:   HR is better but is still a bit elevated.  Has been started on Dilt CD 120 mg   day .    Given his LV dysfunction, ideally he would be on beta blocker but he responds very well to Dilt.   In addition,  He does not take his beta blocker due to fatigue . His LV dysfunctioin is likely rate related so will hopefully improve as we control his HR   2.  DVT / PE :   On heparin ,  transition to xarelto 15 mg BID for 21 days ( less the number of days he was on IV heparin , so ~ 19 days )  Then xarelto 20 mg a day thereafter.  Follow up with Dr. Rockey Situ or APP in several weeks.     I have spent a total of 40 minutes with patient reviewing hospital  notes , telemetry, EKGs, labs and examining patient as well as establishing an assessment and plan  that was discussed with the patient. > 50% of time  was spent in direct patient care.    Thayer Headings, Brooke Bonito., MD, [facility] 09/10/2019, 1:09 PM [address],  [place] Pager [phone]

## 2019-09-10 NOTE — Progress Notes (Addendum)
ANTICOAGULATION CONSULT NOTE  Pharmacy Consult for heparin Indication: atrial fibrillation and DVT  No Known Allergies  Patient Measurements: Height: 5\' 10"  (177.8 cm) Weight: 86 kg (189 lb 11.2 oz) IBW/kg (Calculated) : 73 Heparin Dosing Weight: 90 kg  Vital Signs: Temp: 98.8 F (37.1 C) (06/26 0450) Temp Source: Oral (06/26 0450) BP: 140/98 (06/26 0450) Pulse Rate: 110 (06/26 0450)  Labs: Recent Labs    09/08/19 1158 09/08/19 1158 09/08/19 1422 09/09/19 0029 09/09/19 0820 09/09/19 1504 09/09/19 2255 09/10/19 0551  HGB 13.3   < >  --  12.6*  --  14.1  --  13.3  HCT 40.7   < >  --  38.4*  --  42.1  --  40.2  PLT 203   < >  --  195  --  212  --  180  APTT 25  --   --   --   --   --   --   --   LABPROT 13.4  --   --   --   --   --   --   --   INR 1.1  --   --   --   --   --   --   --   HEPARINUNFRC <0.10*   < >  --  0.76*   < > 0.29* 0.56 0.49  CREATININE 0.92   < >  --  0.90  --  1.03  --  0.92  TROPONINIHS 6  --  8  --   --   --   --   --    < > = values in this interval not displayed.    Estimated Creatinine Clearance: 91.5 mL/min (by C-G formula based on SCr of 0.92 mg/dL).   Medical History: Past Medical History:  Diagnosis Date  . Alcohol abuse   . Chronic pain   . COVID-19    a. 02/2019  . HFrEF (heart failure with reduced ejection fraction) ([place])    a. 05/2019 Echo: EF 35-40%.  Marland Kitchen NICM (nonischemic cardiomyopathy) ([place])    a. 05/2019 Echo: EF 35-40%, gr1 DD. Mod enlarged RV. Mildly dil LA. Mild to mod dil RA. Triv AI; b. 05/2019 MV: EF 32%, no ischemia.  . Persistent atrial fibrillation ([place])    a. Dx 05/2019 in setting of PE. CHA2DS2VASc = 1-->Eliquis later changed to xarelto; b. 07/2019 s/p DCCV (150J).  . Pulmonary embolism ([place])    a. 05/2019 CTA Chest: mild amt of PE w/in a lower lobe branch of RPA; b. 08/2019 CTA Chest: Small PE in RML and RLL PA.   Assessment: 59 year old male presented with SOB, pain and swelling in his left upper leg. Patient is  on Xarelto PTA but has a h/o noncompliance. Patient reports not taking medications for the past week. CT chest positive for small PE in right middle and right lower lobe pulmonary arteries, new. Ultrasound left lower extremity positive for superficial venous thrombosis in the greater saphenous vein throughout almost the entire thigh. Pharmacy consulted for heparin drip.  6/25 0029 HL 0.76, SUPRAtherapeutic (of note there was an issue w/ previous level drawn in ED therefore lab was redrawn)  6/25 0820 HL 0.71 - decrease heparin drip to 1200 units/hr.  6/25 2255 HL 0.56, therapeutic x 1 6/26 0551 HL 0.49, therapeutic x 2, CBC stable.  Goal of Therapy:  Heparin level 0.3-0.7 units/ml Monitor platelets by anticoagulation protocol: Yes   Plan:  Heparin level is therapeutic  x 2. Will continue heparin drip at 1250 units/hr.  Check heparin and CBC daily while on heparin.   Pharmacy will continue to follow.   Ena Dawley, PharmD 09/10/2019,7:20 AM

## 2019-09-10 NOTE — Discharge Summary (Signed)
Physician Discharge Summary  Calvin Esparza YKZ:[id number] DOB: [birth year] DOA: 09/08/2019  PCP: [name], MD  Admit date: 09/08/2019 Discharge date: 09/10/2019  Time spent: 45 minutes  Recommendations for Outpatient Follow-up:  Patient will be discharged to home.  Patient will need to follow up with primary care provider within one week of discharge.  Follow up with cardiology. Patient should continue medications as prescribed.  Patient should follow a heart healthy diet.   Discharge Diagnoses:  Atrial fibrillation/flutterwith RVR-persistent Acute PE/superficial thrombosis lower extremity Dyspnea Ascending thoracic aortic aneurysm Chronic systolic/diastolic heart failure/nonischemic cardiomyopathy Alcohol abuse  Discharge Condition: Stable  Diet recommendation: heart healthy  Filed Weights   09/08/19 2157 09/09/19 0443 09/10/19 0450  Weight: 89.7 kg 91.5 kg 86 kg    History of present illness:  On 09/08/2019  Calvin Dec Wickeris a 59 y.o.malewith a medical history ofCHF, COVID-19 in December 2020, atrial fibrillation, pulmonary embolism, presented to the hospital with complaints of shortness of breath and leg pain. Patient states that his shortness of breath is been on and off since having Covid however has worsened over the last week. Also started having left lower extremity pain. Patient admits to not taking his Xarelto and other medications for the past week as he was placed on prednisone for foot pain and this made him "loopy". Currently patient denies chest pain, dizziness or headache, abdominal pain, nausea or vomiting, diarrhea or constipation, changes in bowel or urinary habits, recent illness or travel.  Hospital Course:  Atrial fibrillation/flutterwith RVR-persistent -Patient was supposed to be taking Xarelto as an outpatient however did not take any over the past week -Presented with heart rates in the 140s -Diltiazem drip along with heparin -Had  cardioversion approximately 1 month ago -Cardiology consulted and appreciated-patient may need repeat version in 3 to 4 weeks -Patient has been weaned off of diltiazem drip and started on oral Cardizem -Patient was on Cardizem 360 mg prior to admission, discussed with Dr. Acie Fredrickson, cardiology-recommended decreasing dose of 240 mg and continuing metoprolol with outpatient follow-up. -Patient was able to ambulate and had heart rates in the low 100s, 107  Acute PE/superficial thrombosis lower extremity -Patient was noted to have acute PE back in March 2021 and was placed on Xarelto-as above, patient has not taken his medication in over a week -CTA chest showed small pulmonary emboli in the right middle and right lower lobe pulmonary arteries, new.Enlarged central pulmonary arteries which may reflect pulmonary arterial hypertension. -Left lower extremity ultrasound positive for superficial venous thrombosis in the greater saphenous vein throughout almost the entire thigh -have asked for home O2/desat eval -Transitioned patient to Xarelto which she was on prior to admission  Dyspnea -Patient was Covid positive back in December 2020 -Suspect multifactorial including atrial fibrillation with RVR as well as acute PE -Continue to treat underlying etiology -worse with lying flat -Does not appear to be volume overloaded however will change his Lasix from as needed to scheduled daily  Ascending thoracic aortic aneurysm -Noted on CT chest -Aneurysmal dilatation of a sending thoracic aorta at 4 cm transverse -Patient will need follow-up imaging yearly  Chronic systolic/diastolic heart failure/nonischemic cardiomyopathy -Echocardiogram 06/08/2019 showed an EF of 35 to 93%, grade 1 diastolic dysfunction -Continue Lasix-change from as needed to 20 mg scheduled daily -Patient currently appears to be euvolemic and compensated  Alcohol abuse -Was placed on CIWA -Currently no withdrawal symptoms  -Discussed cessation  Procedures: Left lower extremity Doppler  Consultations: Cardiology  Discharge Exam: Vitals:  09/10/19 1200 09/10/19 1300  BP:    Pulse:    Resp: (!) 26 (!) 22  Temp:    SpO2:       General: Well developed, well nourished, NAD, appears stated age  HEENT: NCAT, mucous membranes moist.  Cardiovascular: S1 S2 auscultated, Irregular  Respiratory: Clear to auscultation bilaterally   Abdomen: Soft, nontender, nondistended, + bowel sounds  Extremities: warm dry without cyanosis clubbing or edema  Neuro: AAOx3, nonfocal  Psych: pleasant, appropriate mood and affect  Discharge Instructions Discharge Instructions    Discharge instructions   Complete by: As directed    Patient will be discharged to home.  Patient will need to follow up with primary care provider within one week of discharge.  Follow up with cardiology. Patient should continue medications as prescribed.  Patient should follow a heart healthy diet.     Allergies as of 09/10/2019   No Known Allergies     Medication List    STOP taking these medications   predniSONE 10 MG tablet Commonly known as: DELTASONE   rivaroxaban 20 MG Tabs tablet Commonly known as: XARELTO Replaced by: Rivaroxaban Stater Pack (15 mg and 20 mg)     TAKE these medications   diltiazem 240 MG 24 hr capsule Commonly known as: CARDIZEM CD Take 1 capsule (240 mg total) by mouth daily. Start taking on: September 11, 2019 What changed:   medication strength  how much to take   furosemide 20 MG tablet Commonly known as: LASIX Take 1 tablet (20 mg total) by mouth daily. What changed:   when to take this  reasons to take this   metoprolol succinate 100 MG 24 hr tablet Commonly known as: TOPROL-XL Take 1 tablet (100 mg total) by mouth daily. Take with or immediately following a meal. What changed:   when to take this  additional instructions   metoprolol succinate 50 MG 24 hr tablet Commonly known  as: TOPROL-XL Take 1 tablet (50 mg total) by mouth daily. Take with or immediately following a meal. What changed:   when to take this  additional instructions   Rivaroxaban Stater Pack (15 mg and 20 mg) Commonly known as: XARELTO STARTER PACK Follow package directions: Take one 15mg  tablet by mouth twice a day. On day 22, switch to one 20mg  tablet once a day. Take with food. Replaces: rivaroxaban 20 MG Tabs tablet      No Known Allergies  Follow-up Information    [facility] Follow up on 10/13/2019.   Specialty: Cardiology Why: at 12:00pm. Enter through the [place] entrance Contact information: [address] [place] [phone]       [name], MD. Schedule an appointment as soon as possible for a visit in 1 week(s).   Specialty: Internal Medicine Why: Hospital follow up Contact information: [place] [address] [phone]        Minna Merritts, MD .   Specialty: Cardiology Contact information: [place] [address] [phone]                The results of significant diagnostics from this hospitalization (including imaging, microbiology, ancillary and laboratory) are listed below for reference.    Significant Diagnostic Studies: DG Chest 2 View  Result Date: 09/08/2019 CLINICAL DATA:  Shortness of breath. EXAM: CHEST - 2 VIEW COMPARISON:  06/07/2027 FINDINGS: The  heart is in the range in size. Slight pulmonary vascular prominence. Tiny left pleural effusion is new. No infiltrates. No acute bone abnormality. IMPRESSION: New slight pulmonary vascular prominence and tiny left pleural effusion. Electronically Signed   By: Lorriane Shire M.D.   On: 09/08/2019 12:32   CT Angio Chest PE W/Cm &/Or Wo Cm  Result Date: 09/08/2019 CLINICAL DATA:  Shortness of breath today, LEFT thigh  pain for 1 week, past history of pulmonary embolism no longer on anticoagulation EXAM: CT ANGIOGRAPHY CHEST WITH CONTRAST TECHNIQUE: Multidetector CT imaging of the chest was performed using the standard protocol during bolus administration of intravenous contrast. Multiplanar CT image reconstructions and MIPs were obtained to evaluate the vascular anatomy. CONTRAST:  13mL OMNIPAQUE IOHEXOL 350 MG/ML SOLN IV COMPARISON:  06/07/2019 FINDINGS: Cardiovascular: Minimal aneurysmal dilatation ascending thoracic aorta 4.0 cm transverse. Enlargement of cardiac chambers. No pericardial effusion. Enlarged central pulmonary arteries, main pulmonary artery 3.6 cm transverse RIGHT LEFT pulmonary arteries 2.8 cm transverse. Pulmonary arteries well opacified. Small filling defects identified in RIGHT middle lobe and RIGHT lower lobe pulmonary arteries consistent with pulmonary emboli new since prior study. Minimal atherosclerotic calcification aorta. Mediastinum/Nodes: Esophagus unremarkable. Base of cervical region normal appearance. No thoracic adenopathy. Lungs/Pleura: Lungs clear. No infiltrate, pleural effusion, pneumothorax or mass. Upper Abdomen: Unremarkable Musculoskeletal: Unremarkable Review of the MIP images confirms the above findings. IMPRESSION: Small pulmonary emboli in RIGHT middle and RIGHT lower lobe pulmonary arteries, new. Enlarged central pulmonary arteries which may reflect pulmonary arterial hypertension. Minimal aneurysmal dilatation of ascending thoracic aorta 4.0 cm transverse; recommend annual imaging followup by CTA or MRA. This recommendation follows the 2010 ACCF/AHA/AATS/ACR/ASA/SCA/SCAI/SIR/STS/SVM Guidelines for the Diagnosis and Management of Patients with Thoracic Aortic Disease. Circulation. 2010; 121: R154-M086. Aortic aneurysm NOS (ICD10-I71.9) Aortic Atherosclerosis (ICD10-I70.0). Aortic Aneurysm NOS (ICD10-I71.9). Critical Value/emergent results were called by telephone at the time of  interpretation on 09/08/2019 at 1:44 pm to provider Dr. Quentin Cornwall, who verbally acknowledged these results. Electronically Signed   By: Lavonia Dana M.D.   On: 09/08/2019 13:46   US Venous Img Lower Unilateral Left (DVT)  Result Date: 09/08/2019 CLINICAL DATA:  Left leg swelling for 1 day.  Shortness of breath. EXAM: RIGHT LOWER EXTREMITY VENOUS DOPPLER ULTRASOUND TECHNIQUE: Gray-scale sonography with compression, as well as color and duplex ultrasound, were performed to evaluate the deep venous system(s) from the level of the common femoral vein through the popliteal and proximal calf veins. COMPARISON:  None. FINDINGS: VENOUS Normal compressibility of the common femoral, superficial femoral, and popliteal veins. Visualized portions of profunda femoral vein and great saphenous vein unremarkable. No filling defects to suggest DVT on grayscale or color Doppler imaging. Doppler waveforms show normal direction of venous flow, normal respiratory plasticity and response to augmentation. Thrombus is identified in the greater saphenous vein throughout almost the entire thigh. Limited views of the contralateral common femoral vein are unremarkable. OTHER None. Limitations: none IMPRESSION: The exam is positive for superficial venous thrombosis in the greater saphenous vein throughout almost the entire thigh. No DVT is identified. Electronically Signed   By: Inge Rise M.D.   On: 09/08/2019 14:00    Microbiology: Recent Results (from the past 240 hour(s))  SARS Coronavirus 2 by RT PCR (hospital order, performed in [facility] hospital lab) Nasopharyngeal Nasopharyngeal Swab     Status: None   Collection Time: 09/08/19  2:22 PM   Specimen: Nasopharyngeal Swab  Result Value Ref Range Status   SARS Coronavirus 2 NEGATIVE NEGATIVE Final  Comment: (NOTE) SARS-CoV-2 target nucleic acids are NOT DETECTED.  The SARS-CoV-2 RNA is generally detectable in upper and lower respiratory specimens during the acute  phase of infection. The lowest concentration of SARS-CoV-2 viral copies this assay can detect is 250 copies / mL. A negative result does not preclude SARS-CoV-2 infection and should not be used as the sole basis for treatment or other patient management decisions.  A negative result may occur with improper specimen collection / handling, submission of specimen other than nasopharyngeal swab, presence of viral mutation(s) within the areas targeted by this assay, and inadequate number of viral copies (<250 copies / mL). A negative result must be combined with clinical observations, patient history, and epidemiological information.  Fact Sheet for Patients:   StrictlyIdeas.no  Fact Sheet for Healthcare Providers: BankingDealers.co.za  This test is not yet approved or  cleared by the [place] FDA and has been authorized for detection and/or diagnosis of SARS-CoV-2 by FDA under an Emergency Use Authorization (EUA).  This EUA will remain in effect (meaning this test can be used) for the duration of the COVID-19 declaration under Section 564(b)(1) of the Act, 21 U.S.C. section 360bbb-3(b)(1), unless the authorization is terminated or revoked sooner.  Performed at [facility], [address] [place], [place] [address]      Labs: Basic Metabolic Panel: Recent Labs  Lab 09/08/19 1158 09/09/19 0029 09/09/19 1504 09/10/19 0551  NA 137 138 137 139  K 4.4 3.7 4.1 3.9  CL 105 106 100 105  CO2 22 23 26 25   GLUCOSE 116* 130* 111* 105*  BUN 18 17 13 15   CREATININE 0.92 0.90 1.03 0.92  CALCIUM 8.8* 8.5* 9.2 8.8*  MG  --   --  2.2  --   PHOS  --   --  3.8  --    Liver Function Tests: Recent Labs  Lab 09/09/19 1504  AST 21  ALT 30  ALKPHOS 54  BILITOT 1.1  PROT 7.0  ALBUMIN 3.6   No results for input(s): LIPASE, AMYLASE in the last 168 hours. No results for input(s): AMMONIA in the last 168 hours. CBC: Recent  Labs  Lab 09/08/19 1158 09/09/19 0029 09/09/19 1504 09/10/19 0551  WBC 7.4 6.6 6.4 5.3  HGB 13.3 12.6* 14.1 13.3  HCT 40.7 38.4* 42.1 40.2  MCV 96.0 97.5 95.9 96.9  PLT 203 195 212 180   Cardiac Enzymes: No results for input(s): CKTOTAL, CKMB, CKMBINDEX, TROPONINI in the last 168 hours. BNP: BNP (last 3 results) Recent Labs    03/14/19 1232 09/08/19 1158  BNP 316.0* 669.8*    ProBNP (last 3 results) No results for input(s): PROBNP in the last 8760 hours.  CBG: No results for input(s): GLUCAP in the last 168 hours.     Signed:  Cristal Ford  [facility] 09/10/2019, 2:37 PM

## 2019-09-10 NOTE — Consult Note (Signed)
ANTICOAGULATION CONSULT NOTE - Initial Consult  Pharmacy Consult for Xarelto  Indication: pulmonary embolus  No Known Allergies  Patient Measurements: Height: 5\' 10"  (177.8 cm) Weight: 86 kg (189 lb 11.2 oz) IBW/kg (Calculated) : 73  Vital Signs: Temp: 98.5 F (36.9 C) (06/26 1118) Temp Source: Oral (06/26 0450) BP: 129/104 (06/26 1118) Pulse Rate: 122 (06/26 1118)  Labs: Recent Labs    09/08/19 1158 09/08/19 1158 09/08/19 1422 09/09/19 0029 09/09/19 0820 09/09/19 1504 09/09/19 2255 09/10/19 0551  HGB 13.3   < >  --  12.6*  --  14.1  --  13.3  HCT 40.7   < >  --  38.4*  --  42.1  --  40.2  PLT 203   < >  --  195  --  212  --  180  APTT 25  --   --   --   --   --   --   --   LABPROT 13.4  --   --   --   --   --   --   --   INR 1.1  --   --   --   --   --   --   --   HEPARINUNFRC <0.10*   < >  --  0.76*   < > 0.29* 0.56 0.49  CREATININE 0.92   < >  --  0.90  --  1.03  --  0.92  TROPONINIHS 6  --  8  --   --   --   --   --    < > = values in this interval not displayed.    Estimated Creatinine Clearance: 91.5 mL/min (by C-G formula based on SCr of 0.92 mg/dL).   Medical History: Past Medical History:  Diagnosis Date  . Alcohol abuse   . Chronic pain   . COVID-19    a. 02/2019  . HFrEF (heart failure with reduced ejection fraction) ([place])    a. 05/2019 Echo: EF 35-40%.  Marland Kitchen NICM (nonischemic cardiomyopathy) ([place])    a. 05/2019 Echo: EF 35-40%, gr1 DD. Mod enlarged RV. Mildly dil LA. Mild to mod dil RA. Triv AI; b. 05/2019 MV: EF 32%, no ischemia.  . Persistent atrial fibrillation ([place])    a. Dx 05/2019 in setting of PE. CHA2DS2VASc = 1-->Eliquis later changed to xarelto; b. 07/2019 s/p DCCV (150J).  . Pulmonary embolism ([place])    a. 05/2019 CTA Chest: mild amt of PE w/in a lower lobe branch of RPA; b. 08/2019 CTA Chest: Small PE in RML and RLL PA.    Assessment: Pharmacy consulted for Xarelto (Rivaroxaban) dosing for 59 yo male admitted with Right middle and lower lobe  PE/superficial venous thrombosis in the left greater saphenous vein. Patient also has [facility] of A. Fib. Patient currently on heparin infusion.   Plan:  Start Xarelto 15mg  twice daily for 21 days, followed by Xarelto 20mg  daily.   Heparin infusion to stop when first dose of Xarelto is given.    Calvin Esparza, PharmD, BCPS Clinical Pharmacist 09/10/2019 2:18 PM

## 2019-09-10 NOTE — Progress Notes (Signed)
CCMD notified this Rn that pt's heart rate was up. In to see pt and pt was up to the restroom. Pt came in because of increased heart rate and on a Cardizem gtt. After getting back in the bed the heart rate went back down. Will continue to monitor.

## 2019-09-10 NOTE — Progress Notes (Signed)
Patient discharged to home. Discharge instructions give all questions answered. No distress noted.

## 2019-09-10 NOTE — Discharge Instructions (Signed)
Information on my medicine - XARELTO (rivaroxaban)  This medication education was reviewed with me or my healthcare representative as part of my discharge preparation.   WHY WAS XARELTO PRESCRIBED FOR YOU? Xarelto was prescribed to treat blood clots that may have been found in the veins of your legs (deep vein thrombosis) or in your lungs (pulmonary embolism) and to reduce the risk of them occurring again.  What do you need to know about Xarelto? The starting dose is one 15 mg tablet taken TWICE daily with food for the FIRST 21 DAYS then on 10/01/19  the dose is changed to one 20 mg tablet taken ONCE A DAY with your evening meal.  DO NOT stop taking Xarelto without talking to the health care provider who prescribed the medication.  Refill your prescription for 20 mg tablets before you run out.  After discharge, you should have regular check-up appointments with your healthcare provider that is prescribing your Xarelto.  In the future your dose may need to be changed if your kidney function changes by a significant amount.  What do you do if you miss a dose? If you are taking Xarelto TWICE DAILY and you miss a dose, take it as soon as you remember. You may take two 15 mg tablets (total 30 mg) at the same time then resume your regularly scheduled 15 mg twice daily the next day.  If you are taking Xarelto ONCE DAILY and you miss a dose, take it as soon as you remember on the same day then continue your regularly scheduled once daily regimen the next day. Do not take two doses of Xarelto at the same time.   Important Safety Information Xarelto is a blood thinner medicine that can cause bleeding. You should call your healthcare provider right away if you experience any of the following: ? Bleeding from an injury or your nose that does not stop. ? Unusual colored urine (red or dark brown) or unusual colored stools (red or black). ? Unusual bruising for unknown reasons. ? A serious fall or  if you hit your head (even if there is no bleeding).  Some medicines may interact with Xarelto and might increase your risk of bleeding while on Xarelto. To help avoid this, consult your healthcare provider or pharmacist prior to using any new prescription or non-prescription medications, including herbals, vitamins, non-steroidal anti-inflammatory drugs (NSAIDs) and supplements.  This website has more information on Xarelto: [email or web address]-[email or web address].[email or web address]

## 2019-09-10 NOTE — Progress Notes (Signed)
ANTICOAGULATION CONSULT NOTE  Pharmacy Consult for heparin Indication: atrial fibrillation and DVT  No Known Allergies  Patient Measurements: Height: 5\' 10"  (177.8 cm) Weight: 91.5 kg (201 lb 12.8 oz) IBW/kg (Calculated) : 73 Heparin Dosing Weight: 90 kg  Vital Signs: Temp: 98.1 F (36.7 C) (06/25 1933) Temp Source: Oral (06/25 1933) BP: 125/96 (06/25 2208) Pulse Rate: 102 (06/25 2208)  Labs: Recent Labs    09/08/19 1158 09/08/19 1158 09/08/19 1422 09/09/19 0029 09/09/19 0029 09/09/19 0820 09/09/19 1504 09/09/19 2255  HGB 13.3   < >  --  12.6*  --   --  14.1  --   HCT 40.7  --   --  38.4*  --   --  42.1  --   PLT 203  --   --  195  --   --  212  --   APTT 25  --   --   --   --   --   --   --   LABPROT 13.4  --   --   --   --   --   --   --   INR 1.1  --   --   --   --   --   --   --   HEPARINUNFRC <0.10*   < >  --  0.76*   < > 0.71* 0.29* 0.56  CREATININE 0.92  --   --  0.90  --   --  1.03  --   TROPONINIHS 6  --  8  --   --   --   --   --    < > = values in this interval not displayed.    Estimated Creatinine Clearance: 90 mL/min (by C-G formula based on SCr of 1.03 mg/dL).   Medical History: Past Medical History:  Diagnosis Date  . Alcohol abuse   . Chronic pain   . COVID-19    a. 02/2019  . HFrEF (heart failure with reduced ejection fraction) ([place])    a. 05/2019 Echo: EF 35-40%.  Marland Kitchen NICM (nonischemic cardiomyopathy) ([place])    a. 05/2019 Echo: EF 35-40%, gr1 DD. Mod enlarged RV. Mildly dil LA. Mild to mod dil RA. Triv AI; b. 05/2019 MV: EF 32%, no ischemia.  . Persistent atrial fibrillation ([place])    a. Dx 05/2019 in setting of PE. CHA2DS2VASc = 1-->Eliquis later changed to xarelto; b. 07/2019 s/p DCCV (150J).  . Pulmonary embolism ([place])    a. 05/2019 CTA Chest: mild amt of PE w/in a lower lobe branch of RPA; b. 08/2019 CTA Chest: Small PE in RML and RLL PA.   Assessment: 59 year old male presented with SOB, pain and swelling in his left upper leg. Patient is on  Xarelto PTA but has a h/o noncompliance. Patient reports not taking medications for the past week. CT chest positive for small PE in right middle and right lower lobe pulmonary arteries, new. Ultrasound left lower extremity positive for superficial venous thrombosis in the greater saphenous vein throughout almost the entire thigh. Pharmacy consulted for heparin drip.  6/25 0029 HL 0.76, SUPRAtherapeutic (of note there was an issue w/ previous level drawn in ED therefore lab was redrawn)  6/25 0820 HL 0.71 - decrease heparin drip to 1200 units/hr.  6/25 2255 HL 0.56, therapeutic x 1  Goal of Therapy:  Heparin level 0.3-0.7 units/ml Monitor platelets by anticoagulation protocol: Yes   Plan:  Heparin level is therapeutic x 1. Will continue  heparin drip at 1250 units/hr.  Check heparin level in 6 hours to confirm. CBC daily while on heparin.   Pharmacy will continue to follow.   Ena Dawley, PharmD 09/10/2019,12:15 AM

## 2019-09-12 ENCOUNTER — Telehealth: Payer: Self-pay | Admitting: Cardiovascular Disease

## 2019-09-12 NOTE — Telephone Encounter (Signed)
Patient wife calling to check status of below

## 2019-09-12 NOTE — Telephone Encounter (Signed)
Pt c/o medication issue:  1. Name of Medication: Xarelto  2. How are you currently taking this medication (dosage and times per day)? 15 mg bid (day 1-21) (20 mg daily day 22)  3. Are you having a reaction (difficulty breathing--STAT)?   4. What is your medication issue? Cost is too much from the RX sent by the hospital. Patients wife wanting to know if we have a coupon or could write it another way where the cost might be lower after insurance. Patient uses [facility] on [address]

## 2019-09-13 NOTE — Telephone Encounter (Signed)
Can we try to get him samples of 15 mg BID for 3 weeks Then 20 daily

## 2019-09-13 NOTE — Telephone Encounter (Signed)
Spoke with patients wife per release form and she states that the starter pack is over $800. So they have being cutting a 20 mg pill in half until they can figure out how to get this so it was not so expensive. Pharmacy wanted to know if we could write this different so it does not cost so much. Let patient know that I would check with Dr. Rockey Situ regarding dose and that I could probably assist with samples since they will run out too soon. She verbalized understanding and had no further questions at this time.

## 2019-09-14 NOTE — Telephone Encounter (Signed)
Spoke with patients wife and reviewed that I have some samples to get them through the starter pack. She was so appreciative because the cost was unreal. Pharmacy had wanted her to check with [place] about writing it different since it was such a big difference. Samples placed up front and she will try to pick them up today or tomorrow.   Medication Samples have been provided to the patient.  Drug name: Xarelto       Strength: 15 mg        Qty: 6 bottles  LOT: [id number]  Exp.Date: 11/21    Drug name: Xarelto       Strength: 20 mg        Qty: 3 bottles  LOT: [id number]  Exp.Date: 11/21

## 2019-09-14 NOTE — Telephone Encounter (Signed)
Left voicemail message to call back  

## 2019-09-19 NOTE — Progress Notes (Signed)
Cardiology Office Note    Date:  09/21/2019   ID:  Calvin Esparza, DOB 1960-08-13, MRN [id number]  PCP:  [name], MD  Cardiologist:  Ida Rogue, MD  Electrophysiologist:  None   Chief Complaint: Hospital follow up  History of Present Illness:   Calvin Esparza is a 59 y.o. male with history of persistent Afib s/p DCCV in in 5/201 on [facility] with noncompliance, HFrEF secondary to NICM, PE in 05/2019 noncompliant with anticoagulation with recurrent PE in 08/2019, COVID-19 in 02/2019, alcohol abuse, and chronic pain who presents for hospital follow up for recurrent Afib with RVR in the setting of medication noncompliance and new PE from 6/24 through 6/26.   He was diagnosed in with Afib in 03/2018, when he presented with chest pain and dizziness. He was started on beta blocker and Xarelto. He subsequently followed up with Dr. Rockey Situ in 09/2018 and was noted to be in sinus rhythm at that time. He had run out of Xarelto, and given a CHADS2VASc of 1 at that time, this was not resumed. In 02/2019, he was diagnosed in COVID-19, and was noted to be in sinus rhythm. Beta blocker and Eliquis were re-prescribed, though he did not refill these medications. Following COVID infection, he continued to note DOE and chest tightness. In 05/2019, he was seen in the ED and noted to be back in Afib with RVR. CTA chest showed a PE in the lower branch of the right pulmonary artery. Echo showed an EF of 35-40%, Gr1DD, and a moderately enlarged RV. Stress test was nonischemic. He was subsequently changed from Eliquis to Xarelto in an effort to improve patient compliance. He remained in Afib in 07/2019 and subsequently underwent successful DCCV on 07/29/2019. In 08/2019, he suffered a gout flare and was placed on prednisone. With this, he noted abdominal bloating, hunger, and other typical adverse effects of prednisone. In this setting, he discontinued all his medications. With this, he began to notice left thigh pain  and SOB. He presented to the ED on 6/23 and was found to be back in Afib with RVR.  Left lower extremity ultrasound showed a superficial venous thrombosis in the greater saphenous vein throughout almost the entire though. No DVT was noted. He underwent CTA of the chest which showed small PE in the right middle and lower lobe pulmonary arteries. He was subsequently treated with IV heparin with ultimate transition to [place] at discharge. He was rate controlled with beta blocker and calcium channel blocker. Of note, his cardiomyopathy was felt to be tachy-mediated, and he anecdotally responded to diltiazem more than titrated doses of metoprolol.    He comes in doing well from a cardiac perspective.  No chest pain, dyspnea, palpitations, dizziness, presyncope, syncope, lower extremity swelling, abdominal distention, orthopnea, PND, or early satiety.  No falls, hematochezia, or melena.  He has not missed any doses of PE dosed Xarelto and he understands he will need to take his current dose of 15 mg twice daily for a total of 21 days (transitioning to 20 mg daily on 10/03/2019).  He is wearing a boot on the left foot secondary to a "bone spur" and plans to follow-up with podiatry regarding this.  His weight is down 6 pounds today when compared to his last clinic weight.  He does not have any issues or concerns at this time.   Labs independently reviewed: 08/2019 - potassium 3.9, BUN 15, SCr 0.92, HGB 13.3, PLT 180, magnesium 2.2, albumin 3.6,  AST/ALT normal 05/2019 - TSH normal 08/2014 - TC 253, TG 102, HDL 68, LDL 164  Past Medical History:  Diagnosis Date  . Alcohol abuse   . Chronic pain   . COVID-19    a. 02/2019  . HFrEF (heart failure with reduced ejection fraction) ([place])    a. 05/2019 Echo: EF 35-40%.  Marland Kitchen NICM (nonischemic cardiomyopathy) ([place])    a. 05/2019 Echo: EF 35-40%, gr1 DD. Mod enlarged RV. Mildly dil LA. Mild to mod dil RA. Triv AI; b. 05/2019 MV: EF 32%, no ischemia.  . Persistent atrial  fibrillation ([place])    a. Dx 05/2019 in setting of PE. CHA2DS2VASc = 1-->Eliquis later changed to xarelto; b. 07/2019 s/p DCCV (150J).  . Pulmonary embolism ([place])    a. 05/2019 CTA Chest: mild amt of PE w/in a lower lobe branch of RPA; b. 08/2019 CTA Chest: Small PE in RML and RLL PA.    Past Surgical History:  Procedure Laterality Date  . CARDIOVERSION N/A 07/29/2019   Procedure: CARDIOVERSION;  Surgeon: Minna Merritts, MD;  Location: [facility] ORS;  Service: Cardiovascular;  Laterality: N/A;    Current Medications: Current Meds  Medication Sig  . diltiazem (CARDIZEM CD) 240 MG 24 hr capsule Take 1 capsule (240 mg total) by mouth daily.  . furosemide (LASIX) 20 MG tablet Take 1 tablet (20 mg total) by mouth daily.  . metoprolol succinate (TOPROL-XL) 100 MG 24 hr tablet Take 1 tablet (100 mg total) by mouth daily. Take with or immediately following a meal. (Patient taking differently: Take 100 mg by mouth at bedtime. )  . metoprolol succinate (TOPROL-XL) 50 MG 24 hr tablet Take 1 tablet (50 mg total) by mouth daily. Take with or immediately following a meal. (Patient taking differently: Take 50 mg by mouth at bedtime. )  . Rivaroxaban (XARELTO) 15 MG TABS tablet Take 15 mg by mouth in the morning.  Alveda Reasons 20 MG TABS tablet Take 20 mg by mouth daily with supper.     Allergies:   Patient has no known allergies.   Social History   Socioeconomic History  . Marital status: Married    Spouse name: Not on file  . Number of children: Not on file  . Years of education: Not on file  . Highest education level: Not on file  Occupational History  . Not on file  Tobacco Use  . Smoking status: Never Smoker  . Smokeless tobacco: Current User    Types: Chew  Vaping Use  . Vaping Use: Never used  Substance and Sexual Activity  . Alcohol use: Yes    Alcohol/week: 24.0 standard drinks    Types: 24 Cans of beer per week    Comment: 6 beers daily, weekend drinks more-  . Drug use: Not Currently    . Sexual activity: Not on file  Other Topics Concern  . Not on file  Social History Narrative  . Not on file   Social Determinants of Health   Financial Resource Strain:   . Difficulty of Paying Living Expenses:   Food Insecurity:   . Worried About Charity fundraiser in the Last Year:   . Arboriculturist in the Last Year:   Transportation Needs:   . Film/video editor (Medical):   Marland Kitchen Lack of Transportation (Non-Medical):   Physical Activity:   . Days of Exercise per Week:   . Minutes of Exercise per Session:   Stress:   . Feeling of  Stress :   Social Connections:   . Frequency of Communication with Friends and Family:   . Frequency of Social Gatherings with Friends and Family:   . Attends Religious Services:   . Active Member of Clubs or Organizations:   . Attends Archivist Meetings:   Marland Kitchen Marital Status:      Family History:  The patient's family history includes Bone cancer in his father; Diabetes in his sister; Stroke in his father.  ROS:   Review of Systems  Constitutional: Positive for malaise/fatigue. Negative for chills, diaphoresis, fever and weight loss.  HENT: Negative for congestion.   Eyes: Negative for discharge and redness.  Respiratory: Negative for cough, hemoptysis, sputum production, shortness of breath and wheezing.   Cardiovascular: Negative for chest pain, palpitations, orthopnea, claudication, leg swelling and PND.  Gastrointestinal: Negative for abdominal pain, blood in stool, heartburn, melena, nausea and vomiting.  Genitourinary: Negative for hematuria.  Musculoskeletal: Positive for joint pain. Negative for falls and myalgias.       Left foot pain  Skin: Negative for rash.  Neurological: Negative for dizziness, tingling, tremors, sensory change, speech change, focal weakness, loss of consciousness and weakness.  Endo/Heme/Allergies: Does not bruise/bleed easily.  Psychiatric/Behavioral: Negative for substance abuse. The patient  is not nervous/anxious.   All other systems reviewed and are negative.    EKGs/Labs/Other Studies Reviewed:    Studies reviewed were summarized above. The additional studies were reviewed today:  2D echo 05/2019: 1. Left ventricular ejection fraction, by estimation, is 35 to 40%. The  left ventricle has moderately decreased function. The left ventricle  demonstrates global hypokinesis. There is mild left ventricular  hypertrophy. Left ventricular diastolic  parameters are consistent with Grade I diastolic dysfunction (impaired  relaxation).  2. Right ventricular systolic function is mildly reduced. The right  ventricular size is moderately enlarged.  3. Left atrial size was mildly dilated.  4. Right atrial size was mild to moderately dilated.  5. The mitral valve is grossly normal. No evidence of mitral valve  regurgitation.  6. The aortic valve is tricuspid. Aortic valve regurgitation is trivial.  7. The inferior vena cava is normal in size with greater than 50%  respiratory variability, suggesting right atrial pressure of 3 mmHg.   EKG:  EKG is ordered today.  The EKG ordered today demonstrates A. fib, 93 bpm, no acute ST-T changes  Recent Labs: 06/07/2019: TSH 1.823 09/08/2019: B Natriuretic Peptide 669.8 09/09/2019: ALT 30; Magnesium 2.2 09/10/2019: BUN 15; Creatinine, Ser 0.92; Hemoglobin 13.3; Platelets 180; Potassium 3.9; Sodium 139  Recent Lipid Panel No results found for: CHOL, TRIG, HDL, CHOLHDL, VLDL, LDLCALC, LDLDIRECT  PHYSICAL EXAM:    VS:  BP 122/82 (BP Location: Left Arm, Patient Position: Sitting, Cuff Size: Normal)   Pulse 93   Ht 5\' 10"  (1.778 m)   Wt 190 lb (86.2 kg)   SpO2 98%   BMI 27.26 kg/m   BMI: Body mass index is 27.26 kg/m.  Physical Exam Constitutional:      Appearance: He is well-developed.  HENT:     Head: Normocephalic and atraumatic.  Eyes:     General:        Right eye: No discharge.        Left eye: No discharge.  Neck:      Vascular: No JVD.  Cardiovascular:     Rate and Rhythm: Normal rate. Rhythm irregularly irregular.     Pulses: No midsystolic click and no opening snap.  Posterior tibial pulses are 2+ on the right side.     Heart sounds: Normal heart sounds, S1 normal and S2 normal. Heart sounds not distant. No murmur heard.  No friction rub.     Comments: Unable to assess pedal pulse in the left foot secondary to a walking boot Pulmonary:     Effort: Pulmonary effort is normal. No respiratory distress.     Breath sounds: Normal breath sounds. No decreased breath sounds, wheezing or rales.  Chest:     Chest wall: No tenderness.  Abdominal:     General: There is no distension.     Palpations: Abdomen is soft.     Tenderness: There is no abdominal tenderness.  Musculoskeletal:     Cervical back: Normal range of motion.     Comments: Left foot in a walking boot  Skin:    General: Skin is warm and dry.     Nails: There is no clubbing.  Neurological:     Mental Status: He is alert and oriented to person, place, and time.  Psychiatric:        Speech: Speech normal.        Behavior: Behavior normal.        Thought Content: Thought content normal.        Judgment: Judgment normal.     Wt Readings from Last 3 Encounters:  09/21/19 190 lb (86.2 kg)  09/10/19 189 lb 11.2 oz (86 kg)  07/29/19 196 lb (88.9 kg)     ASSESSMENT & PLAN:   1. Persistent A. Fib: He remains in A. fib with controlled ventricular response.  Previously, he has spontaneously converted to sinus rhythm with rate control alone.  In this setting, we will escalate his Cardizem CD 300 mg daily and he will be continued on Toprol-XL 150 mg daily.  CHA2DS2-VASc 2 (CHF, vascular disease).  Continue Xarelto.  He denies missing any doses and is currently taking DVT/PE dosed Xarelto 15 mg twice daily and will continue to do so until 10/03/2019 at which time he will decrease his dose to 20 mg daily.  This was discussed with him in detail  in the office today.  We will see him back in 3 weeks time, if he remains in A. fib at that time, and if he has not missed any doses of [place] we will plan to pursue DCCV at that time.  Check BMP and CBC.  No symptoms concerning for bleeding.  2. HFrEF secondary to NICM: He is doing well, appears euvolemic, and well compensated.  His cardiomyopathy has been felt to be tachycardia mediated in the setting of A. fib with RVR, and in this setting, given noted improvement with ventricular rates on diltiazem more so than metoprolol, we will continue him on Cardizem CD for now with titrated dose to 300 mg daily in an effort to improve rate control.  Weight is down 6 pounds when compared to his last clinic visit.  Continue Toprol-XL.  Repeat echo in several months time after restoring sinus rhythm to reassess LV systolic function.  CHF education.  3. Right middle lobe and lower lobe PE/superficial venous thrombosis in the left greater saphenous vein: He was previously diagnosed with a PE and the lower branch of the right pulmonary artery in 05/2019 and placed on PE dosed Eliquis at that time with subsequent noncompliance.  In this setting, he has had recurrent PE as outlined above.  Remains on Xarelto as outlined above.  Check CBC as above.  He will require ongoing anticoagulation given persistent A. fib as outlined above.  4. Alcohol use: Complete cessation recommended.  This is contributing to his A. fib and also possibly his cardiomyopathy.  Disposition: F/u with Dr. Rockey Situ or an APP in 3 weeks.   Medication Adjustments/Labs and Tests Ordered: Current medicines are reviewed at length with the patient today.  Concerns regarding medicines are outlined above. Medication changes, Labs and Tests ordered today are summarized above and listed in the Patient Instructions accessible in Encounters.   Signed, Christell Faith, PA-C 09/21/2019 9:48 AM     [facility] [address] [place],  [place] [id number] [phone]

## 2019-09-21 ENCOUNTER — Other Ambulatory Visit: Payer: Self-pay

## 2019-09-21 ENCOUNTER — Ambulatory Visit (INDEPENDENT_AMBULATORY_CARE_PROVIDER_SITE_OTHER): Payer: 59 | Admitting: Physician Assistant

## 2019-09-21 ENCOUNTER — Encounter: Payer: Self-pay | Admitting: Physician Assistant

## 2019-09-21 VITALS — BP 122/82 | HR 93 | Ht 70.0 in | Wt 190.0 lb

## 2019-09-21 DIAGNOSIS — I428 Other cardiomyopathies: Secondary | ICD-10-CM | POA: Diagnosis not present

## 2019-09-21 DIAGNOSIS — I5020 Unspecified systolic (congestive) heart failure: Secondary | ICD-10-CM | POA: Diagnosis not present

## 2019-09-21 DIAGNOSIS — F1010 Alcohol abuse, uncomplicated: Secondary | ICD-10-CM

## 2019-09-21 DIAGNOSIS — I4891 Unspecified atrial fibrillation: Secondary | ICD-10-CM

## 2019-09-21 DIAGNOSIS — I4819 Other persistent atrial fibrillation: Secondary | ICD-10-CM

## 2019-09-21 DIAGNOSIS — I2694 Multiple subsegmental pulmonary emboli without acute cor pulmonale: Secondary | ICD-10-CM

## 2019-09-21 MED ORDER — DILTIAZEM HCL ER COATED BEADS 300 MG PO CP24: 300.0000 mg | ORAL_CAPSULE | Freq: Every day | ORAL | 2 refills | Status: DC

## 2019-09-21 NOTE — Patient Instructions (Signed)
Medication Instructions:  Your physician has recommended you make the following change in your medication:   INCREASE Cardizem to 300 mg daily. An Rx has been sent to your pharmacy.  *If you need a refill on your cardiac medications before your next appointment, please call your pharmacy*   Lab Work: Bmet and Cbc today. If you have labs (blood work) drawn today and your tests are completely normal, you will receive your results only by: Marland Kitchen MyChart Message (if you have MyChart) OR . A paper copy in the mail If you have any lab test that is abnormal or we need to change your treatment, we will call you to review the results.   Testing/Procedures: None ordered   Follow-Up: At [facility], you and your health needs are our priority.  As part of our continuing mission to provide you with exceptional heart care, we have created designated Provider Care Teams.  These Care Teams include your primary Cardiologist (physician) and Advanced Practice Providers (APPs -  Physician Assistants and Nurse Practitioners) who all work together to provide you with the care you need, when you need it.  We recommend signing up for the patient portal called "MyChart".  Sign up information is provided on this After Visit Summary.  MyChart is used to connect with patients for Virtual Visits (Telemedicine).  Patients are able to view lab/test results, encounter notes, upcoming appointments, etc.  Non-urgent messages can be sent to your provider as well.   To learn more about what you can do with MyChart, go to [email or web address].    Your next appointment:   3 week(s)  The format for your next appointment:   In Person  Provider:    You may see Ida Rogue, MD or one of the following Advanced Practice Providers on your designated Care Team:    Murray Hodgkins, NP  Christell Faith, PA-C  Marrianne Mood, PA-C    Other Instructions N/A

## 2019-09-22 LAB — CBC WITH DIFFERENTIAL/PLATELET
Basophils Absolute: 0 10*3/uL (ref 0.0–0.2)
Basos: 1 %
EOS (ABSOLUTE): 0.1 10*3/uL (ref 0.0–0.4)
Eos: 1 %
Hematocrit: 46.9 % (ref 37.5–51.0)
Hemoglobin: 15.4 g/dL (ref 13.0–17.7)
Immature Grans (Abs): 0 10*3/uL (ref 0.0–0.1)
Immature Granulocytes: 0 %
Lymphocytes Absolute: 1.9 10*3/uL (ref 0.7–3.1)
Lymphs: 34 %
MCH: 31.3 pg (ref 26.6–33.0)
MCHC: 32.8 g/dL (ref 31.5–35.7)
MCV: 95 fL (ref 79–97)
Monocytes Absolute: 0.4 10*3/uL (ref 0.1–0.9)
Monocytes: 8 %
Neutrophils Absolute: 3.1 10*3/uL (ref 1.4–7.0)
Neutrophils: 56 %
Platelets: 201 10*3/uL (ref 150–450)
RBC: 4.92 x10E6/uL (ref 4.14–5.80)
RDW: 13.5 % (ref 11.6–15.4)
WBC: 5.5 10*3/uL (ref 3.4–10.8)

## 2019-09-22 LAB — BASIC METABOLIC PANEL
BUN/Creatinine Ratio: 18 (ref 9–20)
BUN: 17 mg/dL (ref 6–24)
CO2: 25 mmol/L (ref 20–29)
Calcium: 9.7 mg/dL (ref 8.7–10.2)
Chloride: 103 mmol/L (ref 96–106)
Creatinine, Ser: 0.97 mg/dL (ref 0.76–1.27)
GFR calc Af Amer: 100 mL/min/{1.73_m2} (ref >59)
GFR calc non Af Amer: 86 mL/min/{1.73_m2} (ref >59)
Glucose: 106 mg/dL — ABNORMAL HIGH (ref 65–99)
Potassium: 4.4 mmol/L (ref 3.5–5.2)
Sodium: 143 mmol/L (ref 134–144)

## 2019-09-26 ENCOUNTER — Telehealth: Payer: Self-pay | Admitting: Family

## 2019-09-26 NOTE — Telephone Encounter (Signed)
Spoke to patient who said he is doing well since discharged from the hospital. He is having minor joint pain but otherwise ok. He is following a low sodium diet and checking weight daily with no symptoms or complaints to report. He confirmed his new patient CHF Clinic appointment with [place] for 7/29.   Lenvil Swaim, NT

## 2019-09-28 ENCOUNTER — Telehealth: Payer: Self-pay | Admitting: Cardiovascular Disease

## 2019-09-28 NOTE — Telephone Encounter (Signed)
Patient spouse calling  States another provider started patient on new medication  Pharmacy informed patient that it may have a reaction with diltiazem - would like to know what to do  Please call to discuss

## 2019-09-28 NOTE — Telephone Encounter (Signed)
Patients wife called because he was placed on colchicine which interacts with diltiazem. They are holding the diltiazem and taking the colchicine for now but wanted to know if this would be a problem or if he should be on something else. Let her know that I would forward this over to provider for his review and recommendations and would be in touch with her. She verbalized understanding with no further questions at this time.

## 2019-09-29 NOTE — Telephone Encounter (Signed)
Verbal discussion with Dr. Rockey Situ on 09/29/19 about taking both Diltiazem & Colchicine and that both can be taken together. Patients wife called back to check on this and updated her on provider recommendations. She was appreciative for the call with no further questions at this time.

## 2019-10-02 NOTE — Telephone Encounter (Signed)
Would actually prefer not to mix the diltiazem and colchicine If he is not on the colchicine we can continue the diltiazem If he plans on taking colchicine even as needed here and therefore gout breakthrough, Would recommend he stop the diltiazem and increase metoprolol succinate up to 150 twice a day

## 2019-10-03 NOTE — Telephone Encounter (Signed)
Patient wife calling

## 2019-10-03 NOTE — Telephone Encounter (Signed)
Left voicemail message to call back  

## 2019-10-04 MED ORDER — METOPROLOL SUCCINATE ER 50 MG PO TB24: 50.0000 mg | ORAL_TABLET | Freq: Two times a day (BID) | ORAL | 3 refills | Status: DC

## 2019-10-04 MED ORDER — METOPROLOL SUCCINATE ER 100 MG PO TB24: 100.0000 mg | ORAL_TABLET | Freq: Two times a day (BID) | ORAL | 3 refills | Status: DC

## 2019-10-04 NOTE — Telephone Encounter (Signed)
Patients wife returning call back. Reviewed provider recommendations to stop the diltiazem and increase metoprolol to 150 mg twice a day. Patients wife was concerned about the increased dose and reviewed that with stopping the Diltiazem this increased dosage will cover for that. Instructed her to have him monitor blood pressures and heart rates 2 hours after medications and keep a log of his readings to bring to upcoming appointment next week with Dr. Rockey Situ. Reviewed that if she should have any concerns or questions to call back. She verbalized understanding of our conversation, agreement with plan, and no further questions at this time. Confirmed appointments next week for both HF clinic and seeing Dr. Rockey Situ. She was appreciative for the call.

## 2019-10-10 ENCOUNTER — Observation Stay
Admission: EM | Admit: 2019-10-10 | Discharge: 2019-10-11 | Disposition: A | Discharge status: Home / Self Care | Payer: 59 | Attending: Family Medicine | Admitting: Family Medicine

## 2019-10-10 ENCOUNTER — Encounter: Payer: Self-pay | Admitting: Emergency Medicine

## 2019-10-10 ENCOUNTER — Other Ambulatory Visit: Payer: Self-pay

## 2019-10-10 ENCOUNTER — Emergency Department: Payer: 59

## 2019-10-10 DIAGNOSIS — R531 Weakness: Secondary | ICD-10-CM | POA: Diagnosis present

## 2019-10-10 DIAGNOSIS — I5042 Chronic combined systolic (congestive) and diastolic (congestive) heart failure: Secondary | ICD-10-CM | POA: Diagnosis not present

## 2019-10-10 DIAGNOSIS — I4891 Unspecified atrial fibrillation: Principal | ICD-10-CM | POA: Diagnosis present

## 2019-10-10 DIAGNOSIS — I5023 Acute on chronic systolic (congestive) heart failure: Secondary | ICD-10-CM | POA: Diagnosis not present

## 2019-10-10 DIAGNOSIS — R0602 Shortness of breath: Secondary | ICD-10-CM | POA: Diagnosis not present

## 2019-10-10 DIAGNOSIS — R55 Syncope and collapse: Secondary | ICD-10-CM | POA: Diagnosis not present

## 2019-10-10 DIAGNOSIS — Z7901 Long term (current) use of anticoagulants: Secondary | ICD-10-CM | POA: Insufficient documentation

## 2019-10-10 DIAGNOSIS — R079 Chest pain, unspecified: Secondary | ICD-10-CM | POA: Insufficient documentation

## 2019-10-10 DIAGNOSIS — I5043 Acute on chronic combined systolic (congestive) and diastolic (congestive) heart failure: Secondary | ICD-10-CM | POA: Diagnosis present

## 2019-10-10 DIAGNOSIS — N179 Acute kidney failure, unspecified: Secondary | ICD-10-CM | POA: Insufficient documentation

## 2019-10-10 DIAGNOSIS — I4819 Other persistent atrial fibrillation: Secondary | ICD-10-CM | POA: Diagnosis not present

## 2019-10-10 DIAGNOSIS — I110 Hypertensive heart disease with heart failure: Secondary | ICD-10-CM | POA: Diagnosis not present

## 2019-10-10 DIAGNOSIS — Z79899 Other long term (current) drug therapy: Secondary | ICD-10-CM | POA: Insufficient documentation

## 2019-10-10 DIAGNOSIS — F1010 Alcohol abuse, uncomplicated: Secondary | ICD-10-CM | POA: Diagnosis present

## 2019-10-10 DIAGNOSIS — Z20822 Contact with and (suspected) exposure to covid-19: Secondary | ICD-10-CM | POA: Insufficient documentation

## 2019-10-10 LAB — CBC
HCT: 41.9 % (ref 39.0–52.0)
Hemoglobin: 14.2 g/dL (ref 13.0–17.0)
MCH: 31.6 pg (ref 26.0–34.0)
MCHC: 33.9 g/dL (ref 30.0–36.0)
MCV: 93.1 fL (ref 80.0–100.0)
Platelets: 204 10*3/uL (ref 150–400)
RBC: 4.5 MIL/uL (ref 4.22–5.81)
RDW: 13.4 % (ref 11.5–15.5)
WBC: 7.5 10*3/uL (ref 4.0–10.5)
nRBC: 0 % (ref 0.0–0.2)

## 2019-10-10 LAB — BASIC METABOLIC PANEL
Anion gap: 14 (ref 5–15)
BUN: 24 mg/dL — ABNORMAL HIGH (ref 6–20)
CO2: 20 mmol/L — ABNORMAL LOW (ref 22–32)
Calcium: 9.9 mg/dL (ref 8.9–10.3)
Chloride: 104 mmol/L (ref 98–111)
Creatinine, Ser: 1.81 mg/dL — ABNORMAL HIGH (ref 0.61–1.24)
GFR calc Af Amer: 47 mL/min — ABNORMAL LOW (ref >60)
GFR calc non Af Amer: 41 mL/min — ABNORMAL LOW (ref >60)
Glucose, Bld: 128 mg/dL — ABNORMAL HIGH (ref 70–99)
Potassium: 4.3 mmol/L (ref 3.5–5.1)
Sodium: 138 mmol/L (ref 135–145)

## 2019-10-10 LAB — MAGNESIUM: Magnesium: 2 mg/dL (ref 1.7–2.4)

## 2019-10-10 LAB — BRAIN NATRIURETIC PEPTIDE: B Natriuretic Peptide: 1007.1 pg/mL — ABNORMAL HIGH (ref 0.0–100.0)

## 2019-10-10 LAB — TROPONIN I (HIGH SENSITIVITY)
Troponin I (High Sensitivity): 5 ng/L (ref <18)
Troponin I (High Sensitivity): 4 ng/L (ref <18)

## 2019-10-10 LAB — SARS CORONAVIRUS 2 BY RT PCR (HOSPITAL ORDER, PERFORMED IN ~~LOC~~ HOSPITAL LAB): SARS Coronavirus 2: NEGATIVE

## 2019-10-10 MED ORDER — ADULT MULTIVITAMIN W/MINERALS CH
1.0000 | ORAL_TABLET | Freq: Every day | ORAL | Status: DC
  Administered 2019-10-10 – 2019-10-11 (×2): 1 via ORAL
  Filled 2019-10-10 (×2): qty 1

## 2019-10-10 MED ORDER — LACTATED RINGERS IV BOLUS
1000.0000 mL | Freq: Once | INTRAVENOUS | Status: AC
  Administered 2019-10-10: 1000 mL via INTRAVENOUS

## 2019-10-10 MED ORDER — DILTIAZEM HCL 25 MG/5ML IV SOLN
10.0000 mg | Freq: Once | INTRAVENOUS | Status: AC
  Administered 2019-10-10: 10 mg via INTRAVENOUS
  Filled 2019-10-10: qty 5

## 2019-10-10 MED ORDER — FOLIC ACID 1 MG PO TABS
1.0000 mg | ORAL_TABLET | Freq: Every day | ORAL | Status: DC
  Administered 2019-10-10 – 2019-10-11 (×2): 1 mg via ORAL
  Filled 2019-10-10 (×2): qty 1

## 2019-10-10 MED ORDER — LORAZEPAM 2 MG/ML IJ SOLN: 1.0000 mg | INTRAMUSCULAR | Status: DC | PRN

## 2019-10-10 MED ORDER — DILTIAZEM HCL-DEXTROSE 125-5 MG/125ML-% IV SOLN (PREMIX)
5.0000 mg/h | INTRAVENOUS | Status: DC
  Administered 2019-10-10 – 2019-10-11 (×2): 5 mg/h via INTRAVENOUS
  Filled 2019-10-10: qty 125

## 2019-10-10 MED ORDER — THIAMINE HCL 100 MG/ML IJ SOLN: 100.0000 mg | Freq: Every day | INTRAMUSCULAR | Status: DC

## 2019-10-10 MED ORDER — SODIUM CHLORIDE 0.9% FLUSH: 3.0000 mL | Freq: Once | INTRAVENOUS | Status: DC

## 2019-10-10 MED ORDER — ONDANSETRON HCL 4 MG/2ML IJ SOLN: 4.0000 mg | Freq: Four times a day (QID) | INTRAMUSCULAR | Status: DC | PRN

## 2019-10-10 MED ORDER — RIVAROXABAN 20 MG PO TABS
20.0000 mg | ORAL_TABLET | Freq: Every day | ORAL | Status: DC
  Filled 2019-10-10 (×3): qty 1

## 2019-10-10 MED ORDER — THIAMINE HCL 100 MG PO TABS
100.0000 mg | ORAL_TABLET | Freq: Every day | ORAL | Status: DC
  Administered 2019-10-10 – 2019-10-11 (×2): 100 mg via ORAL
  Filled 2019-10-10 (×2): qty 1

## 2019-10-10 MED ORDER — ACETAMINOPHEN 325 MG PO TABS: 650.0000 mg | ORAL_TABLET | ORAL | Status: DC | PRN

## 2019-10-10 MED ORDER — LORAZEPAM 1 MG PO TABS: 1.0000 mg | ORAL_TABLET | ORAL | Status: DC | PRN

## 2019-10-10 NOTE — ED Provider Notes (Signed)
[facility] Emergency Department Provider Note   ____________________________________________   First MD Initiated Contact with Patient 10/10/19 1424     (approximate)  I have reviewed the triage vital signs and the nursing notes.   HISTORY  Chief Complaint Near Syncope    HPI Calvin Esparza is a 59 y.o. male with past medical history of persistent atrial fibrillation on Xarelto, CHF, and PE who presents to the ED complaining of near syncope.  Patient reports that he first noticed his heart was racing yesterday when he checked his heart rate and found to be 124.  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  He states he felt lightheaded and like he was going to pass out, with weakness in both of his legs.  He was able to rest and felt slightly better, but when he resumed work had similar symptoms.  This was associated with some tightness in his chest as well as some mild difficulty breathing.  He continues to feel weaker than usual although his chest pain and shortness of breath have resolved.  He has otherwise been feeling well recently with no fevers or cough.  He states he has been taking his medications as prescribed up until this morning, when he had not yet had a chance to take his Xarelto or usual atrial fibrillation medications.        Past Medical History:  Diagnosis Date  . Alcohol abuse   . Chronic pain   . COVID-19    a. 02/2019  . HFrEF (heart failure with reduced ejection fraction) ([place])    a. 05/2019 Echo: EF 35-40%.  Marland Kitchen NICM (nonischemic cardiomyopathy) ([place])    a. 05/2019 Echo: EF 35-40%, gr1 DD. Mod enlarged RV. Mildly dil LA. Mild to mod dil RA. Triv AI; b. 05/2019 MV: EF 32%, no ischemia.  . Persistent atrial fibrillation ([place])    a. Dx 05/2019 in setting of PE. CHA2DS2VASc = 1-->Eliquis later changed to xarelto; b. 07/2019 s/p DCCV (150J).  . Pulmonary embolism ([place])    a. 05/2019 CTA Chest:  mild amt of PE w/in a lower lobe branch of RPA; b. 08/2019 CTA Chest: Small PE in RML and RLL PA.    Patient Active Problem List   Diagnosis Date Noted  . Acute on chronic HFrEF (heart failure with reduced ejection fraction) ([place])   . Persistent atrial fibrillation ([place])   . Dilated cardiomyopathy ([place]) 06/09/2019  . Chronic combined systolic and diastolic CHF (congestive heart failure) ([place]) 06/08/2019  . Acute pulmonary embolism ([place]) 06/07/2019  . Atrial fibrillation with RVR ([place]) 10/01/2018  . Alcohol abuse 10/01/2018    Past Surgical History:  Procedure Laterality Date  . CARDIOVERSION N/A 07/29/2019   Procedure: CARDIOVERSION;  Surgeon: Minna Merritts, MD;  Location: [facility] ORS;  Service: Cardiovascular;  Laterality: N/A;    Prior to Admission medications   Medication Sig Start Date End Date Taking? Authorizing Provider  furosemide (LASIX) 20 MG tablet Take 1 tablet (20 mg total) by mouth daily. 09/10/19 10/10/19 Yes Mikhail, Maryann, DO  colchicine-probenecid 0.5-500 MG tablet Take 1 tablet by mouth 2 (two) times daily. 09/26/19   [provider]  metoprolol succinate (TOPROL-XL) 100 MG 24 hr tablet Take 1 tablet (100 mg total) by mouth in the morning and at bedtime. Take with or immediately following a meal. 10/04/19   Gollan, Kathlene November, MD  metoprolol succinate (TOPROL-XL) 50 MG 24 hr tablet Take  1 tablet (50 mg total) by mouth in the morning and at bedtime. Take with or immediately following a meal. 10/04/19   Gollan, Kathlene November, MD  Rivaroxaban (XARELTO) 15 MG TABS tablet Take 15 mg by mouth in the morning.    [provider]  XARELTO 20 MG TABS tablet Take 20 mg by mouth daily with supper.  09/10/19   [provider]    Allergies Patient has no known allergies.  Family History  Problem Relation Age of Onset  . Bone cancer Father   . Stroke Father   . Diabetes Sister     Social History Social History   Tobacco Use  . Smoking status: Never  Smoker  . Smokeless tobacco: Current User    Types: Chew  Vaping Use  . Vaping Use: Never used  Substance Use Topics  . Alcohol use: Yes    Alcohol/week: 24.0 standard drinks    Types: 24 Cans of beer per week    Comment: 6 beers daily, weekend drinks more-  . Drug use: Not Currently    Review of Systems  Constitutional: No fever/chills Eyes: No visual changes. ENT: No sore throat. Cardiovascular: Positive for palpitations and chest pain.  Positive for lightheadedness and near syncope. Respiratory: Positive for shortness of breath. Gastrointestinal: No abdominal pain.  No nausea, no vomiting.  No diarrhea.  No constipation. Genitourinary: Negative for dysuria. Musculoskeletal: Negative for back pain. Skin: Negative for rash. Neurological: Negative for headaches, focal weakness or numbness.  ____________________________________________   PHYSICAL EXAM:  VITAL SIGNS: ED Triage Vitals  Enc Vitals Group     BP 10/10/19 1338 125/73     Pulse Rate 10/10/19 1338 (!) 133     Resp 10/10/19 1338 16     Temp 10/10/19 1338 97.9 F (36.6 C)     Temp Source 10/10/19 1338 Oral     SpO2 10/10/19 1338 99 %     Weight 10/10/19 1341 195 lb (88.5 kg)     Height 10/10/19 1341 5\' 9"  (1.753 m)     Head Circumference --      Peak Flow --      Pain Score 10/10/19 1341 0     Pain Loc --      Pain Edu? --      Excl. in [place]? --     Constitutional: Alert and oriented. Eyes: Conjunctivae are normal. Head: Atraumatic. Nose: No congestion/rhinnorhea. Mouth/Throat: Mucous membranes are moist. Neck: Normal ROM Cardiovascular: Tachycardic, irregularly irregular rhythm. Grossly normal heart sounds. Respiratory: Normal respiratory effort.  No retractions. Lungs CTAB. Gastrointestinal: Soft and nontender. No distention. Genitourinary: deferred Musculoskeletal: No lower extremity tenderness nor edema. Neurologic:  Normal speech and language. No gross focal neurologic deficits are  appreciated. Skin:  Skin is warm, dry and intact. No rash noted. Psychiatric: Mood and affect are normal. Speech and behavior are normal.  ____________________________________________   LABS (all labs ordered are listed, but only abnormal results are displayed)  Labs Reviewed  BASIC METABOLIC PANEL - Abnormal; Notable for the following components:      Result Value   CO2 20 (*)    Glucose, Bld 128 (*)    BUN 24 (*)    Creatinine, Ser 1.81 (*)    GFR calc non Af Amer 41 (*)    GFR calc Af Amer 47 (*)    All other components within normal limits  SARS CORONAVIRUS 2 BY RT PCR (HOSPITAL ORDER, [facility])  CBC  MAGNESIUM  TROPONIN I (HIGH SENSITIVITY)  TROPONIN I (HIGH SENSITIVITY)   ____________________________________________  EKG  ED ECG REPORT I, Blake Divine, the attending physician, personally viewed and interpreted this ECG.   Date: 10/10/2019  EKG Time: 13:37  Rate: 134  Rhythm: atrial fibrillation, rate 134  Axis: Normal  Intervals:none  ST&T Change: None   PROCEDURES  Procedure(s) performed (including Critical Care):  .Critical Care Performed by: Blake Divine, MD Authorized by: Blake Divine, MD   Critical care provider statement:    Critical care time (minutes):  45   Critical care time was exclusive of:  Separately billable procedures and treating other patients and teaching time   Critical care was necessary to treat or prevent imminent or life-threatening deterioration of the following conditions:  Cardiac failure   Critical care was time spent personally by me on the following activities:  Discussions with consultants, evaluation of patient's response to treatment, examination of patient, ordering and performing treatments and interventions, ordering and review of laboratory studies, ordering and review of radiographic studies, pulse oximetry, re-evaluation of patient's condition, obtaining history from patient or  surrogate and review of old charts   I assumed direction of critical care for this patient from another provider in my specialty: no       ____________________________________________   INITIAL IMPRESSION / ASSESSMENT AND PLAN / ED COURSE       59 year old male with past medical history of persistent atrial fibrillation on Xarelto, CHF, and PE who presents to the ED complaining of palpitations and near syncope while at work earlier today.  He had some chest pain and shortness of breath prior to arrival, but this has since resolved.  Initial EKG shows atrial fibrillation with RVR but no acute ischemic changes.  Lab work also concerning for AKI, potentially due to recent overdiuresis.  Patient was hydrated with IV fluids and started on Cardizem drip with improved control of his heart rate.  Case discussed with hospitalist for admission and there was concern about starting patient on Cardizem drip due to his EF of 35 to 40%.  Case discussed with Dr. Fletcher Anon of cardiology, who states patient may be on Cardizem drip for short-term basis.  Cardiology to see the patient in consultation.      ____________________________________________   FINAL CLINICAL IMPRESSION(S) / ED DIAGNOSES  Final diagnoses:  Atrial fibrillation with RVR ([place])  AKI (acute kidney injury) [facility])     ED Discharge Orders    None       Note:  This document was prepared using Dragon voice recognition software and may include unintentional dictation errors.   Blake Divine, MD 10/10/19 [id number]

## 2019-10-10 NOTE — ED Notes (Signed)
Patient transported to CT 

## 2019-10-10 NOTE — ED Triage Notes (Signed)
Says he was at work and he felt faint and then couldn't see.  Then his legs went, but he never lost consciousness.  Says this happened twice last week.  Has told his cardiologist about that.

## 2019-10-10 NOTE — Progress Notes (Signed)
Patient now states he would want a DCCV if needed in the morning. He has been made NPO at midnight and if he remains in Afib on 10/11/2019, we can discuss possible DCCV at that time.

## 2019-10-10 NOTE — ED Notes (Signed)
Family had brought pt dinner.  Pt aware cannot eat or drink after midnight.

## 2019-10-10 NOTE — H&P (Signed)
History and Physical    Calvin Esparza:[id number] DOB: 04-22-1960 DOA: 10/10/2019  PCP: [name], MD Patient coming from: Home  Chief Complaint: Feeling weak  HPI: Calvin Esparza is a 59 y.o. male with medical history significant of PE, COVID-19, alcohol abuse, HFrEF, persistent atrial fibrillation. 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 He reports having blurry vision and leg weakness causing him to sit down on the ground. He stopped taking his lasix about one week ago. He did not take his metoprolol this morning.  ED Course: Vitals: Afebrile, pulse of 133, RR of 16, BP of 125/73, SpO2 of 99% on room air Labs: BUN of 24, Creatinine of 1.81, troponin of 5>>4 Imaging: CXR without acute issue. Medications/Course: LR IV bolus, diltiazem bolus and drip  Review of Systems: Review of Systems  Constitutional: Negative for chills and fever.  Eyes: Positive for blurred vision.  Respiratory: Positive for shortness of breath.   Cardiovascular: Positive for chest pain (tightness), orthopnea and PND. Negative for leg swelling.  Gastrointestinal: Negative for abdominal pain, constipation, diarrhea, nausea and vomiting.  Neurological: Positive for weakness.  All other systems reviewed and are negative.   Past Medical History:  Diagnosis Date  . Alcohol abuse   . Chronic pain   . COVID-19    a. 02/2019  . HFrEF (heart failure with reduced ejection fraction) ([place])    a. 05/2019 Echo: EF 35-40%.  Marland Kitchen NICM (nonischemic cardiomyopathy) ([place])    a. 05/2019 Echo: EF 35-40%, gr1 DD. Mod enlarged RV. Mildly dil LA. Mild to mod dil RA. Triv AI; b. 05/2019 MV: EF 32%, no ischemia.  . Persistent atrial fibrillation ([place])    a. Dx 05/2019 in setting of PE. CHA2DS2VASc = 1-->Eliquis later changed to xarelto; b. 07/2019 s/p DCCV (150J).  . Pulmonary embolism ([place])    a. 05/2019 CTA Chest: mild amt of PE w/in a lower lobe branch of RPA; b.  08/2019 CTA Chest: Small PE in RML and RLL PA.    Past Surgical History:  Procedure Laterality Date  . CARDIOVERSION N/A 07/29/2019   Procedure: CARDIOVERSION;  Surgeon: Minna Merritts, MD;  Location: [facility] ORS;  Service: Cardiovascular;  Laterality: N/A;     reports that he has never smoked. His smokeless tobacco use includes chew. He reports current alcohol use of about 24.0 standard drinks of alcohol per week. He reports previous drug use.  No Known Allergies  Family History  Problem Relation Age of Onset  . Bone cancer Father   . Stroke Father   . Diabetes Sister    Prior to Admission medications   Medication Sig Start Date End Date Taking? Authorizing Provider  furosemide (LASIX) 20 MG tablet Take 1 tablet (20 mg total) by mouth daily. 09/10/19 10/10/19  Cristal Ford, DO  metoprolol succinate (TOPROL-XL) 100 MG 24 hr tablet Take 1 tablet (100 mg total) by mouth in the morning and at bedtime. Take with or immediately following a meal. 10/04/19   Gollan, Kathlene November, MD  metoprolol succinate (TOPROL-XL) 50 MG 24 hr tablet Take 1 tablet (50 mg total) by mouth in the morning and at bedtime. Take with or immediately following a meal. 10/04/19   Gollan, Kathlene November, MD  Rivaroxaban (XARELTO) 15 MG TABS tablet Take 15 mg by mouth in the morning.    [provider]  XARELTO 20 MG TABS tablet Take 20 mg by mouth daily with supper.  09/10/19   [provider]    Physical Exam:  Physical Exam Constitutional:      General: He is not in acute distress.    Appearance: He is well-developed. He is not diaphoretic.  Eyes:     Conjunctiva/sclera: Conjunctivae normal.     Pupils: Pupils are equal, round, and reactive to light.  Cardiovascular:     Rate and Rhythm: Regular rhythm. Tachycardia present.     Heart sounds: Normal heart sounds. No murmur heard.   Pulmonary:     Effort: Pulmonary effort is normal. No respiratory distress.     Breath sounds: Normal breath sounds. No  wheezing or rales.  Abdominal:     General: Bowel sounds are normal. There is no distension.     Palpations: Abdomen is soft.     Tenderness: There is no abdominal tenderness. There is no guarding or rebound.  Musculoskeletal:        General: No swelling or tenderness. Normal range of motion.     Cervical back: Normal range of motion.     Right lower leg: No edema.     Left lower leg: No edema.  Lymphadenopathy:     Cervical: No cervical adenopathy.  Skin:    General: Skin is warm and dry.  Neurological:     Mental Status: He is alert and oriented to person, place, and time.     Labs on Admission: I have personally reviewed following labs and imaging studies  CBC: Recent Labs  Lab 10/10/19 1345  WBC 7.5  HGB 14.2  HCT 41.9  MCV 93.1  PLT 852    Basic Metabolic Panel: Recent Labs  Lab 10/10/19 1345  NA 138  K 4.3  CL 104  CO2 20*  GLUCOSE 128*  BUN 24*  CREATININE 1.81*  CALCIUM 9.9  MG 2.0    GFR: Estimated Creatinine Clearance: 49.6 mL/min (A) (by C-G formula based on SCr of 1.81 mg/dL (H)).  Liver Function Tests: No results for input(s): AST, ALT, ALKPHOS, BILITOT, PROT, ALBUMIN in the last 168 hours. No results for input(s): LIPASE, AMYLASE in the last 168 hours. No results for input(s): AMMONIA in the last 168 hours.  Coagulation Profile: No results for input(s): INR, PROTIME in the last 168 hours.  Cardiac Enzymes: No results for input(s): CKTOTAL, CKMB, CKMBINDEX, TROPONINI in the last 168 hours.  BNP (last 3 results) No results for input(s): PROBNP in the last 8760 hours.  HbA1C: No results for input(s): HGBA1C in the last 72 hours.  CBG: No results for input(s): GLUCAP in the last 168 hours.  Lipid Profile: No results for input(s): CHOL, HDL, LDLCALC, TRIG, CHOLHDL, LDLDIRECT in the last 72 hours.  Thyroid Function Tests: No results for input(s): TSH, T4TOTAL, FREET4, T3FREE, THYROIDAB in the last 72 hours.  Anemia Panel: No results  for input(s): VITAMINB12, FOLATE, FERRITIN, TIBC, IRON, RETICCTPCT in the last 72 hours.  Urine analysis: No results found for: COLORURINE, APPEARANCEUR, LABSPEC, [place], GLUCOSEU, HGBUR, BILIRUBINUR, KETONESUR, PROTEINUR, UROBILINOGEN, NITRITE, LEUKOCYTESUR   Radiological Exams on Admission: DG Chest 2 View  Result Date: 10/10/2019 CLINICAL DATA:  Near syncope. Additional provided: History of atrial fibrillation, history of PE, smoker EXAM: CHEST - 2 VIEW COMPARISON:  CT angiogram chest 09/08/2019, chest radiograph 09/08/2019 FINDINGS: Heart size within normal limits. Mild aneurysmal dilatation of the ascending aorta was better appreciated on CT angiogram chest 09/08/2019. There is no appreciable airspace consolidation or pulmonary edema. No evidence of pleural effusion or pneumothorax. No  acute bony abnormality identified. Thoracic spondylosis. IMPRESSION: No evidence of acute cardiopulmonary abnormality. Mild aneurysmal dilatation of the ascending aorta was better appreciated on prior CTA chest 09/08/2019. Electronically Signed   By: Kellie Simmering DO   On: 10/10/2019 14:57    EKG: Independently reviewed. Afib. Tachycardia  Assessment/Plan Principal Problem:   Atrial fibrillation with RVR (HCC) Active Problems:   Alcohol abuse   Chronic combined systolic and diastolic CHF (congestive heart failure) (HCC)   Persistent atrial fibrillation (HCC)   Pre-syncope  Presyncope Appears likely related to atrial fibrillation with RVR in setting of reduced EF heart failure in addition to dehydration. Recent Transthoracic Echocardiogram from 06/08/19 without significant valvular dysfunctions. -Telemetry -Will defer repeat Transthoracic Echocardiogram to cardiology -PT eval  Persistent atrial fibrillation with RVR In setting of medication non-adherence but also appears this has been difficult to control as an outpatient. Patient is on Xarelto and metoprolol.  -Cardiology recommendations: Diltiazem  IV, Cardioversion on 7/27  HFrEF Patient is currently euvolemic. Patient has been non-adherent with lasix secondary to feeling dehydrated. -Hold lasix since he is euvolemic with concern for dehydration -BNP  AKI Possibly secondary to dehydration. IV fluids given in ED. Baseline creatinine of 0.9, creatinine of 1.8 on admission -AM BMP -Oral hydration overnight -Hold Lasix  History of pulmonary embolism Patient is on Xarelto. Patient states he is taking 15 mg BID but this dosing should have completed on 7/17 -Xarelto 20 mg daily  Alcohol abuse At least 6 beers daily, sometimes in AM on weekends -CIWA   DVT prophylaxis: Xarelto Code Status: Full code Family Communication: None at bedside Disposition Plan: Anticipate discharge home in 1-3 days Consults called: Cardiology Admission status: Observation   Cordelia Poche, MD [facility] 10/10/2019, 3:15 PM

## 2019-10-10 NOTE — Consult Note (Addendum)
Cardiology Consultation:   Patient ID: Calvin Esparza; [id number]; October 04, 1960   Admit date: 10/10/2019 Date of Consult: 10/10/2019  Primary Care Provider: [name], MD Primary Cardiologist: Rockey Situ Primary Electrophysiologist:  None   Patient Profile:   Calvin Esparza is a 59 y.o. male with a hx of persistent Afib s/p DCCV in 07/2019 on [place] with noncompliance, HFrEF secondary to NICM, PE in 05/2019 noncompliant with anticoagulation with recurrent PE in 08/2019, COVID-19 in 02/2019, alcohol abuse, and chronic pain who is being seen today for the evaluation of recurrent Afib at the request of Dr. Lonny Prude.  History of Present Illness:   Calvin Esparza was diagnosed in with Afib in 03/2018, when he presented with chest pain and dizziness. He was started on beta blocker and Xarelto. He subsequently followed up with Dr. Rockey Situ in 09/2018 and was noted to be in sinus rhythm at that time. He had run out of Xarelto, and given a CHADS2VASc of 1 at that time, this was not resumed. In 02/2019, he was diagnosed in COVID-19, and was noted to be in sinus rhythm. Beta blocker and Eliquis were re-prescribed, though he did not refill these medications. Following COVID infection, he continued to note DOE and chest tightness. In 05/2019, he was seen in the ED and noted to be back in Afib with RVR. CTA chest showed a PE in the lower branch of the right pulmonary artery. Echo showed an EF of 35-40%, Gr1DD, and a moderately enlarged RV. Stress test was nonischemic. He was subsequently changed from Eliquis to Xarelto in an effort to improve patient compliance. He remained in Afib in 07/2019 and subsequently underwent successful DCCV on 07/29/2019. In 08/2019, he suffered a gout flare and was placed on prednisone. With this, he noted abdominal bloating, hunger, and other typical adverse effects of prednisone. In this setting, he discontinued all his medications. With this, he began to notice left thigh pain and SOB.  He presented to the ED on 6/23 and was found to be back in Afib with RVR.  Left lower extremity ultrasound showed a superficial venous thrombosis in the greater saphenous vein throughout almost the entire though. No DVT was noted. He underwent CTA of the chest which showed small PE in the right middle and lower lobe pulmonary arteries. He was subsequently treated with IV heparin with ultimate transition to [place] at discharge. He was rate controlled with beta blocker and calcium channel blocker. Of note, his cardiomyopathy was felt to be tachy-mediated, and he anecdotally responded to diltiazem more than titrated doses of metoprolol.    He was seen in hospital follow-up on 09/21/2019 and remained in A. fib with controlled ventricular response.  His Cardizem was escalated to 300 mg daily and he was continued on Toprol-XL.  He has not missed any doses of anticoagulation per his report.  Plans were for him to follow up in 3 weeks time and if he remained in A. fib we would pursue DCCV.  Since he was last seen, he was placed on colchicine for gout.  He held his diltiazem and this setting.  In this setting, his primary cardiologist recommended metoprolol be increased to 150 mg bid.  He stopped taking Lasix approximately 1 week ago as he felt like he was dehydrated.  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  He denies missing any doses of Xarelto and reports she has continued to take 15 mg twice daily even  though he was instructed to transition to once daily dosing on 10/03/2019.  Patient was at work earlier today pouring concrete in the heat when he began to feel weak, dizzy, and have blurry vision.  Upon sitting down symptoms improved.  However, upon getting back up to work again the symptoms returned.  Indicates he drank 4 bottles of water earlier this morning.  With second exertion he does note some tachypalpitations and dyspnea otherwise he denies chest pain, lower extremity swelling, or  worsening orthopnea, abdominal distention, or PND early satiety.  Denies any falls, hematochezia, or melena  Upon the patient's arrival to [facility] they were found to be in A. fib with RVR with ventricular rates into the 130s bpm.  BP in the 440H systolic.  EKG showed A. fib with RVR, 134 bpm, nonspecific ST-T changes, CXR showed no evidence of acute cardiopulmonary abnormality. Labs showed an initial high-sensitivity troponin of 5 with a delta pending, potassium 4.3, BUN 24, serum creatinine 1.81 with a baseline around 0.9-1.0, magnesium 2.0, Hgb 14.2.  In the ED he was given lactated Ringer bolus and 10 mg IV Cardizem followed by Cardizem drip.  Currently, he remains in A. fib with improved ventricular response in the 80s to 90s bpm.  Past Medical History:  Diagnosis Date  . Alcohol abuse   . Chronic pain   . COVID-19    a. 02/2019  . HFrEF (heart failure with reduced ejection fraction) ([place])    a. 05/2019 Echo: EF 35-40%.  Marland Kitchen NICM (nonischemic cardiomyopathy) ([place])    a. 05/2019 Echo: EF 35-40%, gr1 DD. Mod enlarged RV. Mildly dil LA. Mild to mod dil RA. Triv AI; b. 05/2019 MV: EF 32%, no ischemia.  . Persistent atrial fibrillation ([place])    a. Dx 05/2019 in setting of PE. CHA2DS2VASc = 1-->Eliquis later changed to xarelto; b. 07/2019 s/p DCCV (150J).  . Pulmonary embolism ([place])    a. 05/2019 CTA Chest: mild amt of PE w/in a lower lobe branch of RPA; b. 08/2019 CTA Chest: Small PE in RML and RLL PA.    Past Surgical History:  Procedure Laterality Date  . CARDIOVERSION N/A 07/29/2019   Procedure: CARDIOVERSION;  Surgeon: Minna Merritts, MD;  Location: [facility] ORS;  Service: Cardiovascular;  Laterality: N/A;     Home Meds: Prior to Admission medications   Medication Sig Start Date End Date Taking? Authorizing Provider  furosemide (LASIX) 20 MG tablet Take 1 tablet (20 mg total) by mouth daily. 09/10/19 10/10/19 Yes Mikhail, Maryann, DO  colchicine-probenecid 0.5-500 MG tablet Take 1 tablet by mouth 2  (two) times daily. 09/26/19   [provider]  metoprolol succinate (TOPROL-XL) 100 MG 24 hr tablet Take 1 tablet (100 mg total) by mouth in the morning and at bedtime. Take with or immediately following a meal. 10/04/19   Gollan, Kathlene November, MD  metoprolol succinate (TOPROL-XL) 50 MG 24 hr tablet Take 1 tablet (50 mg total) by mouth in the morning and at bedtime. Take with or immediately following a meal. 10/04/19   Gollan, Kathlene November, MD  Rivaroxaban (XARELTO) 15 MG TABS tablet Take 15 mg by mouth in the morning.    [provider]  XARELTO 20 MG TABS tablet Take 20 mg by mouth daily with supper.  09/10/19   [provider]    Inpatient Medications: Scheduled Meds: . sodium chloride flush  3 mL Intravenous Once   Continuous Infusions: . diltiazem (CARDIZEM) infusion 5 mg/hr (10/10/19 1459)   PRN  Meds:   Allergies:  No Known Allergies  Social History:   Social History   Socioeconomic History  . Marital status: Married    Spouse name: Not on file  . Number of children: Not on file  . Years of education: Not on file  . Highest education level: Not on file  Occupational History  . Not on file  Tobacco Use  . Smoking status: Never Smoker  . Smokeless tobacco: Current User    Types: Chew  Vaping Use  . Vaping Use: Never used  Substance and Sexual Activity  . Alcohol use: Yes    Alcohol/week: 24.0 standard drinks    Types: 24 Cans of beer per week    Comment: 6 beers daily, weekend drinks more-  . Drug use: Not Currently  . Sexual activity: Not on file  Other Topics Concern  . Not on file  Social History Narrative  . Not on file   Social Determinants of Health   Financial Resource Strain:   . Difficulty of Paying Living Expenses:   Food Insecurity:   . Worried About Charity fundraiser in the Last Year:   . Arboriculturist in the Last Year:   Transportation Needs:   . Film/video editor (Medical):   Marland Kitchen Lack of Transportation (Non-Medical):     Physical Activity:   . Days of Exercise per Week:   . Minutes of Exercise per Session:   Stress:   . Feeling of Stress :   Social Connections:   . Frequency of Communication with Friends and Family:   . Frequency of Social Gatherings with Friends and Family:   . Attends Religious Services:   . Active Member of Clubs or Organizations:   . Attends Archivist Meetings:   Marland Kitchen Marital Status:   Intimate Partner Violence:   . Fear of Current or Ex-Partner:   . Emotionally Abused:   Marland Kitchen Physically Abused:   . Sexually Abused:      Family History:   Family History  Problem Relation Age of Onset  . Bone cancer Father   . Stroke Father   . Diabetes Sister     ROS:  Review of Systems  Constitutional: Positive for malaise/fatigue. Negative for chills, diaphoresis, fever and weight loss.  HENT: Negative for congestion.   Eyes: Negative for discharge and redness.  Respiratory: Positive for shortness of breath. Negative for cough, hemoptysis, sputum production and wheezing.   Cardiovascular: Positive for palpitations and orthopnea. Negative for chest pain, claudication, leg swelling and PND.       2-pillow orthopnea   Gastrointestinal: Negative for abdominal pain, blood in stool, heartburn, melena, nausea and vomiting.  Genitourinary: Negative for hematuria.  Musculoskeletal: Positive for joint pain and myalgias. Negative for falls.  Skin: Negative for rash.  Neurological: Positive for weakness. Negative for dizziness, tingling, tremors, sensory change, speech change, focal weakness and loss of consciousness.  Endo/Heme/Allergies: Does not bruise/bleed easily.  Psychiatric/Behavioral: Negative for substance abuse. The patient is not nervous/anxious.   All other systems reviewed and are negative.     Physical Exam/Data:   Vitals:   10/10/19 1338 10/10/19 1341 10/10/19 1500  BP: 125/73  (!) 122/89  Pulse: (!) 133  102  Resp: 16  20  Temp: 97.9 F (36.6 C)    TempSrc: Oral     SpO2: 99%  96%  Weight:  88.5 kg   Height:  5\' 9"  (1.753 m)    No intake or  output data in the 24 hours ending 10/10/19 1538 Filed Weights   10/10/19 1341  Weight: 88.5 kg   Body mass index is 28.8 kg/m.   Physical Exam: General: Well developed, well nourished, in no acute distress. Head: Normocephalic, atraumatic, sclera non-icteric, no xanthomas, nares without discharge.  Neck: Negative for carotid bruits. JVD not elevated. Lungs: Clear bilaterally to auscultation without wheezes, rales, or rhonchi. Breathing is unlabored. Heart: Irregularly irregular with S1 S2. No murmurs, rubs, or gallops appreciated. Abdomen: Soft, non-tender, non-distended with normoactive bowel sounds. No hepatomegaly. No rebound/guarding. No obvious abdominal masses. Msk:  Strength and tone appear normal for age. Extremities: No clubbing or cyanosis. No edema. Distal pedal pulses are 2+ and equal bilaterally. Neuro: Alert and oriented X 3. No facial asymmetry. No focal deficit. Moves all extremities spontaneously. Psych:  Responds to questions appropriately with a normal affect.   EKG:  The EKG was personally reviewed and demonstrates: Afib with RVR, 134 bpm, nonspecific st/t changes  Telemetry:  Telemetry was personally reviewed and demonstrates: Afib, 80s to low 100s bpm  Weights: Filed Weights   10/10/19 1341  Weight: 88.5 kg    Relevant CV Studies:  2D echo 05/2019: 1. Left ventricular ejection fraction, by estimation, is 35 to 40%. The  left ventricle has moderately decreased function. The left ventricle  demonstrates global hypokinesis. There is mild left ventricular  hypertrophy. Left ventricular diastolic  parameters are consistent with Grade I diastolic dysfunction (impaired  relaxation).  2. Right ventricular systolic function is mildly reduced. The right  ventricular size is moderately enlarged.  3. Left atrial size was mildly dilated.  4. Right atrial size was mild to  moderately dilated.  5. The mitral valve is grossly normal. No evidence of mitral valve  regurgitation.  6. The aortic valve is tricuspid. Aortic valve regurgitation is trivial.  7. The inferior vena cava is normal in size with greater than 50%  respiratory variability, suggesting right atrial pressure of 3 mmHg.  Laboratory Data:  Chemistry Recent Labs  Lab 10/10/19 1345  NA 138  K 4.3  CL 104  CO2 20*  GLUCOSE 128*  BUN 24*  CREATININE 1.81*  CALCIUM 9.9  GFRNONAA 41*  GFRAA 47*  ANIONGAP 14    No results for input(s): PROT, ALBUMIN, AST, ALT, ALKPHOS, BILITOT in the last 168 hours. Hematology Recent Labs  Lab 10/10/19 1345  WBC 7.5  RBC 4.50  HGB 14.2  HCT 41.9  MCV 93.1  MCH 31.6  MCHC 33.9  RDW 13.4  PLT 204   Cardiac EnzymesNo results for input(s): TROPONINI in the last 168 hours. No results for input(s): TROPIPOC in the last 168 hours.  BNPNo results for input(s): BNP, PROBNP in the last 168 hours.  DDimer No results for input(s): DDIMER in the last 168 hours.  Radiology/Studies:  DG Chest 2 View  Result Date: 10/10/2019 IMPRESSION: No evidence of acute cardiopulmonary abnormality. Mild aneurysmal dilatation of the ascending aorta was better appreciated on prior CTA chest 09/08/2019. Electronically Signed   By: Kellie Simmering DO   On: 10/10/2019 14:57    Assessment and Plan:   1.  Persistent A. fib with RVR: -Likely exacerbated by dehydration -Continue Cardizem drip for tonight -Initially, he did not want DCCV, though later indicated he does want this if needed -Anecdotally, he has responded to diltiazem more so than escalated doses of metoprolol -Diltiazem was recently held in the setting of him being placed on colchicine for gout -In this  setting, his Toprol was increased to 150 mg bid by his primary cardiologist  -With the patient's continued alcohol abuse and medication nonadherence at times I feel this is reasonable -We will make the patient  n.p.o. at midnight for potential DCCV on 7/27 -He reports compliance with anticoagulation and denies missing any doses -In this setting, could consider AAT as well -Continue Xarelto, at baseline his creatinine clearance is greater than 50 mL/min with his most recent serum creatinine mildly elevated in the setting of AKI with dehydration -CHA2DS2-VASc 2 (CHF, vascular disease) -Estimated Creatinine Clearance: 49.6 mL/min (A) (by C-G formula based on SCr of 1.81 mg/dL (H)).  2.  Presyncope: -Appears to be in the setting of dehydration with labs consistent with prerenal state  -He works out in the sun pouring concrete  -Continue to hold Lasix -Hydration -Monitor on telemetry   3. AKI: -Patient was recently started on colchicine for gout -Labs consistent with prerenal state -Hydration  4.  HFrEF secondary to presumed NICM: -His cardiomyopathy has been felt to be tachycardia mediated with low risk Lexiscan MPI in 05/2019 -He appears euvolemic to dehydrated  -BNP pending -He self-discontinued Lasix ~ 1 week prior -For now, continue Cardizem drip outlined above for rate control -Resume metoprolol prior to discharge -In the setting of AKI hold off on ACE inhibitor/ARB/Entresto/MRA  5.  Right middle lobe and lower lobe PE: -Patient was due to transition from twice daily dosed Xarelto to once daily dose Xarelto on 7/19 though he has continued to take this medication twice daily -He denies missing any doses of anticoagulation -Recommend decreasing patient's Xarelto back down to 20 mg daily  6.  Alcohol use: -He continues to drink 5-6 beers per day -He was advised with continued alcohol use his A. fib burden will continue to increase despite escalation in medical therapy -Complete cessation is recommended  7.  Dilated ascending aorta: -Incidentally noted on CTA chest in 08/2019 -Follow-up imaging recommended in 12 months time as an outpatient   For questions or updates, please contact  [place] Please consult [email or web address] for contact info under Cardiology/STEMI.   Signed, Christell Faith, PA-C [place] Pager: [phone] 10/10/2019, 3:38 PM

## 2019-10-11 DIAGNOSIS — T671XXA Heat syncope, initial encounter: Secondary | ICD-10-CM | POA: Diagnosis not present

## 2019-10-11 DIAGNOSIS — I4891 Unspecified atrial fibrillation: Secondary | ICD-10-CM | POA: Diagnosis not present

## 2019-10-11 DIAGNOSIS — R55 Syncope and collapse: Secondary | ICD-10-CM | POA: Diagnosis not present

## 2019-10-11 DIAGNOSIS — N179 Acute kidney failure, unspecified: Secondary | ICD-10-CM | POA: Diagnosis not present

## 2019-10-11 DIAGNOSIS — F1010 Alcohol abuse, uncomplicated: Secondary | ICD-10-CM | POA: Diagnosis not present

## 2019-10-11 DIAGNOSIS — I420 Dilated cardiomyopathy: Secondary | ICD-10-CM

## 2019-10-11 DIAGNOSIS — I4819 Other persistent atrial fibrillation: Secondary | ICD-10-CM | POA: Diagnosis not present

## 2019-10-11 DIAGNOSIS — I5042 Chronic combined systolic (congestive) and diastolic (congestive) heart failure: Secondary | ICD-10-CM | POA: Diagnosis not present

## 2019-10-11 LAB — BASIC METABOLIC PANEL
Anion gap: 9 (ref 5–15)
BUN: 19 mg/dL (ref 6–20)
CO2: 24 mmol/L (ref 22–32)
Calcium: 9 mg/dL (ref 8.9–10.3)
Chloride: 107 mmol/L (ref 98–111)
Creatinine, Ser: 1.19 mg/dL (ref 0.61–1.24)
GFR calc Af Amer: >60 mL/min (ref >60)
GFR calc non Af Amer: >60 mL/min (ref >60)
Glucose, Bld: 128 mg/dL — ABNORMAL HIGH (ref 70–99)
Potassium: 3.5 mmol/L (ref 3.5–5.1)
Sodium: 140 mmol/L (ref 135–145)

## 2019-10-11 LAB — CBC
HCT: 38.3 % — ABNORMAL LOW (ref 39.0–52.0)
Hemoglobin: 12.7 g/dL — ABNORMAL LOW (ref 13.0–17.0)
MCH: 31 pg (ref 26.0–34.0)
MCHC: 33.2 g/dL (ref 30.0–36.0)
MCV: 93.4 fL (ref 80.0–100.0)
Platelets: 159 10*3/uL (ref 150–400)
RBC: 4.1 MIL/uL — ABNORMAL LOW (ref 4.22–5.81)
RDW: 13.2 % (ref 11.5–15.5)
WBC: 3.9 10*3/uL — ABNORMAL LOW (ref 4.0–10.5)
nRBC: 0 % (ref 0.0–0.2)

## 2019-10-11 MED ORDER — METOPROLOL SUCCINATE ER 100 MG PO TB24: 100.0000 mg | ORAL_TABLET | Freq: Two times a day (BID) | ORAL | 3 refills | Status: DC

## 2019-10-11 MED ORDER — FUROSEMIDE 20 MG PO TABS: ORAL_TABLET | ORAL | Status: DC

## 2019-10-11 MED ORDER — METOPROLOL SUCCINATE ER 50 MG PO TB24
150.0000 mg | ORAL_TABLET | Freq: Two times a day (BID) | ORAL | Status: DC
  Administered 2019-10-11: 150 mg via ORAL
  Filled 2019-10-11: qty 3

## 2019-10-11 NOTE — Progress Notes (Signed)
Progress Note  Patient Name: Calvin Esparza Date of Encounter: 10/11/2019  Primary Cardiologist: Rockey Situ  Subjective   He remains in Afib with improved ventricular rates in the 80s bpm. He did not receive Xarelto last evening. No chest pain, dyspnea, orthopnea, or palpitations.   Inpatient Medications    Scheduled Meds: . folic acid  1 mg Oral Daily  . multivitamin with minerals  1 tablet Oral Daily  . rivaroxaban  20 mg Oral Q supper  . thiamine  100 mg Oral Daily   Or  . thiamine  100 mg Intravenous Daily   Continuous Infusions: . diltiazem (CARDIZEM) infusion 5 mg/hr (10/10/19 1706)   PRN Meds: acetaminophen, LORazepam **OR** LORazepam, ondansetron (ZOFRAN) IV   Vital Signs    Vitals:   10/11/19 0053 10/11/19 0213 10/11/19 0528 10/11/19 0735  BP: 113/78 116/82 111/81 (!) 137/96  Pulse: 88 85 84 93  Resp: 18 18 16 16   Temp:      TempSrc:      SpO2: 93% 92% 95% 94%  Weight:      Height:        Intake/Output Summary (Last 24 hours) at 10/11/2019 0803 Last data filed at 10/10/2019 1706 Gross per 24 hour  Intake 2000 ml  Output --  Net 2000 ml   Filed Weights   10/10/19 1341  Weight: 88.5 kg    Telemetry    Afib with ventricular rates in the 80s bpm - Personally Reviewed  ECG    No new changes - Personally Reviewed  Physical Exam   GEN: No acute distress.   Neck: No JVD. Cardiac: Irregularly irregular, no murmurs, rubs, or gallops.  Respiratory: Clear to auscultation bilaterally.  GI: Soft, nontender, non-distended.   MS: No edema; No deformity. Neuro:  Alert and oriented x 3; Nonfocal.  Psych: Normal affect.  Labs    Chemistry Recent Labs  Lab 10/10/19 1345 10/11/19 0457  NA 138 140  K 4.3 3.5  CL 104 107  CO2 20* 24  GLUCOSE 128* 128*  BUN 24* 19  CREATININE 1.81* 1.19  CALCIUM 9.9 9.0  GFRNONAA 41* >60  GFRAA 47* >60  ANIONGAP 14 9     Hematology Recent Labs  Lab 10/10/19 1345 10/11/19 0457  WBC 7.5 3.9*  RBC 4.50  4.10*  HGB 14.2 12.7*  HCT 41.9 38.3*  MCV 93.1 93.4  MCH 31.6 31.0  MCHC 33.9 33.2  RDW 13.4 13.2  PLT 204 159    Cardiac EnzymesNo results for input(s): TROPONINI in the last 168 hours. No results for input(s): TROPIPOC in the last 168 hours.   BNP Recent Labs  Lab 10/10/19 1630  BNP 1,007.1*     DDimer No results for input(s): DDIMER in the last 168 hours.   Radiology    DG Chest 2 View  Result Date: 10/10/2019 IMPRESSION: No evidence of acute cardiopulmonary abnormality. Mild aneurysmal dilatation of the ascending aorta was better appreciated on prior CTA chest 09/08/2019. Electronically Signed   By: Kellie Simmering DO   On: 10/10/2019 14:57    Cardiac Studies   2D echo 05/2019: 1. Left ventricular ejection fraction, by estimation, is 35 to 40%. The  left ventricle has moderately decreased function. The left ventricle  demonstrates global hypokinesis. There is mild left ventricular  hypertrophy. Left ventricular diastolic  parameters are consistent with Grade I diastolic dysfunction (impaired  relaxation).  2. Right ventricular systolic function is mildly reduced. The right  ventricular size is  moderately enlarged.  3. Left atrial size was mildly dilated.  4. Right atrial size was mild to moderately dilated.  5. The mitral valve is grossly normal. No evidence of mitral valve  regurgitation.  6. The aortic valve is tricuspid. Aortic valve regurgitation is trivial.  7. The inferior vena cava is normal in size with greater than 50%  respiratory variability, suggesting right atrial pressure of 3 mmHg.  Patient Profile     59 y.o. male with history of persistent Afib s/p DCCV in 07/2019 on [place] with noncompliance, HFrEF secondary to NICM, PE in 3/2021noncompliantwith anticoagulationwith recurrent PE in 08/2019, COVID-19 in 02/2019, alcohol abuse, and chronic pain who is being seen today for the evaluation of recurrent Afib.  Assessment & Plan    1. Persistent A.  fib with RVR:  -Likely exacerbated by dehydration which is improved this morning  -Stop diltiazem gtt -Anecdotally, he has responded to diltiazem more so than escalated doses of metoprolol, though given his cardiomyopathy, we are attempting to avoid diltiazem -Diltiazem was recently held in the setting of him being placed on colchicine for gout, though he has reported generalized joint pain which is less consistent with gout -He did not receive Xarelto yesterday, in this setting we cannot perform DCCV today without TEE -Given improved symptoms and rate control with hydration there is no indication for TEE-guided DCCV at this time -Start Toprol XL 150 mg, per Dr. Rockey Situ -Will hold off on resuming diltiazem at this time -With the patient's continued alcohol abuse there are concerns he will not hold sinus rhythm long term -Consider AAT, with his cardiomyopathy these are limited to amiodarone and Tikosyn -As an outpatient have him see EP for discussion of ablation, alcohol cessation will need to be in place with this -Potential outpatient DCCV after he has been adequately anticoagulated without missing a dose  -He reported compliance with anticoagulation upon presentation to the ED and has denied missing any doses -Continue Xarelto -CHA2DS2-VASc 2 (CHF, vascular disease) -Estimated Creatinine Clearance: 75.4 mL/min (by C-G formula based on SCr of 1.19 mg/dL).  2.  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 AKI: -Improved with hydration  -Patient was recently started on colchicine for gout -Labs consistent with prerenal state  4.  HFrEF secondary to presumed NICM: -His cardiomyopathy has been felt to be tachycardia mediated with low risk  Lexiscan MPI in 05/2019 -He appears euvolemic to dehydrated  -BNP mildly elevated, likely in the setting of underlying cardiomyopathy and AKI -He self-discontinued Lasix ~ 1 week prior, continue to hold for now -Resume metoprolol as above -In the setting of AKI and history of relative hypotension hold off on ACE inhibitor/ARB/Entresto/MRA -BP room needed for added rate control -He drinks large amounts of liquid, including alcohol daily -CHF education -Daily weights  5.  Right middle lobe and lower lobe PE: -Patient was due to transition from twice daily dosed Xarelto to once daily dose Xarelto on 7/19 though he has continued to take this medication twice daily as an outpatient -Now on once daily dosed Xarelto -He denies missing any doses of anticoagulation at home  6.  Alcohol use: -He continues to drink 5-6 beers per day -He was advised with continued alcohol use his A.  fib burden will continue to increase despite escalation in medical therapy -Complete cessation is recommended  7.  Dilated ascending aorta: -Incidentally noted on CTA chest in 08/2019 -Follow-up imaging recommended in 12 months time as an outpatient   For questions or updates, please contact [place] Please consult [email or web address] for contact info under Cardiology/STEMI.    Signed, Christell Faith, PA-C [place] Pager: [phone] 10/11/2019, 8:03 AM

## 2019-10-11 NOTE — TOC Transition Note (Signed)
Transition of Care [facility]) - CM/SW Discharge Note   Patient Details  Name: Calvin Esparza MRN: [id number] Date of Birth: 1960-06-14  Transition of Care [facility]) CM/SW Contact:  Adelene Amas, [place] Phone Number: 10/11/2019, 12:33 PM   Clinical Narrative:     CSW spoke with patient and his wife Calvin Esparza.  Patient was initially sleeping when CSW arrives but woke up as this CSW spoke with his wife.  This CSW gave his wife a list of resources in [place] including substance abuse/use rehab resources. Ms. Livesey thanked the CSW for the resources and asked about doctor's appointment for this week.  Patient then spoke up and stated he was told he needed to call the doctor himself and make the appointments.  CSW asked if they had any other questions both said no.  CSW stated if they had any other questions about the resource list to as for the ED Social Worker.  Ms. Zwahlen verbalized understanding.  TOC consult completed.  Final next level of care: Home/Self Care Barriers to Discharge: No Barriers Identified   Patient Goals and CMS Choice        Discharge Placement                       Discharge Plan and Services In-house Referral: Clinical Social Work                                   Social Determinants of Health (SDOH) Interventions     Readmission Risk Interventions No flowsheet data found.

## 2019-10-11 NOTE — Discharge Summary (Signed)
Physician Discharge Summary  Calvin Esparza RXV:[id number] DOB: Oct 26, 1960 DOA: 10/10/2019  PCP: [name], MD  Admit date: 10/10/2019 Discharge date: 10/11/2019  Admitted From: Home Disposition: Home  Recommendations for Outpatient Follow-up:  1. Follow up with PCP in 1 week 2. Follow-up with cardiology 3. Please follow up on the following pending results: None  Home Health: None Equipment/Devices: None  Discharge Condition: Stable CODE STATUS: Full code Diet recommendation: Heart healthy   Brief/Interim Summary:  Chief Complaint: Feeling weak  HPI: Calvin Esparza is a 59 y.o. male with medical history significant of PE, COVID-19, alcohol abuse, HFrEF, persistent atrial fibrillation. 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 He reports having blurry vision and leg weakness causing him to sit down on the ground. He stopped taking his lasix about one week ago. He did not take his metoprolol this morning.   Hospital course:  Presyncope Appears likely related to atrial fibrillation with RVR in setting of reduced EF heart failure in addition to dehydration. Recent Transthoracic Echocardiogram from 06/08/19 without significant valvular dysfunctions. Possibly secondary to heat exhaustion.  Persistent atrial fibrillation with RVR In setting of medication non-adherence but also appears this has been difficult to control as an outpatient. Patient is on Xarelto and metoprolol as an outpatient. Initial recommendation for cardioversion, however, symptoms improved significantly with IV fluids and patient did not receive Xarelto dose on 7/26. Patient transitioned off diltiazem drip and started on home metoprolol dose. Patient discharged with home metoprolol regimen.  HFrEF Patient is currently euvolemic. Patient has been non-adherent with lasix secondary to feeling dehydrated. BNP elevated at 1007. Lasix held on admission  and per discussion with cardiology, recommendations to change Lasix to prn dosing. Patient to continue metoprolol succinate 150 mg BID  AKI Possibly secondary to dehydration. IV fluids given in ED. Baseline creatinine of 0.9, creatinine of 1.8 on admission. Trended down to 1.19 prior to discharge.  History of pulmonary embolism Patient is on Xarelto. Patient states he is taking 15 mg BID but this dosing should have completed on 7/17. Xarelto 20 mg daily.  Alcohol abuse At least 6 beers daily, sometimes in AM on weekends. CIWA while inpatient. Alcohol cessation discussed.   Discharge Diagnoses:  Principal Problem:   Atrial fibrillation with RVR (HCC) Active Problems:   Alcohol abuse   Chronic combined systolic and diastolic CHF (congestive heart failure) (HCC)   Persistent atrial fibrillation (HCC)   Pre-syncope    Discharge Instructions   Allergies as of 10/11/2019   No Known Allergies     Medication List    TAKE these medications   colchicine-probenecid 0.5-500 MG tablet Take 1 tablet by mouth 2 (two) times daily.   furosemide 20 MG tablet Commonly known as: LASIX PRN for increased leg swelling, weight gain of 3 lbs over one day What changed:   how much to take  how to take this  when to take this  additional instructions   metoprolol succinate 50 MG 24 hr tablet Commonly known as: TOPROL-XL Take 1 tablet (50 mg total) by mouth in the morning and at bedtime. Take with or immediately following a meal. What changed: additional instructions   metoprolol succinate 100 MG 24 hr tablet Commonly known as: TOPROL-XL Take 1 tablet (100 mg total) by mouth in the morning and at bedtime. Take with or immediately following a meal. What changed:   when to take this  additional instructions  Xarelto 20 MG Tabs tablet Generic drug: rivaroxaban Take 20 mg by mouth daily with supper.       Follow-up Information    [name], MD. Schedule an  appointment as soon as possible for a visit in 1 week(s).   Specialty: Internal Medicine Contact information: [address] [place] [address] [phone]        Minna Merritts, MD Follow up.   Specialty: Cardiology Why: Hospital follow-up Contact information: [place] [address] [phone]              No Known Allergies  Consultations:  Cardiology   Procedures/Studies: DG Chest 2 View  Result Date: 10/10/2019 CLINICAL DATA:  Near syncope. Additional provided: History of atrial fibrillation, history of PE, smoker EXAM: CHEST - 2 VIEW COMPARISON:  CT angiogram chest 09/08/2019, chest radiograph 09/08/2019 FINDINGS: Heart size within normal limits. Mild aneurysmal dilatation of the ascending aorta was better appreciated on CT angiogram chest 09/08/2019. There is no appreciable airspace consolidation or pulmonary edema. No evidence of pleural effusion or pneumothorax. No acute bony abnormality identified. Thoracic spondylosis. IMPRESSION: No evidence of acute cardiopulmonary abnormality. Mild aneurysmal dilatation of the ascending aorta was better appreciated on prior CTA chest 09/08/2019. Electronically Signed   By: Kellie Simmering DO   On: 10/10/2019 14:57     Subjective: No issues this morning.  Discharge Exam: Vitals:   10/11/19 0528 10/11/19 0735  BP: 111/81 (!) 137/96  Pulse: 84 93  Resp: 16 16  Temp:    SpO2: 95% 94%   Vitals:   10/11/19 0053 10/11/19 0213 10/11/19 0528 10/11/19 0735  BP: 113/78 116/82 111/81 (!) 137/96  Pulse: 88 85 84 93  Resp: 18 18 16 16   Temp:      TempSrc:      SpO2: 93% 92% 95% 94%  Weight:      Height:        General: Pt is alert, awake, not in acute distress Cardiovascular: Irregular rhythm, normal rate, S1/S2 +, no rubs, no gallops Respiratory: CTA bilaterally, no wheezing, no rhonchi Abdominal: Soft, NT, ND, bowel sounds + Extremities: no edema, no  cyanosis    The results of significant diagnostics from this hospitalization (including imaging, microbiology, ancillary and laboratory) are listed below for reference.     Microbiology: Recent Results (from the past 240 hour(s))  SARS Coronavirus 2 by RT PCR (hospital order, performed in [facility] hospital lab) Nasopharyngeal Nasopharyngeal Swab     Status: None   Collection Time: 10/10/19  3:07 PM   Specimen: Nasopharyngeal Swab  Result Value Ref Range Status   SARS Coronavirus 2 NEGATIVE NEGATIVE Final    Comment: (NOTE) SARS-CoV-2 target nucleic acids are NOT DETECTED.  The SARS-CoV-2 RNA is generally detectable in upper and lower respiratory specimens during the acute phase of infection. The lowest concentration of SARS-CoV-2 viral copies this assay can detect is 250 copies / mL. A negative result does not preclude SARS-CoV-2 infection and should not be used as the sole basis for treatment or other patient management decisions.  A negative result may occur with improper specimen collection / handling, submission of specimen other than nasopharyngeal swab, presence of viral mutation(s) within the areas targeted by this assay, and inadequate number of viral copies (<250 copies / mL). A negative result must be combined with clinical observations, patient history, and epidemiological information.  Fact Sheet for Patients:   StrictlyIdeas.no  Fact Sheet for Healthcare Providers: BankingDealers.co.za  This test is not yet approved or  cleared by the [place] FDA and has been authorized for detection and/or diagnosis of SARS-CoV-2 by FDA under an Emergency Use Authorization (EUA).  This EUA will remain in effect (meaning this test can be used) for the duration of the COVID-19 declaration under Section 564(b)(1) of the Act, 21 U.S.C. section 360bbb-3(b)(1), unless the authorization is terminated or revoked sooner.  Performed  at [facility], [address] [place], [place] [address]      Labs: BNP (last 3 results) Recent Labs    03/14/19 1232 09/08/19 1158 10/10/19 1630  BNP 316.0* 669.8* 2,703.5*   Basic Metabolic Panel: Recent Labs  Lab 10/10/19 1345 10/11/19 0457  NA 138 140  K 4.3 3.5  CL 104 107  CO2 20* 24  GLUCOSE 128* 128*  BUN 24* 19  CREATININE 1.81* 1.19  CALCIUM 9.9 9.0  MG 2.0  --    Liver Function Tests: No results for input(s): AST, ALT, ALKPHOS, BILITOT, PROT, ALBUMIN in the last 168 hours. No results for input(s): LIPASE, AMYLASE in the last 168 hours. No results for input(s): AMMONIA in the last 168 hours. CBC: Recent Labs  Lab 10/10/19 1345 10/11/19 0457  WBC 7.5 3.9*  HGB 14.2 12.7*  HCT 41.9 38.3*  MCV 93.1 93.4  PLT 204 159   Cardiac Enzymes: No results for input(s): CKTOTAL, CKMB, CKMBINDEX, TROPONINI in the last 168 hours. BNP: Invalid input(s): POCBNP CBG: No results for input(s): GLUCAP in the last 168 hours. D-Dimer No results for input(s): DDIMER in the last 72 hours. Hgb A1c No results for input(s): HGBA1C in the last 72 hours. Lipid Profile No results for input(s): CHOL, HDL, LDLCALC, TRIG, CHOLHDL, LDLDIRECT in the last 72 hours. Thyroid function studies No results for input(s): TSH, T4TOTAL, T3FREE, THYROIDAB in the last 72 hours.  Invalid input(s): FREET3 Anemia work up No results for input(s): VITAMINB12, FOLATE, FERRITIN, TIBC, IRON, RETICCTPCT in the last 72 hours. Urinalysis No results found for: COLORURINE, APPEARANCEUR, [place], [place], [place], [place], [place], [place], PROTEINUR, UROBILINOGEN, NITRITE, LEUKOCYTESUR Sepsis Labs Invalid input(s): PROCALCITONIN,  WBC,  LACTICIDVEN Microbiology Recent Results (from the past 240 hour(s))  SARS Coronavirus 2 by RT PCR (hospital order, performed in [facility] hospital lab) Nasopharyngeal Nasopharyngeal Swab     Status: None   Collection Time: 10/10/19  3:07 PM    Specimen: Nasopharyngeal Swab  Result Value Ref Range Status   SARS Coronavirus 2 NEGATIVE NEGATIVE Final    Comment: (NOTE) SARS-CoV-2 target nucleic acids are NOT DETECTED.  The SARS-CoV-2 RNA is generally detectable in upper and lower respiratory specimens during the acute phase of infection. The lowest concentration of SARS-CoV-2 viral copies this assay can detect is 250 copies / mL. A negative result does not preclude SARS-CoV-2 infection and should not be used as the sole basis for treatment or other patient management decisions.  A negative result may occur with improper specimen collection / handling, submission of specimen other than nasopharyngeal swab, presence of viral mutation(s) within the areas targeted by this assay, and inadequate number of viral copies (<250 copies / mL). A negative result must be combined with clinical observations, patient history, and epidemiological information.  Fact Sheet for Patients:   StrictlyIdeas.no  Fact Sheet for Healthcare Providers: BankingDealers.co.za  This test is not yet approved or  cleared by the [place] FDA and has been authorized for detection and/or diagnosis of SARS-CoV-2 by  FDA under an Emergency Use Authorization (EUA).  This EUA will remain in effect (meaning this test can be used) for the duration of the COVID-19 declaration under Section 564(b)(1) of the Act, 21 U.S.C. section 360bbb-3(b)(1), unless the authorization is terminated or revoked sooner.  Performed at [facility], [address] [place], [place] [address]      SIGNED:   Cordelia Poche, MD [facility] 10/11/2019, 11:56 AM

## 2019-10-11 NOTE — Discharge Instructions (Signed)
Preventing Heat Exhaustion, Adult Heat exhaustion happens when your body gets too hot (overheated) from hot weather or from exercise. Untreated heat exhaustion could lead to heat stroke. Heat stroke can be deadly. How can heat exhaustion affect me? Early warning signs of heat exhaustion are:  Weakness.  Fatigue.  Stomach cramps.  Arm pain.  Leg cramps. Later symptoms of heat exhaustion include:  Heavy sweating.  Clammy skin.  Rapid, weak pulse.  Nausea or vomiting.  Dizziness.  Headache.  Fainting. If you have signs and symptoms of heat exhaustion, move to a cool place, loosen your clothing, and drink water or a sports drink. Then, put cool, wet compresses on your body or get into a cool bath or shower. What can increase my risk? People who work or exercise outside in hot weather have the highest risk of heat exhaustion. You may also be at higher risk if you:  Are over age 65. Older adults have a greater risk for heat exhaustion than younger adults.  Are overweight.  Have high blood pressure.  Have heart disease. What actions can I take to prevent heat exhaustion?      Avoid being outside on very hot days. Check your local news for extreme heat alerts or warnings.  In extreme heat, stay in an air-conditioned environment until the temperature cools off.  Check with your health care provider before starting any new exercise or activity. Ask about any health conditions or medicines that might increase your risk for heat exhaustion.  Wear lightweight, light-colored, and loose-fitting clothing in warm weather.  Do outdoor activities when it is cooler. This may be in the morning, late afternoon, or evening. Take breaks in the shade.  Do not work or exercise in the heat when you feel unwell or have been sick.  Start any new work or exercise activity gradually.  Protect yourself from the sun by wearing a broad-brimmed hat and using at least SPF 15 broad-spectrum  sunscreen.  Drink enough water or sports drink to keep your urine pale yellow. When it is hot, drink every 15 to 20 minutes, even if you are not thirsty.  Do not go out in the heat after a heavy meal.  Do not drink alcohol or caffeinated drinks when it is very hot outside.  If you have friends or family members who are older adults: ? Do not leave an older adult alone in a hot car. ? Make sure older adults have access to air-conditioning on very hot days. Remind them to drink enough fluids. Check on them at least twice a day if you can. Where to find more information  Centers for Disease Control and Prevention (CDC): https://www.cdc.gov/disasters/extremeheat/warning.html  American Academy of Family Physicians (AAFP): https://familydoctor.org/condition/heat-exhaustion-heatstroke  American Academy of Orthopaedic Surgeons (AAOS): https://orthoinfo.aaos.org/en/diseases--conditions/heat-injury-and-heat-exhaustion Contact a health care provider if:  You faint.  You feel weak or dizzy.  You have any signs or symptoms of heat exhaustion that last more than one hour. Get help right away if you have signs of heat stroke:  Body temperature of 103F (39.4C) or higher.  Hot, dry, red skin.  Fast, thumping pulse.  Confusion.  Loss of consciousness. Summary  Heat exhaustion happens when your body gets overheated and cannot cool down.  Avoid being outside on very hot days. Check your local news for extreme heat alerts or warnings. When you are out in the heat, take steps to protect yourself from the sun and stay hydrated.  If you have signs or symptoms of heat   exhaustion, get out of the heat, drink fluids, take steps to cool down, and contact a health care provider.  Get help right away if you have signs or symptoms of heat stroke. This information is not intended to replace advice given to you by your health care provider. Make sure you discuss any questions you have with your health  care provider. Document Revised: 11/26/2018 Document Reviewed: 06/10/2017 Elsevier Patient Education  2020 Elsevier Inc.  

## 2019-10-11 NOTE — ED Notes (Signed)
Pt sleeping at this time.

## 2019-10-11 NOTE — ED Notes (Signed)
Pt awake in bed at this time, Resting comfortably. VSS

## 2019-10-11 NOTE — ED Notes (Signed)
Pt asleep in bed at this time. VSS

## 2019-10-12 NOTE — Progress Notes (Signed)
Patient ID: Calvin Esparza, male    DOB: 08-Mar-1961, 59 y.o.   MRN: [id number]  HPI  [name] is a 59 y/o male with a history of atrial fibrillation, PE, alcohol use, tobacco use (chewing tobacco) and chronic heart failure.   Echo report from 06/08/19 reviewed and showed an EF of 35-40% along with mild LVH and mild LAE.   Was in the ED 10/10/19 due to weakness, palpitations and near syncope while at work. EKG showed AF with RVR along with AKI. IVF given and cardiology consulted. Initially placed on cardizem drip and then transitioned to oral metoprolol. Released the following day. Admitted 09/08/19 due to shortness of breath and leg pain. Cardiology consult obtained. Had not been taking his xarelto. Initially placed on cardizem and heparin drips and then transitioned to oral medications. Previous cardioversion ~ 1 month ago. Chest CT showed continued PE and leg ultrasound showed DVT throughout entire left thigh. Placed back on xarelto. Discharged after 2 days.    He presents today for his initial visit with a chief complaint of moderate fatigue upon minimal easily. He has associated chest tightness, shortness of breath, abdominal distention, back pain and anxiety along with this. He denies any difficulty sleeping, palpitations, pedal edema, chest pain, cough, dizziness or weight gain.   Taking his furosemide as needed (due to previous dehydration) and says that he took it yesterday because his abdomen felt swollen. 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 He says that he drinks "lots of fluid" during the day because of his occupation.   Says that ever since he contracted COVID around Christmas, he's had more health issues that he's ever had.   Past Medical History:  Diagnosis Date  . Alcohol abuse   . CHF (congestive heart failure) ([place])   . Chronic pain   . COVID-19    a. 02/2019  . HFrEF  (heart failure with reduced ejection fraction) ([place])    a. 05/2019 Echo: EF 35-40%.  Marland Kitchen NICM (nonischemic cardiomyopathy) ([place])    a. 05/2019 Echo: EF 35-40%, gr1 DD. Mod enlarged RV. Mildly dil LA. Mild to mod dil RA. Triv AI; b. 05/2019 MV: EF 32%, no ischemia.  . Persistent atrial fibrillation ([place])    a. Dx 05/2019 in setting of PE. CHA2DS2VASc = 1-->Eliquis later changed to xarelto; b. 07/2019 s/p DCCV (150J).  . Pulmonary embolism ([place])    a. 05/2019 CTA Chest: mild amt of PE w/in a lower lobe branch of RPA; b. 08/2019 CTA Chest: Small PE in RML and RLL PA.   Past Surgical History:  Procedure Laterality Date  . CARDIOVERSION N/A 07/29/2019   Procedure: CARDIOVERSION;  Surgeon: Minna Merritts, MD;  Location: [facility] ORS;  Service: Cardiovascular;  Laterality: N/A;   Family History  Problem Relation Age of Onset  . Bone cancer Father   . Stroke Father   . Diabetes Sister    Social History   Tobacco Use  . Smoking status: Never Smoker  . Smokeless tobacco: Current User    Types: Chew  Substance Use Topics  . Alcohol use: Yes    Alcohol/week: 24.0 standard drinks    Types: 24 Cans of beer per week    Comment: 6 beers daily, weekend drinks more-   No Known Allergies Prior to Admission medications   Medication Sig Start Date End Date Taking? Authorizing Provider  colchicine-probenecid 0.5-500  MG tablet Take 1 tablet by mouth 2 (two) times daily. 09/26/19  Yes [provider]  furosemide (LASIX) 20 MG tablet PRN for increased leg swelling, weight gain of 3 lbs over one day 10/11/19  Yes Mariel Aloe, MD  metoprolol succinate (TOPROL-XL) 100 MG 24 hr tablet Take 1 tablet (100 mg total) by mouth in the morning and at bedtime. Take with or immediately following a meal. 10/11/19  Yes Mariel Aloe, MD  metoprolol succinate (TOPROL-XL) 50 MG 24 hr tablet Take 1 tablet (50 mg total) by mouth in the morning and at bedtime. Take with or immediately following a meal. Patient taking  differently: Take 50 mg by mouth in the morning and at bedtime.  10/04/19  Yes Gollan, Kathlene November, MD  XARELTO 20 MG TABS tablet Take 20 mg by mouth daily with supper.  09/10/19  Yes [provider]    Review of Systems  Constitutional: Positive for fatigue (easily). Negative for appetite change.  HENT: Negative for congestion, postnasal drip and sore throat.   Eyes: Negative.   Respiratory: Positive for chest tightness (at times) and shortness of breath. Negative for cough.   Cardiovascular: Negative for chest pain, palpitations and leg swelling.  Gastrointestinal: Positive for abdominal distention. Negative for abdominal pain.  Endocrine: Negative.   Genitourinary: Negative.   Musculoskeletal: Positive for back pain. Negative for neck pain.  Skin: Negative.   Allergic/Immunologic: Negative.   Neurological: Negative for dizziness and light-headedness.  Hematological: Negative for adenopathy. Does not bruise/bleed easily.  Psychiatric/Behavioral: Negative for dysphoric mood and sleep disturbance (sleeping on 2 pillows). The patient is nervous/anxious.    Vitals:   10/13/19 1204 10/13/19 1226  BP: (!) 132/104 122/80  Pulse: (!) 116   Resp: 20   SpO2: 97%   Weight: 197 lb 6 oz (89.5 kg)   Height: 5\' 9"  (1.753 m)    Wt Readings from Last 3 Encounters:  10/13/19 197 lb 6 oz (89.5 kg)  10/10/19 195 lb (88.5 kg)  09/21/19 190 lb (86.2 kg)   Lab Results  Component Value Date   CREATININE 1.19 10/11/2019   CREATININE 1.81 (H) 10/10/2019   CREATININE 0.97 09/21/2019    Physical Exam Vitals and nursing note reviewed.  Constitutional:      Appearance: He is well-developed.  HENT:     Head: Normocephalic and atraumatic.  Neck:     Vascular: No JVD.  Cardiovascular:     Rate and Rhythm: Tachycardia present. Rhythm irregular.  Pulmonary:     Effort: Pulmonary effort is normal. No respiratory distress.     Breath sounds: No wheezing or rales.  Abdominal:     Palpations:  Abdomen is soft.     Tenderness: There is no abdominal tenderness.  Musculoskeletal:     Cervical back: Normal range of motion and neck supple.     Right lower leg: No tenderness. No edema.     Left lower leg: No tenderness. No edema.  Skin:    General: Skin is warm and dry.  Neurological:     General: No focal deficit present.     Mental Status: He is alert and oriented to person, place, and time.  Psychiatric:        Mood and Affect: Mood is anxious.        Behavior: Behavior normal.    Assessment & Plan:  1: Chronic heart failure with reduced ejection fraction- - NYHA class III - euvolemic today - weighing daily; reminded  to call for an overnight weight gain of >2 pounds or a weekly weight gain of >5 pounds - not adding salt to his food and tries to be aware of his sodium intake - does drink "a lot" fluids due to sweating while working outside - saw cardiology ([place]) 09/21/19 & returns tomorrow - will send message to cardiology about possibly starting entresto on patient - BNP 10/10/19 was 1007.1  2: Atrial fibrillation/ PE- - saw PCP Ouida Sills) 09/26/19 - quite tachycardic today; taking 150mg  metoprolol BID right now; not a candidate for corlanor due to AF - BMP 10/11/19 reviewed and showed sodium 140, potassium 3.5, creatinine 1.19 and GFR >60  3: Alcohol use- - + chews tobacco - drinks 3-4 beers daily (12 ounces each)  Medication list reviewed.   Return in 6 weeks or sooner for any questions/problems before then.

## 2019-10-13 ENCOUNTER — Ambulatory Visit: Payer: 59 | Attending: Family | Admitting: Family

## 2019-10-13 ENCOUNTER — Encounter: Payer: Self-pay | Admitting: Family

## 2019-10-13 ENCOUNTER — Other Ambulatory Visit: Payer: Self-pay

## 2019-10-13 VITALS — BP 122/80 | HR 116 | Resp 20 | Ht 69.0 in | Wt 197.4 lb

## 2019-10-13 DIAGNOSIS — F1010 Alcohol abuse, uncomplicated: Secondary | ICD-10-CM

## 2019-10-13 DIAGNOSIS — Z79899 Other long term (current) drug therapy: Secondary | ICD-10-CM | POA: Diagnosis not present

## 2019-10-13 DIAGNOSIS — I4891 Unspecified atrial fibrillation: Secondary | ICD-10-CM

## 2019-10-13 DIAGNOSIS — R0789 Other chest pain: Secondary | ICD-10-CM | POA: Diagnosis not present

## 2019-10-13 DIAGNOSIS — I4819 Other persistent atrial fibrillation: Secondary | ICD-10-CM | POA: Insufficient documentation

## 2019-10-13 DIAGNOSIS — R450 Nervousness: Secondary | ICD-10-CM | POA: Diagnosis not present

## 2019-10-13 DIAGNOSIS — Z720 Tobacco use: Secondary | ICD-10-CM | POA: Insufficient documentation

## 2019-10-13 DIAGNOSIS — R0602 Shortness of breath: Secondary | ICD-10-CM | POA: Insufficient documentation

## 2019-10-13 DIAGNOSIS — Z7901 Long term (current) use of anticoagulants: Secondary | ICD-10-CM | POA: Insufficient documentation

## 2019-10-13 DIAGNOSIS — M549 Dorsalgia, unspecified: Secondary | ICD-10-CM | POA: Diagnosis not present

## 2019-10-13 DIAGNOSIS — I5020 Unspecified systolic (congestive) heart failure: Secondary | ICD-10-CM

## 2019-10-13 DIAGNOSIS — R000 Tachycardia, unspecified: Secondary | ICD-10-CM | POA: Insufficient documentation

## 2019-10-13 DIAGNOSIS — R140 Abdominal distension (gaseous): Secondary | ICD-10-CM | POA: Insufficient documentation

## 2019-10-13 DIAGNOSIS — I5022 Chronic systolic (congestive) heart failure: Secondary | ICD-10-CM | POA: Insufficient documentation

## 2019-10-13 DIAGNOSIS — Z86711 Personal history of pulmonary embolism: Secondary | ICD-10-CM | POA: Diagnosis not present

## 2019-10-13 DIAGNOSIS — R5383 Other fatigue: Secondary | ICD-10-CM | POA: Diagnosis present

## 2019-10-13 NOTE — Patient Instructions (Signed)
Continue weighing daily and call for an overnight weight gain of > 2 pounds or a weekly weight gain of >5 pounds. 

## 2019-10-14 ENCOUNTER — Other Ambulatory Visit: Payer: Self-pay

## 2019-10-14 ENCOUNTER — Encounter: Payer: Self-pay | Admitting: Cardiovascular Disease

## 2019-10-14 ENCOUNTER — Ambulatory Visit (INDEPENDENT_AMBULATORY_CARE_PROVIDER_SITE_OTHER): Payer: 59 | Admitting: Cardiovascular Disease

## 2019-10-14 VITALS — BP 130/80 | HR 118 | Ht 69.0 in | Wt 202.0 lb

## 2019-10-14 DIAGNOSIS — I428 Other cardiomyopathies: Secondary | ICD-10-CM | POA: Diagnosis not present

## 2019-10-14 DIAGNOSIS — F1010 Alcohol abuse, uncomplicated: Secondary | ICD-10-CM

## 2019-10-14 DIAGNOSIS — I420 Dilated cardiomyopathy: Secondary | ICD-10-CM

## 2019-10-14 DIAGNOSIS — I5020 Unspecified systolic (congestive) heart failure: Secondary | ICD-10-CM

## 2019-10-14 DIAGNOSIS — I4891 Unspecified atrial fibrillation: Secondary | ICD-10-CM

## 2019-10-14 MED ORDER — DILTIAZEM HCL ER COATED BEADS 180 MG PO CP24
180.0000 mg | ORAL_CAPSULE | Freq: Every day | ORAL | 3 refills | Status: DC
Start: 2019-10-14 — End: 2020-02-25

## 2019-10-14 NOTE — Patient Instructions (Addendum)
Medication Instructions:  Your physician has recommended you make the following change in your medication:  1. RESTART Diltiazem ER 180 mg once a day 2. STAY on Metoprolol    In 3 weeks, we can set up cardioversion  If you need a refill on your cardiac medications before your next appointment, please call your pharmacy.    Lab work: No new labs needed   If you have labs (blood work) drawn today and your tests are completely normal, you will receive your results only by: Marland Kitchen MyChart Message (if you have MyChart) OR . A paper copy in the mail If you have any lab test that is abnormal or we need to change your treatment, we will call you to review the results.   Testing/Procedures: No new testing needed   Follow-Up: At [facility], you and your health needs are our priority.  As part of our continuing mission to provide you with exceptional heart care, we have created designated Provider Care Teams.  These Care Teams include your primary Cardiologist (physician) and Advanced Practice Providers (APPs -  Physician Assistants and Nurse Practitioners) who all work together to provide you with the care you need, when you need it.  . You will need a follow up appointment in 3 weeks with Dr. Rockey Situ   . Providers on your designated Care Team:   . Murray Hodgkins, NP . Christell Faith, PA-C . Marrianne Mood, PA-C  Any Other Special Instructions Will Be Listed Below (If Applicable).  COVID-19 Vaccine Information can be found at: ShippingScam.co.uk For questions related to vaccine distribution or appointments, please email [email or web address] [email or web address] or call [phone].

## 2019-10-14 NOTE — Progress Notes (Signed)
No current cardiology Office Note  Date:  10/14/2019   ID:  Calvin Esparza, DOB [birth year], MRN [id number]  PCP:  [name], MD   Chief Complaint  Patient presents with  . other    3 week follow up. Meds reviewed by the pt. verbally. Pt. c/o abdominal swelling and shortness of breath with exertion.     HPI:  Calvin Esparza is a 59 year old gentleman with past medical history of Alcohol abuse Chronic pain, knee pain Recently seen in the emergency room August 28, 2018 for chest pain, dizzy, atrial fibrillation Heart rate from 110 up to 130 bpm CTA showing PE lower lobe branch on the right March 2021 Atrial fibrillation March 2021 Who presents for follow-up of his paroxysmal atrial fibrillation  Last seen in clinic by myself Jul 25, 2019  Seen in emergency room October 10, 2019 A. fib with RVR Admission to the hospital Heatstroke, dehydration Was given IV fluids, heart rate improved, felt better, no significant medication changes were made  Today heart rate elevated 119 bpm Was elevated yesterday even on his metoprolol 150 twice daily Denies any lightheadedness or dizziness He does appreciate some palpitations, he is interested in restoring normal sinus rhythm Not on diltiazem secondary to contraindication with the colchicine  Long discussion concerning the colchicine, he is not having any gout flares, which is taking the colchicine for General arthritides  Discussed his work condition, plaque in the basement, 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, High risk of dehydration with his work  EKG personally reviewed by myself on todays visit Shows atrial fibrillation rate 118 bpm nonspecific ST abnormality  Last seen by myself June 21, 2019 Felt overwhelmed by the medications he was on Was changed to metoprolol succinate 150 mg in the evening  diltiazem 360 once a day in the evening Xarelto 20 daily  losartan in the evening He had mild leg swelling Plan is for repeat EKG possible  cardioversion in follow-up  Covid infection December 2020   PMH:   has a past medical history of Alcohol abuse, CHF (congestive heart failure) ([place]), Chronic pain, COVID-19, HFrEF (heart failure with reduced ejection fraction) ([place]), NICM (nonischemic cardiomyopathy) ([place]), Persistent atrial fibrillation ([place]), and Pulmonary embolism ([place]).  PSH:    Past Surgical History:  Procedure Laterality Date  . CARDIOVERSION N/A 07/29/2019   Procedure: CARDIOVERSION;  Surgeon: Minna Merritts, MD;  Location: [facility] ORS;  Service: Cardiovascular;  Laterality: N/A;    Current Outpatient Medications  Medication Sig Dispense Refill  . colchicine-probenecid 0.5-500 MG tablet Take 1 tablet by mouth 2 (two) times daily.    . furosemide (LASIX) 20 MG tablet PRN for increased leg swelling, weight gain of 3 lbs over one day    . metoprolol succinate (TOPROL-XL) 100 MG 24 hr tablet Take 1 tablet (100 mg total) by mouth in the morning and at bedtime. Take with or immediately following a meal. 180 tablet 3  . metoprolol succinate (TOPROL-XL) 50 MG 24 hr tablet Take 1 tablet (50 mg total) by mouth in the morning and at bedtime. Take with or immediately following a meal. (Patient taking differently: Take 50 mg by mouth in the morning and at bedtime. ) 180 tablet 3  . XARELTO 20 MG TABS tablet Take 20 mg by mouth daily with supper.      No current facility-administered medications for this visit.    Allergies:   Patient has no known allergies.   Social History:  The patient  reports that he has  never smoked. His smokeless tobacco use includes chew. He reports current alcohol use of about 24.0 standard drinks of alcohol per week. He reports previous drug use.   Family History:   family history includes Bone cancer in his father; Diabetes in his sister; Stroke in his father.    Review of Systems: Review of Systems  Constitutional: Negative.   HENT: Negative.   Respiratory: Negative.   Cardiovascular:  Positive for palpitations.  Gastrointestinal: Negative.   Musculoskeletal: Negative.   Neurological: Negative.   Psychiatric/Behavioral: Negative.   All other systems reviewed and are negative.   PHYSICAL EXAM: VS:  BP (!) 130/80 (BP Location: Left Arm, Patient Position: Sitting, Cuff Size: Normal)   Pulse (!) 118   Ht 5\' 9"  (1.753 m)   Wt 202 lb (91.6 kg)   SpO2 98%   BMI 29.83 kg/m  , BMI Body mass index is 29.83 kg/m. Constitutional:  oriented to person, place, and time. No distress.  HENT:  Head: Grossly normal Eyes:  no discharge. No scleral icterus.  Neck: No JVD, no carotid bruits  Cardiovascular: Irregularly irregular no murmurs appreciated Pulmonary/Chest: Clear to auscultation bilaterally, no wheezes or rails Abdominal: Soft.  no distension.  no tenderness.  Musculoskeletal: Normal range of motion Neurological:  normal muscle tone. Coordination normal. No atrophy Skin: Skin warm and dry Psychiatric: normal affect, pleasant  Recent Labs: 06/07/2019: TSH 1.823 09/09/2019: ALT 30 10/10/2019: B Natriuretic Peptide 1,007.1; Magnesium 2.0 10/11/2019: BUN 19; Creatinine, Ser 1.19; Hemoglobin 12.7; Platelets 159; Potassium 3.5; Sodium 140    Lipid Panel No results found for: CHOL, HDL, LDLCALC, TRIG    Wt Readings from Last 3 Encounters:  10/14/19 202 lb (91.6 kg)  10/13/19 197 lb 6 oz (89.5 kg)  10/10/19 195 lb (88.5 kg)     ASSESSMENT AND PLAN:  Problem List Items Addressed This Visit      Cardiology Problems   Atrial fibrillation with RVR ([place]) - Primary   Dilated cardiomyopathy ([place])     Other   Alcohol abuse    Other Visit Diagnoses    HFrEF (heart failure with reduced ejection fraction) (HCC)       Nonischemic cardiomyopathy (HCC)         Atrial fibrillation, persistent Recommend he restart the diltiazem ER 180 daily and stop the colchicine, he is okay with this Cardiomyopathy secondary to tachycardia mediated and stressed him the importance of  restoring normal sinus rhythm and controlling his rate for now -No indication for Lasix given recent dehydration and heat stroke with hospitalization -Given prior cardioversion I did not hold, will likely need antiarrhythmic Given low ejection fraction would consider giving amiodarone preprocedure/cardioversion  Essential hypertension Medication changes as above  Pulmonary embolism On anticoagulation He did miss one dose of Xarelto when he was in the hospital,  Cardiomyopathy  avoid alcohol All attempts should be made to restore normal sinus rhythm Likely tachycardia mediated  Disposition:   F/U 3 weeks   Total encounter time more than 25 minutes  Greater than 50% was spent in counseling and coordination of care with the patient    Signed, Calvin Esparza, M.D., Ph.D. [place], [place]

## 2019-10-20 ENCOUNTER — Telehealth: Payer: Self-pay | Admitting: Cardiovascular Disease

## 2019-10-20 NOTE — Telephone Encounter (Signed)
DPR on file spoke with the patients wife. The patient does not weigh daily. Patient wife sts that the patient has not complained of sob, orthopnea, PND, weight gain, LE swelling. He usually holds on to fluid on his abdomen.  Patient wife is not sure if he is taking lasix 20 mg prn daily. Adv the patients wife that the patient should take lasix 20 mg daily prn for his swelling. He can take it more than one day in a row if he needs it.  Adv her to have the patient keep his 11/04/19 appt with Dr. Rockey Situ. Patient is to contact the office sooner if symptoms worsen. Patients wife agreeable with the plan and voiced appreciation for the call.

## 2019-10-20 NOTE — Telephone Encounter (Signed)
Patient spouse calling  States that due to the bloating in his stomach would it be ok to take extra lasix medication  Please call to discuss

## 2019-11-04 ENCOUNTER — Ambulatory Visit: Payer: 59 | Admitting: Cardiovascular Disease

## 2019-11-17 ENCOUNTER — Ambulatory Visit: Payer: 59 | Admitting: Nurse Practitioner

## 2019-11-17 ENCOUNTER — Ambulatory Visit: Payer: 59 | Admitting: Family

## 2019-11-28 ENCOUNTER — Emergency Department
Admission: EM | Admit: 2019-11-28 | Discharge: 2019-11-28 | Disposition: A | Discharge status: Home / Self Care | Payer: 59 | Attending: Emergency Medicine | Admitting: Emergency Medicine

## 2019-11-28 ENCOUNTER — Emergency Department: Payer: 59

## 2019-11-28 ENCOUNTER — Other Ambulatory Visit: Payer: Self-pay

## 2019-11-28 DIAGNOSIS — Z79899 Other long term (current) drug therapy: Secondary | ICD-10-CM | POA: Insufficient documentation

## 2019-11-28 DIAGNOSIS — I509 Heart failure, unspecified: Secondary | ICD-10-CM

## 2019-11-28 DIAGNOSIS — I5043 Acute on chronic combined systolic (congestive) and diastolic (congestive) heart failure: Secondary | ICD-10-CM | POA: Insufficient documentation

## 2019-11-28 DIAGNOSIS — R0602 Shortness of breath: Secondary | ICD-10-CM | POA: Diagnosis present

## 2019-11-28 DIAGNOSIS — I4891 Unspecified atrial fibrillation: Secondary | ICD-10-CM | POA: Diagnosis not present

## 2019-11-28 LAB — CBC
HCT: 40.5 % (ref 39.0–52.0)
Hemoglobin: 13.4 g/dL (ref 13.0–17.0)
MCH: 31.1 pg (ref 26.0–34.0)
MCHC: 33.1 g/dL (ref 30.0–36.0)
MCV: 94 fL (ref 80.0–100.0)
Platelets: 178 10*3/uL (ref 150–400)
RBC: 4.31 MIL/uL (ref 4.22–5.81)
RDW: 14.6 % (ref 11.5–15.5)
WBC: 6.1 10*3/uL (ref 4.0–10.5)
nRBC: 0 % (ref 0.0–0.2)

## 2019-11-28 LAB — BASIC METABOLIC PANEL
Anion gap: 12 (ref 5–15)
BUN: 14 mg/dL (ref 6–20)
CO2: 27 mmol/L (ref 22–32)
Calcium: 9.2 mg/dL (ref 8.9–10.3)
Chloride: 104 mmol/L (ref 98–111)
Creatinine, Ser: 1.12 mg/dL (ref 0.61–1.24)
GFR calc Af Amer: >60 mL/min (ref >60)
GFR calc non Af Amer: >60 mL/min (ref >60)
Glucose, Bld: 153 mg/dL — ABNORMAL HIGH (ref 70–99)
Potassium: 3.8 mmol/L (ref 3.5–5.1)
Sodium: 143 mmol/L (ref 135–145)

## 2019-11-28 LAB — MAGNESIUM: Magnesium: 1.9 mg/dL (ref 1.7–2.4)

## 2019-11-28 LAB — TROPONIN I (HIGH SENSITIVITY): Troponin I (High Sensitivity): 7 ng/L (ref <18)

## 2019-11-28 LAB — BRAIN NATRIURETIC PEPTIDE: B Natriuretic Peptide: 1033.4 pg/mL — ABNORMAL HIGH (ref 0.0–100.0)

## 2019-11-28 MED ORDER — METOPROLOL SUCCINATE ER 50 MG PO TB24
50.0000 mg | ORAL_TABLET | Freq: Every day | ORAL | Status: DC
  Administered 2019-11-28: 50 mg via ORAL
  Filled 2019-11-28: qty 1

## 2019-11-28 MED ORDER — DILTIAZEM HCL 25 MG/5ML IV SOLN
5.0000 mg | Freq: Once | INTRAVENOUS | Status: AC
  Administered 2019-11-28: 5 mg via INTRAVENOUS
  Filled 2019-11-28: qty 5

## 2019-11-28 MED ORDER — FUROSEMIDE 10 MG/ML IJ SOLN
40.0000 mg | Freq: Once | INTRAMUSCULAR | Status: AC
  Administered 2019-11-28: 40 mg via INTRAVENOUS
  Filled 2019-11-28: qty 4

## 2019-11-28 MED ORDER — DILTIAZEM HCL ER COATED BEADS 180 MG PO CP24
180.0000 mg | ORAL_CAPSULE | Freq: Once | ORAL | Status: AC
  Administered 2019-11-28: 180 mg via ORAL
  Filled 2019-11-28: qty 1

## 2019-11-28 MED ORDER — METOPROLOL SUCCINATE ER 50 MG PO TB24
100.0000 mg | ORAL_TABLET | Freq: Every day | ORAL | Status: DC
  Administered 2019-11-28: 100 mg via ORAL
  Filled 2019-11-28: qty 2

## 2019-11-28 NOTE — ED Provider Notes (Signed)
[facility] Emergency Department Provider Note ____________________________________________   First MD Initiated Contact with Patient 11/28/19 2101     (approximate)  I have reviewed the triage vital signs and the nursing notes.   HISTORY  Chief Complaint Shortness of Breath    HPI Calvin Esparza is a 59 y.o. male with PMH as noted below including atrial fibrillation and CHF who presents with worsened shortness of breath over the last several days, especially at night, associated with orthopnea and paroxysmal nocturnal dyspnea. The patient is also aware that his heart rate has been elevated for several days, however he denies any chest pain. He has no cough or fever. He reports minimal leg swelling. The patient is on 20 mg of Lasix although states he does not take it every day. He is also on metoprolol and is supposed to be on diltiazem, but he knows that it has an interaction with colchicine and so on days when he has joint pain and decided to take colchicine, he does not take the diltiazem. He has not taken it every day in the last few days.   Past Medical History:  Diagnosis Date  . A-fib ([place])   . Alcohol abuse   . CHF (congestive heart failure) ([place])   . Chronic pain   . COVID-19    a. 02/2019  . HFrEF (heart failure with reduced ejection fraction) ([place])    a. 05/2019 Echo: EF 35-40%.  Marland Kitchen NICM (nonischemic cardiomyopathy) ([place])    a. 05/2019 Echo: EF 35-40%, gr1 DD. Mod enlarged RV. Mildly dil LA. Mild to mod dil RA. Triv AI; b. 05/2019 MV: EF 32%, no ischemia.  . Persistent atrial fibrillation ([place])    a. Dx 05/2019 in setting of PE. CHA2DS2VASc = 1-->Eliquis later changed to xarelto; b. 07/2019 s/p DCCV (150J).  . Pulmonary embolism ([place])    a. 05/2019 CTA Chest: mild amt of PE w/in a lower lobe branch of RPA; b. 08/2019 CTA Chest: Small PE in RML and RLL PA.    Patient Active Problem List   Diagnosis Date Noted  . Pre-syncope 10/10/2019  . Acute on  chronic HFrEF (heart failure with reduced ejection fraction) ([place])   . Persistent atrial fibrillation ([place])   . Dilated cardiomyopathy ([place]) 06/09/2019  . Chronic combined systolic and diastolic CHF (congestive heart failure) ([place]) 06/08/2019  . Acute pulmonary embolism ([place]) 06/07/2019  . Atrial fibrillation with RVR ([place]) 10/01/2018  . Alcohol abuse 10/01/2018    Past Surgical History:  Procedure Laterality Date  . CARDIOVERSION N/A 07/29/2019   Procedure: CARDIOVERSION;  Surgeon: Minna Merritts, MD;  Location: [facility] ORS;  Service: Cardiovascular;  Laterality: N/A;    Prior to Admission medications   Medication Sig Start Date End Date Taking? Authorizing Provider  colchicine-probenecid 0.5-500 MG tablet Take 1 tablet by mouth 2 (two) times daily. 09/26/19   [provider]  diltiazem (CARDIZEM CD) 180 MG 24 hr capsule Take 1 capsule (180 mg total) by mouth daily. 10/14/19 01/12/20  Minna Merritts, MD  furosemide (LASIX) 20 MG tablet PRN for increased leg swelling, weight gain of 3 lbs over one day 10/11/19   Mariel Aloe, MD  metoprolol succinate (TOPROL-XL) 100 MG 24 hr tablet Take 1 tablet (100 mg total) by mouth in the morning and at bedtime. Take with or immediately following a meal. 10/11/19   Mariel Aloe, MD  metoprolol succinate (TOPROL-XL) 50 MG 24 hr tablet Take 1 tablet (50 mg  total) by mouth in the morning and at bedtime. Take with or immediately following a meal. Patient taking differently: Take 50 mg by mouth in the morning and at bedtime.  10/04/19   Gollan, Kathlene November, MD  XARELTO 20 MG TABS tablet Take 20 mg by mouth daily with supper.  09/10/19   [provider]    Allergies Patient has no known allergies.  Family History  Problem Relation Age of Onset  . Bone cancer Father   . Stroke Father   . Diabetes Sister     Social History Social History   Tobacco Use  . Smoking status: Never Smoker  . Smokeless tobacco: Current User    Types:  Chew  Vaping Use  . Vaping Use: Never used  Substance Use Topics  . Alcohol use: Yes    Alcohol/week: 24.0 standard drinks    Types: 24 Cans of beer per week    Comment: 6 beers daily, weekend drinks more-  . Drug use: Not Currently    Review of Systems  Constitutional: No fever/chills. Eyes: No visual changes. ENT: No sore throat. Cardiovascular: Denies chest pain. Respiratory: Positive for shortness of breath. Gastrointestinal: No vomiting or diarrhea.  Genitourinary: Negative for dysuria.  Musculoskeletal: Negative for back pain. Skin: Negative for rash. Neurological: Negative for headache.   ____________________________________________   PHYSICAL EXAM:  VITAL SIGNS: ED Triage Vitals  Enc Vitals Group     BP 11/28/19 1350 (!) 160/113     Pulse Rate 11/28/19 1350 (!) 119     Resp 11/28/19 1350 19     Temp 11/28/19 1402 98.2 F (36.8 C)     Temp Source 11/28/19 1350 Oral     SpO2 11/28/19 1350 94 %     Weight --      Height --      Head Circumference --      Peak Flow --      Pain Score 11/28/19 1358 0     Pain Loc --      Pain Edu? --      Excl. in [place]? --     Constitutional: Alert and oriented. Well appearing and in no acute distress. Eyes: Conjunctivae are normal.  Head: Atraumatic. Nose: No congestion/rhinnorhea. Mouth/Throat: Mucous membranes are moist.   Neck: Normal range of motion.  Cardiovascular: Tachycardic, irregular rhythm. Grossly normal heart sounds.  Good peripheral circulation. Respiratory: Normal respiratory effort.  No retractions. Lungs CTAB. Gastrointestinal: No distention.  Musculoskeletal: Trace bilateral lower extremity edema.  Extremities warm and well perfused.  Neurologic:  Normal speech and language. No gross focal neurologic deficits are appreciated.  Skin:  Skin is warm and dry. No rash noted. Psychiatric: Mood and affect are normal. Speech and behavior are normal.  ____________________________________________    LABS (all labs ordered are listed, but only abnormal results are displayed)  Labs Reviewed  BASIC METABOLIC PANEL - Abnormal; Notable for the following components:      Result Value   Glucose, Bld 153 (*)    All other components within normal limits  BRAIN NATRIURETIC PEPTIDE - Abnormal; Notable for the following components:   B Natriuretic Peptide 1,033.4 (*)    All other components within normal limits  CBC  MAGNESIUM  TROPONIN I (HIGH SENSITIVITY)  TROPONIN I (HIGH SENSITIVITY)   ____________________________________________  EKG  ED ECG REPORT I, Arta Silence, the attending physician, personally viewed and interpreted this ECG.  Date: 11/28/2019 EKG Time: 1350 Rate: 123 Rhythm: Atrial fibrillation with RVR  QRS Axis: normal Intervals: normal ST/T Wave abnormalities: Nonspecific abnormalities Narrative Interpretation: Atrial fibrillation with RVR; no evidence of acute ischemia  ____________________________________________  RADIOLOGY  CXR: No focal infiltrate or significant edema.  ____________________________________________   PROCEDURES  Procedure(s) performed: No  Procedures  Critical Care performed: No ____________________________________________   INITIAL IMPRESSION / ASSESSMENT AND PLAN / ED COURSE  Pertinent labs & imaging results that were available during my care of the patient were reviewed by me and considered in my medical decision making (see chart for details).  59 year old male with PMH as noted above including a history of CHF and atrial fibrillation presents with increased shortness of breath over the last several days especially at night and when lying flat.  He also has noted that he has had rapid atrial fibrillation.  I reviewed the past medical records in Epic; the patient follows with Dr. Rockey Situ from cardiology and was last seen in July.  He is on metoprolol daily and is supposed to take diltiazem in the evening although is aware  that it interacts with colchicine, and it appears that he takes it somewhat inconsistently.  It is also not clear that he is taking his Lasix consistently.  On exam, the patient is overall well-appearing.  His vital signs are normal except for mild hypertension and tachycardia to the 120s.  O2 saturation is normal.  He does not have significant dyspnea or increased work of breathing and there is trace peripheral edema although the patient reports that he tends to hold fluid more in his abdominal wall.  Overall presentation is consistent with mild CHF exacerbation, with some of the symptoms likely related to his atrial fibrillation.  I suspect that this is because of his inconsistent compliance with medication.  I have a low suspicion for ACS.  Of note, due to ED volume and staffing issues, the patient waited for approximately 8 hours prior to being placed in an exam room.  During this time chest x-ray was obtained which shows mild atelectasis but no marked edema.  His BNP is elevated.  Other labs are within normal limits, and I added on a troponin which was negative.    We will give a dose of IV Lasix, a dose of diltiazem, and reassess.  Given the patient is oxygenating well and overall appears comfortable, anticipate that he will be stable for discharge home.  ----------------------------------------- 11:31 PM on 11/28/2019 -----------------------------------------  Patient appears comfortable and is making a good amount of urine.  Troponin is negative.  His heart rate is now in the 90s.  At this time, he is stable for discharge.  I counseled him on the results of the work-up and advised him to be consistent with his diltiazem and Lasix.  He agrees to follow-up with his cardiologist Dr. Rockey Situ.  Return precautions given, and he expresses understanding. ____________________________________________   FINAL CLINICAL IMPRESSION(S) / ED DIAGNOSES  Final diagnoses:  Acute on chronic congestive heart  failure, unspecified heart failure type ([place])  Atrial fibrillation, unspecified type ([place])      NEW MEDICATIONS STARTED DURING THIS VISIT:  Discharge Medication List as of 11/28/2019 11:24 PM       Note:  This document was prepared using Dragon voice recognition software and may include unintentional dictation errors.    Arta Silence, MD 11/28/19 [phone]

## 2019-11-28 NOTE — Discharge Instructions (Addendum)
Continue taking your metoprolol and diltiazem as prescribed.  You should also take the Lasix twice daily for the next several days, and then back to your normal normal dose of 20 mg daily.  Call Dr. Rockey Situ to arrange for follow-up within the next few weeks.  Return to the ER for new, worsening, or persistent severe shortness of breath, chest pain, weakness, or any other new or worsening symptoms that concern you.

## 2019-11-28 NOTE — ED Triage Notes (Signed)
Pt c/o feeling more SOB when he tries to lay down at night, states he takes lasix with a-fib. Pt is in NAD at present. Respirations WNl. Speaking in complete sentences

## 2019-12-02 NOTE — Progress Notes (Signed)
No current cardiology Office Note  Date:  12/05/2019   ID:  Calvin Esparza, DOB January 29, 1961, MRN [id number]  PCP:  [name], MD   Chief Complaint  Patient presents with  . other    F/u ED CHF/AFIB/sob. Meds reviewed verbally with pt.    HPI:  [name] is a 59 year old gentleman with past medical history of Alcohol abuse Chronic pain, knee pain Recently seen in the emergency room August 28, 2018 for chest pain, dizzy, atrial fibrillation Heart rate from 110 up to 130 bpm CTA showing PE lower lobe branch on the right March 2021 Atrial fibrillation March 2021 Who presents for follow-up of his paroxysmal atrial fibrillation  Last seen in clinic by myself Jul 25, 2019 Atrial fibrillation at that time  Seen in emergency room October 10, 2019 A. 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, 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, 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 of 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, 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 Worsening shortness of breath recently, very high fluid intake Was not taking his Lasix Has been taking 2 Lasix a day total of 40 mg since he was seen in the emergency room, abdomen less tight, breathing somewhat better  EKG personally reviewed by myself on todays visit Shows atrial fibrillation rate 83 bpm nonspecific ST abnormality  Last seen by myself June 21, 2019 Felt overwhelmed by the medications  he was on Was changed to metoprolol succinate 150 mg in the evening  diltiazem 360 once a day in the evening Xarelto 20 daily  losartan in the evening He had mild leg swelling Plan is for repeat EKG possible cardioversion in follow-up  Covid infection December 2020   PMH:   has a past medical history of A-fib ([place]), Alcohol abuse, CHF (congestive heart failure) ([place]), Chronic pain, COVID-19, HFrEF (heart failure with reduced ejection fraction) ([place]), NICM (nonischemic cardiomyopathy) ([place]), Persistent atrial fibrillation ([place]), and Pulmonary embolism ([place]).  PSH:    Past Surgical History:  Procedure Laterality Date  . CARDIOVERSION N/A 07/29/2019   Procedure: CARDIOVERSION;  Surgeon: Minna Merritts, MD;  Location: [facility] ORS;  Service: Cardiovascular;  Laterality: N/A;    Current Outpatient Medications  Medication Sig Dispense Refill  . colchicine-probenecid 0.5-500 MG tablet Take 1 tablet by mouth 2 (two) times daily.    Marland Kitchen diltiazem (CARDIZEM CD) 180 MG 24 hr capsule Take 1 capsule (180 mg total) by mouth daily. 90 capsule 3  . furosemide (LASIX) 20 MG tablet PRN for increased leg swelling, weight gain of 3 lbs over one day    . metoprolol succinate (TOPROL-XL) 100 MG 24 hr tablet Take 1 tablet (100 mg total) by mouth in the morning and at bedtime. Take with or immediately following a meal. 180 tablet 3  . metoprolol succinate (TOPROL-XL) 50 MG 24 hr tablet Take 1 tablet (50 mg total) by mouth in the morning and at bedtime. Take with or immediately  following a meal. (Patient taking differently: Take 50 mg by mouth in the morning and at bedtime. ) 180 tablet 3  . XARELTO 20 MG TABS tablet Take 20 mg by mouth daily with supper.      No current facility-administered medications for this visit.    Allergies:   Patient has no known allergies.   Social History:  The patient  reports that he has never smoked. His smokeless tobacco use includes chew. He reports current alcohol use of about  24.0 standard drinks of alcohol per week. He reports previous drug use.   Family History:   family history includes Bone cancer in his father; Diabetes in his sister; Stroke in his father.    Review of Systems: Review of Systems  Constitutional: Negative.   HENT: Negative.   Respiratory: Negative.   Cardiovascular: Positive for palpitations.  Gastrointestinal: Negative.        Mild abdominal distention  Musculoskeletal: Negative.   Neurological: Negative.   Psychiatric/Behavioral: Negative.   All other systems reviewed and are negative.   PHYSICAL EXAM: VS:  BP 120/80 (BP Location: Left Arm, Patient Position: Sitting, Cuff Size: Normal)   Pulse 83   Ht 5\' 8"  (1.727 m)   Wt 191 lb 8 oz (86.9 kg)   SpO2 98%   BMI 29.12 kg/m  , BMI Body mass index is 29.12 kg/m. Constitutional:  oriented to person, place, and time. No distress.  HENT:  Head: Grossly normal Eyes:  no discharge. No scleral icterus.  Neck: No JVD, no carotid bruits  Cardiovascular: Irregularly irregular no murmurs appreciated Pulmonary/Chest: Clear to auscultation bilaterally, no wheezes or rails Abdominal: Soft.  no distension.  no tenderness.  Musculoskeletal: Normal range of motion Neurological:  normal muscle tone. Coordination normal. No atrophy Skin: Skin warm and dry Psychiatric: normal affect, pleasant  Recent Labs: 06/07/2019: TSH 1.823 09/09/2019: ALT 30 11/28/2019: B Natriuretic Peptide 1,033.4; BUN 14; Creatinine, Ser 1.12; Hemoglobin 13.4; Magnesium 1.9; Platelets 178; Potassium 3.8; Sodium 143    Lipid Panel No results found for: CHOL, HDL, LDLCALC, TRIG    Wt Readings from Last 3 Encounters:  12/05/19 191 lb 8 oz (86.9 kg)  10/14/19 202 lb (91.6 kg)  10/13/19 197 lb 6 oz (89.5 kg)     ASSESSMENT AND PLAN:  Problem List Items Addressed This Visit      Cardiology Problems   Atrial fibrillation with RVR (HCC) - Primary   Chronic combined systolic and diastolic CHF (congestive heart  failure) (HCC)   Persistent atrial fibrillation ([place])   Acute pulmonary embolism (HCC)   Dilated cardiomyopathy ([place])    Other Visit Diagnoses    Nonischemic cardiomyopathy ([place])       HFrEF (heart failure with reduced ejection fraction) ([place])       Multiple subsegmental pulmonary emboli without acute cor pulmonale (HCC)         Atrial fibrillation, persistent Continue diltiazem 180 mg daily with metoprolol succinate 150 twice daily, rate well controlled, tolerating Xarelto Currently not working, feels tired Some issues with fluid retention, high fluid intake Recommended he cut back on his fluids, will make Lasix 40 daily with potassium 20 daily Hold the Lasix for resolution of his abdominal swelling, dry mouth etc.  Essential hypertension Blood pressure is well controlled on today's visit. No changes made to the medications.  Pulmonary embolism On anticoagulation On Xarelto  Cardiomyopathy  avoid alcohol In atrial fibrillation Consider repeat echocardiogram   Total encounter time more than 25 minutes  Greater than 50% was spent in counseling and coordination of care with the patient    Signed, Esmond Plants, M.D., Ph.D. [place], [place]

## 2019-12-05 ENCOUNTER — Ambulatory Visit (INDEPENDENT_AMBULATORY_CARE_PROVIDER_SITE_OTHER): Payer: 59 | Admitting: Cardiovascular Disease

## 2019-12-05 ENCOUNTER — Other Ambulatory Visit: Payer: Self-pay

## 2019-12-05 ENCOUNTER — Encounter: Payer: Self-pay | Admitting: Cardiovascular Disease

## 2019-12-05 VITALS — BP 120/80 | HR 83 | Ht 68.0 in | Wt 191.5 lb

## 2019-12-05 DIAGNOSIS — I4819 Other persistent atrial fibrillation: Secondary | ICD-10-CM

## 2019-12-05 DIAGNOSIS — I2694 Multiple subsegmental pulmonary emboli without acute cor pulmonale: Secondary | ICD-10-CM

## 2019-12-05 DIAGNOSIS — I5042 Chronic combined systolic (congestive) and diastolic (congestive) heart failure: Secondary | ICD-10-CM

## 2019-12-05 DIAGNOSIS — I5020 Unspecified systolic (congestive) heart failure: Secondary | ICD-10-CM

## 2019-12-05 DIAGNOSIS — I420 Dilated cardiomyopathy: Secondary | ICD-10-CM | POA: Diagnosis not present

## 2019-12-05 DIAGNOSIS — I428 Other cardiomyopathies: Secondary | ICD-10-CM | POA: Diagnosis not present

## 2019-12-05 DIAGNOSIS — I4891 Unspecified atrial fibrillation: Secondary | ICD-10-CM

## 2019-12-05 DIAGNOSIS — I2699 Other pulmonary embolism without acute cor pulmonale: Secondary | ICD-10-CM

## 2019-12-05 MED ORDER — FUROSEMIDE 40 MG PO TABS: ORAL_TABLET | ORAL | 1 refills | Status: DC

## 2019-12-05 MED ORDER — XARELTO 20 MG PO TABS: 20.0000 mg | ORAL_TABLET | Freq: Every day | ORAL | 2 refills | Status: DC

## 2019-12-05 MED ORDER — POTASSIUM CHLORIDE CRYS ER 20 MEQ PO TBCR
20.0000 meq | EXTENDED_RELEASE_TABLET | Freq: Every day | ORAL | 3 refills | Status: DC
Start: 2019-12-05 — End: 2020-05-02

## 2019-12-05 NOTE — Addendum Note (Signed)
Addended by: Britt Bottom on: 12/05/2019 01:52 PM   Modules accepted: Orders

## 2019-12-05 NOTE — Patient Instructions (Addendum)
Medication Instructions:  Continue lasix 40 daily Take with potassium 20 meq daily Hold both for a day if  dry mouth, ABD normal   If you need a refill on your cardiac medications before your next appointment, please call your pharmacy.    Lab work: No new labs needed   If you have labs (blood work) drawn today and your tests are completely normal, you will receive your results only by: Marland Kitchen MyChart Message (if you have MyChart) OR . A paper copy in the mail If you have any lab test that is abnormal or we need to change your treatment, we will call you to review the results.   Testing/Procedures: No new testing needed   Follow-Up: At [facility], you and your health needs are our priority.  As part of our continuing mission to provide you with exceptional heart care, we have created designated Provider Care Teams.  These Care Teams include your primary Cardiologist (physician) and Advanced Practice Providers (APPs -  Physician Assistants and Nurse Practitioners) who all work together to provide you with the care you need, when you need it.  . You will need a follow up appointment in 6 months  . Providers on your designated Care Team:   . Murray Hodgkins, NP . Christell Faith, PA-C . Marrianne Mood, PA-C  Any Other Special Instructions Will Be Listed Below (If Applicable).  COVID-19 Vaccine Information can be found at: ShippingScam.co.uk For questions related to vaccine distribution or appointments, please email [email or web address] or call [phone].

## 2019-12-09 NOTE — Progress Notes (Signed)
Patient ID: Calvin Esparza, male    DOB: [birth year], 59 y.o.   MRN: [id number]  HPI  Calvin Esparza is a 59 y/o male with a history of atrial fibrillation, PE, alcohol use, tobacco use (chewing tobacco) and chronic heart failure.   Echo report from 06/08/19 reviewed and showed an EF of 35-40% along with mild LVH and mild LAE.   Was in the ED 11/28/19 due to shortness of breath. Has been not taking diltiazem on days when Calvin Esparza takes colchicine. Given dose of IV lasix and diltiazem with good urine output so Calvin Esparza was released. Was in the ED 10/10/19 due to weakness, palpitations and near syncope while at work. EKG showed AF with RVR along with AKI. IVF given and cardiology consulted. Initially placed on cardizem drip and then transitioned to oral metoprolol. Released the following day. Admitted 09/08/19 due to shortness of breath and leg pain. Cardiology consult obtained. Had not been taking his xarelto. Initially placed on cardizem and heparin drips and then transitioned to oral medications. Previous cardioversion ~ 1 month ago. Chest CT showed continued PE and leg ultrasound showed DVT throughout entire left thigh. Placed back on xarelto. Discharged after 2 days.    Calvin Esparza presents today for a follow-up visit with a chief complaint of moderate shortness of breath with moderate exertion. Calvin Esparza says that this has been chronic in nature having been present for several months. Does report feeling short of breath upon walking to the office today. Calvin Esparza has associated abdominal distention, anxiety, chronic back pain and increasing weight along with this. Calvin Esparza denies any dizziness, palpitations, pedal edema, chest pain, cough or fatigue.   Has been taking 40mg  lasix daily without improvement. Was supposed to start potassium but Calvin Esparza hasn't picked it up yet as Calvin Esparza looked through his medications at home and was previously taking losartan and since the bottle said losartan potassium, Calvin Esparza thought that was his potassium supplement.    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   Past Medical History:  Diagnosis Date  . A-fib ([place])   . Alcohol abuse   . CHF (congestive heart failure) ([place])   . Chronic pain   . COVID-19    a. 02/2019  . HFrEF (heart failure with reduced ejection fraction) ([place])    a. 05/2019 Echo: EF 35-40%.  Marland Kitchen NICM (nonischemic cardiomyopathy) ([place])    a. 05/2019 Echo: EF 35-40%, gr1 DD. Mod enlarged RV. Mildly dil LA. Mild to mod dil RA. Triv AI; b. 05/2019 MV: EF 32%, no ischemia.  . Persistent atrial fibrillation ([place])    a. Dx 05/2019 in setting of PE. CHA2DS2VASc = 1-->Eliquis later changed to xarelto; b. 07/2019 s/p DCCV (150J).  . Pulmonary embolism ([place])    a. 05/2019 CTA Chest: mild amt of PE w/in a lower lobe branch of RPA; b. 08/2019 CTA Chest: Small PE in RML and RLL PA.   Past Surgical History:  Procedure Laterality Date  . CARDIOVERSION N/A 07/29/2019   Procedure: CARDIOVERSION;  Surgeon: Minna Merritts, MD;  Location: [facility] ORS;  Service: Cardiovascular;  Laterality: N/A;   Family History  Problem Relation Age of Onset  . Bone cancer Father   . Stroke Father   . Diabetes Sister    Social History   Tobacco Use  . Smoking status: Never Smoker  . Smokeless tobacco: Current User    Types: Chew  Substance Use Topics  . Alcohol use: Yes  Alcohol/week: 24.0 standard drinks    Types: 24 Cans of beer per week    Comment: 6 beers daily, weekend drinks more-   No Known Allergies  Prior to Admission medications   Medication Sig Start Date End Date Taking? Authorizing Provider  colchicine-probenecid 0.5-500 MG tablet Take 1 tablet by mouth 2 (two) times daily. 09/26/19  Yes [provider]  diltiazem (CARDIZEM CD) 180 MG 24 hr capsule Take 1 capsule (180 mg total) by mouth daily. 10/14/19 01/12/20 Yes Gollan, Kathlene November, MD  furosemide (LASIX) 40 MG tablet Daily for increased leg swelling, weight gain of 3 lbs 12/05/19  Yes  Gollan, Kathlene November, MD  losartan (COZAAR) 25 MG tablet Take 25 mg by mouth daily.   Yes [provider]  metoprolol succinate (TOPROL-XL) 100 MG 24 hr tablet Take 1 tablet (100 mg total) by mouth in the morning and at bedtime. Take with or immediately following a meal. 10/11/19  Yes Mariel Aloe, MD  metoprolol succinate (TOPROL-XL) 50 MG 24 hr tablet Take 1 tablet (50 mg total) by mouth in the morning and at bedtime. Take with or immediately following a meal. Patient taking differently: Take 50 mg by mouth in the morning and at bedtime.  10/04/19  Yes Gollan, Kathlene November, MD  XARELTO 20 MG TABS tablet Take 1 tablet (20 mg total) by mouth daily with supper. 12/05/19  Yes Gollan, Kathlene November, MD  potassium chloride SA (KLOR-CON) 20 MEQ tablet Take 1 tablet (20 mEq total) by mouth daily. Patient not taking: Reported on 12/12/2019 12/05/19   Minna Merritts, MD    Review of Systems  Constitutional: Negative for appetite change and fatigue.  HENT: Negative for congestion, postnasal drip and sore throat.   Eyes: Negative.   Respiratory: Positive for shortness of breath. Negative for cough and chest tightness.   Cardiovascular: Negative for chest pain, palpitations and leg swelling.  Gastrointestinal: Positive for abdominal distention. Negative for abdominal pain.  Endocrine: Negative.   Genitourinary: Negative.   Musculoskeletal: Positive for back pain. Negative for neck pain.  Skin: Negative.   Allergic/Immunologic: Negative.   Neurological: Negative for dizziness and light-headedness.  Hematological: Negative for adenopathy. Does not bruise/bleed easily.  Psychiatric/Behavioral: Negative for dysphoric mood and sleep disturbance (sleeping on 2 pillows). The patient is nervous/anxious.    Vitals:   12/12/19 0856  BP: (!) 109/92  Pulse: 85  Resp: 20  SpO2: 98%  Weight: 203 lb 6 oz (92.3 kg)  Height: 5\' 9"  (1.753 m)   Wt Readings from Last 3 Encounters:  12/12/19 203 lb 6 oz (92.3  kg)  12/05/19 191 lb 8 oz (86.9 kg)  10/14/19 202 lb (91.6 kg)   Lab Results  Component Value Date   CREATININE 1.12 11/28/2019   CREATININE 1.19 10/11/2019   CREATININE 1.81 (H) 10/10/2019    Physical Exam Vitals and nursing note reviewed.  Constitutional:      Appearance: Calvin Esparza is well-developed.  HENT:     Head: Normocephalic and atraumatic.  Neck:     Vascular: No JVD.  Cardiovascular:     Rate and Rhythm: Normal rate. Rhythm irregular.  Pulmonary:     Effort: Pulmonary effort is normal. No respiratory distress.     Breath sounds: No wheezing or rales.  Abdominal:     General: There is distension.     Palpations: Abdomen is soft.     Tenderness: There is no abdominal tenderness.  Musculoskeletal:     Cervical back:  Normal range of motion and neck supple.     Right lower leg: No tenderness. No edema.     Left lower leg: No tenderness. No edema.  Skin:    General: Skin is warm and dry.  Neurological:     General: No focal deficit present.     Mental Status: Calvin Esparza is alert and oriented to person, place, and time.  Psychiatric:        Mood and Affect: Mood is anxious.        Behavior: Behavior normal.    Assessment & Plan:  1: Chronic heart failure with reduced ejection fraction- - NYHA class II - euvolemic today - weighing daily; reminded to call for an overnight weight gain of >2 pounds or a weekly weight gain of >5 pounds - weight up 6 pounds from last visit here 2 months ago - not adding salt to his food and tries to be aware of his sodium intake - encouraged him to decrease fluid consumption down to 64 ounces/ day unless Calvin Esparza's sweating a lot - will stop his furosemide and begin torsemide 40mg  daily - will check BMP next visit - explained that his potassium supplement is different from losartan and that Calvin Esparza could continue the losartan (with possibly changing to entresto) but that Calvin Esparza needed to pick up his potassium prescription at the pharmacy - saw cardiology  Rockey Situ) 12/05/19 - BNP 11/28/19 was 1033.4  2: Atrial fibrillation/ PE- - saw PCP Ouida Sills) 09/26/19 - taking 150mg  metoprolol BID right now; not a candidate for corlanor due to AF - BMP 11/28/19 reviewed and showed sodium 143, potassium 3.8, creatinine 1.12 and GFR >60  3: Alcohol use- - + chews tobacco - drinks 3-4 beers daily (12 ounces each)   Medication list reviewed.  Return in 2 weeks or sooner for any questions/problems before then.

## 2019-12-12 ENCOUNTER — Other Ambulatory Visit: Payer: Self-pay

## 2019-12-12 ENCOUNTER — Encounter: Payer: Self-pay | Admitting: Family

## 2019-12-12 ENCOUNTER — Ambulatory Visit: Payer: 59 | Attending: Family | Admitting: Family

## 2019-12-12 VITALS — BP 109/92 | HR 85 | Resp 20 | Ht 69.0 in | Wt 203.4 lb

## 2019-12-12 DIAGNOSIS — G8929 Other chronic pain: Secondary | ICD-10-CM | POA: Diagnosis not present

## 2019-12-12 DIAGNOSIS — Z7289 Other problems related to lifestyle: Secondary | ICD-10-CM | POA: Insufficient documentation

## 2019-12-12 DIAGNOSIS — Z720 Tobacco use: Secondary | ICD-10-CM | POA: Diagnosis not present

## 2019-12-12 DIAGNOSIS — Z86711 Personal history of pulmonary embolism: Secondary | ICD-10-CM | POA: Diagnosis not present

## 2019-12-12 DIAGNOSIS — I4819 Other persistent atrial fibrillation: Secondary | ICD-10-CM | POA: Insufficient documentation

## 2019-12-12 DIAGNOSIS — I428 Other cardiomyopathies: Secondary | ICD-10-CM | POA: Insufficient documentation

## 2019-12-12 DIAGNOSIS — R140 Abdominal distension (gaseous): Secondary | ICD-10-CM | POA: Diagnosis not present

## 2019-12-12 DIAGNOSIS — Z8616 Personal history of COVID-19: Secondary | ICD-10-CM | POA: Insufficient documentation

## 2019-12-12 DIAGNOSIS — I5020 Unspecified systolic (congestive) heart failure: Secondary | ICD-10-CM

## 2019-12-12 DIAGNOSIS — I4891 Unspecified atrial fibrillation: Secondary | ICD-10-CM

## 2019-12-12 DIAGNOSIS — Z7901 Long term (current) use of anticoagulants: Secondary | ICD-10-CM | POA: Diagnosis not present

## 2019-12-12 DIAGNOSIS — Z79899 Other long term (current) drug therapy: Secondary | ICD-10-CM | POA: Diagnosis not present

## 2019-12-12 DIAGNOSIS — F1010 Alcohol abuse, uncomplicated: Secondary | ICD-10-CM

## 2019-12-12 DIAGNOSIS — R0602 Shortness of breath: Secondary | ICD-10-CM | POA: Diagnosis present

## 2019-12-12 DIAGNOSIS — M549 Dorsalgia, unspecified: Secondary | ICD-10-CM | POA: Diagnosis not present

## 2019-12-12 DIAGNOSIS — I5022 Chronic systolic (congestive) heart failure: Secondary | ICD-10-CM | POA: Insufficient documentation

## 2019-12-12 MED ORDER — TORSEMIDE 20 MG PO TABS: 40.0000 mg | ORAL_TABLET | Freq: Every day | ORAL | 5 refills | Status: DC

## 2019-12-12 NOTE — Patient Instructions (Addendum)
Continue weighing daily and call for an overnight weight gain of > 2 pounds or a weekly weight gain of >5 pounds.   Pick up potassium tablets from the pharmacy and take 1 tablet daily   Stop taking furosemide (lasix) and begin torsemide 2 tablets every morning.

## 2019-12-13 ENCOUNTER — Other Ambulatory Visit: Payer: Self-pay | Admitting: Physician Assistant

## 2019-12-23 NOTE — Progress Notes (Deleted)
Patient ID: Calvin Esparza, male    DOB: October 18, 1960, 59 y.o.   MRN: [id number]  HPI  Calvin Esparza is a 59 y/o male with a history of atrial fibrillation, PE, alcohol use, tobacco use (chewing tobacco) and chronic heart failure.   Echo report from 06/08/19 reviewed and showed an EF of 35-40% along with mild LVH and mild LAE.   Was in the ED 11/28/19 due to shortness of breath. Has been not taking diltiazem on days when he takes colchicine. Given dose of IV lasix and diltiazem with good urine output so he was released. Was in the ED 10/10/19 due to weakness, palpitations and near syncope while at work. EKG showed AF with RVR along with AKI. IVF given and cardiology consulted. Initially placed on cardizem drip and then transitioned to oral metoprolol. Released the following day. Admitted 09/08/19 due to shortness of breath and leg pain. Cardiology consult obtained. Had not been taking his xarelto. Initially placed on cardizem and heparin drips and then transitioned to oral medications. Previous cardioversion ~ 1 month ago. Chest CT showed continued PE and leg ultrasound showed DVT throughout entire left thigh. Placed back on xarelto. Discharged after 2 days.    He presents today for a follow-up visit with a chief complaint of  Past Medical History:  Diagnosis Date  . A-fib ([place])   . Alcohol abuse   . CHF (congestive heart failure) ([place])   . Chronic pain   . COVID-19    a. 02/2019  . HFrEF (heart failure with reduced ejection fraction) ([place])    a. 05/2019 Echo: EF 35-40%.  Marland Kitchen NICM (nonischemic cardiomyopathy) ([place])    a. 05/2019 Echo: EF 35-40%, gr1 DD. Mod enlarged RV. Mildly dil LA. Mild to mod dil RA. Triv AI; b. 05/2019 MV: EF 32%, no ischemia.  . Persistent atrial fibrillation ([place])    a. Dx 05/2019 in setting of PE. CHA2DS2VASc = 1-->Eliquis later changed to xarelto; b. 07/2019 s/p DCCV (150J).  . Pulmonary embolism ([place])    a. 05/2019 CTA Chest: mild amt of PE w/in a lower lobe branch of RPA; b.  08/2019 CTA Chest: Small PE in RML and RLL PA.   Past Surgical History:  Procedure Laterality Date  . CARDIOVERSION N/A 07/29/2019   Procedure: CARDIOVERSION;  Surgeon: Minna Merritts, MD;  Location: [facility] ORS;  Service: Cardiovascular;  Laterality: N/A;   Family History  Problem Relation Age of Onset  . Bone cancer Father   . Stroke Father   . Diabetes Sister    Social History   Tobacco Use  . Smoking status: Never Smoker  . Smokeless tobacco: Current User    Types: Chew  Substance Use Topics  . Alcohol use: Yes    Alcohol/week: 24.0 standard drinks    Types: 24 Cans of beer per week    Comment: 6 beers daily, weekend drinks more-   No Known Allergies    Review of Systems  Constitutional: Negative for appetite change and fatigue.  HENT: Negative for congestion, postnasal drip and sore throat.   Eyes: Negative.   Respiratory: Positive for shortness of breath. Negative for cough and chest tightness.   Cardiovascular: Negative for chest pain, palpitations and leg swelling.  Gastrointestinal: Positive for abdominal distention. Negative for abdominal pain.  Endocrine: Negative.   Genitourinary: Negative.   Musculoskeletal: Positive for back pain. Negative for neck pain.  Skin: Negative.   Allergic/Immunologic: Negative.   Neurological: Negative for dizziness and light-headedness.  Hematological:  Negative for adenopathy. Does not bruise/bleed easily.  Psychiatric/Behavioral: Negative for dysphoric mood and sleep disturbance (sleeping on 2 pillows). The patient is nervous/anxious.      Physical Exam Vitals and nursing note reviewed.  Constitutional:      Appearance: He is well-developed.  HENT:     Head: Normocephalic and atraumatic.  Neck:     Vascular: No JVD.  Cardiovascular:     Rate and Rhythm: Normal rate. Rhythm irregular.  Pulmonary:     Effort: Pulmonary effort is normal. No respiratory distress.     Breath sounds: No wheezing or rales.  Abdominal:      General: There is distension.     Palpations: Abdomen is soft.     Tenderness: There is no abdominal tenderness.  Musculoskeletal:     Cervical back: Normal range of motion and neck supple.     Right lower leg: No tenderness. No edema.     Left lower leg: No tenderness. No edema.  Skin:    General: Skin is warm and dry.  Neurological:     General: No focal deficit present.     Mental Status: He is alert and oriented to person, place, and time.  Psychiatric:        Mood and Affect: Mood is anxious.        Behavior: Behavior normal.    Assessment & Plan:  1: Chronic heart failure with reduced ejection fraction- - NYHA class II - euvolemic today - weighing daily; reminded to call for an overnight weight gain of >2 pounds or a weekly weight gain of >5 pounds - weight 203.6 pounds from last visit here 2 weeks ago - not adding salt to his food and tries to be aware of his sodium intake - encouraged him to decrease fluid consumption down to 64 ounces/ day unless he's sweating a lot - torsemide 40mg  daily started at last visit - will check BMP today - saw cardiology Rockey Situ) 12/05/19 - BNP 11/28/19 was 1033.4  2: Atrial fibrillation/ PE- - saw PCP Ouida Sills) 09/26/19 - taking 150mg  metoprolol BID right now; not a candidate for corlanor due to AF - BMP 11/28/19 reviewed and showed sodium 143, potassium 3.8, creatinine 1.12 and GFR >60  3: Alcohol use- - + chews tobacco - drinks 3-4 beers daily (12 ounces each)   Medication list reviewed.

## 2019-12-26 ENCOUNTER — Ambulatory Visit: Payer: 59 | Admitting: Family

## 2020-01-13 ENCOUNTER — Other Ambulatory Visit: Payer: Self-pay | Admitting: Physician Assistant

## 2020-02-25 ENCOUNTER — Other Ambulatory Visit: Payer: Self-pay

## 2020-02-25 ENCOUNTER — Inpatient Hospital Stay
Admission: EM | Admit: 2020-02-25 | Discharge: 2020-02-27 | DRG: 308 | Disposition: A | Discharge status: Home / Self Care | Payer: 59 | Attending: Obstetrics and Gynecology | Admitting: Obstetrics and Gynecology

## 2020-02-25 ENCOUNTER — Inpatient Hospital Stay: Payer: 59

## 2020-02-25 ENCOUNTER — Emergency Department: Payer: 59

## 2020-02-25 ENCOUNTER — Encounter: Payer: Self-pay | Admitting: Emergency Medicine

## 2020-02-25 DIAGNOSIS — R1319 Other dysphagia: Secondary | ICD-10-CM | POA: Diagnosis not present

## 2020-02-25 DIAGNOSIS — N179 Acute kidney failure, unspecified: Secondary | ICD-10-CM | POA: Diagnosis not present

## 2020-02-25 DIAGNOSIS — I4811 Longstanding persistent atrial fibrillation: Secondary | ICD-10-CM | POA: Diagnosis present

## 2020-02-25 DIAGNOSIS — Z9114 Patient's other noncompliance with medication regimen: Secondary | ICD-10-CM | POA: Diagnosis not present

## 2020-02-25 DIAGNOSIS — Z9119 Patient's noncompliance with other medical treatment and regimen: Secondary | ICD-10-CM | POA: Diagnosis not present

## 2020-02-25 DIAGNOSIS — R002 Palpitations: Secondary | ICD-10-CM | POA: Diagnosis present

## 2020-02-25 DIAGNOSIS — G8929 Other chronic pain: Secondary | ICD-10-CM | POA: Diagnosis present

## 2020-02-25 DIAGNOSIS — Z79899 Other long term (current) drug therapy: Secondary | ICD-10-CM

## 2020-02-25 DIAGNOSIS — K259 Gastric ulcer, unspecified as acute or chronic, without hemorrhage or perforation: Secondary | ICD-10-CM | POA: Diagnosis present

## 2020-02-25 DIAGNOSIS — K3189 Other diseases of stomach and duodenum: Secondary | ICD-10-CM | POA: Diagnosis present

## 2020-02-25 DIAGNOSIS — Z9111 Patient's noncompliance with dietary regimen: Secondary | ICD-10-CM | POA: Diagnosis not present

## 2020-02-25 DIAGNOSIS — I5043 Acute on chronic combined systolic (congestive) and diastolic (congestive) heart failure: Secondary | ICD-10-CM | POA: Diagnosis present

## 2020-02-25 DIAGNOSIS — F17220 Nicotine dependence, chewing tobacco, uncomplicated: Secondary | ICD-10-CM | POA: Diagnosis present

## 2020-02-25 DIAGNOSIS — R1314 Dysphagia, pharyngoesophageal phase: Secondary | ICD-10-CM | POA: Diagnosis present

## 2020-02-25 DIAGNOSIS — R140 Abdominal distension (gaseous): Secondary | ICD-10-CM

## 2020-02-25 DIAGNOSIS — K7460 Unspecified cirrhosis of liver: Secondary | ICD-10-CM | POA: Diagnosis present

## 2020-02-25 DIAGNOSIS — J9601 Acute respiratory failure with hypoxia: Secondary | ICD-10-CM | POA: Diagnosis present

## 2020-02-25 DIAGNOSIS — I2699 Other pulmonary embolism without acute cor pulmonale: Secondary | ICD-10-CM | POA: Diagnosis present

## 2020-02-25 DIAGNOSIS — I4891 Unspecified atrial fibrillation: Secondary | ICD-10-CM

## 2020-02-25 DIAGNOSIS — Z20822 Contact with and (suspected) exposure to covid-19: Secondary | ICD-10-CM | POA: Diagnosis present

## 2020-02-25 DIAGNOSIS — I428 Other cardiomyopathies: Secondary | ICD-10-CM | POA: Diagnosis not present

## 2020-02-25 DIAGNOSIS — R6881 Early satiety: Secondary | ICD-10-CM | POA: Diagnosis present

## 2020-02-25 DIAGNOSIS — I5022 Chronic systolic (congestive) heart failure: Secondary | ICD-10-CM | POA: Diagnosis not present

## 2020-02-25 DIAGNOSIS — R1310 Dysphagia, unspecified: Secondary | ICD-10-CM | POA: Diagnosis not present

## 2020-02-25 DIAGNOSIS — Z23 Encounter for immunization: Secondary | ICD-10-CM | POA: Diagnosis not present

## 2020-02-25 DIAGNOSIS — Z86711 Personal history of pulmonary embolism: Secondary | ICD-10-CM

## 2020-02-25 DIAGNOSIS — I110 Hypertensive heart disease with heart failure: Secondary | ICD-10-CM | POA: Diagnosis present

## 2020-02-25 DIAGNOSIS — Z8616 Personal history of COVID-19: Secondary | ICD-10-CM | POA: Diagnosis not present

## 2020-02-25 DIAGNOSIS — Z7901 Long term (current) use of anticoagulants: Secondary | ICD-10-CM | POA: Diagnosis not present

## 2020-02-25 DIAGNOSIS — F1010 Alcohol abuse, uncomplicated: Secondary | ICD-10-CM

## 2020-02-25 HISTORY — DX: Patient's other noncompliance with medication regimen for other reason: Z91.148

## 2020-02-25 HISTORY — DX: Patient's other noncompliance with medication regimen: Z91.14

## 2020-02-25 HISTORY — DX: Longstanding persistent atrial fibrillation: I48.11

## 2020-02-25 LAB — COMPREHENSIVE METABOLIC PANEL
ALT: 37 U/L (ref 0–44)
AST: 45 U/L — ABNORMAL HIGH (ref 15–41)
Albumin: 4.4 g/dL (ref 3.5–5.0)
Alkaline Phosphatase: 67 U/L (ref 38–126)
Anion gap: 15 (ref 5–15)
BUN: 16 mg/dL (ref 6–20)
CO2: 22 mmol/L (ref 22–32)
Calcium: 9.5 mg/dL (ref 8.9–10.3)
Chloride: 101 mmol/L (ref 98–111)
Creatinine, Ser: 1.24 mg/dL (ref 0.61–1.24)
GFR, Estimated: >60 mL/min (ref >60)
Glucose, Bld: 110 mg/dL — ABNORMAL HIGH (ref 70–99)
Potassium: 4.3 mmol/L (ref 3.5–5.1)
Sodium: 138 mmol/L (ref 135–145)
Total Bilirubin: 1.4 mg/dL — ABNORMAL HIGH (ref 0.3–1.2)
Total Protein: 7.6 g/dL (ref 6.5–8.1)

## 2020-02-25 LAB — CBC WITH DIFFERENTIAL/PLATELET
Abs Immature Granulocytes: 0.02 10*3/uL (ref 0.00–0.07)
Basophils Absolute: 0 10*3/uL (ref 0.0–0.1)
Basophils Relative: 1 %
Eosinophils Absolute: 0 10*3/uL (ref 0.0–0.5)
Eosinophils Relative: 0 %
HCT: 43.7 % (ref 39.0–52.0)
Hemoglobin: 14.4 g/dL (ref 13.0–17.0)
Immature Granulocytes: 0 %
Lymphocytes Relative: 31 %
Lymphs Abs: 1.9 10*3/uL (ref 0.7–4.0)
MCH: 32.5 pg (ref 26.0–34.0)
MCHC: 33 g/dL (ref 30.0–36.0)
MCV: 98.6 fL (ref 80.0–100.0)
Monocytes Absolute: 0.6 10*3/uL (ref 0.1–1.0)
Monocytes Relative: 9 %
Neutro Abs: 3.6 10*3/uL (ref 1.7–7.7)
Neutrophils Relative %: 59 %
Platelets: 182 10*3/uL (ref 150–400)
RBC: 4.43 MIL/uL (ref 4.22–5.81)
RDW: 15.3 % (ref 11.5–15.5)
WBC: 6.1 10*3/uL (ref 4.0–10.5)
nRBC: 0 % (ref 0.0–0.2)

## 2020-02-25 LAB — RESP PANEL BY RT-PCR (FLU A&B, COVID) ARPGX2
Influenza A by PCR: NEGATIVE
Influenza B by PCR: NEGATIVE
SARS Coronavirus 2 by RT PCR: NEGATIVE

## 2020-02-25 LAB — TROPONIN I (HIGH SENSITIVITY)
Troponin I (High Sensitivity): 14 ng/L (ref <18)
Troponin I (High Sensitivity): 11 ng/L (ref <18)

## 2020-02-25 LAB — BRAIN NATRIURETIC PEPTIDE: B Natriuretic Peptide: 815.5 pg/mL — ABNORMAL HIGH (ref 0.0–100.0)

## 2020-02-25 LAB — TSH: TSH: 1.449 u[IU]/mL (ref 0.350–4.500)

## 2020-02-25 MED ORDER — PROBENECID 500 MG PO TABS
500.0000 mg | ORAL_TABLET | Freq: Two times a day (BID) | ORAL | Status: DC | PRN
  Filled 2020-02-25: qty 1

## 2020-02-25 MED ORDER — SODIUM CHLORIDE 0.9 % IV SOLN
250.0000 mL | INTRAVENOUS | Status: DC | PRN
Start: 2020-02-25 — End: 2020-02-27

## 2020-02-25 MED ORDER — LORAZEPAM 2 MG/ML IJ SOLN
0.0000 mg | Freq: Four times a day (QID) | INTRAMUSCULAR | Status: AC
  Administered 2020-02-25 (×2): 1 mg via INTRAVENOUS
  Filled 2020-02-25 (×2): qty 1

## 2020-02-25 MED ORDER — LOSARTAN POTASSIUM 25 MG PO TABS
25.0000 mg | ORAL_TABLET | Freq: Every day | ORAL | Status: DC
  Administered 2020-02-26 – 2020-02-27 (×2): 25 mg via ORAL
  Filled 2020-02-25 (×2): qty 1

## 2020-02-25 MED ORDER — SODIUM CHLORIDE 0.9% FLUSH: 3.0000 mL | INTRAVENOUS | Status: DC | PRN

## 2020-02-25 MED ORDER — DILTIAZEM HCL 25 MG/5ML IV SOLN
15.0000 mg | Freq: Once | INTRAVENOUS | Status: AC
  Administered 2020-02-25: 15 mg via INTRAVENOUS
  Filled 2020-02-25: qty 5

## 2020-02-25 MED ORDER — RIVAROXABAN 20 MG PO TABS
20.0000 mg | ORAL_TABLET | Freq: Every day | ORAL | Status: DC
  Filled 2020-02-25: qty 1

## 2020-02-25 MED ORDER — THIAMINE HCL 100 MG PO TABS
100.0000 mg | ORAL_TABLET | Freq: Every day | ORAL | Status: DC
  Administered 2020-02-25 – 2020-02-27 (×3): 100 mg via ORAL
  Filled 2020-02-25 (×4): qty 1

## 2020-02-25 MED ORDER — COLCHICINE-PROBENECID 0.5-500 MG PO TABS: 1.0000 | ORAL_TABLET | Freq: Two times a day (BID) | ORAL | Status: DC | PRN

## 2020-02-25 MED ORDER — LISINOPRIL 5 MG PO TABS: 5.0000 mg | ORAL_TABLET | Freq: Every day | ORAL | Status: DC

## 2020-02-25 MED ORDER — SODIUM CHLORIDE 0.9% FLUSH
3.0000 mL | Freq: Two times a day (BID) | INTRAVENOUS | Status: DC
  Administered 2020-02-25 – 2020-02-27 (×4): 3 mL via INTRAVENOUS

## 2020-02-25 MED ORDER — PNEUMOCOCCAL VAC POLYVALENT 25 MCG/0.5ML IJ INJ
0.5000 mL | INJECTION | INTRAMUSCULAR | Status: AC
  Administered 2020-02-27: 0.5 mL via INTRAMUSCULAR
  Filled 2020-02-25: qty 0.5

## 2020-02-25 MED ORDER — DILTIAZEM HCL ER COATED BEADS 180 MG PO CP24
180.0000 mg | ORAL_CAPSULE | Freq: Every day | ORAL | Status: DC
  Filled 2020-02-25: qty 1

## 2020-02-25 MED ORDER — METOPROLOL SUCCINATE ER 100 MG PO TB24
100.0000 mg | ORAL_TABLET | Freq: Two times a day (BID) | ORAL | Status: DC
  Administered 2020-02-25 – 2020-02-27 (×4): 100 mg via ORAL
  Filled 2020-02-25 (×3): qty 1
  Filled 2020-02-25: qty 2

## 2020-02-25 MED ORDER — ACETAMINOPHEN 325 MG PO TABS: 650.0000 mg | ORAL_TABLET | ORAL | Status: DC | PRN

## 2020-02-25 MED ORDER — ONDANSETRON HCL 4 MG/2ML IJ SOLN: 4.0000 mg | Freq: Four times a day (QID) | INTRAMUSCULAR | Status: DC | PRN

## 2020-02-25 MED ORDER — DILTIAZEM HCL-DEXTROSE 125-5 MG/125ML-% IV SOLN (PREMIX)
5.0000 mg/h | INTRAVENOUS | Status: DC
  Administered 2020-02-25: 5 mg/h via INTRAVENOUS
  Filled 2020-02-25: qty 125

## 2020-02-25 MED ORDER — COLCHICINE 0.6 MG PO TABS
0.6000 mg | ORAL_TABLET | Freq: Two times a day (BID) | ORAL | Status: DC | PRN
  Filled 2020-02-25: qty 1

## 2020-02-25 MED ORDER — INFLUENZA VAC SPLIT QUAD 0.5 ML IM SUSY
0.5000 mL | PREFILLED_SYRINGE | INTRAMUSCULAR | Status: AC
  Administered 2020-02-27: 0.5 mL via INTRAMUSCULAR
  Filled 2020-02-25: qty 0.5

## 2020-02-25 MED ORDER — FUROSEMIDE 10 MG/ML IJ SOLN
40.0000 mg | Freq: Two times a day (BID) | INTRAMUSCULAR | Status: DC
  Administered 2020-02-25 – 2020-02-26 (×3): 40 mg via INTRAVENOUS
  Filled 2020-02-25 (×4): qty 4

## 2020-02-25 MED ORDER — DILTIAZEM HCL ER COATED BEADS 180 MG PO CP24
180.0000 mg | ORAL_CAPSULE | Freq: Every day | ORAL | Status: DC
  Administered 2020-02-25 – 2020-02-27 (×3): 180 mg via ORAL
  Filled 2020-02-25 (×3): qty 1

## 2020-02-25 MED ORDER — POTASSIUM CHLORIDE CRYS ER 20 MEQ PO TBCR
20.0000 meq | EXTENDED_RELEASE_TABLET | Freq: Every day | ORAL | Status: DC
  Administered 2020-02-25 – 2020-02-27 (×3): 20 meq via ORAL
  Filled 2020-02-25 (×3): qty 1

## 2020-02-25 NOTE — H&P (Signed)
History and Physical    Calvin Esparza WER:[id number] DOB: [birth year] DOA: 02/25/2020  PCP: [name], MD   Patient coming from: Home  I have personally briefly reviewed patient's old medical records in [place]  Chief Complaint: Shortness of breath  HPI: Calvin Esparza is a 59 y.o. male with medical history significant for chronic combined systolic and diastolic dysfunction CHF, history of A. fib, history of pulmonary embolism on anticoagulation, hypertension and alcohol abuse who presents to the emergency room for evaluation of worsening shortness of breath and palpitations.  Patient states that he has had shortness of breath since he was diagnosed with Covid almost a year ago but over the last couple of days his symptoms have gotten worse with any form of exertion he has to stop to catch his breath.  He also complains of increased abdominal girth and difficulty swallowing with early satiety. He complains of occasional chest tightness, constipation and palpitations but has no lower extremity swelling, no cough, no fever, no chills, no urinary symptoms, no dizziness or lightheadedness. Labs show sodium 138, potassium 4.3, chloride 101, bicarb 22, glucose 110, BUN 16, creatinine 1.24, calcium 9.5, alkaline phosphatase 67, albumin 4.4, AST 45, ALT 37, total protein 7.6, BNP 815, troponin XI, white count 6.1, hemoglobin 14.4, hematocrit 43.7, MCV 98.6, RDW 15.3, platelet count 182 Respiratory viral panel is negative Chest x-ray reviewed by me shows stable cardiomegaly. Twelve-lead EKG reviewed by me shows A. fib with a rapid ventricular rate   ED Course: Patient is a 59 year old male with a history of chronic combined systolic and diastolic dysfunction CHF, history of A. fib, history of pulmonary embolism on chronic anticoagulation therapy who presents to the ER for evaluation of worsening shortness of breath associated with palpitations.  He also complains of difficulty  swallowing with early satiety.  He was noted to be in rapid A. fib upon arrival to the ER and was placed on a Cardizem drip.  He will be admitted to the hospital for further evaluation.  Review of Systems: As per HPI otherwise 10 point review of systems negative.    Past Medical History:  Diagnosis Date  . A-fib ([place])   . Alcohol abuse   . CHF (congestive heart failure) ([place])   . Chronic pain   . COVID-19    a. 02/2019  . HFrEF (heart failure with reduced ejection fraction) ([place])    a. 05/2019 Echo: EF 35-40%.  Marland Kitchen NICM (nonischemic cardiomyopathy) ([place])    a. 05/2019 Echo: EF 35-40%, gr1 DD. Mod enlarged RV. Mildly dil LA. Mild to mod dil RA. Triv AI; b. 05/2019 MV: EF 32%, no ischemia.  . Persistent atrial fibrillation ([place])    a. Dx 05/2019 in setting of PE. CHA2DS2VASc = 1-->Eliquis later changed to xarelto; b. 07/2019 s/p DCCV (150J).  . Pulmonary embolism ([place])    a. 05/2019 CTA Chest: mild amt of PE w/in a lower lobe branch of RPA; b. 08/2019 CTA Chest: Small PE in RML and RLL PA.    Past Surgical History:  Procedure Laterality Date  . CARDIOVERSION N/A 07/29/2019   Procedure: CARDIOVERSION;  Surgeon: Minna Merritts, MD;  Location: [facility] ORS;  Service: Cardiovascular;  Laterality: N/A;     reports that he has never smoked. His smokeless tobacco use includes chew. He reports current alcohol use of about 24.0 standard drinks of alcohol per week. He reports previous drug use.  No Known Allergies  Family History  Problem Relation Age  of Onset  . Bone cancer Father   . Stroke Father   . Diabetes Sister      Prior to Admission medications   Medication Sig Start Date End Date Taking? Authorizing Provider  colchicine-probenecid 0.5-500 MG tablet Take 1 tablet by mouth 2 (two) times daily as needed (joint pain). 09/26/19  Yes [provider]  diltiazem (CARDIZEM CD) 180 MG 24 hr capsule Take 180 mg by mouth daily.   Yes [provider]  metoprolol succinate  (TOPROL-XL) 100 MG 24 hr tablet Take 1 tablet (100 mg total) by mouth in the morning and at bedtime. Take with or immediately following a meal. 10/11/19  Yes Mariel Aloe, MD  metoprolol succinate (TOPROL-XL) 50 MG 24 hr tablet Take 1 tablet (50 mg total) by mouth in the morning and at bedtime. Take with or immediately following a meal. Patient taking differently: Take 50 mg by mouth in the morning and at bedtime. 10/04/19  Yes Gollan, Kathlene November, MD  potassium chloride SA (KLOR-CON) 20 MEQ tablet Take 1 tablet (20 mEq total) by mouth daily. 12/05/19  Yes Minna Merritts, MD  torsemide (DEMADEX) 20 MG tablet Take 40 mg by mouth daily.   Yes [provider]  XARELTO 20 MG TABS tablet Take 1 tablet (20 mg total) by mouth daily with supper. 12/05/19  Yes Minna Merritts, MD    Physical Exam: Vitals:   02/25/20 0800 02/25/20 0830 02/25/20 0948 02/25/20 0949  BP: (!) 141/101 (!) 149/115 (!) 149/115   Pulse: (!) 104 (!) 126 (!) 126 (!) 108  Resp: 17 14 14 18   Temp:      TempSrc:      SpO2: 96% 94% 97% 97%  Weight:      Height:         Vitals:   02/25/20 0800 02/25/20 0830 02/25/20 0948 02/25/20 0949  BP: (!) 141/101 (!) 149/115 (!) 149/115   Pulse: (!) 104 (!) 126 (!) 126 (!) 108  Resp: 17 14 14 18   Temp:      TempSrc:      SpO2: 96% 94% 97% 97%  Weight:      Height:        Constitutional: NAD, alert and oriented x 3 Eyes: PERRL, lids and conjunctivae normal ENMT: Mucous membranes are moist.  Neck: normal, supple, no masses, no thyromegaly Respiratory: Faint crackles at the bases, no wheezing, no crackles. Normal respiratory effort. No accessory muscle use.  Cardiovascular: Irregularly irregular, no murmurs / rubs / gallops. No extremity edema. 2+ pedal pulses. No carotid bruits.  Abdomen: no tenderness, no masses palpated. No hepatosplenomegaly. Bowel sounds positive.  Increased abdominal girth, central adiposity Musculoskeletal: no clubbing / cyanosis. No joint  deformity upper and lower extremities.  Skin: no rashes, lesions, ulcers.  Neurologic: No gross focal neurologic deficit. Psychiatric: Normal mood and affect.   Labs on Admission: I have personally reviewed following labs and imaging studies  CBC: Recent Labs  Lab 02/25/20 0535  WBC 6.1  NEUTROABS 3.6  HGB 14.4  HCT 43.7  MCV 98.6  PLT 233   Basic Metabolic Panel: Recent Labs  Lab 02/25/20 0535  NA 138  K 4.3  CL 101  CO2 22  GLUCOSE 110*  BUN 16  CREATININE 1.24  CALCIUM 9.5   GFR: Estimated Creatinine Clearance: 73.6 mL/min (by C-G formula based on SCr of 1.24 mg/dL). Liver Function Tests: Recent Labs  Lab 02/25/20 0535  AST 45*  ALT 37  ALKPHOS 67  BILITOT 1.4*  PROT 7.6  ALBUMIN 4.4   No results for input(s): LIPASE, AMYLASE in the last 168 hours. No results for input(s): AMMONIA in the last 168 hours. Coagulation Profile: No results for input(s): INR, PROTIME in the last 168 hours. Cardiac Enzymes: No results for input(s): CKTOTAL, CKMB, CKMBINDEX, TROPONINI in the last 168 hours. BNP (last 3 results) No results for input(s): PROBNP in the last 8760 hours. HbA1C: No results for input(s): HGBA1C in the last 72 hours. CBG: No results for input(s): GLUCAP in the last 168 hours. Lipid Profile: No results for input(s): CHOL, HDL, LDLCALC, TRIG, CHOLHDL, LDLDIRECT in the last 72 hours. Thyroid Function Tests: No results for input(s): TSH, T4TOTAL, FREET4, T3FREE, THYROIDAB in the last 72 hours. Anemia Panel: No results for input(s): VITAMINB12, FOLATE, FERRITIN, TIBC, IRON, RETICCTPCT in the last 72 hours. Urine analysis: No results found for: COLORURINE, APPEARANCEUR, LABSPEC, [place], GLUCOSEU, [place], BILIRUBINUR, KETONESUR, PROTEINUR, UROBILINOGEN, NITRITE, LEUKOCYTESUR  Radiological Exams on Admission: DG Chest Port 1 View  Result Date: 02/25/2020 CLINICAL DATA:  Shortness of breath EXAM: PORTABLE CHEST 1 VIEW COMPARISON:  11/28/2019 and prior  FINDINGS: Mild hypoinflation. No focal consolidation, pneumothorax or pleural effusion. Stable cardiomegaly. Multilevel spondylosis and chronic right rib deformities. IMPRESSION: No focal consolidation. Stable cardiomegaly. Electronically Signed   By: Primitivo Gauze M.D.   On: 02/25/2020 06:01    EKG: Independently reviewed.  Atrial fibrillation with a rapid ventricular rate  Assessment/Plan Principal Problem:   Rapid atrial fibrillation (HCC) Active Problems:   Alcohol abuse   Pulmonary embolism (HCC)   NICM (nonischemic cardiomyopathy) (HCC)   Acute on chronic combined systolic (congestive) and diastolic (congestive) heart failure (HCC)     Atrial fibrillation with rapid ventricular rate Patient with a known history of A. fib who presents to the ER for evaluation of worsening shortness of breath and palpitations and was found to be tachycardic with heart rate of 177 bpm He is currently on Cardizem drip with improvement in his rate Resume oral metoprolol and Cardizem and titrate drip off once heart rate is 100 bpm or less Continue Xarelto as primary prophylaxis for an acute stroke   Acute on chronic combined systolic and diastolic dysfunction CHF Patient's last 2D echocardiogram in March showed an LVEF of 35 to 40% Place patient on Lasix 40 mg IV twice daily Maintain low-sodium diet Continue metoprolol and add low-dose ACE inhibitor Patient may benefit from [facility] but will defer to cardiology    History of pulmonary embolism Continue Xarelto    Alcohol abuse Noted to have elevated AST level Patient has been counseled on the need to abstain from further alcohol use Noted to have increased abdominal girth/distention ??  Ascites from liver cirrhosis Will obtain abdominal ultrasound Place patient on lorazepam for alcohol withdrawal symptoms and administer for CIWA score of 8 or greater   DVT prophylaxis: Xarelto Code Status: Full code Family Communication: Greater  than 50% of time was spent discussing patient's condition and plan of care with him at the bedside.  All questions and concerns have been addressed.  He verbalizes understanding and agrees with the plan. Disposition Plan: Back to previous home environment Consults called: Cardiology/GI    Collier Bullock MD [facility]     02/25/2020, 10:44 AM

## 2020-02-25 NOTE — ED Notes (Signed)
RN called pharmacy to request Cardizem drip.

## 2020-02-25 NOTE — ED Notes (Signed)
This RN assumed care for pt. Still waiting for pharmacy to send diltiazem drip so it can be initiated.

## 2020-02-25 NOTE — ED Triage Notes (Signed)
Patient states that he has been short of breath since January 1st. Patient states that over the past week has had difficulty swallowing. Patient also states that when he drinks water half of it comes back up. Patient states that he tried taking nexium last night with no improvement.

## 2020-02-25 NOTE — ED Notes (Signed)
X-ray at bedside

## 2020-02-25 NOTE — Consult Note (Signed)
Calvin Esparza , MD [address] [place], [place], [place], [address] [address], [place], [place], [place], [address] Phone: [phone]  Fax: [phone]  Consultation  Referring Provider:     No ref. provider found Primary Care Physician:  [name], MD Primary Gastroenterologist:  None         Reason for Consult: Dysphagia and early satiety  Date of Admission:  02/25/2020 Date of Consultation:  02/25/2020         HPI:   Calvin Esparza is a 59 y.o. male presented to the emergency room earlier today with shortness of breath and palpitations.  In the ER he mentioned increased abdominal girth, difficulty swallowing and early satiety.  On arrival to the ER was found to be in rapid A. fib and was started on a Cardizem drip.  Underwent an ultrasound of the abdomen which showed diffuse mild gallbladder wall thickening which is nonspecific.  Features of liver cirrhosis and no ascites.  Chest x-ray demonstrated no focal consolidation but stable cardiomegaly.  Troponin normal, BNP elevated at 815.  CMP shows no gross abnormality.  He is on Xarelto.  When I went to see him in the ER he was having some crackers and some juice.  He says that for the past few months has had difficulty swallowing solids and liquids.  Associated with some heartburn.  Feels the food gets stuck in the center of his chest.  Denies any weight loss.  Drinks alcohol daily.  No prior endoscopy. Past Medical History:  Diagnosis Date  . Alcohol abuse   . Chronic pain   . COVID-19    a. 02/2019  . HFrEF (heart failure with reduced ejection fraction) ([place])    a. 05/2019 Echo: EF 35-40%.  . History of medication noncompliance   . Longstanding persistent atrial fibrillation ([place])    a. Dx 03/2018; b. 05/2019 recurrent Afib in setting of PE. CHA2DS2VASc = 1-->Eliquis later changed to xarelto; b. 07/2019 s/p DCCV (150J); d. 08/2019 admit w/ AF RVR in settting of noncompliance - longstanding persistent afib since  on bb/ccb rx.  Marland Kitchen NICM (nonischemic cardiomyopathy) ([place])    a. 05/2019 Echo: EF 35-40%, gr1 DD. Mod enlarged RV. Mildly dil LA. Mild to mod dil RA. Triv AI; b. 05/2019 MV: EF 32%, no ischemia.  . Pulmonary embolism ([place])    a. 05/2019 CTA Chest: mild amt of PE w/in a lower lobe branch of RPA; b. 08/2019 CTA Chest: Small PE in RML and RLL PA.    Past Surgical History:  Procedure Laterality Date  . CARDIOVERSION N/A 07/29/2019   Procedure: CARDIOVERSION;  Surgeon: Minna Merritts, MD;  Location: [facility] ORS;  Service: Cardiovascular;  Laterality: N/A;    Prior to Admission medications   Medication Sig Start Date End Date Taking? Authorizing Provider  colchicine-probenecid 0.5-500 MG tablet Take 1 tablet by mouth 2 (two) times daily as needed (joint pain). 09/26/19  Yes [provider]  diltiazem (CARDIZEM CD) 180 MG 24 hr capsule Take 180 mg by mouth daily.   Yes [provider]  metoprolol succinate (TOPROL-XL) 100 MG 24 hr tablet Take 1 tablet (100 mg total) by mouth in the morning and at bedtime. Take with or immediately following a meal. 10/11/19  Yes Mariel Aloe, MD  metoprolol succinate (TOPROL-XL) 50 MG 24 hr tablet Take 1 tablet (50 mg total) by mouth in the morning and at bedtime. Take with or immediately following a meal. Patient  taking differently: Take 50 mg by mouth in the morning and at bedtime. 10/04/19  Yes Gollan, Kathlene November, MD  potassium chloride SA (KLOR-CON) 20 MEQ tablet Take 1 tablet (20 mEq total) by mouth daily. 12/05/19  Yes Minna Merritts, MD  torsemide (DEMADEX) 20 MG tablet Take 40 mg by mouth daily.   Yes [provider]  XARELTO 20 MG TABS tablet Take 1 tablet (20 mg total) by mouth daily with supper. 12/05/19  Yes Minna Merritts, MD    Family History  Problem Relation Age of Onset  . Bone cancer Father   . Stroke Father   . Diabetes Sister      Social History   Tobacco Use  . Smoking status: Never Smoker  . Smokeless tobacco:  Current User    Types: Chew  Vaping Use  . Vaping Use: Never used  Substance Use Topics  . Alcohol use: Yes    Alcohol/week: 24.0 standard drinks    Types: 24 Cans of beer per week    Comment: 6 beers daily, weekend drinks more-  . Drug use: Not Currently    Allergies as of 02/25/2020  . (No Known Allergies)    Review of Systems:    All systems reviewed and negative except where noted in HPI.   Physical Exam:  Vital signs in last 24 hours: Temp:  [98.3 F (36.8 C)] 98.3 F (36.8 C) (12/11 0526) Pulse Rate:  [93-146] 98 (12/11 1200) Resp:  [14-25] 19 (12/11 1200) BP: (123-149)/(92-119) 147/110 (12/11 1200) SpO2:  [92 %-99 %] 97 % (12/11 1200) Weight:  [88.5 kg] 88.5 kg (12/11 0526)   General:   Pleasant, cooperative in NAD Head:  Normocephalic and atraumatic. Eyes:   No icterus.   Conjunctiva pink. PERRLA. Ears:  Normal auditory acuity. Neck:  Supple; no masses or thyroidomegaly Lungs: Respirations even and unlabored. Lungs clear to auscultation bilaterally.   No wheezes, crackles, or rhonchi.  Heart:  Regular rate and rhythm;  Without murmur, clicks, rubs or gallops Abdomen:  Soft, nondistended, nontender. Normal bowel sounds. No appreciable masses or hepatomegaly.  No rebound or guarding.  Neurologic:  Alert and oriented x3;  grossly normal neurologically. Skin:  Intact without significant lesions or rashes. Cervical Nodes:  No significant cervical adenopathy. Psych:  Alert and cooperative. Normal affect.  LAB RESULTS: Recent Labs    02/25/20 0535  WBC 6.1  HGB 14.4  HCT 43.7  PLT 182   BMET Recent Labs    02/25/20 0535  NA 138  K 4.3  CL 101  CO2 22  GLUCOSE 110*  BUN 16  CREATININE 1.24  CALCIUM 9.5   LFT Recent Labs    02/25/20 0535  PROT 7.6  ALBUMIN 4.4  AST 45*  ALT 37  ALKPHOS 67  BILITOT 1.4*   PT/INR No results for input(s): LABPROT, INR in the last 72 hours.  STUDIES: DG Chest Port 1 View  Result Date: 02/25/2020 CLINICAL  DATA:  Shortness of breath EXAM: PORTABLE CHEST 1 VIEW COMPARISON:  11/28/2019 and prior FINDINGS: Mild hypoinflation. No focal consolidation, pneumothorax or pleural effusion. Stable cardiomegaly. Multilevel spondylosis and chronic right rib deformities. IMPRESSION: No focal consolidation. Stable cardiomegaly. Electronically Signed   By: Primitivo Gauze M.D.   On: 02/25/2020 06:01   US Abdomen Limited RUQ (LIVER/GB)  Result Date: 02/25/2020 CLINICAL DATA:  Abdominal distension, history of alcohol abuse. EXAM: ULTRASOUND ABDOMEN LIMITED RIGHT UPPER QUADRANT COMPARISON:  CT abdomen pelvis 09/10/2016 FINDINGS: Gallbladder: Decompressed  with diffusely thickened wall measuring up to 0.6 cm. Trace pericholecystic fluid. No gallstones identified. Negative sonographic Murphy sign. Common bile duct: Diameter: 0.5 cm, within normal limits. Liver: Nodular contour with heterogeneous echotexture. No focal lesion identified. Portal vein is patent on color Doppler imaging with normal direction of blood flow towards the liver. Other: No ascites visualized. IMPRESSION: 1. Diffuse mild gallbladder wall thickening which is a nonspecific finding and can be seen in the setting of gallbladder inflammation or secondary to cirrhosis, congestive heart failure, hepatitis, among others. No gallstones identified. 2.  Cirrhotic changes in the liver.  No focal lesion identified. 3.  No ascites. Electronically Signed   By: Audie Pinto M.D.   On: 02/25/2020 11:39      Impression / Plan:   VARICK KEYS is a 59 y.o. y/o male presented to the emergency room with rapid atrial fibrillation and associated shortness of breath.  Commenced on a Cardizem drip.  He is on Xarelto.  I been asked to evaluate for dysphagia and early satiety.  Ultrasound of the abdomen demonstrated cirrhosis possibly due to alcohol use.  His serum albumin is 4.4 and he has a normal platelet count.  Plan 1.  In terms of his dysphagia and early satiety,  evaluation with endoscopy would be required at some point.  With admission for shortness of breath, atrial fibrillation would avoid any evaluation till seen, evaluated by cardiology and off Xarelto for at least 2 to 3 days.  If he cannot go off Xarelto at this point of time for 2 to 3 days or if not deemed safe to perform anesthesia then could obtain a barium swallow to rule out any stricture.  It is possible that it is due to acid reflux that he may have esophagitis or stricture  2.  In terms of his incidental finding of liver cirrhosis, this will require further evaluation as an outpatient.  With a normal albumin and platelet count he has very likely good liver function.  I would suggest to obtain an INR and further evaluation can be deferred to outpatient.   Thank you for involving me in the care of this patient.      LOS: 0 days   Calvin Bellows, MD  02/25/2020, 12:20 PM

## 2020-02-25 NOTE — ED Notes (Signed)
Pt updated on plan to admit and pt reports agreement with plan.

## 2020-02-25 NOTE — Consult Note (Addendum)
Cardiology Consult    Patient ID: Calvin Esparza MRN: [id number], DOB/AGE: 03-25-1960   Admit date: 02/25/2020 Date of Consult: 02/25/2020  Primary Physician: [name], MD Primary Cardiologist: Ida Rogue, MD Requesting Provider: Milon Dikes, MD  Patient Profile    [name] is a 59 y.o. male with a history of longstanding persistent atrial fibrillation status post prior cardioversion in May 2021, alcohol abuse, chronic pain, nonischemic cardiomyopathy/HFrEF (EF 35-40% 05/2019), PE (05/2019), and COVID-19 infection (02/2019) who is being seen today for the evaluation of afib RVR at the request of Dr. Francine Graven.  Past Medical History   Past Medical History:  Diagnosis Date  . Alcohol abuse   . Chronic pain   . COVID-19    a. 02/2019  . HFrEF (heart failure with reduced ejection fraction) ([place])    a. 05/2019 Echo: EF 35-40%.  . History of medication noncompliance   . Longstanding persistent atrial fibrillation ([place])    a. Dx 03/2018; b. 05/2019 recurrent Afib in setting of PE. CHA2DS2VASc = 1-->Eliquis later changed to xarelto; b. 07/2019 s/p DCCV (150J); d. 08/2019 admit w/ AF RVR in settting of noncompliance - longstanding persistent afib since on bb/ccb rx.  Marland Kitchen NICM (nonischemic cardiomyopathy) ([place])    a. 05/2019 Echo: EF 35-40%, gr1 DD. Mod enlarged RV. Mildly dil LA. Mild to mod dil RA. Triv AI; b. 05/2019 MV: EF 32%, no ischemia.  . Pulmonary embolism ([place])    a. 05/2019 CTA Chest: mild amt of PE w/in a lower lobe branch of RPA; b. 08/2019 CTA Chest: Small PE in RML and RLL PA.    Past Surgical History:  Procedure Laterality Date  . CARDIOVERSION N/A 07/29/2019   Procedure: CARDIOVERSION;  Surgeon: Minna Merritts, MD;  Location: [facility] ORS;  Service: Cardiovascular;  Laterality: N/A;     Allergies  No Known Allergies  History of Present Illness  59 year old male with above past medical history including persistent atrial fibrillation status post  cardioversion in May 2021, alcohol abuse, chronic pain, nonischemic cardiomyopathy, HFrEF, pulmonary embolism (March 2021), and COVID-19 infection.  He was previously diagnosed with atrial fibrillation in January 2020, when he presented with chest pain and dizziness and was found to be in atrial fibrillation.  He was placed on beta-blocker and Xarelto therapy and subsequently followed up with Dr. Rockey Situ in July 2020.  He has been intermittently compliant since then.  In March 2021, he presented to the emergency department with chest tightness and dyspnea and was found to be in atrial fibrillation with RVR.  CT angiogram of the chest showed mild amount of pulmonary embolism within a lower branch of the right pulmonary artery.  Echo showed an EF of 35-40% with grade 1 diastolic dysfunction moderately enlarged right heart chambers.  Stress testing was undertaken and was nonischemic.  He was discharged home on Eliquis with an initial plan for rate controlled A. Fib and clinic follow-up.  In the outpatient setting, he was switched from Eliquis to Xarelto to improve compliance and subsequently underwent cardioversion on Jul 29, 2019.  He was readmitted in June 2021 in the setting of dyspnea and recurrent atrial fibrillation.  CT of the chest showed small pulmonary emboli in the right middle and lower lobe pulmonary arteries.  Recurrent PE was felt to be secondary to noncompliance.  He was subsequently discharged and then readmitted in July with recurrent rapid A. fib and presyncope in the setting of dehydration and acute kidney injury.  Focus has  been on rate-control since then and he has been Rx dilt 180 daily and toprol xl 150 bid.  From a heart failure standpoint, he has been managed w/ oral torsemide.  He continues to drink 5-6 beers or alcoholic beverages daily.  He notes that he has chronic DOE, likely dating back to his [place] dx in 02/2019.  His weight typically runs between 185 and 195 lbs.  At the higher end  of that range, he will note increasing abd girth, worsening dyspnea, orthopnea, bendopnea, and sometimes lower ext edema.  Over the past month, his wts have been trending in the 190's.  Over the same period of time, he has had dysphagia, noting that even after small swallows of water, he feels chest/epigastric fullness and freq has to spit it up.  In that setting, he has stopped taking his metoprolol and diltiazem over the past 2 wks.  He says that he never misses his torsemide or Xarelto however.  Unfortunately, he has noted increasing dyspnea on exertion and orthopnea as well as abdominal bloating.  When he returned home from work last night, he was fairly uncomfortable and restless throughout the night, prompting presentation to the ED today.  On arrival here, he was found in A. fib with RVR at 177 bpm and nonspecific ST and T changes.  Chest x-ray unremarkable.  Abdominal ultrasound with diffuse mild gallbladder wall thickening and cirrhotic changes in the liver.  Lab work notable for elevated BNP of 815.  Troponins are normal x2.  He was placed on IV diltiazem with improved rates into the 90s.  He is currently resting quietly and in no acute distress.  He just finished eating [employer].  Inpatient Medications    . diltiazem  180 mg Oral Daily  . furosemide  40 mg Intravenous BID  . lisinopril  5 mg Oral Daily  . LORazepam  0-4 mg Intravenous Q6H  . metoprolol succinate  100 mg Oral BID  . potassium chloride SA  20 mEq Oral Daily  . rivaroxaban  20 mg Oral Q supper  . sodium chloride flush  3 mL Intravenous Q12H  . thiamine  100 mg Oral Daily    Family History    Family History  Problem Relation Age of Onset  . Bone cancer Father   . Stroke Father   . Diabetes Sister    He indicated that his mother is alive. He indicated that his father is deceased. He indicated that both of his sisters are alive.   Social History    Social History   Socioeconomic History  . Marital status: Married     Spouse name: Not on file  . Number of children: Not on file  . Years of education: Not on file  . Highest education level: Not on file  Occupational History  . Not on file  Tobacco Use  . Smoking status: Never Smoker  . Smokeless tobacco: Current User    Types: Chew  Vaping Use  . Vaping Use: Never used  Substance and Sexual Activity  . Alcohol use: Yes    Alcohol/week: 24.0 standard drinks    Types: 24 Cans of beer per week    Comment: 6 beers daily, weekend drinks more-  . Drug use: Not Currently  . Sexual activity: Not on file  Other Topics Concern  . Not on file  Social History Narrative   Lives locally w/ wife.  Does not routinely exercise.  Works full time in Development worker, international aid.  Social Determinants of Health   Financial Resource Strain: Not on file  Food Insecurity: Not on file  Transportation Needs: Not on file  Physical Activity: Not on file  Stress: Not on file  Social Connections: Not on file  Intimate Partner Violence: Not on file     Review of Systems    General:  No chills, fever, night sweats or weight changes.  Cardiovascular:  No chest pain, +++ dyspnea on exertion, +++ occas LE edema, +++ inc abd girth, +++ orthopnea, no palpitations, paroxysmal nocturnal dyspnea. Dermatological: No rash, lesions/masses Respiratory: +++ occas cough, +++ dyspnea Urologic: No hematuria, dysuria Abdominal:   +++ inc abd girth and bloating.  No nausea, vomiting, diarrhea, bright red blood per rectum, melena, or hematemesis Neurologic:  No visual changes, wkns, changes in mental status. All other systems reviewed and are otherwise negative except as noted above.  Physical Exam    Blood pressure (!) 147/110, pulse 98, temperature 98.3 F (36.8 C), temperature source Oral, resp. rate 19, height 5\' 10"  (1.778 m), weight 88.5 kg, SpO2 97 %.  General: Pleasant, NAD Psych: Normal affect. Neuro: Alert and oriented X 3. Moves all extremities spontaneously. HEENT:  Normal  Neck: Supple without bruits or JVD. Lungs:  Resp regular and unlabored, diminished breath sounds bilaterally.  More so in the bases. Heart: Irregularly irregular, no s3, s4, or murmurs. Abdomen: Soft, protuberant but non-tender, non-distended, BS + x 4.  No edema/ascites. Extremities: No clubbing, cyanosis or edema. DP/PT2+, Radials 2+ and equal bilaterally.  Labs    Cardiac Enzymes Recent Labs  Lab 02/25/20 0535 02/25/20 0729  TROPONINIHS 14 11      Lab Results  Component Value Date   WBC 6.1 02/25/2020   HGB 14.4 02/25/2020   HCT 43.7 02/25/2020   MCV 98.6 02/25/2020   PLT 182 02/25/2020    Recent Labs  Lab 02/25/20 0535  NA 138  K 4.3  CL 101  CO2 22  BUN 16  CREATININE 1.24  CALCIUM 9.5  PROT 7.6  BILITOT 1.4*  ALKPHOS 67  ALT 37  AST 45*  GLUCOSE 110*    Radiology Studies    DG Chest Port 1 View  Result Date: 02/25/2020 CLINICAL DATA:  Shortness of breath EXAM: PORTABLE CHEST 1 VIEW COMPARISON:  11/28/2019 and prior FINDINGS: Mild hypoinflation. No focal consolidation, pneumothorax or pleural effusion. Stable cardiomegaly. Multilevel spondylosis and chronic right rib deformities. IMPRESSION: No focal consolidation. Stable cardiomegaly. Electronically Signed   By: Primitivo Gauze M.D.   On: 02/25/2020 06:01   US Abdomen Limited RUQ (LIVER/GB)  Result Date: 02/25/2020 CLINICAL DATA:  Abdominal distension, history of alcohol abuse. EXAM: ULTRASOUND ABDOMEN LIMITED RIGHT UPPER QUADRANT COMPARISON:  CT abdomen pelvis 09/10/2016 FINDINGS: Gallbladder: Decompressed with diffusely thickened wall measuring up to 0.6 cm. Trace pericholecystic fluid. No gallstones identified. Negative sonographic Murphy sign. Common bile duct: Diameter: 0.5 cm, within normal limits. Liver: Nodular contour with heterogeneous echotexture. No focal lesion identified. Portal vein is patent on color Doppler imaging with normal direction of blood flow towards the liver. Other: No  ascites visualized. IMPRESSION: 1. Diffuse mild gallbladder wall thickening which is a nonspecific finding and can be seen in the setting of gallbladder inflammation or secondary to cirrhosis, congestive heart failure, hepatitis, among others. No gallstones identified. 2.  Cirrhotic changes in the liver.  No focal lesion identified. 3.  No ascites. Electronically Signed   By: Audie Pinto M.D.   On: 02/25/2020 11:39  ECG & Cardiac Imaging    Atrial fibrillation, 177, nonspecific ST and T changes- personally reviewed.  Assessment & Plan    1.  Atrial fibrillation with rapid ventricular response: Over the past month, patient has been having difficulty swallowing and in that setting, he has stopped taking his metoprolol and diltiazem.  He thinks it's probably been about 2 weeks since he has had a dose.  He notes compliance with Xarelto and torsemide however.  In the setting of noncompliance, he has noticed increasing dyspnea on exertion, abdominal girth, orthopnea, and bendopnea.  He had a restless night last night secondary to orthopnea and presented to the emergency room this morning and was found to be in atrial fibrillation at a rate of 177.  Rates have improved significantly on IV diltiazem.  We discussed the importance of medication compliance.  As dysphagia has prompted him to limit his medications, recommend work-up-? Speech therapy/barium swallow/GI eval.  As rates are fairly easily controlled on diltiazem, will resume home doses AV nodal blocking agents.  Continue Xarelto for CHA2DS2VASc of 1-2 (bp's have been elevated).  2.  Dysphagia: Patient with a 1 month history of dysphagia.  This results in frequent spitting up of food and fluids.  He notes that this has impacted his nutrition and has prompted him to stop taking some of his medications including AV nodal blocking agents.  Will ask speech to see.  3.  History of pulmonary emboli/DVT: First diagnosed in March with small right middle  and lower lobe pulmonary emboli on follow-up CT in June in the setting of noncompliance with anticoagulation.  He notes good compliance since then.  4.  Nonischemic cardiomyopathy/heart failure with reduced ejection fraction: In the setting of noncompliance with rate controlling agents, patient has been struggling with increasing abdominal girth, dyspnea, and orthopnea.  He notes his weights at home have been trending on the high end of his usual range of 185 to 195 despite taking torsemide at home. He does not appear to be markedly volume overloaded on examination today and his chest x-ray is without significant CHF however, BNP is elevated at 815.5.  Agree with intravenous Lasix today.  Continue home dose of beta-blocker.  Was previously on losartan at home but it appears that he has stopped taking this.  We will resume.  We do recognize that diltiazem therapy is not ideal in the setting of LV dysfunction however, this and accommodation with oral beta-blocker has been the only thing reasonably successful at rate controlling his A. fib, when he takes it.  I suggest finished eating Janine Limbo, we did have a discussion about the importance of dietary compliance and salt avoidance.  5.  EtOH abuse: Fortunately, he continues to drink 5-6 alcoholic beverages a night.  He understands that this is contributing to his ongoing struggles with atrial fibrillation and subsequently heart failure..  Complete cessation advised.  6.  Medication nonadherence: We discussed the importance of compliance with medications as not taking his AV nodal blocking agents is likely resulted in rapid atrial fibrillation at home with subsequent worsening of volume excess and heart failure symptoms.  Signed, Murray Hodgkins, NP 02/25/2020, 12:18 PM   Patient seen and examined  I agree with findings of C Sharolyn Douglas above  Pt is a 59 yo with atrial fibrillation (Rx of rate control and Xarelto), systolic CHF,  PE, EtOH abuse and difficulty  swallowing who presents with increased SOB (beyond baseline) and epigastric fullnes Found to be in afib with RVR.  Note because of problems swallowing he has not been taking all of his meds  ON exam, patient appears comfortable on ED bed. HR 100-110s (afib)  Initially 170s  On diltiazem IV Neck:  JVP is normal  Lungs are relatively clear Cardiac exam:  Irreg irreg   No S3   No murmurs Abd   Supple  Ext are without edema  Recomm: Afib  Continue IV diltiazem (used because works, even through LVEF dowon)  Add  IV lopressor and follow   Continue Xarelto  Dysphagia.   Needs to be evaluated as limiting Rx success  NICM   Pt has not been compliant with meds   Resume losartan   BBlocker.  Again, dilt is being used for rate control  Will continue to follow   Dorris Carnes MD   For questions or updates, please contact   Please consult [email or web address] for contact info under Cardiology/STEMI.

## 2020-02-25 NOTE — ED Notes (Signed)
Pt assisted to the bathroom with steady gait.  

## 2020-02-25 NOTE — ED Notes (Signed)
Pt ambulated around room and HR increased to 140s. No dizziness or lightheadedness reported. Upon laying back in bed HR dtopped back to 111.

## 2020-02-25 NOTE — ED Provider Notes (Signed)
[facility] Emergency Department Provider Note   ____________________________________________   Event Date/Time   First MD Initiated Contact with Patient 02/25/20 0530     (approximate)  I have reviewed the triage vital signs and the nursing notes.   HISTORY  Chief Complaint Shortness of Breath    HPI Calvin Esparza is a 59 y.o. male who presents to the ED from home with a chief complaint of palpitations and shortness of breath.  Patient has a history of atrial fibrillation on Xarelto, diltiazem and metoprolol which she states he is compliant with.  Also with a history of alcohol abuse; last drink approximately 6 PM.  History of nonischemic cardiomyopathy with EF 35 to 40%.  Symptoms associated with chest tightness.  Denies recent fever, cough, abdominal pain, nausea, vomiting or diarrhea.     Past Medical History:  Diagnosis Date  . A-fib ([place])   . Alcohol abuse   . CHF (congestive heart failure) ([place])   . Chronic pain   . COVID-19    a. 02/2019  . HFrEF (heart failure with reduced ejection fraction) ([place])    a. 05/2019 Echo: EF 35-40%.  Marland Kitchen NICM (nonischemic cardiomyopathy) ([place])    a. 05/2019 Echo: EF 35-40%, gr1 DD. Mod enlarged RV. Mildly dil LA. Mild to mod dil RA. Triv AI; b. 05/2019 MV: EF 32%, no ischemia.  . Persistent atrial fibrillation ([place])    a. Dx 05/2019 in setting of PE. CHA2DS2VASc = 1-->Eliquis later changed to xarelto; b. 07/2019 s/p DCCV (150J).  . Pulmonary embolism ([place])    a. 05/2019 CTA Chest: mild amt of PE w/in a lower lobe branch of RPA; b. 08/2019 CTA Chest: Small PE in RML and RLL PA.    Patient Active Problem List   Diagnosis Date Noted  . Pre-syncope 10/10/2019  . Acute on chronic HFrEF (heart failure with reduced ejection fraction) ([place])   . Persistent atrial fibrillation ([place])   . Dilated cardiomyopathy ([place]) 06/09/2019  . Chronic combined systolic and diastolic CHF (congestive heart failure) ([place]) 06/08/2019  .  Acute pulmonary embolism ([place]) 06/07/2019  . Atrial fibrillation with RVR ([place]) 10/01/2018  . Alcohol abuse 10/01/2018    Past Surgical History:  Procedure Laterality Date  . CARDIOVERSION N/A 07/29/2019   Procedure: CARDIOVERSION;  Surgeon: Minna Merritts, MD;  Location: [facility] ORS;  Service: Cardiovascular;  Laterality: N/A;    Prior to Admission medications   Medication Sig Start Date End Date Taking? Authorizing Provider  colchicine-probenecid 0.5-500 MG tablet Take 1 tablet by mouth 2 (two) times daily. 09/26/19   [provider]  diltiazem (CARDIZEM CD) 180 MG 24 hr capsule Take 1 capsule (180 mg total) by mouth daily. 10/14/19 01/12/20  Minna Merritts, MD  losartan (COZAAR) 25 MG tablet Take 25 mg by mouth daily.    [provider]  metoprolol succinate (TOPROL-XL) 100 MG 24 hr tablet Take 1 tablet (100 mg total) by mouth in the morning and at bedtime. Take with or immediately following a meal. 10/11/19   Mariel Aloe, MD  metoprolol succinate (TOPROL-XL) 50 MG 24 hr tablet Take 1 tablet (50 mg total) by mouth in the morning and at bedtime. Take with or immediately following a meal. Patient taking differently: Take 50 mg by mouth in the morning and at bedtime.  10/04/19   Minna Merritts, MD  potassium chloride SA (KLOR-CON) 20 MEQ tablet Take 1 tablet (20 mEq total) by mouth daily. Patient not taking:  Reported on 12/12/2019 12/05/19   Minna Merritts, MD  torsemide (DEMADEX) 20 MG tablet Take 2 tablets (40 mg total) by mouth daily. 12/12/19 01/11/20  Darylene Price A, FNP  XARELTO 20 MG TABS tablet Take 1 tablet (20 mg total) by mouth daily with supper. 12/05/19   Minna Merritts, MD    Allergies Patient has no known allergies.  Family History  Problem Relation Age of Onset  . Bone cancer Father   . Stroke Father   . Diabetes Sister     Social History Social History   Tobacco Use  . Smoking status: Never Smoker  . Smokeless tobacco: Current User     Types: Chew  Vaping Use  . Vaping Use: Never used  Substance Use Topics  . Alcohol use: Yes    Alcohol/week: 24.0 standard drinks    Types: 24 Cans of beer per week    Comment: 6 beers daily, weekend drinks more-  . Drug use: Not Currently    Review of Systems  Constitutional: No fever/chills Eyes: No visual changes. ENT: No sore throat. Cardiovascular: Positive for chest pain. Respiratory: Positive for shortness of breath. Gastrointestinal: No abdominal pain.  No nausea, no vomiting.  No diarrhea.  No constipation. Genitourinary: Negative for dysuria. Musculoskeletal: Negative for back pain. Skin: Negative for rash. Neurological: Negative for headaches, focal weakness or numbness.   ____________________________________________   PHYSICAL EXAM:  VITAL SIGNS: ED Triage Vitals [02/25/20 0526]  Enc Vitals Group     BP (!) 149/110     Pulse Rate 93     Resp 18     Temp 98.3 F (36.8 C)     Temp Source Oral     SpO2 94 %     Weight 195 lb (88.5 kg)     Height 5\' 10"  (1.778 m)     Head Circumference      Peak Flow      Pain Score 0     Pain Loc      Pain Edu?      Excl. in [place]?     Constitutional: Alert and oriented.  Uncomfortable appearing and in mild to moderate acute distress. Eyes: Conjunctivae are normal. PERRL. EOMI. Head: Atraumatic. Nose: No congestion/rhinnorhea. Mouth/Throat: Mucous membranes are moist.   Neck: No stridor.   Cardiovascular: Tachycardic rate, irregular rhythm. Grossly normal heart sounds.  Good peripheral circulation. Respiratory: Normal respiratory effort.  No retractions. Lungs with faint bibasilar rales. Gastrointestinal: Soft and nontender. No distention. No abdominal bruits. No CVA tenderness. Musculoskeletal: No lower extremity tenderness nor edema.  No joint effusions. Neurologic:  Normal speech and language. No gross focal neurologic deficits are appreciated. No gait instability. Skin:  Skin is warm, dry and intact. No rash  noted. Psychiatric: Mood and affect are normal. Speech and behavior are normal.  ____________________________________________   LABS (all labs ordered are listed, but only abnormal results are displayed)  Labs Reviewed  COMPREHENSIVE METABOLIC PANEL - Abnormal; Notable for the following components:      Result Value   Glucose, Bld 110 (*)    AST 45 (*)    Total Bilirubin 1.4 (*)    All other components within normal limits  RESP PANEL BY RT-PCR (FLU A&B, COVID) ARPGX2  CBC WITH DIFFERENTIAL/PLATELET  BRAIN NATRIURETIC PEPTIDE  TROPONIN I (HIGH SENSITIVITY)   ____________________________________________  EKG  ED ECG REPORT I, Jadee Golebiewski J, the attending physician, personally viewed and interpreted this ECG.   Date: 02/25/2020  EKG Time:  0520  Rate: 177  Rhythm: atrial fibrillation, rate 177  Axis: Normal  Intervals:none  ST&T Change: Nonspecific  ____________________________________________  RADIOLOGY I, Annaleah Arata J, personally viewed and evaluated these images (plain radiographs) as part of my medical decision making, as well as reviewing the written report by the radiologist.  ED MD interpretation: Cardiomegaly  Official radiology report(s): DG Chest Port 1 View  Result Date: 02/25/2020 CLINICAL DATA:  Shortness of breath EXAM: PORTABLE CHEST 1 VIEW COMPARISON:  11/28/2019 and prior FINDINGS: Mild hypoinflation. No focal consolidation, pneumothorax or pleural effusion. Stable cardiomegaly. Multilevel spondylosis and chronic right rib deformities. IMPRESSION: No focal consolidation. Stable cardiomegaly. Electronically Signed   By: Primitivo Gauze M.D.   On: 02/25/2020 06:01    ____________________________________________   PROCEDURES  Procedure(s) performed (including Critical Care):  .1-3 Lead EKG Interpretation Performed by: Paulette Blanch, MD Authorized by: Paulette Blanch, MD     Interpretation: abnormal     ECG rate:  122   ECG rate assessment:  tachycardic     Rhythm: atrial fibrillation     Ectopy: none     Conduction: normal   Comments:     Patient placed on cardiac monitor to evaluate for arrhythmias   CRITICAL CARE Performed by: Paulette Blanch   Total critical care time: 45 minutes  Critical care time was exclusive of separately billable procedures and treating other patients.  Critical care was necessary to treat or prevent imminent or life-threatening deterioration.  Critical care was time spent personally by me on the following activities: development of treatment plan with patient and/or surrogate as well as nursing, discussions with consultants, evaluation of patient's response to treatment, examination of patient, obtaining history from patient or surrogate, ordering and performing treatments and interventions, ordering and review of laboratory studies, ordering and review of radiographic studies, pulse oximetry and re-evaluation of patient's condition.   ____________________________________________   INITIAL IMPRESSION / ASSESSMENT AND PLAN / ED COURSE  As part of my medical decision making, I reviewed the following data within the [facility] notes reviewed and incorporated, Labs reviewed, EKG interpreted, Old chart reviewed, Radiograph reviewed, Discussed with admitting physician and Notes from prior ED visits     59 year old male presenting in atrial fibrillation with rapid ventricular rate, chest tightness and shortness of breath Differential includes, but is not limited to, viral syndrome, bronchitis including COPD exacerbation, pneumonia, reactive airway disease including asthma, CHF including exacerbation with or without pulmonary/interstitial edema, pneumothorax, ACS, thoracic trauma, and pulmonary embolism.  We will obtain cardiac labs, chest x-ray.  Administer 15mg  IV diltiazem bolus and reassess.  Consider diltiazem drip as needed.  Anticipate hospitalization.  Clinical Course as  of 02/25/20 0622  Sat Feb 25, 2020  0553 Heart rate 110 after 15mg  IV diltiazem [JS]  0614 Patient's heart rate jumped to 140s with minimal exertion, ambulating to the commode.  Will initiate diltiazem drip.  Will discuss with hospitalist services for admission. [JS]    Clinical Course User Index [JS] Paulette Blanch, MD     ____________________________________________   FINAL CLINICAL IMPRESSION(S) / ED DIAGNOSES  Final diagnoses:  Atrial fibrillation with rapid ventricular response ([place])  Alcohol abuse     ED Discharge Orders    None      *Please note:  Calvin Esparza was evaluated in Emergency Department on 02/25/2020 for the symptoms described in the history of present illness. He was evaluated in the context of the global COVID-19 pandemic, which necessitated  consideration that the patient might be at risk for infection with the SARS-CoV-2 virus that causes COVID-19. Institutional protocols and algorithms that pertain to the evaluation of patients at risk for COVID-19 are in a state of rapid change based on information released by regulatory bodies including the CDC and federal and state organizations. These policies and algorithms were followed during the patient's care in the ED.  Some ED evaluations and interventions may be delayed as a result of limited staffing during and the pandemic.*   Note:  This document was prepared using Dragon voice recognition software and may include unintentional dictation errors.   Paulette Blanch, MD 02/25/20 [phone]

## 2020-02-25 NOTE — ED Notes (Signed)
Pt expressing concern about inability to swallow and pain with swallowing. Pt stating chronic ETOH use. MD made aware.

## 2020-02-26 DIAGNOSIS — I5022 Chronic systolic (congestive) heart failure: Secondary | ICD-10-CM

## 2020-02-26 LAB — BASIC METABOLIC PANEL
Anion gap: 10 (ref 5–15)
BUN: 17 mg/dL (ref 6–20)
CO2: 27 mmol/L (ref 22–32)
Calcium: 9.5 mg/dL (ref 8.9–10.3)
Chloride: 100 mmol/L (ref 98–111)
Creatinine, Ser: 1.18 mg/dL (ref 0.61–1.24)
GFR, Estimated: >60 mL/min (ref >60)
Glucose, Bld: 132 mg/dL — ABNORMAL HIGH (ref 70–99)
Potassium: 4.6 mmol/L (ref 3.5–5.1)
Sodium: 137 mmol/L (ref 135–145)

## 2020-02-26 LAB — PROTIME-INR
INR: 1.4 — ABNORMAL HIGH (ref 0.8–1.2)
Prothrombin Time: 16.4 seconds — ABNORMAL HIGH (ref 11.4–15.2)

## 2020-02-26 NOTE — Progress Notes (Signed)
Jonathon Bellows , MD [address] [place], [place], [place], [address] [address] [place], [place], [place], [address] Phone: [phone]  Fax: [phone]   Calvin Esparza is being followed for dysphagia day 1 of follow up   Subjective: Feeling good, ate bagel for breakfast no issues   Objective: Vital signs in last 24 hours: Vitals:   02/25/20 1746 02/25/20 2011 02/26/20 0348 02/26/20 0742  BP: (!) 123/93 118/83 (!) 126/93 (!) 124/92  Pulse: (!) 101 83 78 79  Resp: 18 18 16 19   Temp: 98.6 F (37 C) 98.3 F (36.8 C) 98.4 F (36.9 C) 98.1 F (36.7 C)  TempSrc:  Oral Oral Oral  SpO2: 95% 95% 96% 96%  Weight:   89 kg   Height:       Weight change: 0.59 kg  Intake/Output Summary (Last 24 hours) at 02/26/2020 6073 Last data filed at 02/25/2020 1525 Gross per 24 hour  Intake 67.98 ml  Output --  Net 67.98 ml     Exam: Heart:: Regular rate and rhythm, S1S2 present or without murmur or extra heart sounds Lungs: normal, clear to auscultation and clear to auscultation and percussion Abdomen: soft, nontender, normal bowel sounds   Lab Results: @LABTEST2 @ Micro Results: Recent Results (from the past 240 hour(s))  Resp Panel by RT-PCR (Flu A&B, Covid) Nasopharyngeal Swab     Status: None   Collection Time: 02/25/20  5:37 AM   Specimen: Nasopharyngeal Swab; Nasopharyngeal(NP) swabs in vial transport medium  Result Value Ref Range Status   SARS Coronavirus 2 by RT PCR NEGATIVE NEGATIVE Final    Comment: (NOTE) SARS-CoV-2 target nucleic acids are NOT DETECTED.  The SARS-CoV-2 RNA is generally detectable in upper respiratory specimens during the acute phase of infection. The lowest concentration of SARS-CoV-2 viral copies this assay can detect is 138 copies/mL. A negative result does not preclude SARS-Cov-2 infection and should not be used as the sole basis for treatment or other patient management decisions. A negative result may occur with  improper  specimen collection/handling, submission of specimen other than nasopharyngeal swab, presence of viral mutation(s) within the areas targeted by this assay, and inadequate number of viral copies(<138 copies/mL). A negative result must be combined with clinical observations, patient history, and epidemiological information. The expected result is Negative.  Fact Sheet for Patients:  EntrepreneurPulse.com.au  Fact Sheet for Healthcare Providers:  IncredibleEmployment.be  This test is no t yet approved or cleared by the [place] FDA and  has been authorized for detection and/or diagnosis of SARS-CoV-2 by FDA under an Emergency Use Authorization (EUA). This EUA will remain  in effect (meaning this test can be used) for the duration of the COVID-19 declaration under Section 564(b)(1) of the Act, 21 U.S.C.section 360bbb-3(b)(1), unless the authorization is terminated  or revoked sooner.       Influenza A by PCR NEGATIVE NEGATIVE Final   Influenza B by PCR NEGATIVE NEGATIVE Final    Comment: (NOTE) The Xpert Xpress SARS-CoV-2/FLU/RSV plus assay is intended as an aid in the diagnosis of influenza from Nasopharyngeal swab specimens and should not be used as a sole basis for treatment. Nasal washings and aspirates are unacceptable for Xpert Xpress SARS-CoV-2/FLU/RSV testing.  Fact Sheet for Patients: EntrepreneurPulse.com.au  Fact Sheet for Healthcare Providers: IncredibleEmployment.be  This test is not yet approved or cleared by the [place] FDA and has been authorized for detection and/or diagnosis of SARS-CoV-2 by FDA under an Emergency Use Authorization (EUA). This  EUA will remain in effect (meaning this test can be used) for the duration of the COVID-19 declaration under Section 564(b)(1) of the Act, 21 U.S.C. section 360bbb-3(b)(1), unless the authorization is terminated or revoked.  Performed at  [facility], [address] [place], [place] [address]    Studies/Results: [facility] Chest Port 1 View  Result Date: 02/25/2020 CLINICAL DATA:  Shortness of breath EXAM: PORTABLE CHEST 1 VIEW COMPARISON:  11/28/2019 and prior FINDINGS: Mild hypoinflation. No focal consolidation, pneumothorax or pleural effusion. Stable cardiomegaly. Multilevel spondylosis and chronic right rib deformities. IMPRESSION: No focal consolidation. Stable cardiomegaly. Electronically Signed   By: Primitivo Gauze M.D.   On: 02/25/2020 06:01   US Abdomen Limited RUQ (LIVER/GB)  Result Date: 02/25/2020 CLINICAL DATA:  Abdominal distension, history of alcohol abuse. EXAM: ULTRASOUND ABDOMEN LIMITED RIGHT UPPER QUADRANT COMPARISON:  CT abdomen pelvis 09/10/2016 FINDINGS: Gallbladder: Decompressed with diffusely thickened wall measuring up to 0.6 cm. Trace pericholecystic fluid. No gallstones identified. Negative sonographic Murphy sign. Common bile duct: Diameter: 0.5 cm, within normal limits. Liver: Nodular contour with heterogeneous echotexture. No focal lesion identified. Portal vein is patent on color Doppler imaging with normal direction of blood flow towards the liver. Other: No ascites visualized. IMPRESSION: 1. Diffuse mild gallbladder wall thickening which is a nonspecific finding and can be seen in the setting of gallbladder inflammation or secondary to cirrhosis, congestive heart failure, hepatitis, among others. No gallstones identified. 2.  Cirrhotic changes in the liver.  No focal lesion identified. 3.  No ascites. Electronically Signed   By: Audie Pinto M.D.   On: 02/25/2020 11:39   Medications: I have reviewed the patient's current medications. Scheduled Meds: . diltiazem  180 mg Oral Daily  . furosemide  40 mg Intravenous BID  . influenza vac split quadrivalent PF  0.5 mL Intramuscular Tomorrow-1000  . LORazepam  0-4 mg Intravenous Q6H  . losartan  25 mg Oral Daily  . metoprolol succinate   100 mg Oral BID  . pneumococcal 23 valent vaccine  0.5 mL Intramuscular Tomorrow-1000  . potassium chloride SA  20 mEq Oral Daily  . sodium chloride flush  3 mL Intravenous Q12H  . thiamine  100 mg Oral Daily   Continuous Infusions: . sodium chloride     PRN Meds:.sodium chloride, acetaminophen, colchicine **AND** probenecid, ondansetron (ZOFRAN) IV, sodium chloride flush   Assessment: Principal Problem:   Rapid atrial fibrillation (HCC) Active Problems:   Alcohol abuse   Pulmonary embolism (HCC)   NICM (nonischemic cardiomyopathy) (HCC)   Acute on chronic combined systolic (congestive) and diastolic (congestive) heart failure ([place])   [name] 59 y.o. male who has a history of alcohol abuse, chronic pain and nonischemic cardiomyopathy admitted with rapid atrial fibrillation felt to be due to alcohol intake. I had been consulted for dysphagia. The atrial fibrillation has been brought under control. Patient has been seen by cardiology. Eliquis has been held with last dose on 02/24/2020  Plan: 1. EGD tomorrow  I have discussed alternative options, risks & benefits,  which include, but are not limited to, bleeding, infection, perforation,respiratory complication & drug reaction.  The patient agrees with this plan & written consent will be obtained.      LOS: 1 day   Jonathon Bellows, MD 02/26/2020, 9:51 AM

## 2020-02-26 NOTE — Progress Notes (Signed)
Progress Note  Patient Name: Calvin Esparza Date of Encounter: 02/26/2020  Primary Cardiologist: Ida Rogue, MD   Subjective   Still some sob  Inpatient Medications    Scheduled Meds: . diltiazem  180 mg Oral Daily  . furosemide  40 mg Intravenous BID  . influenza vac split quadrivalent PF  0.5 mL Intramuscular Tomorrow-1000  . LORazepam  0-4 mg Intravenous Q6H  . losartan  25 mg Oral Daily  . metoprolol succinate  100 mg Oral BID  . pneumococcal 23 valent vaccine  0.5 mL Intramuscular Tomorrow-1000  . potassium chloride SA  20 mEq Oral Daily  . sodium chloride flush  3 mL Intravenous Q12H  . thiamine  100 mg Oral Daily   Continuous Infusions: . sodium chloride     PRN Meds: sodium chloride, acetaminophen, colchicine **AND** probenecid, ondansetron (ZOFRAN) IV, sodium chloride flush   Vital Signs    Vitals:   02/25/20 2011 02/26/20 0348 02/26/20 0742 02/26/20 1157  BP: 118/83 (!) 126/93 (!) 124/92 105/79  Pulse: 83 78 79 72  Resp: 18 16 19 19   Temp: 98.3 F (36.8 C) 98.4 F (36.9 C) 98.1 F (36.7 C) 98 F (36.7 C)  TempSrc: Oral Oral Oral Oral  SpO2: 95% 96% 96% 98%  Weight:  89 kg    Height:        Intake/Output Summary (Last 24 hours) at 02/26/2020 1206 Last data filed at 02/25/2020 1525 Gross per 24 hour  Intake 67.98 ml  Output --  Net 67.98 ml   Filed Weights   02/25/20 0526 02/26/20 0348  Weight: 88.5 kg 89 kg    Telemetry    Atrial fib with a CVR - Personally Reviewed  ECG    none - Personally Reviewed  Physical Exam   GEN: No acute distress.   Neck: 6 cm JVD Cardiac: IRRR, no murmurs, rubs, or gallops.  Respiratory: Clear to auscultation bilaterally. GI: Soft, nontender, non-distended  MS: No edema; No deformity. Neuro:  Nonfocal  Psych: Normal affect   Labs    Chemistry Recent Labs  Lab 02/25/20 0535 02/26/20 0629  NA 138 137  K 4.3 4.6  CL 101 100  CO2 22 27  GLUCOSE 110* 132*  BUN 16 17  CREATININE 1.24  1.18  CALCIUM 9.5 9.5  PROT 7.6  --   ALBUMIN 4.4  --   AST 45*  --   ALT 37  --   ALKPHOS 67  --   BILITOT 1.4*  --   GFRNONAA >60 >60  ANIONGAP 15 10     Hematology Recent Labs  Lab 02/25/20 0535  WBC 6.1  RBC 4.43  HGB 14.4  HCT 43.7  MCV 98.6  MCH 32.5  MCHC 33.0  RDW 15.3  PLT 182    Cardiac EnzymesNo results for input(s): TROPONINI in the last 168 hours. No results for input(s): TROPIPOC in the last 168 hours.   BNP Recent Labs  Lab 02/25/20 0537  BNP 815.5*     DDimer No results for input(s): DDIMER in the last 168 hours.   Radiology    DG Chest Port 1 View  Result Date: 02/25/2020 CLINICAL DATA:  Shortness of breath EXAM: PORTABLE CHEST 1 VIEW COMPARISON:  11/28/2019 and prior FINDINGS: Mild hypoinflation. No focal consolidation, pneumothorax or pleural effusion. Stable cardiomegaly. Multilevel spondylosis and chronic right rib deformities. IMPRESSION: No focal consolidation. Stable cardiomegaly. Electronically Signed   By: Primitivo Gauze M.D.   On: 02/25/2020 06:01  US Abdomen Limited RUQ (LIVER/GB)  Result Date: 02/25/2020 CLINICAL DATA:  Abdominal distension, history of alcohol abuse. EXAM: ULTRASOUND ABDOMEN LIMITED RIGHT UPPER QUADRANT COMPARISON:  CT abdomen pelvis 09/10/2016 FINDINGS: Gallbladder: Decompressed with diffusely thickened wall measuring up to 0.6 cm. Trace pericholecystic fluid. No gallstones identified. Negative sonographic Murphy sign. Common bile duct: Diameter: 0.5 cm, within normal limits. Liver: Nodular contour with heterogeneous echotexture. No focal lesion identified. Portal vein is patent on color Doppler imaging with normal direction of blood flow towards the liver. Other: No ascites visualized. IMPRESSION: 1. Diffuse mild gallbladder wall thickening which is a nonspecific finding and can be seen in the setting of gallbladder inflammation or secondary to cirrhosis, congestive heart failure, hepatitis, among others. No  gallstones identified. 2.  Cirrhotic changes in the liver.  No focal lesion identified. 3.  No ascites. Electronically Signed   By: Audie Pinto M.D.   On: 02/25/2020 11:39    Cardiac Studies   none  Patient Profile     59 y.o. male admitted with difficulty swallowing and atrial fib with a RVR  Assessment & Plan    1. Persistent atrial fib - his VR is much better controlled. Continue current meds. 2. Dysphagia - he is being evaluated by GI. Needs endoscopy. 3. Coags - he has likely been non-compliant. He can stop his anti-coagulation for EGD.  4. Chronic systolic heart failure -his symptoms are still not back to baseline. Continue lasix.     For questions or updates, please contact [place] Please consult [email or web address] for contact info under Cardiology/STEMI.      Signed, Cristopher Peru, MD  02/26/2020, 12:06 PM  Patient ID: Calvin Esparza, male   DOB: Nov 15, 1960, 59 y.o.   MRN: [id number]

## 2020-02-26 NOTE — Progress Notes (Signed)
Pt BP 82/59 and HR 54; pt c/o pre-syncope when getting up to bathroom earlier. Notified NP Ouma and per NP hold 2200 dose of metoprolol.  Earleen Reaper, RN

## 2020-02-26 NOTE — Progress Notes (Signed)
PROGRESS NOTE    Calvin Esparza  KGY:[id number] DOB: 10/25/1960 DOA: 02/25/2020 PCP: [name], MD  Outpatient Specialists: [facility]    Brief Narrative:   Calvin Esparza is a 59 y.o. male with medical history significant for chronic combined systolic and diastolic dysfunction CHF, history of A. fib, history of pulmonary embolism on anticoagulation, hypertension and alcohol abuse who presents to the emergency room for evaluation of worsening shortness of breath and palpitations.  Patient states that he has had shortness of breath since he was diagnosed with Covid almost a year ago but over the last couple of days his symptoms have gotten worse with any form of exertion he has to stop to catch his breath.  He also complains of increased abdominal girth and difficulty swallowing with early satiety. He complains of occasional chest tightness, constipation and palpitations but has no lower extremity swelling, no cough, no fever, no chills, no urinary symptoms, no dizziness or lightheadedness.   Assessment & Plan:   Principal Problem:   Rapid atrial fibrillation (HCC) Active Problems:   Alcohol abuse   Pulmonary embolism (HCC)   NICM (nonischemic cardiomyopathy) (HCC)   Acute on chronic combined systolic (congestive) and diastolic (congestive) heart failure ([place])   # Atrial fibrillation with rapid ventricular rate # Acute hypoxic respiratory failure Hx of such, symptomatic in setting of not taking his home dilt/metop. Now off dilt gtt, home dilt/metop resumed, rate is regular. O2 88% in Ed, currently on 2 L - cont home metop succ 100 bid, diltiazem 180 qd - hold home xarelto (see below), last dose evening of 12/10 - wean o2 as able - f/u cardiology recs  # Acute on chronic combined systolic and diastolic dysfunction CHF Patient's last 2D echocardiogram in March showed an LVEF of 35 to 40% - continue lasix 40 IV bid - I/os (none recorded) - cont metop,  losartan  # History of pulmonary embolism Compliant w/ home xarelto - last dose 12/10 evening, holding pending possible GI procedure  # Dysphagia 1 month of progressive dysphagia to solids and liquids w/ regurgitation. GI has seen, needs EGD - will communicate w/ GI about getting EGD or other diagnostic evaluation this hospitalization - last dose xarelto 12/10  # Alcohol use disorder # Cirrhosis Heavy drinker 5-6 drinks a day. New dx cirrhosis based on u/s 12/11. No ascites. Hasn't had EGD. Does not appear to be withdrawing - continue CIWA for now - f/u INR - outpatient GI f/u - possible inpt EGD as above   DVT prophylaxis: SCDs Code Status: full Family Communication: none @ bedside  Status is: Inpatient  Remains inpatient appropriate because:Inpatient level of care appropriate due to severity of illness   Dispo: The patient is from: Home              Anticipated d/c is to: Home              Anticipated d/c date is: 0-3 days              Patient currently is not medically stable to d/c.        Consultants:  GI, cardiology  Procedures: none  Antimicrobials:  none    Subjective: This morning feeling improved still some SOB when moves about, has appetite, no fevers, tolerated diet but still sensation of something sticking in throat.  Objective: Vitals:   02/25/20 1746 02/25/20 2011 02/26/20 0348 02/26/20 0742  BP: (!) 123/93 118/83 (!) 126/93 (!) 124/92  Pulse: Marland Kitchen)  101 83 78 79  Resp: 18 18 16 19   Temp: 98.6 F (37 C) 98.3 F (36.8 C) 98.4 F (36.9 C) 98.1 F (36.7 C)  TempSrc:  Oral Oral Oral  SpO2: 95% 95% 96% 96%  Weight:   89 kg   Height:        Intake/Output Summary (Last 24 hours) at 02/26/2020 0911 Last data filed at 02/25/2020 1525 Gross per 24 hour  Intake 67.98 ml  Output --  Net 67.98 ml   Filed Weights   02/25/20 0526 02/26/20 0348  Weight: 88.5 kg 89 kg    Examination:  General exam: Appears calm and comfortable   Respiratory system: Clear to auscultation. Respiratory effort normal. Cardiovascular system: S1 & S2 heard, irreg irreg. No JVD, murmurs, rubs, gallops or clicks. No pedal edema. Gastrointestinal system: Abdomen is nondistended, soft and nontender. No organomegaly or masses felt. Normal bowel sounds heard. Central nervous system: Alert and oriented. No focal neurological deficits. Extremities: Symmetric 5 x 5 power. Skin: No rashes, lesions or ulcers Psychiatry: Judgement and insight appear normal. Mood & affect appropriate.     Data Reviewed: I have personally reviewed following labs and imaging studies  CBC: Recent Labs  Lab 02/25/20 0535  WBC 6.1  NEUTROABS 3.6  HGB 14.4  HCT 43.7  MCV 98.6  PLT 616   Basic Metabolic Panel: Recent Labs  Lab 02/25/20 0535 02/26/20 0629  NA 138 137  K 4.3 4.6  CL 101 100  CO2 22 27  GLUCOSE 110* 132*  BUN 16 17  CREATININE 1.24 1.18  CALCIUM 9.5 9.5   GFR: Estimated Creatinine Clearance: 76.6 mL/min (by C-G formula based on SCr of 1.18 mg/dL). Liver Function Tests: Recent Labs  Lab 02/25/20 0535  AST 45*  ALT 37  ALKPHOS 67  BILITOT 1.4*  PROT 7.6  ALBUMIN 4.4   No results for input(s): LIPASE, AMYLASE in the last 168 hours. No results for input(s): AMMONIA in the last 168 hours. Coagulation Profile: No results for input(s): INR, PROTIME in the last 168 hours. Cardiac Enzymes: No results for input(s): CKTOTAL, CKMB, CKMBINDEX, TROPONINI in the last 168 hours. BNP (last 3 results) No results for input(s): PROBNP in the last 8760 hours. HbA1C: No results for input(s): HGBA1C in the last 72 hours. CBG: No results for input(s): GLUCAP in the last 168 hours. Lipid Profile: No results for input(s): CHOL, HDL, LDLCALC, TRIG, CHOLHDL, LDLDIRECT in the last 72 hours. Thyroid Function Tests: Recent Labs    02/25/20 1223  TSH 1.449   Anemia Panel: No results for input(s): VITAMINB12, FOLATE, FERRITIN, TIBC, IRON,  RETICCTPCT in the last 72 hours. Urine analysis: No results found for: COLORURINE, APPEARANCEUR, LABSPEC, PHURINE, GLUCOSEU, HGBUR, BILIRUBINUR, KETONESUR, PROTEINUR, UROBILINOGEN, NITRITE, LEUKOCYTESUR Sepsis Labs: @LABRCNTIP (procalcitonin:4,lacticidven:4)  ) Recent Results (from the past 240 hour(s))  Resp Panel by RT-PCR (Flu A&B, Covid) Nasopharyngeal Swab     Status: None   Collection Time: 02/25/20  5:37 AM   Specimen: Nasopharyngeal Swab; Nasopharyngeal(NP) swabs in vial transport medium  Result Value Ref Range Status   SARS Coronavirus 2 by RT PCR NEGATIVE NEGATIVE Final    Comment: (NOTE) SARS-CoV-2 target nucleic acids are NOT DETECTED.  The SARS-CoV-2 RNA is generally detectable in upper respiratory specimens during the acute phase of infection. The lowest concentration of SARS-CoV-2 viral copies this assay can detect is 138 copies/mL. A negative result does not preclude SARS-Cov-2 infection and should not be used as the sole basis for treatment  or other patient management decisions. A negative result may occur with  improper specimen collection/handling, submission of specimen other than nasopharyngeal swab, presence of viral mutation(s) within the areas targeted by this assay, and inadequate number of viral copies(<138 copies/mL). A negative result must be combined with clinical observations, patient history, and epidemiological information. The expected result is Negative.  Fact Sheet for Patients:  EntrepreneurPulse.com.au  Fact Sheet for Healthcare Providers:  IncredibleEmployment.be  This test is no t yet approved or cleared by the [place] FDA and  has been authorized for detection and/or diagnosis of SARS-CoV-2 by FDA under an Emergency Use Authorization (EUA). This EUA will remain  in effect (meaning this test can be used) for the duration of the COVID-19 declaration under Section 564(b)(1) of the Act,  21 U.S.C.section 360bbb-3(b)(1), unless the authorization is terminated  or revoked sooner.       Influenza A by PCR NEGATIVE NEGATIVE Final   Influenza B by PCR NEGATIVE NEGATIVE Final    Comment: (NOTE) The Xpert Xpress SARS-CoV-2/FLU/RSV plus assay is intended as an aid in the diagnosis of influenza from Nasopharyngeal swab specimens and should not be used as a sole basis for treatment. Nasal washings and aspirates are unacceptable for Xpert Xpress SARS-CoV-2/FLU/RSV testing.  Fact Sheet for Patients: EntrepreneurPulse.com.au  Fact Sheet for Healthcare Providers: IncredibleEmployment.be  This test is not yet approved or cleared by the [place] FDA and has been authorized for detection and/or diagnosis of SARS-CoV-2 by FDA under an Emergency Use Authorization (EUA). This EUA will remain in effect (meaning this test can be used) for the duration of the COVID-19 declaration under Section 564(b)(1) of the Act, 21 U.S.C. section 360bbb-3(b)(1), unless the authorization is terminated or revoked.  Performed at [facility], [address] [place], [place] [address]          Radiology Studies: DG Chest [place] 1 View  Result Date: 02/25/2020 CLINICAL DATA:  Shortness of breath EXAM: PORTABLE CHEST 1 VIEW COMPARISON:  11/28/2019 and prior FINDINGS: Mild hypoinflation. No focal consolidation, pneumothorax or pleural effusion. Stable cardiomegaly. Multilevel spondylosis and chronic right rib deformities. IMPRESSION: No focal consolidation. Stable cardiomegaly. Electronically Signed   By: Primitivo Gauze M.D.   On: 02/25/2020 06:01   US Abdomen Limited RUQ (LIVER/GB)  Result Date: 02/25/2020 CLINICAL DATA:  Abdominal distension, history of alcohol abuse. EXAM: ULTRASOUND ABDOMEN LIMITED RIGHT UPPER QUADRANT COMPARISON:  CT abdomen pelvis 09/10/2016 FINDINGS: Gallbladder: Decompressed with diffusely thickened wall measuring up  to 0.6 cm. Trace pericholecystic fluid. No gallstones identified. Negative sonographic Murphy sign. Common bile duct: Diameter: 0.5 cm, within normal limits. Liver: Nodular contour with heterogeneous echotexture. No focal lesion identified. Portal vein is patent on color Doppler imaging with normal direction of blood flow towards the liver. Other: No ascites visualized. IMPRESSION: 1. Diffuse mild gallbladder wall thickening which is a nonspecific finding and can be seen in the setting of gallbladder inflammation or secondary to cirrhosis, congestive heart failure, hepatitis, among others. No gallstones identified. 2.  Cirrhotic changes in the liver.  No focal lesion identified. 3.  No ascites. Electronically Signed   By: Audie Pinto M.D.   On: 02/25/2020 11:39        Scheduled Meds: . diltiazem  180 mg Oral Daily  . furosemide  40 mg Intravenous BID  . influenza vac split quadrivalent PF  0.5 mL Intramuscular Tomorrow-1000  . LORazepam  0-4 mg Intravenous Q6H  . losartan  25 mg Oral Daily  .  metoprolol succinate  100 mg Oral BID  . pneumococcal 23 valent vaccine  0.5 mL Intramuscular Tomorrow-1000  . potassium chloride SA  20 mEq Oral Daily  . sodium chloride flush  3 mL Intravenous Q12H  . thiamine  100 mg Oral Daily   Continuous Infusions: . sodium chloride       LOS: 1 day    Time spent: 72 min    Desma Maxim, MD [facility]   If 7PM-7AM, please contact night-coverage [email or web address] Password [email or web address] 02/26/2020, 9:11 AM

## 2020-02-27 ENCOUNTER — Inpatient Hospital Stay: Payer: 59 | Admitting: Anesthesiology

## 2020-02-27 ENCOUNTER — Encounter
Admission: EM | Disposition: A | Discharge status: Home / Self Care | Payer: Self-pay | Source: Home / Self Care | Attending: Obstetrics and Gynecology

## 2020-02-27 DIAGNOSIS — R1319 Other dysphagia: Secondary | ICD-10-CM

## 2020-02-27 DIAGNOSIS — I5043 Acute on chronic combined systolic (congestive) and diastolic (congestive) heart failure: Secondary | ICD-10-CM

## 2020-02-27 HISTORY — PX: ESOPHAGOGASTRODUODENOSCOPY (EGD) WITH PROPOFOL: SHX5813

## 2020-02-27 LAB — BASIC METABOLIC PANEL
Anion gap: 11 (ref 5–15)
BUN: 21 mg/dL — ABNORMAL HIGH (ref 6–20)
CO2: 26 mmol/L (ref 22–32)
Calcium: 9.3 mg/dL (ref 8.9–10.3)
Chloride: 102 mmol/L (ref 98–111)
Creatinine, Ser: 1.3 mg/dL — ABNORMAL HIGH (ref 0.61–1.24)
GFR, Estimated: >60 mL/min (ref >60)
Glucose, Bld: 116 mg/dL — ABNORMAL HIGH (ref 70–99)
Potassium: 4.2 mmol/L (ref 3.5–5.1)
Sodium: 139 mmol/L (ref 135–145)

## 2020-02-27 SURGERY — ESOPHAGOGASTRODUODENOSCOPY (EGD) WITH PROPOFOL
Anesthesia: General

## 2020-02-27 MED ORDER — LIDOCAINE HCL (CARDIAC) PF 100 MG/5ML IV SOSY
PREFILLED_SYRINGE | INTRAVENOUS | Status: DC | PRN
  Administered 2020-02-27: 100 mg via INTRAVENOUS

## 2020-02-27 MED ORDER — OMEPRAZOLE 20 MG PO CPDR: 20.0000 mg | DELAYED_RELEASE_CAPSULE | Freq: Two times a day (BID) | ORAL | 1 refills | Status: DC

## 2020-02-27 MED ORDER — PANTOPRAZOLE SODIUM 40 MG PO TBEC
40.0000 mg | DELAYED_RELEASE_TABLET | Freq: Two times a day (BID) | ORAL | Status: DC
  Filled 2020-02-27: qty 1

## 2020-02-27 MED ORDER — COVID-19 AD26 VACCINE(JANSSEN) 0.5 ML IM SUSP
0.5000 mL | Freq: Once | INTRAMUSCULAR | Status: AC
  Administered 2020-02-27: 0.5 mL via INTRAMUSCULAR
  Filled 2020-02-27: qty 0.5

## 2020-02-27 MED ORDER — PROPOFOL 10 MG/ML IV BOLUS
INTRAVENOUS | Status: DC | PRN
  Administered 2020-02-27: 40 mg via INTRAVENOUS

## 2020-02-27 MED ORDER — PROPOFOL 500 MG/50ML IV EMUL
INTRAVENOUS | Status: DC | PRN
  Administered 2020-02-27: 150 ug/kg/min via INTRAVENOUS

## 2020-02-27 MED ORDER — TORSEMIDE 20 MG PO TABS: 40.0000 mg | ORAL_TABLET | Freq: Every day | ORAL | Status: DC

## 2020-02-27 MED ORDER — FENTANYL CITRATE (PF) 100 MCG/2ML IJ SOLN
INTRAMUSCULAR | Status: DC | PRN
  Administered 2020-02-27: 50 ug via INTRAVENOUS

## 2020-02-27 MED ORDER — FENTANYL CITRATE (PF) 100 MCG/2ML IJ SOLN
INTRAMUSCULAR | Status: AC
  Filled 2020-02-27: qty 2

## 2020-02-27 MED ORDER — SODIUM CHLORIDE 0.9 % IV SOLN
INTRAVENOUS | Status: DC
  Administered 2020-02-27: 1000 mL via INTRAVENOUS

## 2020-02-27 MED ORDER — LOSARTAN POTASSIUM 25 MG PO TABS: 25.0000 mg | ORAL_TABLET | Freq: Every day | ORAL | 1 refills | Status: DC

## 2020-02-27 NOTE — Discharge Instructions (Signed)
Alcohol Abuse and Nutrition Alcohol abuse is any pattern of alcohol consumption that harms your health, relationships, or work. Alcohol abuse can cause poor nutrition (malnutrition or malnourishment) and a lack of nutrients (nutrient deficiencies), which can lead to more complications. Alcohol abuse brings malnutrition and nutrient deficiencies in two ways:  It causes your liver to work abnormally. This affects how your body divides (breaks down) and absorbs nutrients from food.  It causes you to eat poorly. Many people who abuse alcohol do not eat enough carbohydrates, protein, fat, vitamins, and minerals. Nutrients that are commonly lacking (deficient) in people who abuse alcohol include:  Vitamins. ? Vitamin A. This is needed for your vision, metabolism, and ability to fight off infections (immunity). ? B vitamins. These include folate, thiamine, and niacin. These are needed for new cell growth. ? Vitamin C. This plays an important role in wound healing, immunity, and helping your body to absorb iron. ? Vitamin D. This is necessary for your body to absorb and use calcium. It is produced by your liver, but you can also get it from food and from sun exposure.  Minerals. ? Calcium. This is needed for healthy bones as well as heart and blood vessel (cardiovascular) function. ? Iron. This is important for blood, muscle, and nervous system functioning. ? Magnesium. This plays an important role in muscle and nerve function, and it helps to control blood sugar and blood pressure. ? Zinc. This is important for the normal functioning of your nervous system and digestive system (gastrointestinal tract). If you think that you have an alcohol dependency problem, or if it is hard to stop drinking because you feel sick or different when you do not use alcohol, talk with your health care provider or another health professional about where to get help. Nutrition is an essential factor in therapy for  alcohol abuse. Your health care provider or diet and nutrition specialist (dietitian) will work with you to design a plan that can help to restore nutrients to your body and prevent the risk of complications. What is my plan? Your dietitian may develop a specific eating plan that is based on your condition and any other problems that you have. An eating plan will commonly include:  A balanced diet. ? Grains: 6-8 oz (170-227 g) a day. Examples of 1 oz of whole grains include 1 cup of whole-wheat cereal,  cup of brown rice, or 1 slice of whole-wheat bread. ? Vegetables: 2-3 cups a day. Examples of 1 cup of vegetables include 2 medium carrots, 1 large tomato, or 2 stalks of celery. ? Fruits: 1-2 cups a day. Examples of 1 cup of fruit include 1 large banana, 1 small apple, 8 large strawberries, or 1 large orange. ? Meat and other protein: 5-6 oz (142-170 g) a day.  A cut of meat or fish that is the size of a deck of cards is about 3-4 oz.  Foods that provide 1 oz of protein include 1 egg,  cup of nuts or seeds, or 1 tablespoon (16 g) of peanut butter. ? Dairy: 2-3 cups a day. Examples of 1 cup of dairy include 8 oz (230 mL) of milk, 8 oz (230 g) of yogurt, or 1 oz (44 g) of natural cheese.  Vitamin and mineral supplements. What are tips for following this plan?  Eat frequent meals and snacks. Try to eat 5-6 small meals each day.  Take vitamin or mineral supplements as recommended by your dietitian.  If you are  malnourished or if your dietitian recommends it: ? You may follow a high-protein, high-calorie diet. This may include:  2,000-3,000 calories (kilocalories) a day.  70-100 g (grams) of protein a day. ? You may be directed to follow a diet that includes a complete nutritional supplement beverage. This can help to restore calories, protein, and vitamins to your body. Depending on your condition, you may be advised to consume this beverage instead of your meals or in addition to  them.  Certain medicines may cause changes in your appetite, taste, and weight. Work with your health care provider and dietitian to make any changes to your medicines and eating plan.  If you are unable to take in enough food and calories by mouth, your health care provider may recommend a feeding tube. This tube delivers nutritional supplements directly to your stomach. Recommended foods  Eat foods that are high in molecules that prevent oxygen from reacting with your food (antioxidants). These foods include grapes, berries, nuts, green tea, and dark green or orange vegetables. Eating these can help to prevent some of the stress that is placed on your liver by consuming alcohol.  Eat a variety of fresh fruits and vegetables each day. This will help you to get fiber and vitamins in your diet.  Drink plenty of water and other clear fluids, such as apple juice and broth. Try to drink at least 48-64 oz (1.5-2 L) of water a day.  Include foods fortified with vitamins and minerals in your diet. Commonly fortified foods include milk, orange juice, cereal, and bread.  Eat a variety of foods that are high in omega-3 and omega-6 fatty acids. These include fish, nuts and seeds, and soybeans. These foods may help your liver to recover and may also stabilize your mood.  If you are a vegetarian: ? Eat a variety of protein-rich foods. ? Pair whole grains with plant-based proteins at meals and snack time. For example, eat rice with beans, put peanut butter on whole-grain toast, or eat oatmeal with sunflower seeds. The items listed above may not be a complete list of foods and beverages you can eat. Contact a dietitian for more information. Foods to avoid  Avoid foods and drinks that are high in fat and sugar. Sugary drinks, salty snacks, and candy contain empty calories. This means that they lack important nutrients such as protein, fiber, and vitamins.  Avoid alcohol. This is the best way to avoid  malnutrition due to alcohol abuse. If you must drink, drink measured amounts. Measured drinking means limiting your intake to no more than 1 drink a day for nonpregnant women and 2 drinks a day for men. One drink equals 12 oz (355 mL) of beer, 5 oz (148 mL) of wine, or 1 oz (44 mL) of hard liquor.  Limit your intake of caffeine. Replace drinks like coffee and black tea with decaffeinated coffee and decaffeinated herbal tea. The items listed above may not be a complete list of foods and beverages you should avoid. Contact a dietitian for more information. Summary  Alcohol abuse can cause poor nutrition (malnutrition or malnourishment) and a lack of nutrients (nutrient deficiencies), which can lead to more health problems.  Common nutrient deficiencies include vitamin deficiencies (A, B, C, and D) and mineral deficiencies (calcium, iron, magnesium, and zinc).  Nutrition is an essential factor in therapy for alcohol abuse.  Your health care provider and dietitian can help you to develop a specific eating plan that includes a balanced diet plus vitamin  and mineral supplements. This information is not intended to replace advice given to you by your health care provider. Make sure you discuss any questions you have with your health care provider. Document Revised: 06/22/2018 Document Reviewed: 11/18/2016 Elsevier Patient Education  [place]. Atrial Fibrillation  Atrial fibrillation is a type of heartbeat that is irregular or fast. If you have this condition, your heart beats without any order. This makes it hard for your heart to pump blood in a normal way. Atrial fibrillation may come and go, or it may become a long-lasting problem. If this condition is not treated, it can put you at higher risk for stroke, heart failure, and other heart problems. What are the causes? This condition may be caused by diseases that damage the heart. They include:  High blood pressure.  Heart  failure.  Heart valve disease.  Heart surgery. Other causes include:  Diabetes.  Thyroid disease.  Being overweight.  Kidney disease. Sometimes the cause is not known. What increases the risk? You are more likely to develop this condition if:  You are older.  You smoke.  You exercise often and very hard.  You have a family history of this condition.  You are a man.  You use drugs.  You drink a lot of alcohol.  You have lung conditions, such as emphysema, pneumonia, or COPD.  You have sleep apnea. What are the signs or symptoms? Common symptoms of this condition include:  A feeling that your heart is beating very fast.  Chest pain or discomfort.  Feeling short of breath.  Suddenly feeling light-headed or weak.  Getting tired easily during activity.  Fainting.  Sweating. In some cases, there are no symptoms. How is this treated? Treatment for this condition depends on underlying conditions and how you feel when you have atrial fibrillation. They include:  Medicines to: ? Prevent blood clots. ? Treat heart rate or heart rhythm problems.  Using devices, such as a pacemaker, to correct heart rhythm problems.  Doing surgery to remove the part of the heart that sends bad signals.  Closing an area where clots can form in the heart (left atrial appendage). In some cases, your doctor will treat other underlying conditions. Follow these instructions at home: Medicines  Take over-the-counter and prescription medicines only as told by your doctor.  Do not take any new medicines without first talking to your doctor.  If you are taking blood thinners: ? Talk with your doctor before you take any medicines that have aspirin or NSAIDs, such as ibuprofen, in them. ? Take your medicine exactly as told by your doctor. Take it at the same time each day. ? Avoid activities that could hurt or bruise you. Follow instructions about how to prevent falls. ? Wear a  bracelet that says you are taking blood thinners. Or, carry a card that lists what medicines you take. Lifestyle      Do not use any products that have nicotine or tobacco in them. These include cigarettes, e-cigarettes, and chewing tobacco. If you need help quitting, ask your doctor.  Eat heart-healthy foods. Talk with your doctor about the right eating plan for you.  Exercise regularly as told by your doctor.  Do not drink alcohol.  Lose weight if you are overweight.  Do not use drugs, including cannabis. General instructions  If you have a condition that causes breathing to stop for a short period of time (apnea), treat it as told by your doctor.  Keep  a healthy weight. Do not use diet pills unless your doctor says they are safe for you. Diet pills may make heart problems worse.  Keep all follow-up visits as told by your doctor. This is important. Contact a doctor if:  You notice a change in the speed, rhythm, or strength of your heartbeat.  You are taking a blood-thinning medicine and you get more bruising.  You get tired more easily when you move or exercise.  You have a sudden change in weight. Get help right away if:   You have pain in your chest or your belly (abdomen).  You have trouble breathing.  You have side effects of blood thinners, such as blood in your vomit, poop (stool), or pee (urine), or bleeding that cannot stop.  You have any signs of a stroke. "BE FAST" is an easy way to remember the main warning signs: ? B - Balance. Signs are dizziness, sudden trouble walking, or loss of balance. ? E - Eyes. Signs are trouble seeing or a change in how you see. ? F - Face. Signs are sudden weakness or loss of feeling in the face, or the face or eyelid drooping on one side. ? A - Arms. Signs are weakness or loss of feeling in an arm. This happens suddenly and usually on one side of the body. ? S - Speech. Signs are sudden trouble speaking, slurred speech, or  trouble understanding what people say. ? T - Time. Time to call emergency services. Write down what time symptoms started.  You have other signs of a stroke, such as: ? A sudden, very bad headache with no known cause. ? Feeling like you may vomit (nausea). ? Vomiting. ? A seizure. These symptoms may be an emergency. Do not wait to see if the symptoms will go away. Get medical help right away. Call your local emergency services (911 in the [place] Do not drive yourself to the hospital. Summary  Atrial fibrillation is a type of heartbeat that is irregular or fast.  You are at higher risk of this condition if you smoke, are older, have diabetes, or are overweight.  Follow your doctor's instructions about medicines, diet, exercise, and follow-up visits.  Get help right away if you have signs or symptoms of a stroke.  Get help right away if you cannot catch your breath, or you have chest pain or discomfort. This information is not intended to replace advice given to you by your health care provider. Make sure you discuss any questions you have with your health care provider. Document Revised: 08/25/2018 Document Reviewed: 08/25/2018 Elsevier Patient Education  [place].

## 2020-02-27 NOTE — Plan of Care (Signed)

## 2020-02-27 NOTE — TOC Initial Note (Signed)
Transition of Care [facility]) - Initial/Assessment Note    Patient Details  Name: Calvin Esparza MRN: [id number] Date of Birth: 08/13/1960  Transition of Care [facility]) CM/SW Contact:    Eileen Stanford, LCSW Phone Number: 02/27/2020, 3:27 PM  Clinical Narrative:     Patient asleep. Pt's spouse present at bedside. Pt's spouse states pt has a scale at home and is aware to weigh himself daily and notify his doctor if there is weight gain. Pt is awaiting his covid shot and then pt will d/c home. No additional needs at this time.              Expected Discharge Plan: Home/Self Care Barriers to Discharge: No Barriers Identified   Patient Goals and CMS Choice        Expected Discharge Plan and Services Expected Discharge Plan: Home/Self Care In-house Referral: Clinical Social Work   Post Acute Care Choice: NA Living arrangements for the past 2 months: Single Family Home Expected Discharge Date: 02/27/20                                    Prior Living Arrangements/Services Living arrangements for the past 2 months: Single Family Home Lives with:: Spouse Patient language and need for interpreter reviewed:: Yes Do you feel safe going back to the place where you live?: Yes      Need for Family Participation in Patient Care: Yes (Comment) Care giver support system in place?: Yes (comment)   Criminal Activity/Legal Involvement Pertinent to Current Situation/Hospitalization: No - Comment as needed  Activities of Daily Living Home Assistive Devices/Equipment: None ADL Screening (condition at time of admission) Patient's cognitive ability adequate to safely complete daily activities?: Yes Is the patient deaf or have difficulty hearing?: No Does the patient have difficulty seeing, even when wearing glasses/contacts?: No Does the patient have difficulty concentrating, remembering, or making decisions?: No Patient able to express need for assistance with ADLs?: Yes Does the patient  have difficulty dressing or bathing?: No Independently performs ADLs?: Yes (appropriate for developmental age) Does the patient have difficulty walking or climbing stairs?: No Weakness of Legs: None Weakness of Arms/Hands: None  Permission Sought/Granted Permission sought to share information with : Family Supports    Share Information with NAME: Hinton Dyer     Permission granted to share info w Relationship: spouse     Emotional Assessment Appearance:: Appears stated age Attitude/Demeanor/Rapport: Unable to Assess Affect (typically observed): Unable to Assess Orientation: : Oriented to Self,Oriented to Place,Oriented to  Time,Oriented to Situation Alcohol / Substance Use: Not Applicable Psych Involvement: No (comment)  Admission diagnosis:  Alcohol abuse [F10.10] Distended abdomen [R14.0] Rapid atrial fibrillation (HCC) [I48.91] Atrial fibrillation with rapid ventricular response (HCC) [I48.91] Acute on chronic combined systolic (congestive) and diastolic (congestive) heart failure ([place]) [I50.43] Patient Active Problem List   Diagnosis Date Noted  . Rapid atrial fibrillation ([place]) 02/25/2020  . Acute on chronic combined systolic (congestive) and diastolic (congestive) heart failure ([place]) 02/25/2020  . Pre-syncope 10/10/2019  . Acute on chronic HFrEF (heart failure with reduced ejection fraction) ([place])   . Persistent atrial fibrillation ([place])   . NICM (nonischemic cardiomyopathy) ([place]) 06/09/2019  . Chronic combined systolic and diastolic CHF (congestive heart failure) ([place]) 06/08/2019  . Pulmonary embolism ([place]) 06/07/2019  . Atrial fibrillation with RVR ([place]) 10/01/2018  . Alcohol abuse 10/01/2018   PCP:  [name], MD Pharmacy:  [id number] [address], [place] [address] Phone: [phone] Fax: [phone]     Social Determinants of Health (SDOH) Interventions    Readmission Risk Interventions No  flowsheet data found.

## 2020-02-27 NOTE — Anesthesia Procedure Notes (Signed)
Procedure Name: MAC Date/Time: 02/27/2020 12:13 PM Performed by: Lily Peer, Tegan Britain, CRNA Pre-anesthesia Checklist: Patient identified, Emergency Drugs available, Suction available, Patient being monitored and Timeout performed Oxygen Delivery Method: Nasal cannula Induction Type: IV induction

## 2020-02-27 NOTE — Transfer of Care (Signed)
Immediate Anesthesia Transfer of Care Note  Patient: Calvin Esparza  Procedure(s) Performed: ESOPHAGOGASTRODUODENOSCOPY (EGD) WITH PROPOFOL (N/A )  Patient Location: PACU and Endoscopy Unit  Anesthesia Type:General  Level of Consciousness: drowsy  Airway & Oxygen Therapy: Patient Spontanous Breathing  Post-op Assessment: Report given to RN and Post -op Vital signs reviewed and stable  Post vital signs: Reviewed and stable  Last Vitals:  Vitals Value Taken Time  BP 101/72   Temp    Pulse 70 02/27/20 1222  Resp 14 02/27/20 1222  SpO2 94 % 02/27/20 1222  Vitals shown include unvalidated device data.  Last Pain:  Vitals:   02/27/20 1122  TempSrc: Oral  PainSc:          Complications: No complications documented.

## 2020-02-27 NOTE — H&P (Signed)
Cephas Darby, MD [address]  [place]  [place], [place] [address]  Main: [phone]  Fax: [phone] Pager: [phone]  Primary Care Physician:  [name], MD Primary Gastroenterologist:  Dr. Cephas Darby  Pre-Procedure History & Physical: HPI:  Calvin Esparza is a 59 y.o. male is here for an endoscopy.   Past Medical History:  Diagnosis Date  . Alcohol abuse   . Chronic pain   . COVID-19    a. 02/2019  . HFrEF (heart failure with reduced ejection fraction) ([place])    a. 05/2019 Echo: EF 35-40%.  . History of medication noncompliance   . Longstanding persistent atrial fibrillation ([place])    a. Dx 03/2018; b. 05/2019 recurrent Afib in setting of PE. CHA2DS2VASc = 1-->Eliquis later changed to xarelto; b. 07/2019 s/p DCCV (150J); d. 08/2019 admit w/ AF RVR in settting of noncompliance - longstanding persistent afib since on bb/ccb rx.  Marland Kitchen NICM (nonischemic cardiomyopathy) ([place])    a. 05/2019 Echo: EF 35-40%, gr1 DD. Mod enlarged RV. Mildly dil LA. Mild to mod dil RA. Triv AI; b. 05/2019 MV: EF 32%, no ischemia.  . Pulmonary embolism ([place])    a. 05/2019 CTA Chest: mild amt of PE w/in a lower lobe branch of RPA; b. 08/2019 CTA Chest: Small PE in RML and RLL PA.    Past Surgical History:  Procedure Laterality Date  . CARDIOVERSION N/A 07/29/2019   Procedure: CARDIOVERSION;  Surgeon: Minna Merritts, MD;  Location: [facility] ORS;  Service: Cardiovascular;  Laterality: N/A;    Prior to Admission medications   Medication Sig Start Date End Date Taking? Authorizing Provider  colchicine-probenecid 0.5-500 MG tablet Take 1 tablet by mouth 2 (two) times daily as needed (joint pain). 09/26/19  Yes [provider]  diltiazem (CARDIZEM CD) 180 MG 24 hr capsule Take 180 mg by mouth daily.   Yes [provider]  metoprolol succinate (TOPROL-XL) 100 MG 24 hr tablet Take 1 tablet (100 mg total) by mouth in the morning and at bedtime. Take with or immediately  following a meal. 10/11/19  Yes Mariel Aloe, MD  metoprolol succinate (TOPROL-XL) 50 MG 24 hr tablet Take 1 tablet (50 mg total) by mouth in the morning and at bedtime. Take with or immediately following a meal. Patient taking differently: Take 50 mg by mouth in the morning and at bedtime. 10/04/19  Yes Gollan, Kathlene November, MD  potassium chloride SA (KLOR-CON) 20 MEQ tablet Take 1 tablet (20 mEq total) by mouth daily. 12/05/19  Yes Minna Merritts, MD  torsemide (DEMADEX) 20 MG tablet Take 40 mg by mouth daily.   Yes [provider]  XARELTO 20 MG TABS tablet Take 1 tablet (20 mg total) by mouth daily with supper. 12/05/19  Yes Minna Merritts, MD    Allergies as of 02/25/2020  . (No Known Allergies)    Family History  Problem Relation Age of Onset  . Bone cancer Father   . Stroke Father   . Diabetes Sister     Social History   Socioeconomic History  . Marital status: Married    Spouse name: Not on file  . Number of children: Not on file  . Years of education: Not on file  . Highest education level: Not on file  Occupational History  . Not on file  Tobacco Use  . Smoking status: Never Smoker  . Smokeless tobacco: Current User    Types: Chew  Vaping Use  .  Vaping Use: Never used  Substance and Sexual Activity  . Alcohol use: Yes    Alcohol/week: 24.0 standard drinks    Types: 24 Cans of beer per week    Comment: 6 beers daily, weekend drinks more-  . Drug use: Not Currently  . Sexual activity: Not on file  Other Topics Concern  . Not on file  Social History Narrative   Lives locally w/ wife.  Does not routinely exercise.  Works full time in Development worker, international aid.   Social Determinants of Health   Financial Resource Strain: Not on file  Food Insecurity: Not on file  Transportation Needs: Not on file  Physical Activity: Not on file  Stress: Not on file  Social Connections: Not on file  Intimate Partner Violence: Not on file    Review of Systems: See HPI,  otherwise negative ROS  Physical Exam: BP (!) 113/95 (BP Location: Right Arm)   Pulse 84   Temp 98.1 F (36.7 C) (Oral)   Resp 19   Ht 5\' 10"  (1.778 m)   Wt 88.4 kg   SpO2 96%   BMI 27.95 kg/m  General:   Alert,  pleasant and cooperative in NAD Head:  Normocephalic and atraumatic. Neck:  Supple; no masses or thyromegaly. Lungs:  Clear throughout to auscultation.    Heart:  Regular rate and rhythm. Abdomen:  Soft, nontender and nondistended. Normal bowel sounds, without guarding, and without rebound.   Neurologic:  Alert and  oriented x4;  grossly normal neurologically.  Impression/Plan: Calvin Esparza is here for an endoscopy to be performed for dysphagia  Risks, benefits, limitations, and alternatives regarding  endoscopy have been reviewed with the patient.  Questions have been answered.  All parties agreeable.   Sherri Sear, MD  02/27/2020, 11:48 AM

## 2020-02-27 NOTE — Anesthesia Postprocedure Evaluation (Signed)
Anesthesia Post Note  Patient: Calvin Esparza  Procedure(s) Performed: ESOPHAGOGASTRODUODENOSCOPY (EGD) WITH PROPOFOL (N/A )  Patient location during evaluation: Endoscopy Anesthesia Type: General Level of consciousness: awake and alert Pain management: pain level controlled Vital Signs Assessment: post-procedure vital signs reviewed and stable Respiratory status: spontaneous breathing, nonlabored ventilation, respiratory function stable and patient connected to nasal cannula oxygen Cardiovascular status: blood pressure returned to baseline and stable Postop Assessment: no apparent nausea or vomiting Anesthetic complications: no   No complications documented.   Last Vitals:  Vitals:   02/27/20 1222 02/27/20 1232  BP: 101/73 105/71  Pulse: 70 67  Resp: 14 17  Temp: (!) 35.9 C   SpO2: 94% 98%    Last Pain:  Vitals:   02/27/20 1232  TempSrc:   PainSc: 0-No pain                 Arita Miss

## 2020-02-27 NOTE — Progress Notes (Signed)
SLP Cancellation Note  Patient Details Name: Calvin Esparza MRN: [id number] DOB: [birth year]   Cancelled treatment:       Reason Eval/Treat Not Completed: Patient at procedure or test/unavailable (chart reviewed; consulted NSG re: pt's status). Per chart review and NSG report, pt is scheduled for an EGD today. Per GI notes in chart, pt says that "for the past few months has had difficulty swallowing solids and liquids.  Associated with some heartburn.  Feels the food gets stuck in the center of his chest.  Denies any weight loss.  Drinks alcohol daily". ST services will hold on BSE at this time and defer to GI. Will f/u on pt's status tomorrow as he is NPO for procedure and this appears to be more of a GI dysphagia(Esophageal phase) than oropharyngeal. NSG agreed.     Orinda Kenner, MS, CCC-SLP Speech Language Pathologist Rehab Services [phone] [facility] 02/27/2020, 10:31 AM

## 2020-02-27 NOTE — Progress Notes (Signed)
Progress Note  Patient Name: Calvin Esparza Date of Encounter: 02/27/2020  Primary Cardiologist: Rockey Situ  Subjective   Status post EGD today with erosions without bleeding noted. No documented UOP this admission. Weight 89-->88.4 kg. No chest pain, palpitations, dyspnea, dizziness, presyncope, or syncope. He states "it's not a problem of  Remembering to take my medicines, it's a problem of wanting to."  Inpatient Medications    Scheduled Meds: . diltiazem  180 mg Oral Daily  . furosemide  40 mg Intravenous BID  . losartan  25 mg Oral Daily  . metoprolol succinate  100 mg Oral BID  . pantoprazole  40 mg Oral BID AC  . potassium chloride SA  20 mEq Oral Daily  . sodium chloride flush  3 mL Intravenous Q12H  . thiamine  100 mg Oral Daily   Continuous Infusions: . sodium chloride     PRN Meds: sodium chloride, acetaminophen, colchicine **AND** probenecid, ondansetron (ZOFRAN) IV, sodium chloride flush   Vital Signs    Vitals:   02/27/20 1122 02/27/20 1222 02/27/20 1232 02/27/20 1242  BP: (!) 113/95 101/73 105/71 123/90  Pulse: 84 70 67 73  Resp: 19 14 17 17   Temp: 98.1 F (36.7 C) (!) 96.6 F (35.9 C)    TempSrc: Oral Temporal    SpO2: 96% 94% 98% 94%  Weight:      Height:        Intake/Output Summary (Last 24 hours) at 02/27/2020 1351 Last data filed at 02/27/2020 1213 Gross per 24 hour  Intake 200 ml  Output --  Net 200 ml   Filed Weights   02/25/20 0526 02/26/20 0348 02/27/20 0538  Weight: 88.5 kg 89 kg 88.4 kg    Telemetry    Afib, 60s to 80s bpm - Personally Reviewed  ECG    No new tracings - Personally Reviewed  Physical Exam   GEN: No acute distress.   Neck: No JVD. Cardiac: IRIR, no murmurs, rubs, or gallops.  Respiratory: Clear to auscultation bilaterally.  GI: Soft, nontender, non-distended.   MS: No edema; No deformity. Neuro:  Alert and oriented x 3; Nonfocal.  Psych: Normal affect.  Labs    Chemistry Recent Labs  Lab  02/25/20 0535 02/26/20 0629 02/27/20 0612  NA 138 137 139  K 4.3 4.6 4.2  CL 101 100 102  CO2 22 27 26   GLUCOSE 110* 132* 116*  BUN 16 17 21*  CREATININE 1.24 1.18 1.30*  CALCIUM 9.5 9.5 9.3  PROT 7.6  --   --   ALBUMIN 4.4  --   --   AST 45*  --   --   ALT 37  --   --   ALKPHOS 67  --   --   BILITOT 1.4*  --   --   GFRNONAA >60 >60 >60  ANIONGAP 15 10 11      Hematology Recent Labs  Lab 02/25/20 0535  WBC 6.1  RBC 4.43  HGB 14.4  HCT 43.7  MCV 98.6  MCH 32.5  MCHC 33.0  RDW 15.3  PLT 182    Cardiac EnzymesNo results for input(s): TROPONINI in the last 168 hours. No results for input(s): TROPIPOC in the last 168 hours.   BNP Recent Labs  Lab 02/25/20 0537  BNP 815.5*     DDimer No results for input(s): DDIMER in the last 168 hours.   Radiology    No results found.  Cardiac Studies   2D echo 05/2019: 1.  Left ventricular ejection fraction, by estimation, is 35 to 40%. The  left ventricle has moderately decreased function. The left ventricle  demonstrates global hypokinesis. There is mild left ventricular  hypertrophy. Left ventricular diastolic  parameters are consistent with Grade I diastolic dysfunction (impaired  relaxation).  2. Right ventricular systolic function is mildly reduced. The right  ventricular size is moderately enlarged.  3. Left atrial size was mildly dilated.  4. Right atrial size was mild to moderately dilated.  5. The mitral valve is grossly normal. No evidence of mitral valve  regurgitation.  6. The aortic valve is tricuspid. Aortic valve regurgitation is trivial.  7. The inferior vena cava is normal in size with greater than 50%  respiratory variability, suggesting right atrial pressure of 3 mmHg. __________  Carlton Adam MPI 05/2019: Pharmacological myocardial perfusion imaging study with no significant ischemia Moderate global hypokinesis, EF estimated at 32% No EKG changes concerning for ischemia at peak stress or in  recovery. Baseline EKG with atrial fibrillation, rate 100 bpm Low risk scan  Patient Profile     59 y.o. male with history of persistent Afib s/p DCCV in in 5/201 on [place] with noncompliance, HFrEF secondary to NICM, PE in 05/2019 noncompliant with anticoagulation with recurrent PE in 08/2019, COVID-19 in 02/2019, alcohol abuse, and chronic pain who we are seeing for volume overload and Afib with RVR in the context of medical noncompliance.  Assessment & Plan    1. Longstanding persistent Afib with RVR: -Ventricular rates well controlled in the 60s to 80s bpm -Continue rate control with PTA Cardizem CD 180 mg and Toprol XL 100 mg bid -CHADS2VASc 1-2 -Xarelto to be resumed when safe per GI following EGD today  -Estimated Creatinine Clearance: 69.4 mL/min (A) (by C-G formula based on SCr of 1.3 mg/dL (H)).  2. HFrEF secondary to NICM: -Volume status is much improved -Appears euvolemic -He does remain on Cardizem CD for rate control, along with Toprol XL for rate control. This regimen in the only thing that has reasonably controlled his ventricular rates in Afib, when he does take them -There has been significant dietary noncompliance (eating [employer] in the ED this admission) -CHF education   3. Dysphagia: -Status post EGD today by GI with erosions without bleeding noted  4. Medication nonadherence: -Compliance advised  -Long discussion with patent and wife today  5. EtOH use: -Cessation advised   6. AKI: -In the setting of over diuresis -Stop IV Lasix -Decrease PTA torsemide to 20 mg daily at discharge    For questions or updates, please contact [place] Please consult [email or web address] for contact info under Cardiology/STEMI.    Signed, Christell Faith, PA-C [place] Pager: [phone] 02/27/2020, 1:51 PM

## 2020-02-27 NOTE — Op Note (Addendum)
[facility] Patient Name: Calvin Esparza Procedure Date: 02/27/2020 11:41 AM MRN: [id number] Account #: [id number] Date of Birth: 1960-11-12 Admit Type: Inpatient Age: 59 Room: [facility] ENDO ROOM 1 Gender: Male Note Status: Finalized Procedure:             Upper GI endoscopy Indications:           Esophageal dysphagia Providers:             Lin Landsman MD, MD Medicines:             General Anesthesia Complications:         No immediate complications. Estimated blood loss: None. Procedure:             Pre-Anesthesia Assessment:                        - Prior to the procedure, a History and Physical was                         performed, and patient medications and allergies were                         reviewed. The patient is competent. The risks and                         benefits of the procedure and the sedation options and                         risks were discussed with the patient. All questions                         were answered and informed consent was obtained.                         Patient identification and proposed procedure were                         verified by the physician, the nurse, the                         anesthesiologist, the anesthetist and the technician                         in the pre-procedure area in the procedure room in the                         endoscopy suite. Mental Status Examination: alert and                         oriented. Airway Examination: normal oropharyngeal                         airway and neck mobility. Respiratory Examination:                         clear to auscultation. CV Examination: irregularly                         irregular rate and rhythm. Prophylactic Antibiotics:  The patient does not require prophylactic antibiotics.                         Prior Anticoagulants: The patient has taken Xarelto                         (rivaroxaban), last dose was  3 days prior to                         procedure. ASA Grade Assessment: III - A patient with                         severe systemic disease. After reviewing the risks and                         benefits, the patient was deemed in satisfactory                         condition to undergo the procedure. The anesthesia                         plan was to use general anesthesia. Immediately prior                         to administration of medications, the patient was                         re-assessed for adequacy to receive sedatives. The                         heart rate, respiratory rate, oxygen saturations,                         blood pressure, adequacy of pulmonary ventilation, and                         response to care were monitored throughout the                         procedure. The physical status of the patient was                         re-assessed after the procedure.                        After obtaining informed consent, the endoscope was                         passed under direct vision. Throughout the procedure,                         the patient's blood pressure, pulse, and oxygen                         saturations were monitored continuously. The Endoscope                         was introduced through the mouth, and advanced to the  second part of duodenum. The upper GI endoscopy was                         accomplished without difficulty. The patient tolerated                         the procedure fairly well. Findings:      The duodenal bulb and second portion of the duodenum were normal.      Multiple dispersed small erosions with no bleeding and no stigmata of       recent bleeding were found in the gastric fundus, at the incisura, in       the gastric antrum and in the prepyloric region of the stomach. Biopsies       were taken with a cold forceps for Helicobacter pylori testing.      The cardia and gastric fundus were normal on  retroflexion.      Esophagogastric landmarks were identified: the gastroesophageal junction       was found at 40 cm from the incisors.      Solitary linear White nummular lesion was noted in the mid esophagus.       Biopsies were taken with a cold forceps for histology.      Mild portal hypertensive gastropathy was found in the entire examined       stomach. Biopsies were taken with a cold forceps for histology. Impression:            - Normal duodenal bulb and second portion of the                         duodenum.                        - Erosive gastropathy with no bleeding and no stigmata                         of recent bleeding. Biopsied.                        - Erythematous mucosa in the gastric body. Biopsied.                        - Esophagogastric landmarks identified.                        - White nummular lesions in esophageal mucosa.                         Biopsied. Recommendation:        - Await pathology results.                        - Return patient to hospital ward for possible                         discharge same day.                        - Advance diet as tolerated today.                        - Continue present medications.                        -  Resume Xarelto (rivaroxaban) at prior dose today.                         Refer to managing physician for further adjustment of                         therapy.                        - Use a proton pump inhibitor PO BID for 3 months.                        - Return to GI clinic in 2 months. Procedure Code(s):     --- Professional ---                        [phone], Esophagogastroduodenoscopy, flexible,                         transoral; with biopsy, single or multiple Diagnosis Code(s):     --- Professional ---                        K31.89, Other diseases of stomach and duodenum                        K22.8, Other specified diseases of esophagus                        R13.14, Dysphagia, pharyngoesophageal  phase CPT copyright 2019 American Medical Association. All rights reserved. The codes documented in this report are preliminary and upon coder review may  be revised to meet current compliance requirements. Dr. Ulyess Mort Lin Landsman MD, MD 02/27/2020 12:14:26 PM This report has been signed electronically. Number of Addenda: 0 Note Initiated On: 02/27/2020 11:41 AM Estimated Blood Loss:  Estimated blood loss: none.      [facility]

## 2020-02-27 NOTE — Anesthesia Preprocedure Evaluation (Signed)
Anesthesia Evaluation  Patient identified by MRN, date of birth, ID band Patient awake  General Assessment Comment:Patient with dysphagia. Denies nausea or vomiting. NPO since before midnight.  Reviewed: Allergy & Precautions, NPO status , Patient's Chart, lab work & pertinent test results  History of Anesthesia Complications Negative for: history of anesthetic complications  Airway Mallampati: II  TM Distance: >3 FB Neck ROM: Full   Comment: Large beard Dental no notable dental hx. (+) Teeth Intact   Pulmonary shortness of breath and with exertion, neg sleep apnea, neg COPD, Patient abstained from smoking.Not current smoker,  Shortness of breath since covid infection 02/2019   Pulmonary exam normal breath sounds clear to auscultation       Cardiovascular Exercise Tolerance: Good METShypertension, Pt. on medications +CHF  (-) CAD and (-) Past MI + dysrhythmias Atrial Fibrillation  Rhythm:Irregular Rate:Normal - Systolic murmurs Recent PE  TTE 2021: 1. Left ventricular ejection fraction, by estimation, is 35 to 40%. The  left ventricle has moderately decreased function. The left ventricle  demonstrates global hypokinesis. There is mild left ventricular  hypertrophy. Left ventricular diastolic  parameters are consistent with Grade I diastolic dysfunction (impaired  relaxation).  2. Right ventricular systolic function is mildly reduced. The right  ventricular size is moderately enlarged.  3. Left atrial size was mildly dilated.  4. Right atrial size was mild to moderately dilated.  5. The mitral valve is grossly normal. No evidence of mitral valve  regurgitation.  6. The aortic valve is tricuspid. Aortic valve regurgitation is trivial.  7. The inferior vena cava is normal in size with greater than 50%  respiratory variability, suggesting right atrial pressure of 3 mmHg.    Neuro/Psych negative neurological ROS   negative psych ROS   GI/Hepatic neg GERD  ,(+)     (-) substance abuse  ,   Endo/Other  neg diabetes  Renal/GU negative Renal ROS     Musculoskeletal   Abdominal   Peds  Hematology   Anesthesia Other Findings Past Medical History: No date: Alcohol abuse No date: Cardiomyopathy [facility])     Comment:  a. 05/2019 Echo: EF 35-40%, gr1 DD. Mod enlarged RV.               Mildly dil LA. Mild to mod dil RA. Triv AI. No date: Chronic pain No date: COVID-19     Comment:  a. 02/2019 No date: Persistent atrial fibrillation (HCC) No date: Pulmonary embolism ([place])     Comment:  a. 05/2019 CTA Chest: mild amt of PE w/in a lower lobe               branch of RPA.  Reproductive/Obstetrics                             Anesthesia Physical  Anesthesia Plan  ASA: III  Anesthesia Plan: General   Post-op Pain Management:    Induction: Intravenous  PONV Risk Score and Plan: 2 and Ondansetron, Propofol infusion and TIVA  Airway Management Planned: Nasal Cannula  Additional Equipment: None  Intra-op Plan:   Post-operative Plan:   Informed Consent: I have reviewed the patients History and Physical, chart, labs and discussed the procedure including the risks, benefits and alternatives for the proposed anesthesia with the patient or authorized representative who has indicated his/her understanding and acceptance.     Dental advisory given  Plan Discussed with: CRNA and Surgeon  Anesthesia Plan Comments: (Discussed  risks of anesthesia with patient, including possibility of difficulty with spontaneous ventilation under anesthesia necessitating airway intervention, PONV, and rare risks such as cardiac or respiratory or neurological events. Patient understands.)        Anesthesia Quick Evaluation

## 2020-02-27 NOTE — Discharge Summary (Signed)
Calvin Esparza:[id number] DOB: 1960-12-23 DOA: 02/25/2020  PCP: [name], MD  Admit date: 02/25/2020 Discharge date: 02/27/2020  Time spent: 35 minutes  Recommendations for Outpatient Follow-up:  1. Outpatient cardiology f/u and f/u at [facility] clinic 2. Will need check of kidney function and electrolytes in approximately 1 week  3. Outpatient f/u with GI in 2 months    Discharge Diagnoses:  Principal Problem:   Rapid atrial fibrillation [facility]) Active Problems:   Alcohol abuse   Pulmonary embolism (HCC)   NICM (nonischemic cardiomyopathy) ([place])   Acute on chronic combined systolic (congestive) and diastolic (congestive) heart failure ([place])   Discharge Condition: good  Diet recommendation: heart healthy  Filed Weights   02/25/20 0526 02/26/20 0348 02/27/20 0538  Weight: 88.5 kg 89 kg 88.4 kg    History of present illness:  Calvin Esparza a 59 y.o.malewith medical history significant forchronic combined systolic and diastolic dysfunction CHF, history of A. fib, history of pulmonary embolism on anticoagulation, hypertension and alcohol abuse who presents to the emergency room for evaluation of worsening shortness of breath and palpitations. Patient states that he has had shortness of breath since he was diagnosed with Covid almost a year ago but over the last couple of days his symptoms have gotten worse with any form of exertion he has to stop to catch his breath. He also complains of increased abdominal girth and difficulty swallowing with early satiety. He complains of occasional chest tightness, constipation and palpitations but has no lower extremity swelling, no cough, no fever, no chills, no urinary symptoms, no dizziness or lightheadedness.  Hospital Course:  # Atrial fibrillation with rapid ventricular rate # Acute hypoxic respiratory failure Hx of such, symptomatic in setting of not taking his home dilt/metop. Now off dilt gtt, home dilt/metop  resumed, rate is regular, weaned off o2 - cont home metop succ 100 bid, diltiazem 180 qd - cont home xarelto - f/u cardiology and in chf clinic  # Acute on chronic combined systolic and diastolic dysfunction CHF Patient's last 2D echocardiogram in March showed an LVEF of 35 to 40% - decrease home torsemide to 20 qd (slight bump in cr day of discharge) - cont metop; losartan added-back  # History of pulmonary embolism Compliant w/ home xarelto  # Dysphagia 1 month of progressive dysphagia to solids and liquids w/ regurgitation. EGD performed, multiple gastric erosions, mid-esophageal white lesion, and portal gastropathy seen.  - add ppi bid - f/u gi 2 months  # Alcohol use disorder # Cirrhosis Heavy drinker 5-6 drinks a day. New dx cirrhosis based on u/s 12/11. No ascites. EGD w/o varices - outpt gi f/u as above - etoh cessation strongly advised3  Procedures:  egd   Consultations:  Cardiology, GI  Discharge Exam: Vitals:   02/27/20 1232 02/27/20 1242  BP: 105/71 123/90  Pulse: 67 73  Resp: 17 17  Temp:    SpO2: 98% 94%    General exam: Appears calm and comfortable  Respiratory system: Clear to auscultation. Respiratory effort normal. Cardiovascular system: S1 & S2 heard, irreg irreg. No JVD, murmurs, rubs, gallops or clicks. No pedal edema. Gastrointestinal system: Abdomen is nondistended, soft and nontender. No organomegaly or masses felt. Normal bowel sounds heard. Central nervous system: Alert and oriented. No focal neurological deficits. Extremities: Symmetric 5 x 5 power. Skin: No rashes, lesions or ulcers Psychiatry: Judgement and insight appear normal. Mood & affect appropriate.   Discharge Instructions   Discharge Instructions    Diet -  low sodium heart healthy   Complete by: As directed    Increase activity slowly   Complete by: As directed      Allergies as of 02/27/2020   No Known Allergies     Medication List    TAKE these medications    colchicine-probenecid 0.5-500 MG tablet Take 1 tablet by mouth 2 (two) times daily as needed (joint pain).   diltiazem 180 MG 24 hr capsule Commonly known as: CARDIZEM CD Take 180 mg by mouth daily.   losartan 25 MG tablet Commonly known as: COZAAR Take 1 tablet (25 mg total) by mouth daily. Start taking on: February 28, 2020   metoprolol succinate 50 MG 24 hr tablet Commonly known as: TOPROL-XL Take 1 tablet (50 mg total) by mouth in the morning and at bedtime. Take with or immediately following a meal. What changed: additional instructions   metoprolol succinate 100 MG 24 hr tablet Commonly known as: TOPROL-XL Take 1 tablet (100 mg total) by mouth in the morning and at bedtime. Take with or immediately following a meal. What changed: Another medication with the same name was changed. Make sure you understand how and when to take each.   omeprazole 20 MG capsule Commonly known as: PriLOSEC Take 1 capsule (20 mg total) by mouth 2 (two) times daily before a meal.   potassium chloride SA 20 MEQ tablet Commonly known as: KLOR-CON Take 1 tablet (20 mEq total) by mouth daily.   torsemide 20 MG tablet Commonly known as: DEMADEX Take 2 tablets (40 mg total) by mouth daily.   Xarelto 20 MG Tabs tablet Generic drug: rivaroxaban Take 1 tablet (20 mg total) by mouth daily with supper.      No Known Allergies  Follow-up Information    [facility] Follow up on 03/07/2020.   Specialty: Cardiology Why: at 2:00pm. Enter through the [facility] entrance Contact information: [place] [phone]       Minna Merritts, MD. Schedule an appointment as soon as possible for a visit.   Specialty: Cardiology Contact information: [place] [address] [phone]                The results of significant diagnostics from this hospitalization  (including imaging, microbiology, ancillary and laboratory) are listed below for reference.    Significant Diagnostic Studies: DG Chest Port 1 View  Result Date: 02/25/2020 CLINICAL DATA:  Shortness of breath EXAM: PORTABLE CHEST 1 VIEW COMPARISON:  11/28/2019 and prior FINDINGS: Mild hypoinflation. No focal consolidation, pneumothorax or pleural effusion. Stable cardiomegaly. Multilevel spondylosis and chronic right rib deformities. IMPRESSION: No focal consolidation. Stable cardiomegaly. Electronically Signed   By: Primitivo Gauze M.D.   On: 02/25/2020 06:01   US Abdomen Limited RUQ (LIVER/GB)  Result Date: 02/25/2020 CLINICAL DATA:  Abdominal distension, history of alcohol abuse. EXAM: ULTRASOUND ABDOMEN LIMITED RIGHT UPPER QUADRANT COMPARISON:  CT abdomen pelvis 09/10/2016 FINDINGS: Gallbladder: Decompressed with diffusely thickened wall measuring up to 0.6 cm. Trace pericholecystic fluid. No gallstones identified. Negative sonographic Murphy sign. Common bile duct: Diameter: 0.5 cm, within normal limits. Liver: Nodular contour with heterogeneous echotexture. No focal lesion identified. Portal vein is patent on color Doppler imaging with normal direction of blood flow towards the liver. Other: No ascites visualized. IMPRESSION: 1. Diffuse mild gallbladder wall thickening which is a nonspecific finding and can be seen in the setting of gallbladder inflammation or  secondary to cirrhosis, congestive heart failure, hepatitis, among others. No gallstones identified. 2.  Cirrhotic changes in the liver.  No focal lesion identified. 3.  No ascites. Electronically Signed   By: Audie Pinto M.D.   On: 02/25/2020 11:39    Microbiology: Recent Results (from the past 240 hour(s))  Resp Panel by RT-PCR (Flu A&B, Covid) Nasopharyngeal Swab     Status: None   Collection Time: 02/25/20  5:37 AM   Specimen: Nasopharyngeal Swab; Nasopharyngeal(NP) swabs in vial transport medium  Result Value Ref Range  Status   SARS Coronavirus 2 by RT PCR NEGATIVE NEGATIVE Final    Comment: (NOTE) SARS-CoV-2 target nucleic acids are NOT DETECTED.  The SARS-CoV-2 RNA is generally detectable in upper respiratory specimens during the acute phase of infection. The lowest concentration of SARS-CoV-2 viral copies this assay can detect is 138 copies/mL. A negative result does not preclude SARS-Cov-2 infection and should not be used as the sole basis for treatment or other patient management decisions. A negative result may occur with  improper specimen collection/handling, submission of specimen other than nasopharyngeal swab, presence of viral mutation(s) within the areas targeted by this assay, and inadequate number of viral copies(<138 copies/mL). A negative result must be combined with clinical observations, patient history, and epidemiological information. The expected result is Negative.  Fact Sheet for Patients:  EntrepreneurPulse.com.au  Fact Sheet for Healthcare Providers:  IncredibleEmployment.be  This test is no t yet approved or cleared by the [place] FDA and  has been authorized for detection and/or diagnosis of SARS-CoV-2 by FDA under an Emergency Use Authorization (EUA). This EUA will remain  in effect (meaning this test can be used) for the duration of the COVID-19 declaration under Section 564(b)(1) of the Act, 21 U.S.C.section 360bbb-3(b)(1), unless the authorization is terminated  or revoked sooner.       Influenza A by PCR NEGATIVE NEGATIVE Final   Influenza B by PCR NEGATIVE NEGATIVE Final    Comment: (NOTE) The Xpert Xpress SARS-CoV-2/FLU/RSV plus assay is intended as an aid in the diagnosis of influenza from Nasopharyngeal swab specimens and should not be used as a sole basis for treatment. Nasal washings and aspirates are unacceptable for Xpert Xpress SARS-CoV-2/FLU/RSV testing.  Fact Sheet for  Patients: EntrepreneurPulse.com.au  Fact Sheet for Healthcare Providers: IncredibleEmployment.be  This test is not yet approved or cleared by the [place] FDA and has been authorized for detection and/or diagnosis of SARS-CoV-2 by FDA under an Emergency Use Authorization (EUA). This EUA will remain in effect (meaning this test can be used) for the duration of the COVID-19 declaration under Section 564(b)(1) of the Act, 21 U.S.C. section 360bbb-3(b)(1), unless the authorization is terminated or revoked.  Performed at [facility], [address] [place], [place] [address]      Labs: Basic Metabolic Panel: Recent Labs  Lab 02/25/20 0535 02/26/20 0629 02/27/20 0612  NA 138 137 139  K 4.3 4.6 4.2  CL 101 100 102  CO2 22 27 26   GLUCOSE 110* 132* 116*  BUN 16 17 21*  CREATININE 1.24 1.18 1.30*  CALCIUM 9.5 9.5 9.3   Liver Function Tests: Recent Labs  Lab 02/25/20 0535  AST 45*  ALT 37  ALKPHOS 67  BILITOT 1.4*  PROT 7.6  ALBUMIN 4.4   No results for input(s): LIPASE, AMYLASE in the last 168 hours. No results for input(s): AMMONIA in the last 168 hours. CBC: Recent Labs  Lab 02/25/20 0535  WBC 6.1  NEUTROABS 3.6  HGB 14.4  HCT 43.7  MCV 98.6  PLT 182   Cardiac Enzymes: No results for input(s): CKTOTAL, CKMB, CKMBINDEX, TROPONINI in the last 168 hours. BNP: BNP (last 3 results) Recent Labs    10/10/19 1630 11/28/19 1403 02/25/20 0537  BNP 1,007.1* 1,033.4* 815.5*    ProBNP (last 3 results) No results for input(s): PROBNP in the last 8760 hours.  CBG: No results for input(s): GLUCAP in the last 168 hours.     Signed:  Desma Maxim MD.  [facility] 02/27/2020, 3:03 PM

## 2020-02-28 ENCOUNTER — Encounter: Payer: Self-pay | Admitting: Gastroenterology

## 2020-02-28 LAB — SURGICAL PATHOLOGY

## 2020-02-29 ENCOUNTER — Telehealth: Payer: Self-pay | Admitting: Nurse Practitioner

## 2020-02-29 NOTE — Telephone Encounter (Signed)
Patient is scheduled for 12/16, per Christell Faith, PA, patient needs 14 day hosptial follow up. Appointment for tomorrow is too soon. Left message on voice mail for patient to call and move appointment out .

## 2020-03-01 ENCOUNTER — Encounter: Payer: Self-pay | Admitting: Nurse Practitioner

## 2020-03-01 ENCOUNTER — Other Ambulatory Visit: Payer: Self-pay

## 2020-03-01 ENCOUNTER — Telehealth: Payer: Self-pay

## 2020-03-01 ENCOUNTER — Ambulatory Visit (INDEPENDENT_AMBULATORY_CARE_PROVIDER_SITE_OTHER): Payer: 59 | Admitting: Nurse Practitioner

## 2020-03-01 VITALS — BP 108/70 | HR 98 | Ht 70.0 in | Wt 191.0 lb

## 2020-03-01 DIAGNOSIS — I5020 Unspecified systolic (congestive) heart failure: Secondary | ICD-10-CM

## 2020-03-01 DIAGNOSIS — I4819 Other persistent atrial fibrillation: Secondary | ICD-10-CM | POA: Diagnosis not present

## 2020-03-01 DIAGNOSIS — Z86711 Personal history of pulmonary embolism: Secondary | ICD-10-CM | POA: Diagnosis not present

## 2020-03-01 DIAGNOSIS — R1310 Dysphagia, unspecified: Secondary | ICD-10-CM

## 2020-03-01 DIAGNOSIS — Z91148 Patient's other noncompliance with medication regimen for other reason: Secondary | ICD-10-CM

## 2020-03-01 DIAGNOSIS — I428 Other cardiomyopathies: Secondary | ICD-10-CM

## 2020-03-01 DIAGNOSIS — F1010 Alcohol abuse, uncomplicated: Secondary | ICD-10-CM

## 2020-03-01 DIAGNOSIS — Z9114 Patient's other noncompliance with medication regimen: Secondary | ICD-10-CM

## 2020-03-01 NOTE — Telephone Encounter (Signed)
Placed EGD for recall list

## 2020-03-01 NOTE — Patient Instructions (Signed)
Medication Instructions:  Your physician recommends that you continue on your current medications as directed. Please refer to the Current Medication list given to you today.  *If you need a refill on your cardiac medications before your next appointment, please call your pharmacy*   Lab Work: Your physician recommends that you have lab work today: Bmet  If you have labs (blood work) drawn today and your tests are completely normal, you will receive your results only by: Marland Kitchen MyChart Message (if you have MyChart) OR . A paper copy in the mail If you have any lab test that is abnormal or we need to change your treatment, we will call you to review the results.   Testing/Procedures: None ordered   Follow-Up: At [facility], you and your health needs are our priority.  As part of our continuing mission to provide you with exceptional heart care, we have created designated Provider Care Teams.  These Care Teams include your primary Cardiologist (physician) and Advanced Practice Providers (APPs -  Physician Assistants and Nurse Practitioners) who all work together to provide you with the care you need, when you need it.  We recommend signing up for the patient portal called "MyChart".  Sign up information is provided on this After Visit Summary.  MyChart is used to connect with patients for Virtual Visits (Telemedicine).  Patients are able to view lab/test results, encounter notes, upcoming appointments, etc.  Non-urgent messages can be sent to your provider as well.   To learn more about what you can do with MyChart, go to [email or web address].    Your next appointment:   6 week(s)  The format for your next appointment:   In Person  Provider:   You may see Ida Rogue, MD or one of the following Advanced Practice Providers on your designated Care Team:    Murray Hodgkins, NP

## 2020-03-01 NOTE — Telephone Encounter (Signed)
-----   Message from Lin Landsman, MD sent at 03/01/2020  3:40 PM EST ----- He will need repeat EGD for esophageal lesion in 3 months. Will discuss with him during 04/2019 follow up visit  RV

## 2020-03-01 NOTE — Progress Notes (Signed)
Office Visit    Patient Name: Calvin Esparza Date of Encounter: 03/01/2020  Primary Care Provider:  [name], MD Primary Cardiologist:  Ida Rogue, MD  Chief Complaint    60 year old male with a history of persistent atrial fibrillation status post prior cardioversion in May 2021 with subsequent reversion to A. fib, alcohol abuse, chronic pain, nonischemic cardiomyopathy/HFrEF (EF 35-40% March 2021), PE (March 2021 and again June 2021), and COVID-19 infection (December 2020), who presents for follow-up after recent hospitalization related to volume overload and rapid A. fib.  Past Medical History    Past Medical History:  Diagnosis Date  . Alcohol abuse   . Chronic pain   . COVID-19    a. 02/2019  . HFrEF (heart failure with reduced ejection fraction) ([place])    a. 05/2019 Echo: EF 35-40%.  . History of medication noncompliance   . Longstanding persistent atrial fibrillation ([place])    a. Dx 03/2018; b. 05/2019 recurrent Afib in setting of PE. CHA2DS2VASc = 1-->Eliquis later changed to xarelto; b. 07/2019 s/p DCCV (150J); d. 08/2019 admit w/ AF RVR in settting of noncompliance - longstanding persistent afib since on bb/ccb rx.  Marland Kitchen NICM (nonischemic cardiomyopathy) ([place])    a. 05/2019 Echo: EF 35-40%, gr1 DD. Mod enlarged RV. Mildly dil LA. Mild to mod dil RA. Triv AI; b. 05/2019 MV: EF 32%, no ischemia.  . Pulmonary embolism ([place])    a. 05/2019 CTA Chest: mild amt of PE w/in a lower lobe branch of RPA; b. 08/2019 CTA Chest: Small PE in RML and RLL PA.   Past Surgical History:  Procedure Laterality Date  . CARDIOVERSION N/A 07/29/2019   Procedure: CARDIOVERSION;  Surgeon: Minna Merritts, MD;  Location: [facility] ORS;  Service: Cardiovascular;  Laterality: N/A;  . ESOPHAGOGASTRODUODENOSCOPY (EGD) WITH PROPOFOL N/A 02/27/2020   Procedure: ESOPHAGOGASTRODUODENOSCOPY (EGD) WITH PROPOFOL;  Surgeon: Lin Landsman, MD;  Location: [facility] ENDOSCOPY;  Service: Gastroenterology;   Laterality: N/A;    Allergies  No Known Allergies  History of Present Illness    59 year old male with the above complex past medical history including persistent atrial fibrillation status post cardioversion May 2021, alcohol abuse, chronic pain, nonischemic cardiomyopathy, HFrEF, pulmonary embolism in March and again in June 2021, and COVID-19 infection.  He was previously diagnosed with atrial fibrillation in January 2020, when he presented with chest pain and dizziness was found to be in A. fib.  He was placed on beta-blocker and Xarelto therapy and subsequently followed up with Dr. Rockey Situ in July 2020.  He was intermittently compliant.  In March 2021, he presented to the emergency department with chest tightness and dyspnea and was found to be in atrial fibrillation with rapid ventricular spots.  CT angiogram showed mild amount of pulmonary embolism within a lower branch of the right pulmonary artery.  Echo showed an EF of 35-40% with grade 1 diastolic dysfunctio and moderately enlarged right heart chambers.  Stress testing was undertaken and was nonischemic.  He was discharged home on Eliquis with initial plan for rate control.  He was subsequently switched from Eliquis to Xarelto to improve compliance and underwent cardioversion in May 2021.  He was readmitted June 2021 in the setting of dyspnea recurrent A. fib.  CT of the chest showed small pulmonary emboli in the right middle and lower lobe pulmonary arteries.  Recurrent PE was felt to be secondary to noncompliance.  He was readmitted in July with A. fib and presyncope in the setting  of dehydration and acute kidney injury.  Since then, focus has been on rate control with calcium channel blocker and beta-blocker therapy.  He has been anticoagulated with Xarelto.  He presented to the emergency department on December 11 with progressive dyspnea on exertion, increasing abdominal girth, lower extremity edema, and orthopnea.  He was found to be in A.  fib with RVR at a rate of 177.  He reported dysphagia over a period of several weeks to a month and in that setting, had not been taking any of his pills except Xarelto.  He was admitted by the hospitalist service and home medications were resumed.  With resumption of home medications and IV diuresis, he had significant improvement in symptoms.  He was seen by GI and underwent EGD which showed multiple gastric erosions, mid esophageal white lesion, and portal gastropathy.  No varices were noted.  Twice daily PPI therapy was added with recommendation for outpatient GI follow-up.  Since discharge, he has felt well.  He has been weighing daily and notes good response to torsemide therapy.  He has not had any dyspnea and denies any increase in abdominal girth, chest pain, capitation's, PND, orthopnea, dizziness, syncope, edema, or early satiety.  He has not had a drink since hospitalization.  Home Medications    Prior to Admission medications   Medication Sig Start Date End Date Taking? Authorizing Provider  colchicine-probenecid 0.5-500 MG tablet Take 1 tablet by mouth 2 (two) times daily as needed (joint pain). 09/26/19  Yes [provider]  diltiazem (CARDIZEM CD) 180 MG 24 hr capsule Take 180 mg by mouth daily.   Yes [provider]  losartan (COZAAR) 25 MG tablet Take 1 tablet (25 mg total) by mouth daily. 02/28/20  Yes Wouk, Ailene Rud, MD  metoprolol succinate (TOPROL-XL) 100 MG 24 hr tablet Take 1 tablet (100 mg total) by mouth in the morning and at bedtime. Take with or immediately following a meal. 10/11/19  Yes Mariel Aloe, MD  omeprazole (PRILOSEC) 20 MG capsule Take 1 capsule (20 mg total) by mouth 2 (two) times daily before a meal. 02/27/20 02/26/21 Yes Wouk, Ailene Rud, MD  potassium chloride SA (KLOR-CON) 20 MEQ tablet Take 1 tablet (20 mEq total) by mouth daily. 12/05/19  Yes Minna Merritts, MD  torsemide (DEMADEX) 20 MG tablet Take 2 tablets (40 mg total) by mouth  daily. 02/27/20  Yes Wouk, Ailene Rud, MD  XARELTO 20 MG TABS tablet Take 1 tablet (20 mg total) by mouth daily with supper. 12/05/19  Yes Minna Merritts, MD    Review of Systems    Feeling well since discharge.  He denies chest pain, palpitations, dyspnea, pnd, orthopnea, n, v, dizziness, syncope, edema, weight gain, or early satiety.  All other systems reviewed and are otherwise negative except as noted above.  Physical Exam    VS:  BP 108/70 (BP Location: Left Arm, Patient Position: Sitting, Cuff Size: Normal)   Pulse 98   Ht 5\' 10"  (1.778 m)   Wt 191 lb (86.6 kg)   SpO2 95%   BMI 27.41 kg/m  , BMI Body mass index is 27.41 kg/m. GEN: Well nourished, well developed, in no acute distress. HEENT: normal. Neck: Supple, no JVD, carotid bruits, or masses. Cardiac: Irregularly irregular, no murmurs, rubs, or gallops. No clubbing, cyanosis, edema.  Radials 2+ and equal bilaterally.  Respiratory:  Respirations regular and unlabored, clear to auscultation bilaterally. GI: Soft, nontender, nondistended, BS + x 4. MS: no  deformity or atrophy. Skin: warm and dry, no rash. Neuro:  Strength and sensation are intact. Psych: Normal affect.  Accessory Clinical Findings    ECG personally reviewed by me today -atrial fibrillation, 98, anterolateral T wave now present.  Lab Results  Component Value Date   WBC 6.1 02/25/2020   HGB 14.4 02/25/2020   HCT 43.7 02/25/2020   MCV 98.6 02/25/2020   PLT 182 02/25/2020   Lab Results  Component Value Date   CREATININE 1.30 (H) 02/27/2020   BUN 21 (H) 02/27/2020   NA 139 02/27/2020   K 4.2 02/27/2020   CL 102 02/27/2020   CO2 26 02/27/2020   Lab Results  Component Value Date   ALT 37 02/25/2020   AST 45 (H) 02/25/2020   ALKPHOS 67 02/25/2020   BILITOT 1.4 (H) 02/25/2020    Assessment & Plan    1.  Persistent atrial fibrillation: Recently hospitalized in the setting of worsening heart failure symptoms and rapid atrial fibrillation  after a 2 to 4-week history of noncompliance with medications secondary to dysphagia.  Rates were easily controlled with home medications were resumed and he is reasonably controlled today at 98 bpm.  Remains on Toprol-XL 100 mg twice daily as well as diltiazem CD 180 mg daily.  We do recognize that calcium channel blocker therapy is not ideal in the setting of cardiomyopathy however, this has been instrumental in his rate control and when rate controlled, heart failure symptoms improved significantly.  He reports compliance with Xarelto therapy in the setting of CHA2DS2VASc 2.  Stressed the importance of remaining off of alcohol.  2.  Dysphagia/gastric erosions: Dysphagia has resolved since starting PPI therapy and he is now able to more easily swallow his pills.  He will follow up with GI as an outpatient.  3.  Nonischemic cardiomyopathy/chronic heart failure with reduced ejection fraction: EF 35 to 40% by echo in March 2021.  Euvolemic on examination in the setting of good rate control for A. Fib. We discussed the importance of daily weights, sodium restriction, medication compliance, and symptom reporting and he verbalizes understanding.  He remains beta-blocker and ARB therapy.  Blood pressure on the soft side, thus would not transition to Entresto or add spironolactone at this time.  I will follow-up a basic metabolic panel today as he is now taking torsemide 40 mg daily and notes significant urine output.  4.  History of PE/DVT: Reports compliance with Xarelto.  5.  Alcohol abuse: Says he is no longer drinking.  I congratulated him on this and encouraged him to remain off of alcohol.  We discussed the role alcohol plays in his A. fib and heart failure, as well as portal gastropathy noted on EGD.  6.  Medication nonadherence: He reports compliance since hospitalization.  Stressed importance of ongoing compliance going forward.  7.  Disposition: Follow-up basic metabolic panel today.  Follow-up in  clinic in 4 to 6 weeks to assess stability.   Murray Hodgkins, NP 03/01/2020, 4:12 PM

## 2020-03-02 ENCOUNTER — Telehealth: Payer: Self-pay | Admitting: *Deleted

## 2020-03-02 LAB — BASIC METABOLIC PANEL
BUN/Creatinine Ratio: 12 (ref 9–20)
BUN: 15 mg/dL (ref 6–24)
CO2: 27 mmol/L (ref 20–29)
Calcium: 9.8 mg/dL (ref 8.7–10.2)
Chloride: 102 mmol/L (ref 96–106)
Creatinine, Ser: 1.25 mg/dL (ref 0.76–1.27)
GFR calc Af Amer: 73 mL/min/{1.73_m2} (ref >59)
GFR calc non Af Amer: 63 mL/min/{1.73_m2} (ref >59)
Glucose: 89 mg/dL (ref 65–99)
Potassium: 4.8 mmol/L (ref 3.5–5.2)
Sodium: 142 mmol/L (ref 134–144)

## 2020-03-02 NOTE — Telephone Encounter (Signed)
-----   Message from Theora Gianotti, NP sent at 03/02/2020  7:42 AM EST ----- Renal fxn/lytes wnl. Cont current meds.

## 2020-03-02 NOTE — Telephone Encounter (Signed)
Left voicemail message to call back for results.  

## 2020-03-02 NOTE — Telephone Encounter (Signed)
Reviewed results and recommendations with patient and he verbalized understanding with no further questions at this time.  

## 2020-03-02 NOTE — Addendum Note (Signed)
Addended by: Janan Ridge on: 03/02/2020 04:09 PM   Modules accepted: Orders

## 2020-03-05 NOTE — Progress Notes (Signed)
Pt received COVID-19 vaccine on December 13 th, 2021 per MD order. This RN observed patient for 15-minutes post vaccine,no signs or symptoms of adverse reaction noted after completing the 15-minute observation period.Reviewed post-visit instruction sheet with pt.

## 2020-03-07 ENCOUNTER — Ambulatory Visit: Payer: 59 | Admitting: Family

## 2020-03-07 NOTE — Progress Notes (Deleted)
Patient ID: Calvin Esparza, male    DOB: 07/04/1960, 59 y.o.   MRN: [id number]  HPI  Mr Bazen is a 59 y/o male with a history of atrial fibrillation, PE, alcohol use, tobacco use (chewing tobacco) and chronic heart failure.   Echo report from 06/08/19 reviewed and showed an EF of 35-40% along with mild LVH and mild LAE.   Admitted 02/25/20 due to AF with RVR and acute on chronic HF. Cardiology and GI consults obtained. Placed on diltiazem drip with transition to oral medications. Home torsemide dose decreased due to creatinine bump. EGD performed due to dysphagia. Discharged after 2 days. Was in the ED 11/28/19 due to shortness of breath. Has been not taking diltiazem on days when he takes colchicine. Given dose of IV lasix and diltiazem with good urine output so he was released. Was in the ED 10/10/19 due to weakness, palpitations and near syncope while at work. EKG showed AF with RVR along with AKI. IVF given and cardiology consulted. Initially placed on cardizem drip and then transitioned to oral metoprolol. Released the following day. Admitted 09/08/19 due to shortness of breath and leg pain. Cardiology consult obtained. Had not been taking his xarelto. Initially placed on cardizem and heparin drips and then transitioned to oral medications. Previous cardioversion ~ 1 month ago. Chest CT showed continued PE and leg ultrasound showed DVT throughout entire left thigh. Placed back on xarelto. Discharged after 2 days.    He presents today for a follow-up visit with a chief complaint of    Past Medical History:  Diagnosis Date  . Alcohol abuse   . Chronic pain   . COVID-19    a. 02/2019  . HFrEF (heart failure with reduced ejection fraction) ([place])    a. 05/2019 Echo: EF 35-40%.  . History of medication noncompliance   . Longstanding persistent atrial fibrillation ([place])    a. Dx 03/2018; b. 05/2019 recurrent Afib in setting of PE. CHA2DS2VASc = 1-->Eliquis later changed to xarelto; b. 07/2019 s/p  DCCV (150J); d. 08/2019 admit w/ AF RVR in settting of noncompliance - longstanding persistent afib since on bb/ccb rx.  Marland Kitchen NICM (nonischemic cardiomyopathy) ([place])    a. 05/2019 Echo: EF 35-40%, gr1 DD. Mod enlarged RV. Mildly dil LA. Mild to mod dil RA. Triv AI; b. 05/2019 MV: EF 32%, no ischemia.  . Pulmonary embolism ([place])    a. 05/2019 CTA Chest: mild amt of PE w/in a lower lobe branch of RPA; b. 08/2019 CTA Chest: Small PE in RML and RLL PA.   Past Surgical History:  Procedure Laterality Date  . CARDIOVERSION N/A 07/29/2019   Procedure: CARDIOVERSION;  Surgeon: Minna Merritts, MD;  Location: [facility] ORS;  Service: Cardiovascular;  Laterality: N/A;  . ESOPHAGOGASTRODUODENOSCOPY (EGD) WITH PROPOFOL N/A 02/27/2020   Procedure: ESOPHAGOGASTRODUODENOSCOPY (EGD) WITH PROPOFOL;  Surgeon: Lin Landsman, MD;  Location: [facility];  Service: Gastroenterology;  Laterality: N/A;   Family History  Problem Relation Age of Onset  . Bone cancer Father   . Stroke Father   . Diabetes Sister    Social History   Tobacco Use  . Smoking status: Never Smoker  . Smokeless tobacco: Current User    Types: Chew  Substance Use Topics  . Alcohol use: Yes    Alcohol/week: 24.0 standard drinks    Types: 24 Cans of beer per week    Comment: 6 beers daily, weekend drinks more-   No Known Allergies    Review of  Systems  Constitutional: Negative for appetite change and fatigue.  HENT: Negative for congestion, postnasal drip and sore throat.   Eyes: Negative.   Respiratory: Positive for shortness of breath. Negative for cough and chest tightness.   Cardiovascular: Negative for chest pain, palpitations and leg swelling.  Gastrointestinal: Positive for abdominal distention. Negative for abdominal pain.  Endocrine: Negative.   Genitourinary: Negative.   Musculoskeletal: Positive for back pain. Negative for neck pain.  Skin: Negative.   Allergic/Immunologic: Negative.   Neurological: Negative for  dizziness and light-headedness.  Hematological: Negative for adenopathy. Does not bruise/bleed easily.  Psychiatric/Behavioral: Negative for dysphoric mood and sleep disturbance (sleeping on 2 pillows). The patient is nervous/anxious.      Physical Exam Vitals and nursing note reviewed.  Constitutional:      Appearance: He is well-developed.  HENT:     Head: Normocephalic and atraumatic.  Neck:     Vascular: No JVD.  Cardiovascular:     Rate and Rhythm: Normal rate. Rhythm irregular.  Pulmonary:     Effort: Pulmonary effort is normal. No respiratory distress.     Breath sounds: No wheezing or rales.  Abdominal:     General: There is distension.     Palpations: Abdomen is soft.     Tenderness: There is no abdominal tenderness.  Musculoskeletal:     Cervical back: Normal range of motion and neck supple.     Right lower leg: No tenderness. No edema.     Left lower leg: No tenderness. No edema.  Skin:    General: Skin is warm and dry.  Neurological:     General: No focal deficit present.     Mental Status: He is alert and oriented to person, place, and time.  Psychiatric:        Mood and Affect: Mood is anxious.        Behavior: Behavior normal.    Assessment & Plan:  1: Chronic heart failure with reduced ejection fraction- - NYHA class II - euvolemic today - weighing daily; reminded to call for an overnight weight gain of >2 pounds or a weekly weight gain of >5 pounds - weight 203.6 pounds from last visit here 3 months ago - not adding salt to his food and tries to be aware of his sodium intake - encouraged him to decrease fluid consumption down to 64 ounces/ day unless he's sweating a lot - saw cardiology Sharolyn Douglas) 03/01/20 - BNP 02/25/20 was 815.5  2: Atrial fibrillation/ PE- - saw PCP Ouida Sills) 09/26/19 - BMP 03/01/20 reviewed and showed sodium 142, potassium 4.8, creatinine 1.25 and GFR 63  3: Alcohol use- - + chews tobacco - drinks 3-4 beers daily (12 ounces  each)   Medication list reviewed.

## 2020-03-20 ENCOUNTER — Ambulatory Visit: Payer: 59 | Admitting: Family

## 2020-03-20 ENCOUNTER — Telehealth: Payer: Self-pay | Admitting: Family

## 2020-03-20 NOTE — Telephone Encounter (Signed)
Patient did not show for his Heart Failure [facility] appointment on 03/20/20. Will attempt to reschedule.

## 2020-04-13 ENCOUNTER — Ambulatory Visit: Payer: 59 | Admitting: Cardiovascular Disease

## 2020-04-23 ENCOUNTER — Ambulatory Visit: Payer: 59 | Admitting: Physician Assistant

## 2020-04-23 NOTE — Progress Notes (Deleted)
Patient ID: Calvin Esparza, male    DOB: 21-Dec-1960, 60 y.o.   MRN: [id number]  HPI  Mr Senat is a 60 y/o male with a history of atrial fibrillation, PE, alcohol use, tobacco use (chewing tobacco) and chronic heart failure.   Echo report from 06/08/19 reviewed and showed an EF of 35-40% along with mild LVH and mild LAE.   Admitted 02/25/20 due to shortness of breath and palpitations. Cardiology & GI consults obtained. Placed on diltiazem drip initially for atrial fibrillation and then transitioned to oral medications. Torsemide decreased to elevated creatinine. EGD performed due to dysphagia. Multiple gastric erosions, mid-esophageal white lesion, and portal gastropathy seen. Discharged after 2 days. Was in the ED 11/28/19 due to shortness of breath. Has been not taking diltiazem on days when he takes colchicine. Given dose of IV lasix and diltiazem with good urine output so he was released.   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   Past Medical History:  Diagnosis Date  . Alcohol abuse   . Chronic pain   . COVID-19    a. 02/2019  . HFrEF (heart failure with reduced ejection fraction) ([place])    a. 05/2019 Echo: EF 35-40%.  . History of medication noncompliance   . Longstanding persistent atrial fibrillation ([place])    a. Dx 03/2018; b. 05/2019 recurrent Afib in setting of PE. CHA2DS2VASc = 1-->Eliquis later changed to xarelto; b. 07/2019 s/p DCCV (150J); d. 08/2019 admit w/ AF RVR in settting of noncompliance - longstanding persistent afib since on bb/ccb rx.  Marland Kitchen NICM (nonischemic cardiomyopathy) ([place])    a. 05/2019 Echo: EF 35-40%, gr1 DD. Mod enlarged RV. Mildly dil LA. Mild to mod dil RA. Triv AI; b. 05/2019 MV: EF 32%, no ischemia.  . Pulmonary embolism ([place])    a. 05/2019 CTA Chest: mild amt of PE w/in a lower lobe branch of RPA; b. 08/2019 CTA Chest: Small PE in RML and  RLL PA.   Past Surgical History:  Procedure Laterality Date  . CARDIOVERSION N/A 07/29/2019   Procedure: CARDIOVERSION;  Surgeon: Minna Merritts, MD;  Location: [facility] OR[facility];  Service: Cardiovascular;  Laterality: N/A;  . ESOPHAGOGASTRODUODENOSCOPY (EGD) WITH PROPOFOL N/A 02/27/2020   Procedure: ESOPHAGOGASTRODUODENOSCOPY (EGD) WITH PROPOFOL;  Surgeon: Lin Landsman, MD;  Location: [facility];  Service: Gastroenterology;  Laterality: N/A;   Family History  Problem Relation Age of Onset  . Bone cancer Father   . Stroke Father   . Diabetes Sister    Social History   Tobacco Use  . Smoking status: Never Smoker  . Smokeless tobacco: Current User    Types: Chew  Substance Use Topics  . Alcohol use: Yes    Alcohol/week: 24.0 standard drinks    Types: 24 Cans of beer per week    Comment: 6 beers daily, weekend drinks more-   No Known Allergies    Review of Systems  Constitutional: Negative for appetite change and fatigue.  HENT: Negative for congestion, postnasal drip and sore throat.   Eyes: Negative.   Respiratory: Positive for shortness of breath. Negative for cough and chest tightness.   Cardiovascular: Negative for chest pain, palpitations and leg swelling.  Gastrointestinal: Positive for abdominal distention. Negative for abdominal pain.  Endocrine: Negative.   Genitourinary: Negative.   Musculoskeletal: Positive for back pain. Negative for neck pain.  Skin:  Negative.   Allergic/Immunologic: Negative.   Neurological: Negative for dizziness and light-headedness.  Hematological: Negative for adenopathy. Does not bruise/bleed easily.  Psychiatric/Behavioral: Negative for dysphoric mood and sleep disturbance (sleeping on 2 pillows). The patient is nervous/anxious.      Physical Exam Vitals and nursing note reviewed.  Constitutional:      Appearance: He is well-developed.  HENT:     Head: Normocephalic and atraumatic.  Neck:     Vascular: No JVD.   Cardiovascular:     Rate and Rhythm: Normal rate. Rhythm irregular.  Pulmonary:     Effort: Pulmonary effort is normal. No respiratory distress.     Breath sounds: No wheezing or rales.  Abdominal:     General: There is distension.     Palpations: Abdomen is soft.     Tenderness: There is no abdominal tenderness.  Musculoskeletal:     Cervical back: Normal range of motion and neck supple.     Right lower leg: No tenderness. No edema.     Left lower leg: No tenderness. No edema.  Skin:    General: Skin is warm and dry.  Neurological:     General: No focal deficit present.     Mental Status: He is alert and oriented to person, place, and time.  Psychiatric:        Mood and Affect: Mood is anxious.        Behavior: Behavior normal.    Assessment & Plan:  1: Chronic heart failure with reduced ejection fraction- - NYHA class II - euvolemic today - weighing daily; reminded to call for an overnight weight gain of >2 pounds or a weekly weight gain of >5 pounds - weight 203.6 pounds from last visit here 4.5 months ago - not adding salt to his food and tries to be aware of his sodium intake - encouraged him to decrease fluid consumption down to 64 ounces/ day unless he's sweating a lot - saw cardiology Sharolyn Douglas) 03/01/20 - BNP 02/25/20 was 815.5  2: Atrial fibrillation/ PE- - saw PCP Ouida Sills) 09/26/19 - taking 150mg  metoprolol BID right now; not a candidate for corlanor due to AF - BMP 03/01/20 reviewed and showed sodium 142, potassium 4.8, creatinine 1.25 and GFR 63  3: Alcohol use- - + chews tobacco - drinks 3-4 beers daily (12 ounces each)   Medication list reviewed.

## 2020-04-23 NOTE — Progress Notes (Deleted)
Cardiology Office Note    Date:  04/23/2020   ID:  Calvin Esparza, DOB May 24, 1960, MRN [id number]  PCP:  [name], MD  Cardiologist:  Ida Rogue, MD  Electrophysiologist:  None   Chief Complaint: Follow up  History of Present Illness:   Calvin Esparza is a 60 y.o. male with history of persistent Afib s/p DCCV in 07/2019 with subsequent redevelopment of Afib, HFrEF secondary to NICM, alcohol use, PE in 05/2019 and again in 08/2019 in the setting of medication noncompliance, COVID infection in 02/2019, and chronic pain who presents for follow up of his cardiomyopathy and Afib.   He was initially diagnosed with Afib in 03/2018 when he presented with chest pain and dizziness. He was placed on Xarelto and beta blocker. In follow up in 09/2018, he was intermittently compliant with medications. In 05/2019, he presented to the ED with chest tightness and dyspnea. He was found to be in Afib with RVR. CTA chest showed a PE within a lower branch of the right pulmonary artery. Echo showed an EF of 35-40%, Gr1DD, and a dilated RV. Lexiscan MPI in 05/2019 showed no significant ischemia. He was discharged on Eliquis, though this was subsequently changed to Xarelto for improved compliance. He underwent DCCV in 07/2019, though was readmitted in 08/2019 with recurrent Afib and PE with CTA chest showing small PE in the right middle and lower lobe pulmonary arteries in the context of medication noncompliance. He was readmitted in 09/2019 with Afib and presyncope secondary to dehydration and AKI. Since then, the focus has been on rate control and anticoagulation. He was admitted in 02/2020, with progressive dyspnea on exertion, increasing abdominal girth, lower extremity swelling, and orthopnea. He was noted to be in Afib with RVR with ventricular rates in the 170s bpm. He reported dysphagia over the prior several months, and in this setting had not been taking any of his medications except Xarelto. With IV  diuresis and resumption of home medications, his volume status and symptoms improved. He underwent EGD which showed multiple gastric erosions, mid esophageal while lesion, and portal gastropathy. No varices were noted. PPI was added along with outpatient GI follow up recommendation. He was last seen in the office on 03/01/2020, and was doing well. He had not consumed alcohol since his admission. Weight at that time of 191 pounds. He remained in Afib with ventricular rates in the 90s bpm.   ***   Labs independently reviewed: 02/2020 - BUN 15, SCr 1.25, potassium 4.8, TSH normal, albumin 4.4, AST 45, ALT normal, HGB 14.4, PLT 182 11/2019 - magnesium 1.9  Past Medical History:  Diagnosis Date  . Alcohol abuse   . Chronic pain   . COVID-19    a. 02/2019  . HFrEF (heart failure with reduced ejection fraction) ([place])    a. 05/2019 Echo: EF 35-40%.  . History of medication noncompliance   . Longstanding persistent atrial fibrillation ([place])    a. Dx 03/2018; b. 05/2019 recurrent Afib in setting of PE. CHA2DS2VASc = 1-->Eliquis later changed to xarelto; b. 07/2019 s/p DCCV (150J); d. 08/2019 admit w/ AF RVR in settting of noncompliance - longstanding persistent afib since on bb/ccb rx.  Marland Kitchen NICM (nonischemic cardiomyopathy) ([place])    a. 05/2019 Echo: EF 35-40%, gr1 DD. Mod enlarged RV. Mildly dil LA. Mild to mod dil RA. Triv AI; b. 05/2019 MV: EF 32%, no ischemia.  . Pulmonary embolism ([place])    a. 05/2019 CTA Chest: mild amt of PE  w/in a lower lobe branch of RPA; b. 08/2019 CTA Chest: Small PE in RML and RLL PA.    Past Surgical History:  Procedure Laterality Date  . CARDIOVERSION N/A 07/29/2019   Procedure: CARDIOVERSION;  Surgeon: Minna Merritts, MD;  Location: [facility] ORS;  Service: Cardiovascular;  Laterality: N/A;  . ESOPHAGOGASTRODUODENOSCOPY (EGD) WITH PROPOFOL N/A 02/27/2020   Procedure: ESOPHAGOGASTRODUODENOSCOPY (EGD) WITH PROPOFOL;  Surgeon: Lin Landsman, MD;  Location: [facility] ENDOSCOPY;   Service: Gastroenterology;  Laterality: N/A;    Current Medications: No outpatient medications have been marked as taking for the 04/23/20 encounter (Appointment) with Rise Mu, PA-C.    Allergies:   Patient has no known allergies.   Social History   Socioeconomic History  . Marital status: Married    Spouse name: Not on file  . Number of children: Not on file  . Years of education: Not on file  . Highest education level: Not on file  Occupational History  . Not on file  Tobacco Use  . Smoking status: Never Smoker  . Smokeless tobacco: Current User    Types: Chew  Vaping Use  . Vaping Use: Never used  Substance and Sexual Activity  . Alcohol use: Yes    Alcohol/week: 24.0 standard drinks    Types: 24 Cans of beer per week    Comment: 6 beers daily, weekend drinks more-  . Drug use: Not Currently  . Sexual activity: Not on file  Other Topics Concern  . Not on file  Social History Narrative   Lives locally w/ wife.  Does not routinely exercise.  Works full time in Development worker, international aid.   Social Determinants of Health   Financial Resource Strain: Not on file  Food Insecurity: Not on file  Transportation Needs: Not on file  Physical Activity: Not on file  Stress: Not on file  Social Connections: Not on file     Family History:  The patient's family history includes Bone cancer in his father; Diabetes in his sister; Stroke in his father.  ROS:   ROS   EKGs/Labs/Other Studies Reviewed:    Studies reviewed were summarized above. The additional studies were reviewed today:  2D echo 05/2019: 1. Left ventricular ejection fraction, by estimation, is 35 to 40%. The  left ventricle has moderately decreased function. The left ventricle  demonstrates global hypokinesis. There is mild left ventricular  hypertrophy. Left ventricular diastolic  parameters are consistent with Grade I diastolic dysfunction (impaired  relaxation).  2. Right ventricular systolic function is  mildly reduced. The right  ventricular size is moderately enlarged.  3. Left atrial size was mildly dilated.  4. Right atrial size was mild to moderately dilated.  5. The mitral valve is grossly normal. No evidence of mitral valve  regurgitation.  6. The aortic valve is tricuspid. Aortic valve regurgitation is trivial.  7. The inferior vena cava is normal in size with greater than 50%  respiratory variability, suggesting right atrial pressure of 3 mmHg. __________  Carlton Adam MPI 05/2019: Pharmacological myocardial perfusion imaging study with no significant ischemia Moderate global hypokinesis, EF estimated at 32% No EKG changes concerning for ischemia at peak stress or in recovery. Baseline EKG with atrial fibrillation, rate 100 bpm Low risk scan   EKG:  EKG is ordered today.  The EKG ordered today demonstrates ***  Recent Labs: 11/28/2019: Magnesium 1.9 02/25/2020: ALT 37; B Natriuretic Peptide 815.5; Hemoglobin 14.4; Platelets 182; TSH 1.449 03/01/2020: BUN 15; Creatinine, Ser 1.25; Potassium 4.8; Sodium  142  Recent Lipid Panel No results found for: CHOL, TRIG, HDL, CHOLHDL, VLDL, LDLCALC, LDLDIRECT  PHYSICAL EXAM:    VS:  There were no vitals taken for this visit.  BMI: There is no height or weight on file to calculate BMI.  Physical Exam  Wt Readings from Last 3 Encounters:  03/01/20 191 lb (86.6 kg)  02/27/20 194 lb 12.8 oz (88.4 kg)  12/12/19 203 lb 6 oz (92.3 kg)     ASSESSMENT & PLAN:   1. Persistent Afib: ***.  CHADS2VASc at least 2 (CHF, vascular disease).   2. HFrEF secondary to NICM:  3. History of DVT/PE:  4. Dysphagia/gastric erosions: ***.  He has a follow up appointment with GI next week.  5. Alcohol use:  6. Medication nonadherence:  Disposition: F/u with Dr. Rockey Situ or an APP in ***.   Medication Adjustments/Labs and Tests Ordered: Current medicines are reviewed at length with the patient today.  Concerns regarding medicines are outlined  above. Medication changes, Labs and Tests ordered today are summarized above and listed in the Patient Instructions accessible in Encounters.   Signed, Christell Faith, PA-C 04/23/2020 8:07 AM     [facility] [address] [place] [address] [place], [place] [address] [phone]

## 2020-04-24 ENCOUNTER — Ambulatory Visit: Payer: 59 | Admitting: Family

## 2020-04-24 ENCOUNTER — Telehealth: Payer: Self-pay | Admitting: Family

## 2020-04-24 NOTE — Telephone Encounter (Signed)
Patient did not show for his [facility] appointment on 04/24/20. Will attempt to reschedule.

## 2020-05-01 ENCOUNTER — Encounter: Payer: Self-pay | Admitting: Gastroenterology

## 2020-05-01 ENCOUNTER — Telehealth: Payer: Self-pay

## 2020-05-01 ENCOUNTER — Other Ambulatory Visit: Payer: Self-pay

## 2020-05-01 ENCOUNTER — Ambulatory Visit (INDEPENDENT_AMBULATORY_CARE_PROVIDER_SITE_OTHER): Payer: 59 | Admitting: Gastroenterology

## 2020-05-01 VITALS — BP 138/91 | HR 90 | Temp 97.4°F | Ht 70.0 in | Wt 205.0 lb

## 2020-05-01 DIAGNOSIS — Z1211 Encounter for screening for malignant neoplasm of colon: Secondary | ICD-10-CM

## 2020-05-01 DIAGNOSIS — K7460 Unspecified cirrhosis of liver: Secondary | ICD-10-CM | POA: Diagnosis not present

## 2020-05-01 DIAGNOSIS — K229 Disease of esophagus, unspecified: Secondary | ICD-10-CM

## 2020-05-01 MED ORDER — NA SULFATE-K SULFATE-MG SULF 17.5-3.13-1.6 GM/177ML PO SOLN: 354.0000 mL | Freq: Once | ORAL | 0 refills | Status: AC

## 2020-05-01 NOTE — Telephone Encounter (Signed)
Sending to PharmD for rec's re: anticoagulation. Richardson Dopp, PA-C    05/01/2020 11:28 AM

## 2020-05-01 NOTE — Telephone Encounter (Signed)
   Primary Cardiologist: Ida Rogue, MD  Chart reviewed as part of pre-operative protocol coverage.   See recommendations from PharmD regarding anticoagulation: "Per office protocol, patient can hold XARELTO for 2 days prior to procedure.    Patient should restart XARELTO on the evening of procedure or day after, at discretion of procedure MD"  Please call with questions. Richardson Dopp, PA-C 05/01/2020, 4:46 PM

## 2020-05-01 NOTE — Telephone Encounter (Signed)
Notes faxed to surgeon. This phone note will be removed from the preop pool. Richardson Dopp, PA-C  05/01/2020 4:48 PM

## 2020-05-01 NOTE — Patient Instructions (Signed)
Your Ultrasound of liver is scheduled for 05/08/2020 [facility] medical mall at 9:00am for a 9:30am scan. Nothing to eat or drink after midnight.

## 2020-05-01 NOTE — Telephone Encounter (Signed)
   [facility] Pre-operative Risk Assessment    Request for surgical clearance:  1. What type of surgery is being performed? Colonoscopy and EGD    2. When is this surgery scheduled? 05/24/2020   3. Are there any medications that need to be held prior to surgery and how long? Xarelto 83m   4. Practice name and name of physician performing surgery? [facility]    5. What is your office phone and fax number? [phone]  6. Anesthesia type (None, local, MAC, general) ? General    [name]2/15/2022, 11:12 AM  _________________________________________________________________   (provider comments below)

## 2020-05-01 NOTE — Telephone Encounter (Signed)
Patient with diagnosis of ATRIAL FIBRILLATION & PE IN June 2021 on [place] for anticoagulation.    Procedure: COLONOSCOPY & ENDOSCOPY Date of procedure: 05/24/2020   CHA2DS2-VASc Score = 2  This indicates a 2.2% annual risk of stroke. The patient's score is based upon: CHF History: Yes HTN History: Yes Diabetes History: No Stroke History: No Vascular Disease History: No Age Score: 0 Gender Score: 0     CrCl = 85ML/MIN Platelet count = 178  Per office protocol, patient can hold XARELTO for 2 days prior to procedure.    Patient should restart XARELTO on the evening of procedure or day after, at discretion of procedure MD

## 2020-05-01 NOTE — Progress Notes (Signed)
Cephas Darby, MD [address]  [place]  [place], [place] [address]  Main: [phone]  Fax: [phone]    Gastroenterology Consultation  Referring Provider:     [name], MD Primary Care Physician:  [name], MD Primary Gastroenterologist:  Dr. Cephas Darby Reason for Consultation:     Cirrhosis of liver        HPI:   Calvin Esparza is a 60 y.o. male referred by Dr. Ouida Sills, Ocie Cornfield, MD  for consultation & management of cirrhosis of liver Patient has history of alcohol use, history of A. fib on Xarelto, nonischemic cardiomyopathy, EF of 40%, compensated cirrhosis of liver presented as a hospital follow-up to establish care for cirrhosis.  Patient is incidentally found to have cirrhosis of liver based on the abdominal ultrasound during his hospitalization in December 2021.  There was no evidence of ascites.  He also had history of dysphagia, underwent EGD, found to have gastric erosions, portal hypertensive gastropathy and white nummular lesions in the esophagus, histology consistent with epidermoid metaplasia. Patient reports doing well since discharge.  He resumed drinking alcohol, 5-6 beers daily.  He also chews tobacco.  He denies abdominal pain, nausea, hematemesis, melena, rectal bleeding, abdominal distention, swelling of legs.  He is also followed by cardiology for A. fib and nonischemic cardiomyopathy.  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 fib  GI Procedures:  Upper endoscopy 02/27/2019 - Normal duodenal bulb and second portion of the duodenum. - Erosive gastropathy with no bleeding and no stigmata of recent  bleeding. Biopsied. - Erythematous mucosa in the gastric body. Biopsied. - Esophagogastric landmarks identified. - White nummular lesions in esophageal mucosa. Biopsied. DIAGNOSIS:  A. STOMACH; RANDOM COLD BIOPSY:  - MILD REACTIVE GASTROPATHY.  - NEGATIVE FOR ACTIVE INFLAMMATION, H. PYLORI, INTESTINAL METAPLASIA,  DYSPLASIA, AND MALIGNANCY.   B. ESOPHAGUS, 35 CM; COLD BIOPSY:  - EPIDERMOID METAPLASIA, SEE COMMENT.  - NEGATIVE FOR DYSPLASIA AND MALIGNANCY.   Past Medical History:  Diagnosis Date  . Alcohol abuse   . Chronic pain   . COVID-19    a. 02/2019  . HFrEF (heart failure with reduced ejection fraction) ([place])    a. 05/2019 Echo: EF 35-40%.  . History of medication noncompliance   . Longstanding persistent atrial fibrillation ([place])    a. Dx 03/2018; b. 05/2019 recurrent Afib in setting of PE. CHA2DS2VASc = 1-->Eliquis later changed to xarelto; b. 07/2019 s/p DCCV (150J); d. 08/2019 admit w/ AF RVR in settting of noncompliance - longstanding persistent afib since on bb/ccb rx.  Marland Kitchen NICM (nonischemic cardiomyopathy) ([place])    a. 05/2019 Echo: EF 35-40%, gr1 DD. Mod enlarged RV. Mildly dil LA. Mild to mod dil RA. Triv AI; b. 05/2019 MV: EF 32%, no ischemia.  . Pulmonary embolism ([place])    a. 05/2019 CTA Chest: mild amt of PE w/in a lower lobe branch of RPA; b. 08/2019 CTA Chest: Small PE in RML and RLL PA.    Past Surgical History:  Procedure Laterality Date  . CARDIOVERSION N/A 07/29/2019   Procedure: CARDIOVERSION;  Surgeon: Minna Merritts, MD;  Location: [facility] ORS;  Service: Cardiovascular;  Laterality: N/A;  . ESOPHAGOGASTRODUODENOSCOPY (EGD) WITH PROPOFOL  N/A 02/27/2020   Procedure: ESOPHAGOGASTRODUODENOSCOPY (EGD) WITH PROPOFOL;  Surgeon: Lin Landsman, MD;  Location: [facility] ENDOSCOPY;  Service: Gastroenterology;  Laterality: N/A;    Current Outpatient Medications:  .  colchicine-probenecid 0.5-500 MG tablet, Take 1 tablet by mouth 2 (two) times daily as needed (joint  pain)., Disp: , Rfl:  .  diltiazem (CARDIZEM CD) 180 MG 24 hr capsule, Take 180 mg by mouth daily., Disp: , Rfl:  .  losartan (COZAAR) 25 MG tablet, Take 1 tablet (25 mg total) by mouth daily., Disp: 30 tablet, Rfl: 1 .  metoprolol succinate (TOPROL-XL) 100 MG 24 hr tablet, Take 1 tablet (100 mg total) by mouth in the morning and at bedtime. Take with or immediately following a meal., Disp: 180 tablet, Rfl: 3 .  Na Sulfate-K Sulfate-Mg Sulf 17.5-3.13-1.6 GM/177ML SOLN, Take 354 mLs by mouth once for 1 dose., Disp: 354 mL, Rfl: 0 .  omeprazole (PRILOSEC) 20 MG capsule, Take 1 capsule (20 mg total) by mouth 2 (two) times daily before a meal., Disp: 60 capsule, Rfl: 1 .  potassium chloride SA (KLOR-CON) 20 MEQ tablet, Take 1 tablet (20 mEq total) by mouth daily., Disp: 90 tablet, Rfl: 3 .  torsemide (DEMADEX) 20 MG tablet, Take 2 tablets (40 mg total) by mouth daily., Disp: , Rfl:  .  XARELTO 20 MG TABS tablet, Take 1 tablet (20 mg total) by mouth daily with supper., Disp: 90 tablet, Rfl: 2   Family History  Problem Relation Age of Onset  . Bone cancer Father   . Stroke Father   . Diabetes Sister      Social History   Tobacco Use  . Smoking status: Never Smoker  . Smokeless tobacco: Current User    Types: Chew  Vaping Use  . Vaping Use: Never used  Substance Use Topics  . Alcohol use: Yes    Alcohol/week: 24.0 standard drinks    Types: 24 Cans of beer per week    Comment: 6 beers daily, weekend drinks more-  . Drug use: Not Currently    Allergies as of 05/01/2020  . (No Known Allergies)    Review of Systems:    All systems reviewed and negative except where noted in HPI.   Physical Exam:  BP (!) 138/91 (BP Location: Left Arm, Patient Position: Sitting, Cuff Size: Normal)   Pulse 90   Temp (!) 97.4 F (36.3 C) (Oral)   Ht 5\' 10"  (1.778 m)   Wt 205 lb (93 kg)   BMI 29.41 kg/m  No LMP for male patient.  General:   Alert,  Well-developed, well-nourished, pleasant and  cooperative in NAD Head:  Normocephalic and atraumatic. Eyes:  Sclera clear, no icterus.   Conjunctiva pink. Ears:  Normal auditory acuity. Nose:  No deformity, discharge, or lesions. Mouth:  No deformity or lesions,oropharynx pink & moist. Neck:  Supple; no masses or thyromegaly. Lungs:  Respirations even and unlabored.  Clear throughout to auscultation.   No wheezes, crackles, or rhonchi. No acute distress. Heart:  Regular rate and rhythm; no murmurs, clicks, rubs, or gallops. Abdomen:  Normal bowel sounds. Soft, non-tender and non-distended without masses, hepatosplenomegaly or hernias noted.  No guarding or rebound tenderness.   Rectal: Not performed Msk:  Symmetrical without gross deformities. Good, equal movement & strength bilaterally. Pulses:  Normal pulses noted. Extremities:  No clubbing or edema.  No cyanosis. Neurologic:  Alert and oriented x3;  grossly normal neurologically. Skin:  Intact without significant lesions or rashes.  No jaundice.  Spider nevi on the abdomen and upper chest Lymph Nodes:  No significant cervical adenopathy. Psych:  Alert and cooperative. Normal mood and affect.  Imaging Studies: Reviewed  Assessment and Plan:   Calvin Esparza is a 60 y.o. male with history of alcohol use, nonischemic cardiomyopathy EF of 35 to 40%, A. 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  No evidence of esophageal varices Currently euvolemic, maintained on torsemide for nonischemic cardiomyopathy HCC screening: No liver lesions based on the recent ultrasound, check serum AFP levels No evidence of hepatic encephalopathy HRS, none, preserved renal function Recommend complete abstinence from alcohol use, discussed about progression of cirrhosis if he  continues to consume alcohol  Epidermoid metaplasia of the esophagus: Literature suggests that it can be a precursor lesion for squamous cell cancer, recommend repeat EGD with biopsies Continue omeprazole 20 mg twice daily Patient chews tobacco, highly encouraged him to stop chewing tobacco and quit drinking alcohol as both can increase the risk for esophageal cancer  Colon cancer screening Recommend colonoscopy  We will obtain cardiac clearance as well as blood thinner request before proceeding with endoscopic evaluation  I have discussed alternative options, risks & benefits,  which include, but are not limited to, bleeding, infection, perforation,respiratory complication & drug reaction.  The patient agrees with this plan & written consent will be obtained.      Follow up in 3 months   Cephas Darby, MD

## 2020-05-02 ENCOUNTER — Telehealth: Payer: Self-pay

## 2020-05-02 DIAGNOSIS — I5042 Chronic combined systolic (congestive) and diastolic (congestive) heart failure: Secondary | ICD-10-CM

## 2020-05-02 LAB — COMPREHENSIVE METABOLIC PANEL
ALT: 18 IU/L (ref 0–44)
AST: 24 IU/L (ref 0–40)
Albumin/Globulin Ratio: 1.4 (ref 1.2–2.2)
Albumin: 4.5 g/dL (ref 3.8–4.9)
Alkaline Phosphatase: 75 IU/L (ref 44–121)
BUN/Creatinine Ratio: 20 (ref 9–20)
BUN: 28 mg/dL — ABNORMAL HIGH (ref 6–24)
Bilirubin Total: 0.5 mg/dL (ref 0.0–1.2)
CO2: 20 mmol/L (ref 20–29)
Calcium: 10.5 mg/dL — ABNORMAL HIGH (ref 8.7–10.2)
Chloride: 99 mmol/L (ref 96–106)
Creatinine, Ser: 1.43 mg/dL — ABNORMAL HIGH (ref 0.76–1.27)
GFR calc Af Amer: 62 mL/min/{1.73_m2} (ref >59)
GFR calc non Af Amer: 54 mL/min/{1.73_m2} — ABNORMAL LOW (ref >59)
Globulin, Total: 3.3 g/dL (ref 1.5–4.5)
Glucose: 102 mg/dL — ABNORMAL HIGH (ref 65–99)
Potassium: 4.3 mmol/L (ref 3.5–5.2)
Sodium: 140 mmol/L (ref 134–144)
Total Protein: 7.8 g/dL (ref 6.0–8.5)

## 2020-05-02 LAB — HEPATITIS PANEL, ACUTE
Hep A IgM: NEGATIVE
Hep B C IgM: NEGATIVE
Hep C Virus Ab: <0.1 s/co ratio (ref 0.0–0.9)
Hepatitis B Surface Ag: NEGATIVE

## 2020-05-02 LAB — IRON,TIBC AND FERRITIN PANEL
Ferritin: 189 ng/mL (ref 30–400)
Iron Saturation: 33 % (ref 15–55)
Iron: 129 ug/dL (ref 38–169)
Total Iron Binding Capacity: 394 ug/dL (ref 250–450)
UIBC: 265 ug/dL (ref 111–343)

## 2020-05-02 LAB — CBC
Hematocrit: 43.9 % (ref 37.5–51.0)
Hemoglobin: 14.6 g/dL (ref 13.0–17.7)
MCH: 30.7 pg (ref 26.6–33.0)
MCHC: 33.3 g/dL (ref 31.5–35.7)
MCV: 92 fL (ref 79–97)
Platelets: 219 10*3/uL (ref 150–450)
RBC: 4.76 x10E6/uL (ref 4.14–5.80)
RDW: 13.8 % (ref 11.6–15.4)
WBC: 4.5 10*3/uL (ref 3.4–10.8)

## 2020-05-02 LAB — VITAMIN D 25 HYDROXY (VIT D DEFICIENCY, FRACTURES): Vit D, 25-Hydroxy: 32 ng/mL (ref 30.0–100.0)

## 2020-05-02 LAB — PROTIME-INR
INR: 1.3 — ABNORMAL HIGH (ref 0.9–1.2)
Prothrombin Time: 13.5 s — ABNORMAL HIGH (ref 9.1–12.0)

## 2020-05-02 LAB — AFP TUMOR MARKER: AFP, Serum, Tumor Marker: 2.8 ng/mL (ref 0.0–8.3)

## 2020-05-02 MED ORDER — TORSEMIDE 20 MG PO TABS: 20.0000 mg | ORAL_TABLET | Freq: Every day | ORAL | Status: DC

## 2020-05-02 MED ORDER — POTASSIUM CHLORIDE CRYS ER 20 MEQ PO TBCR: 10.0000 meq | EXTENDED_RELEASE_TABLET | Freq: Every day | ORAL | 3 refills | Status: DC

## 2020-05-02 NOTE — Telephone Encounter (Signed)
Patient is to stop Xarelto 2 days prior to procedure and restart the medication on the evening of procedure or the day after

## 2020-05-02 NOTE — Telephone Encounter (Signed)
-----   Message from Theora Gianotti, NP sent at 05/02/2020  1:32 PM EST ----- Thank you for forwarding.  I will have our team reach out to him to reduce torsemide to 20mg  daily and we can arrange for a f/u BMET next week.  Pam - will you pls have Mr. Lucchese reduce torsemide to 20mg  daily (currently taking 40 daily) and reduce his KCl to 1/2 tab (10 meq) daily?  Pls also have him come in for a bmet next week?  Thanks,  Gerald Stabs ----- Message ----- From: Lin Landsman, MD Sent: 05/02/2020   1:13 PM EST To: Theora Gianotti, NP  Tillie Fantasia saw this patient in clinic yesterday.  Checked his labs which showed mildly elevated BUN/creatinine.  He appeared euvolemic on my exam, would you like to reduce his torsemide dose?  Thanks RV

## 2020-05-02 NOTE — Telephone Encounter (Signed)
Called and left patient a VM per DPR on file. Encouraged patient to call back with any questions or concerns.

## 2020-05-02 NOTE — Telephone Encounter (Addendum)
Called and left a detail message for patient explaining the instructions asked patient to call back to confirmed he received the instructions

## 2020-05-03 NOTE — Telephone Encounter (Signed)
Patient verbalized understanding of instructions  

## 2020-05-03 NOTE — Telephone Encounter (Signed)
Tried to call patient and left a message for call back  ?

## 2020-05-05 NOTE — Progress Notes (Signed)
Will cc 

## 2020-05-08 ENCOUNTER — Telehealth: Payer: Self-pay

## 2020-05-08 ENCOUNTER — Other Ambulatory Visit: Payer: Self-pay

## 2020-05-08 ENCOUNTER — Ambulatory Visit
Admission: RE | Admit: 2020-05-08 | Discharge: 2020-05-08 | Disposition: A | Discharge status: Home / Self Care | Payer: 59 | Source: Ambulatory Visit | Attending: Gastroenterology | Admitting: Gastroenterology

## 2020-05-08 DIAGNOSIS — K7460 Unspecified cirrhosis of liver: Secondary | ICD-10-CM | POA: Diagnosis not present

## 2020-05-08 NOTE — Telephone Encounter (Signed)
-----   Message from Lin Landsman, MD sent at 05/08/2020  1:12 PM EST ----- Ultrasound of the liver reveals fatty liver only.  He does not have cirrhosis which is good news  RV

## 2020-05-08 NOTE — Telephone Encounter (Signed)
Called and left a message for call back  

## 2020-05-08 NOTE — Telephone Encounter (Signed)
Patient verbalized understanding of results  

## 2020-05-08 NOTE — Telephone Encounter (Signed)
Left voicemail message to call back for review of changes and other recommendations.

## 2020-05-08 NOTE — Telephone Encounter (Signed)
Spoke with patients wife per release form and reviewed recommendations to decrease torsemide 20 mg to one tablet once daily and also decrease potassium to 1/2 tablet once daily. Also discussed that we would like repeat labs in one week which would be next Monday or Tuesday over at the [facility] entrance at registration. Advised no appointment is needed for this and to have him check in at registration and they will instruct him where to go. She repeated all of these changes back in detail and inquired about upcoming appointments. Confirmed he has appointment on 06/04/20 at 11:20 AM with Dr. Rockey Situ. Reviewed again that repeat labs are needed next week so that we can see if these changes help. She verbalized understanding of our conversation, agreement with plan, and had no further questions at this time.

## 2020-05-15 ENCOUNTER — Telehealth: Payer: Self-pay

## 2020-05-15 ENCOUNTER — Other Ambulatory Visit
Admission: RE | Admit: 2020-05-15 | Discharge: 2020-05-15 | Disposition: A | Discharge status: Home / Self Care | Payer: 59 | Attending: Nurse Practitioner | Admitting: Nurse Practitioner

## 2020-05-15 ENCOUNTER — Telehealth: Payer: Self-pay | Admitting: *Deleted

## 2020-05-15 ENCOUNTER — Other Ambulatory Visit: Payer: Self-pay

## 2020-05-15 DIAGNOSIS — I5042 Chronic combined systolic (congestive) and diastolic (congestive) heart failure: Secondary | ICD-10-CM | POA: Diagnosis present

## 2020-05-15 LAB — BASIC METABOLIC PANEL
Anion gap: 15 (ref 5–15)
BUN: 19 mg/dL (ref 6–20)
CO2: 23 mmol/L (ref 22–32)
Calcium: 9.7 mg/dL (ref 8.9–10.3)
Chloride: 102 mmol/L (ref 98–111)
Creatinine, Ser: 1.28 mg/dL — ABNORMAL HIGH (ref 0.61–1.24)
GFR, Estimated: >60 mL/min (ref >60)
Glucose, Bld: 108 mg/dL — ABNORMAL HIGH (ref 70–99)
Potassium: 4.2 mmol/L (ref 3.5–5.1)
Sodium: 140 mmol/L (ref 135–145)

## 2020-05-15 NOTE — Telephone Encounter (Signed)
Left voicemail message to call back for results.  

## 2020-05-15 NOTE — Telephone Encounter (Signed)
-----   Message from Shelby Mattocks, [place] sent at 03/01/2020  3:43 PM EST ----- 3 month EGD to scheduled in 3 months for esophageal lesion

## 2020-05-15 NOTE — Telephone Encounter (Signed)
-----   Message from Theora Gianotti, NP sent at 05/15/2020  4:41 PM EST ----- Kidney function improved and more similar to prior baseline.  Lytes ok.

## 2020-05-15 NOTE — Telephone Encounter (Signed)
Called and left a message for call back  

## 2020-05-16 NOTE — Telephone Encounter (Signed)
Spoke with patient and reviewed lab results. He was appreciative for the call with no further questions at this time.

## 2020-05-16 NOTE — Telephone Encounter (Signed)
Called and left a message for call back  

## 2020-05-17 NOTE — Telephone Encounter (Signed)
Called and left a message. Sent letter

## 2020-05-22 ENCOUNTER — Other Ambulatory Visit
Admission: RE | Admit: 2020-05-22 | Discharge: 2020-05-22 | Disposition: A | Discharge status: Home / Self Care | Payer: 59 | Source: Ambulatory Visit | Attending: Gastroenterology | Admitting: Gastroenterology

## 2020-05-22 ENCOUNTER — Other Ambulatory Visit: Payer: Self-pay

## 2020-05-22 DIAGNOSIS — Z20822 Contact with and (suspected) exposure to covid-19: Secondary | ICD-10-CM | POA: Diagnosis not present

## 2020-05-22 DIAGNOSIS — Z01812 Encounter for preprocedural laboratory examination: Secondary | ICD-10-CM | POA: Diagnosis present

## 2020-05-23 LAB — SARS CORONAVIRUS 2 (TAT 6-24 HRS): SARS Coronavirus 2: NEGATIVE

## 2020-05-24 ENCOUNTER — Ambulatory Visit: Payer: 59 | Admitting: Anesthesiology

## 2020-05-24 ENCOUNTER — Ambulatory Visit
Admission: RE | Admit: 2020-05-24 | Discharge: 2020-05-24 | Disposition: A | Discharge status: Home / Self Care | Payer: 59 | Attending: Gastroenterology | Admitting: Gastroenterology

## 2020-05-24 ENCOUNTER — Encounter: Payer: Self-pay | Admitting: Gastroenterology

## 2020-05-24 ENCOUNTER — Encounter
Admission: RE | Disposition: A | Discharge status: Home / Self Care | Payer: Self-pay | Source: Home / Self Care | Attending: Gastroenterology

## 2020-05-24 DIAGNOSIS — K635 Polyp of colon: Secondary | ICD-10-CM | POA: Diagnosis not present

## 2020-05-24 DIAGNOSIS — K7460 Unspecified cirrhosis of liver: Secondary | ICD-10-CM | POA: Insufficient documentation

## 2020-05-24 DIAGNOSIS — Z79899 Other long term (current) drug therapy: Secondary | ICD-10-CM | POA: Diagnosis not present

## 2020-05-24 DIAGNOSIS — Z1211 Encounter for screening for malignant neoplasm of colon: Secondary | ICD-10-CM | POA: Diagnosis not present

## 2020-05-24 DIAGNOSIS — K5730 Diverticulosis of large intestine without perforation or abscess without bleeding: Secondary | ICD-10-CM | POA: Diagnosis not present

## 2020-05-24 DIAGNOSIS — Z7901 Long term (current) use of anticoagulants: Secondary | ICD-10-CM | POA: Diagnosis not present

## 2020-05-24 DIAGNOSIS — Z833 Family history of diabetes mellitus: Secondary | ICD-10-CM | POA: Insufficient documentation

## 2020-05-24 DIAGNOSIS — K644 Residual hemorrhoidal skin tags: Secondary | ICD-10-CM | POA: Insufficient documentation

## 2020-05-24 DIAGNOSIS — Z86711 Personal history of pulmonary embolism: Secondary | ICD-10-CM | POA: Diagnosis not present

## 2020-05-24 DIAGNOSIS — D123 Benign neoplasm of transverse colon: Secondary | ICD-10-CM | POA: Insufficient documentation

## 2020-05-24 DIAGNOSIS — K2270 Barrett's esophagus without dysplasia: Secondary | ICD-10-CM | POA: Insufficient documentation

## 2020-05-24 DIAGNOSIS — K229 Disease of esophagus, unspecified: Secondary | ICD-10-CM

## 2020-05-24 DIAGNOSIS — Z808 Family history of malignant neoplasm of other organs or systems: Secondary | ICD-10-CM | POA: Diagnosis not present

## 2020-05-24 DIAGNOSIS — K3189 Other diseases of stomach and duodenum: Secondary | ICD-10-CM | POA: Diagnosis not present

## 2020-05-24 HISTORY — PX: ESOPHAGOGASTRODUODENOSCOPY (EGD) WITH PROPOFOL: SHX5813

## 2020-05-24 HISTORY — PX: COLONOSCOPY WITH PROPOFOL: SHX5780

## 2020-05-24 SURGERY — COLONOSCOPY WITH PROPOFOL
Anesthesia: General

## 2020-05-24 MED ORDER — PROPOFOL 10 MG/ML IV BOLUS
INTRAVENOUS | Status: AC
  Filled 2020-05-24: qty 20

## 2020-05-24 MED ORDER — SODIUM CHLORIDE 0.9 % IV SOLN: INTRAVENOUS | Status: DC

## 2020-05-24 MED ORDER — METOPROLOL TARTRATE 5 MG/5ML IV SOLN
INTRAVENOUS | Status: AC
  Filled 2020-05-24: qty 5

## 2020-05-24 MED ORDER — PROPOFOL 500 MG/50ML IV EMUL
INTRAVENOUS | Status: AC
  Filled 2020-05-24: qty 50

## 2020-05-24 MED ORDER — LIDOCAINE HCL (CARDIAC) PF 100 MG/5ML IV SOSY
PREFILLED_SYRINGE | INTRAVENOUS | Status: DC | PRN
  Administered 2020-05-24: 80 mg via INTRAVENOUS

## 2020-05-24 MED ORDER — ESMOLOL HCL 100 MG/10ML IV SOLN
INTRAVENOUS | Status: AC
  Filled 2020-05-24: qty 10

## 2020-05-24 MED ORDER — LIDOCAINE HCL (PF) 2 % IJ SOLN
INTRAMUSCULAR | Status: AC
  Filled 2020-05-24: qty 5

## 2020-05-24 MED ORDER — METOPROLOL TARTRATE 5 MG/5ML IV SOLN
INTRAVENOUS | Status: DC | PRN
  Administered 2020-05-24 (×2): 10 mg via INTRAVENOUS

## 2020-05-24 MED ORDER — PROPOFOL 500 MG/50ML IV EMUL
INTRAVENOUS | Status: DC | PRN
  Administered 2020-05-24: 150 ug/kg/min via INTRAVENOUS

## 2020-05-24 MED ORDER — ESMOLOL HCL 100 MG/10ML IV SOLN
INTRAVENOUS | Status: DC | PRN
  Administered 2020-05-24 (×2): 10 mg via INTRAVENOUS

## 2020-05-24 NOTE — Anesthesia Preprocedure Evaluation (Signed)
Anesthesia Evaluation  Patient identified by MRN, date of birth, ID band Patient awake  General Assessment Comment:Patient with dysphagia. Denies nausea or vomiting. NPO since before midnight.  Reviewed: Allergy & Precautions, NPO status , Patient's Chart, lab work & pertinent test results  History of Anesthesia Complications Negative for: history of anesthetic complications  Airway Mallampati: II  TM Distance: >3 FB Neck ROM: Full   Comment: Large beard Dental  (+) Teeth Intact, Dental Advidsory Given   Pulmonary shortness of breath and with exertion, neg sleep apnea, neg COPD, Patient abstained from smoking.Not current smoker,  Shortness of breath since covid infection 02/2019   Pulmonary exam normal breath sounds clear to auscultation       Cardiovascular Exercise Tolerance: Good METShypertension, Pt. on medications (-) angina+CHF  (-) CAD and (-) Past MI + dysrhythmias Atrial Fibrillation  Rhythm:Irregular Rate:Normal - Systolic murmurs Recent PE  TTE 2021: 1. Left ventricular ejection fraction, by estimation, is 35 to 40%. The  left ventricle has moderately decreased function. The left ventricle  demonstrates global hypokinesis. There is mild left ventricular  hypertrophy. Left ventricular diastolic  parameters are consistent with Grade I diastolic dysfunction (impaired  relaxation).  2. Right ventricular systolic function is mildly reduced. The right  ventricular size is moderately enlarged.  3. Left atrial size was mildly dilated.  4. Right atrial size was mild to moderately dilated.  5. The mitral valve is grossly normal. No evidence of mitral valve  regurgitation.  6. The aortic valve is tricuspid. Aortic valve regurgitation is trivial.  7. The inferior vena cava is normal in size with greater than 50%  respiratory variability, suggesting right atrial pressure of 3 mmHg.    Neuro/Psych negative neurological  ROS  negative psych ROS   GI/Hepatic neg GERD  ,(+)     (-) substance abuse  ,   Endo/Other  neg diabetes  Renal/GU negative Renal ROS     Musculoskeletal   Abdominal   Peds  Hematology   Anesthesia Other Findings Past Medical History: No date: Alcohol abuse No date: Cardiomyopathy [facility])     Comment:  a. 05/2019 Echo: EF 35-40%, gr1 DD. Mod enlarged RV.               Mildly dil LA. Mild to mod dil RA. Triv AI. No date: Chronic pain No date: COVID-19     Comment:  a. 02/2019 No date: Persistent atrial fibrillation (HCC) No date: Pulmonary embolism ([place])     Comment:  a. 05/2019 CTA Chest: mild amt of PE w/in a lower lobe               branch of RPA.  Reproductive/Obstetrics                             Anesthesia Physical  Anesthesia Plan  ASA: III  Anesthesia Plan: General   Post-op Pain Management:    Induction: Intravenous  PONV Risk Score and Plan: 2 and Propofol infusion and TIVA  Airway Management Planned: Nasal Cannula and Natural Airway  Additional Equipment: None  Intra-op Plan:   Post-operative Plan:   Informed Consent: I have reviewed the patients History and Physical, chart, labs and discussed the procedure including the risks, benefits and alternatives for the proposed anesthesia with the patient or authorized representative who has indicated his/her understanding and acceptance.     Dental advisory given  Plan Discussed with: CRNA and Surgeon  Anesthesia  Plan Comments: (Discussed risks of anesthesia with patient, including possibility of difficulty with spontaneous ventilation under anesthesia necessitating airway intervention, PONV, and rare risks such as cardiac or respiratory or neurological events. Patient understands.)        Anesthesia Quick Evaluation

## 2020-05-24 NOTE — Op Note (Signed)
[facility] Gastroenterology Patient Name: Calvin Esparza Procedure Date: 05/24/2020 8:19 AM MRN: [id number] Account #: [id number] Date of Birth: Jul 25, 1960 Admit Type: Outpatient Age: 60 Room: [facility] ENDO ROOM 4 Gender: Male Note Status: Finalized Procedure:             Colonoscopy Indications:           Screening for colorectal malignant neoplasm, Last                         colonoscopy: June 2015 Providers:             Lin Landsman MD, MD Referring MD:          Ocie Cornfield. Ouida Sills MD, MD (Referring MD) Medicines:             General Anesthesia Complications:         No immediate complications. Estimated blood loss: None. Procedure:             Pre-Anesthesia Assessment:                        - Prior to the procedure, a History and Physical was                         performed, and patient medications and allergies were                         reviewed. The patient is competent. The risks and                         benefits of the procedure and the sedation options and                         risks were discussed with the patient. All questions                         were answered and informed consent was obtained.                         Patient identification and proposed procedure were                         verified by the physician, the nurse, the                         anesthesiologist, the anesthetist and the technician                         in the pre-procedure area in the procedure room in the                         endoscopy suite. Mental Status Examination: alert and                         oriented. Airway Examination: normal oropharyngeal                         airway and neck mobility. Respiratory Examination:  clear to auscultation. CV Examination: irregularly                         irregular rate and rhythm. Prophylactic Antibiotics:                         The patient does not require prophylactic  antibiotics.                         Prior Anticoagulants: The patient has taken Xarelto                         (rivaroxaban), last dose was 2 days prior to                         procedure. ASA Grade Assessment: III - A patient with                         severe systemic disease. After reviewing the risks and                         benefits, the patient was deemed in satisfactory                         condition to undergo the procedure. The anesthesia                         plan was to use general anesthesia. Immediately prior                         to administration of medications, the patient was                         re-assessed for adequacy to receive sedatives. The                         heart rate, respiratory rate, oxygen saturations,                         blood pressure, adequacy of pulmonary ventilation, and                         response to care were monitored throughout the                         procedure. The physical status of the patient was                         re-assessed after the procedure.                        After obtaining informed consent, the colonoscope was                         passed under direct vision. Throughout the procedure,                         the patient's blood pressure, pulse, and oxygen  saturations were monitored continuously. The                         Colonoscope was introduced through the anus and                         advanced to the the cecum, identified by appendiceal                         orifice and ileocecal valve. The colonoscopy was                         performed without difficulty. The patient tolerated                         the procedure well. The quality of the bowel                         preparation was evaluated using the BBPS [facility] Bowel                         Preparation Scale) with scores of: Right Colon = 3,                         Transverse Colon = 3 and Left Colon  = 3 (entire mucosa                         seen well with no residual staining, small fragments                         of stool or opaque liquid). The total BBPS score                         equals 9. Findings:      The perianal and digital rectal examinations were normal. Pertinent       negatives include normal sphincter tone and no palpable rectal lesions.      A diminutive polyp was found in the transverse colon. The polyp was       sessile. The polyp was removed with a cold biopsy forceps. Resection and       retrieval were complete.      Multiple diverticula were found in the sigmoid colon.      A few diverticula were found in the right colon.      Non-bleeding external hemorrhoids were found during retroflexion. The       hemorrhoids were medium-sized. Impression:            - One diminutive polyp in the transverse colon,                         removed with a cold biopsy forceps. Resected and                         retrieved.                        - Diverticulosis in the sigmoid colon.                        -  Diverticulosis in the right colon.                        - Non-bleeding external hemorrhoids. Recommendation:        - Discharge patient to home (with escort).                        - Cardiac diet today.                        - Continue present medications.                        - Await pathology results.                        - Resume Xarelto (rivaroxaban) today at prior dose.                         Refer to managing physician for further adjustment of                         therapy.                        - Repeat colonoscopy in 7-10 years for surveillance                         based on pathology results. Procedure Code(s):     --- Professional ---                        [phone], Colonoscopy, flexible; with biopsy, single or                         multiple Diagnosis Code(s):     --- Professional ---                        Z12.11, Encounter for screening  for malignant neoplasm                         of colon                        K63.5, Polyp of colon                        K64.4, Residual hemorrhoidal skin tags                        K57.30, Diverticulosis of large intestine without                         perforation or abscess without bleeding CPT copyright 2019 American Medical Association. All rights reserved. The codes documented in this report are preliminary and upon coder review may  be revised to meet current compliance requirements. Dr. Ulyess Mort Lin Landsman MD, MD 05/24/2020 8:55:01 AM This report has been signed electronically. Number of Addenda: 0 Note Initiated On: 05/24/2020 8:19 AM Scope Withdrawal Time: 0 hours 7 minutes 43 seconds  Total Procedure Duration: 0 hours 10 minutes 9 seconds  Estimated Blood Loss:  Estimated  blood loss: none.      [facility]

## 2020-05-24 NOTE — Anesthesia Postprocedure Evaluation (Signed)
Anesthesia Post Note  Patient: GRADYN SHEIN  Procedure(s) Performed: COLONOSCOPY WITH PROPOFOL (N/A ) ESOPHAGOGASTRODUODENOSCOPY (EGD) WITH PROPOFOL (N/A )  Patient location during evaluation: Endoscopy Anesthesia Type: General Level of consciousness: awake and alert Pain management: pain level controlled Vital Signs Assessment: post-procedure vital signs reviewed and stable Respiratory status: spontaneous breathing, nonlabored ventilation, respiratory function stable and patient connected to nasal cannula oxygen Cardiovascular status: blood pressure returned to baseline and stable Postop Assessment: no apparent nausea or vomiting Anesthetic complications: no   No complications documented.   Last Vitals:  Vitals:   05/24/20 0905 05/24/20 0915  BP: (!) 122/93 (!) 132/93  Pulse: (!) 105 97  Resp: 17 14  Temp:    SpO2: 100% 100%    Last Pain:  Vitals:   05/24/20 0915  TempSrc:   PainSc: 0-No pain                 Martha Clan

## 2020-05-24 NOTE — Op Note (Signed)
[facility] Gastroenterology Patient Name: Calvin Esparza Procedure Date: 05/24/2020 8:21 AM MRN: [id number] Account #: [id number] Date of Birth: 06/06/1960 Admit Type: Outpatient Age: 60 Room: [facility] ENDO ROOM 4 Gender: Male Note Status: Finalized Procedure:             Upper GI endoscopy Indications:           epidermoid metaplasia, esophagus, follow up Providers:             Lin Landsman MD, MD Referring MD:          Ocie Cornfield. Ouida Sills MD, MD (Referring MD) Medicines:             General Anesthesia Procedure:             Pre-Anesthesia Assessment:                        - Prior to the procedure, a History and Physical was                         performed, and patient medications and allergies were                         reviewed. The patient is competent. The risks and                         benefits of the procedure and the sedation options and                         risks were discussed with the patient. All questions                         were answered and informed consent was obtained.                         Patient identification and proposed procedure were                         verified by the physician, the nurse, the                         anesthesiologist, the anesthetist and the technician                         in the pre-procedure area in the procedure room in the                         endoscopy suite. Mental Status Examination: alert and                         oriented. Airway Examination: normal oropharyngeal                         airway and neck mobility. Respiratory Examination:                         clear to auscultation. CV Examination: irregularly                         irregular rate and  rhythm. Prophylactic Antibiotics:                         The patient does not require prophylactic antibiotics.                         Prior Anticoagulants: The patient has taken Xarelto                         (rivaroxaban), last  dose was 2 days prior to                         procedure. ASA Grade Assessment: III - A patient with                         severe systemic disease. After reviewing the risks and                         benefits, the patient was deemed in satisfactory                         condition to undergo the procedure. The anesthesia                         plan was to use general anesthesia. Immediately prior                         to administration of medications, the patient was                         re-assessed for adequacy to receive sedatives. The                         heart rate, respiratory rate, oxygen saturations,                         blood pressure, adequacy of pulmonary ventilation, and                         response to care were monitored throughout the                         procedure. The physical status of the patient was                         re-assessed after the procedure.                        After obtaining informed consent, the endoscope was                         passed under direct vision. Throughout the procedure,                         the patient's blood pressure, pulse, and oxygen                         saturations were monitored continuously. The Endoscope  was introduced through the mouth, and advanced to the                         second part of duodenum. The upper GI endoscopy was                         accomplished without difficulty. The patient tolerated                         the procedure well. Findings:      The duodenal bulb and second portion of the duodenum were normal.      Multiple localized linear erosions with no bleeding and no stigmata of       recent bleeding were found in the gastric antrum from ETOH use.      The cardia and gastric fundus were normal on retroflexion.      Localized mucosal changes characterized by linear white, raised mucosa       were found in the lower third of the esophagus, known  history of       epidermoid metaplasia. Biopsies were taken with a cold forceps for       histology. Impression:            - Normal duodenal bulb and second portion of the                         duodenum.                        - Erosive gastropathy with no bleeding and no stigmata                         of recent bleeding.                        - Linear white, raised mucosa mucosa in the esophagus.                         Biopsied. Recommendation:        - Await pathology results.                        - Proceed with colonoscopy as scheduled                        See colonoscopy report Procedure Code(s):     --- Professional ---                        [phone], Esophagogastroduodenoscopy, flexible,                         transoral; with biopsy, single or multiple Diagnosis Code(s):     --- Professional ---                        K31.89, Other diseases of stomach and duodenum CPT copyright 2019 American Medical Association. All rights reserved. The codes documented in this report are preliminary and upon coder review may  be revised to meet current compliance requirements. Dr. Ulyess Mort Lin Landsman MD, MD 05/24/2020 8:37:46 AM This report has been signed electronically. Number  of Addenda: 0 Note Initiated On: 05/24/2020 8:21 AM Estimated Blood Loss:  Estimated blood loss: none. Estimated blood loss: none.      [facility]

## 2020-05-24 NOTE — Transfer of Care (Signed)
Immediate Anesthesia Transfer of Care Note  Patient: Calvin Esparza  Procedure(s) Performed: COLONOSCOPY WITH PROPOFOL (N/A ) ESOPHAGOGASTRODUODENOSCOPY (EGD) WITH PROPOFOL (N/A )  Patient Location: PACU  Anesthesia Type:General  Level of Consciousness: awake and sedated  Airway & Oxygen Therapy: Patient Spontanous Breathing and Patient connected to nasal cannula oxygen  Post-op Assessment: Report given to RN and Post -op Vital signs reviewed and stable  Post vital signs: Reviewed and stable  Last Vitals:  Vitals Value Taken Time  BP    Temp    Pulse    Resp    SpO2      Last Pain:  Vitals:   05/24/20 0741  TempSrc: Temporal  PainSc: 0-No pain         Complications: No complications documented.

## 2020-05-24 NOTE — H&P (Signed)
Calvin Darby, MD [address]  [place]  [place], [place] [address]  Main: [phone]  Fax: [phone] Pager: [phone]  Primary Care Physician:  Calvin Ruths, MD Primary Gastroenterologist:  Dr. Cephas Esparza  Pre-Procedure History & Physical: HPI:  Calvin Esparza is a 60 y.o. male is here for an endoscopy and colonoscopy.   Past Medical History:  Diagnosis Date  . Alcohol abuse   . Chronic pain   . COVID-19    a. 02/2019  . HFrEF (heart failure with reduced ejection fraction) ([place])    a. 05/2019 Echo: EF 35-40%.  . History of medication noncompliance   . Longstanding persistent atrial fibrillation ([place])    a. Dx 03/2018; b. 05/2019 recurrent Afib in setting of PE. CHA2DS2VASc = 1-->Eliquis later changed to xarelto; b. 07/2019 s/p DCCV (150J); d. 08/2019 admit w/ AF RVR in settting of noncompliance - longstanding persistent afib since on bb/ccb rx.  Marland Kitchen NICM (nonischemic cardiomyopathy) ([place])    a. 05/2019 Echo: EF 35-40%, gr1 DD. Mod enlarged RV. Mildly dil LA. Mild to mod dil RA. Triv AI; b. 05/2019 MV: EF 32%, no ischemia.  . Pulmonary embolism ([place])    a. 05/2019 CTA Chest: mild amt of PE w/in a lower lobe branch of RPA; b. 08/2019 CTA Chest: Small PE in RML and RLL PA.    Past Surgical History:  Procedure Laterality Date  . CARDIOVERSION N/A 07/29/2019   Procedure: CARDIOVERSION;  Surgeon: Calvin Merritts, MD;  Location: [facility] ORS;  Service: Cardiovascular;  Laterality: N/A;  . ESOPHAGOGASTRODUODENOSCOPY (EGD) WITH PROPOFOL N/A 02/27/2020   Procedure: ESOPHAGOGASTRODUODENOSCOPY (EGD) WITH PROPOFOL;  Surgeon: Calvin Landsman, MD;  Location: [facility] ENDOSCOPY;  Service: Gastroenterology;  Laterality: N/A;    Prior to Admission medications   Medication Sig Start Date End Date Taking? Authorizing Provider  colchicine-probenecid 0.5-500 MG tablet Take 1 tablet by mouth 2 (two) times daily as needed (joint pain). 09/26/19  Yes [provider]   diltiazem (CARDIZEM CD) 180 MG 24 hr capsule Take 180 mg by mouth daily.   Yes [provider]  losartan (COZAAR) 25 MG tablet Take 1 tablet (25 mg total) by mouth daily. 02/28/20  Yes Wouk, Calvin Rud, MD  metoprolol succinate (TOPROL-XL) 100 MG 24 hr tablet Take 1 tablet (100 mg total) by mouth in the morning and at bedtime. Take with or immediately following a meal. 10/11/19  Yes Calvin Aloe, MD  omeprazole (PRILOSEC) 20 MG capsule Take 1 capsule (20 mg total) by mouth 2 (two) times daily before a meal. 02/27/20 02/26/21 Yes Wouk, Calvin Rud, MD  potassium chloride SA (KLOR-CON) 20 MEQ tablet Take 0.5 tablets (10 mEq total) by mouth daily. 05/02/20  Yes Calvin Gianotti, NP  torsemide (DEMADEX) 20 MG tablet Take 1 tablet (20 mg total) by mouth daily. 05/02/20  Yes Calvin Gianotti, NP  XARELTO 20 MG TABS tablet Take 1 tablet (20 mg total) by mouth daily with supper. 12/05/19   Calvin Merritts, MD    Allergies as of 05/01/2020  . (No Known Allergies)    Family History  Problem Relation Age of Onset  . Bone cancer Father   . Stroke Father   . Diabetes Sister     Social History   Socioeconomic History  . Marital status: Married    Spouse name: Not on file  . Number of children: Not on file  . Years of education: Not on file  . Highest  education level: Not on file  Occupational History  . Not on file  Tobacco Use  . Smoking status: Never Smoker  . Smokeless tobacco: Current User    Types: Chew  Vaping Use  . Vaping Use: Never used  Substance and Sexual Activity  . Alcohol use: Yes    Alcohol/week: 24.0 standard drinks    Types: 24 Cans of beer per week    Comment: 6 beers daily, weekend drinks more-  . Drug use: Not Currently  . Sexual activity: Not on file  Other Topics Concern  . Not on file  Social History Narrative   Lives locally w/ wife.  Does not routinely exercise.  Works full time in Development worker, international [employer].   Social Determinants of  Health   Financial Resource Strain: Not on file  Food Insecurity: Not on file  Transportation Needs: Not on file  Physical Activity: Not on file  Stress: Not on file  Social Connections: Not on file  Intimate Partner Violence: Not on file    Review of Systems: See HPI, otherwise negative ROS  Physical Exam: BP 119/86   Pulse 65   Temp (!) 97.1 F (36.2 C) (Temporal)   Resp 18   Ht 5\' 10"  (1.778 m)   Wt 90.7 kg   SpO2 100%   BMI 28.70 kg/m  General:   Alert,  pleasant and cooperative in NAD Head:  Normocephalic and atraumatic. Neck:  Supple; no masses or thyromegaly. Lungs:  Clear throughout to auscultation.    Heart:  Regular rate and rhythm. Abdomen:  Soft, nontender and nondistended. Normal bowel sounds, without guarding, and without rebound.   Neurologic:  Alert and  oriented x4;  grossly normal neurologically.  Impression/Plan: Oralia Esparza is here for an endoscopy and colonoscopy to be performed for cirrhosis of liver, colon cancer screening  Risks, benefits, limitations, and alternatives regarding  endoscopy and colonoscopy have been reviewed with the patient.  Questions have been answered.  All parties agreeable.   Calvin Sear, MD  05/24/2020, 8:21 AM

## 2020-05-24 NOTE — Anesthesia Procedure Notes (Signed)
Performed by: Cook-Martin, Feiga Nadel Pre-anesthesia Checklist: Patient identified, Emergency Drugs available, Suction available, Patient being monitored and Timeout performed Patient Re-evaluated:Patient Re-evaluated prior to induction Oxygen Delivery Method: Simple face mask Preoxygenation: Pre-oxygenation with 100% oxygen Induction Type: IV induction Airway Equipment and Method: Bite block Placement Confirmation: positive ETCO2 and CO2 detector       

## 2020-05-25 ENCOUNTER — Encounter: Payer: Self-pay | Admitting: Gastroenterology

## 2020-05-25 LAB — SURGICAL PATHOLOGY

## 2020-06-04 ENCOUNTER — Ambulatory Visit: Payer: 59 | Admitting: Cardiovascular Disease

## 2020-06-08 ENCOUNTER — Other Ambulatory Visit: Payer: Self-pay | Admitting: Family

## 2020-06-10 ENCOUNTER — Other Ambulatory Visit: Payer: Self-pay | Admitting: Obstetrics and Gynecology

## 2020-07-02 ENCOUNTER — Other Ambulatory Visit: Payer: Self-pay | Admitting: Family

## 2020-07-09 NOTE — Progress Notes (Signed)
No current cardiology Office Note  Date:  07/10/2020   ID:  CEVIN RUBINSTEIN, DOB 06-Feb-1961, MRN [id number]  PCP:  Calvin Ruths, MD   Chief Complaint  Patient presents with  . 6 month follow up     "doing well." Patient c/o cramping in legs from hips down with having difficulty walking. Medications reviewed by the patient verbally.     HPI:  Calvin Esparza is a 60 year old gentleman with past medical history of Alcohol abuse Chronic pain, knee pain Recently seen in the emergency room August 28, 2018 for chest pain, dizzy, atrial fibrillation Heart rate from 110 up to 130 bpm CTA showing PE lower lobe branch on the right March 2021 Atrial fibrillation March 2021 COVID infection December 2020 Who presents for follow-up of his likely permanent atrial fibrillation  Last seen in clinic September 2021 At that time was in atrial fibrillation In the hospital February 25, 2020 with atrial fibrillation with RVR rate 177 bpm Secondary to dysphagia has not been taking his anticoagulation or any of his pills Had EGD showing multiple gastric erosions, portal gastropathy Started on PPI twice daily  Seen by one of our providers March 01, 2020 Was on metoprolol succinate 100 twice daily and diltiazem 180 daily, Xarelto   working in Architect, for cement all day, often in the hot basement, 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 fib with  RVR Admission to the hospital Heatstroke, dehydration Was given IV fluids, heart rate improved, felt better, no significant medication changes were made  Seen in clinic in October 14, 2019, atrial fibrillation at that time  Seen in emergency room December 15, 2019 for shortness of breath  not taking his diltiazem for several days out of fear of interaction with colchicine blood pressure is elevated 160/113 pulse rate 119 was given a dose of IV Lasix and a dose of diltiazem, discharged home  Covid infection December 2020   PMH:   has a past medical history of Alcohol abuse, Chronic pain, COVID-19, HFrEF (heart failure with reduced ejection fraction) ([name]), History of medication noncompliance, Longstanding persistent atrial fibrillation ([name]), NICM (nonischemic cardiomyopathy) ([name]), and Pulmonary embolism ([name]).  PSH:    Past Surgical History:  Procedure Laterality Date  . CARDIOVERSION N/A 07/29/2019   Procedure: CARDIOVERSION;  Surgeon: Minna Merritts, MD;  Location: [facility] ORS;  Service: Cardiovascular;  Laterality: N/A;  . COLONOSCOPY WITH PROPOFOL N/A 05/24/2020   Procedure: COLONOSCOPY WITH PROPOFOL;  Surgeon: Lin Landsman, MD;  Location: [facility] ENDOSCOPY;  Service: Gastroenterology;  Laterality: N/A;  . ESOPHAGOGASTRODUODENOSCOPY (EGD) WITH PROPOFOL N/A 02/27/2020   Procedure: ESOPHAGOGASTRODUODENOSCOPY (EGD) WITH PROPOFOL;  Surgeon: Lin Landsman, MD;  Location: [facility] ENDOSCOPY;  Service: Gastroenterology;  Laterality: N/A;  . ESOPHAGOGASTRODUODENOSCOPY (EGD) WITH PROPOFOL N/A 05/24/2020   Procedure: ESOPHAGOGASTRODUODENOSCOPY (EGD) WITH PROPOFOL;  Surgeon: Lin Landsman, MD;  Location: [facility] ENDOSCOPY;  Service: Gastroenterology;  Laterality: N/A;    Current Outpatient Medications  Medication Sig Dispense Refill  .  colchicine-probenecid 0.5-500 MG tablet Take 1 tablet by mouth 2 (two) times daily as needed (joint pain).    Marland Kitchen diltiazem (CARDIZEM CD) 180 MG 24 hr  capsule Take 180 mg by mouth daily.    Marland Kitchen losartan (COZAAR) 25 MG tablet Take 1 tablet (25 mg total) by mouth daily. 30 tablet 1  . metoprolol succinate (TOPROL-XL) 100 MG 24 hr tablet Take 1 tablet (100 mg total) by mouth in the morning and at bedtime. Take with or immediately following a meal. 180 tablet 3  . omeprazole (PRILOSEC) 20 MG capsule Take 1 capsule (20 mg total) by mouth 2 (two) times daily before a meal. 60 capsule 1  . potassium chloride SA (KLOR-CON) 20 MEQ tablet Take 0.5 tablets (10 mEq total) by mouth daily. 90 tablet 3  . torsemide (DEMADEX) 20 MG tablet Take 2 tablets (40 mg total) by mouth daily. MUST MAKE appt for further refills 60 tablet 0  . XARELTO 20 MG TABS tablet Take 1 tablet (20 mg total) by mouth daily with supper. 90 tablet 2   No current facility-administered medications for this visit.    Allergies:   Patient has no known allergies.   Social History:  The patient  reports that he has never smoked. His smokeless tobacco use includes chew. He reports current alcohol use of about 24.0 standard drinks of alcohol per week. He reports previous drug use.   Family History:   family history includes Bone cancer in his father; Diabetes in his sister; Stroke in his father.    Review of Systems: Review of Systems  Constitutional: Negative.   HENT: Negative.   Eyes: Negative.   Respiratory: Negative.   Cardiovascular: Negative.   Gastrointestinal: Negative.   Musculoskeletal: Positive for myalgias.  Neurological: Negative.   Psychiatric/Behavioral: Negative.   All other systems reviewed and are negative.   PHYSICAL EXAM: VS:  BP 120/86 (BP Location: Left Arm, Patient Position: Sitting, Cuff Size: Normal)   Pulse 99   Ht 5\' 10"  (1.778 m)   Wt 208 lb 4 oz (94.5 kg)   SpO2 98%   BMI 29.88 kg/m  , BMI Body mass index is 29.88 kg/m. Constitutional:  oriented to person, place, and time. No distress.  HENT:  Head: Grossly normal Eyes:  no discharge. No  scleral icterus.  Neck: No JVD, no carotid bruits  Cardiovascular: Irregularly irregular no murmurs appreciated Pulmonary/Chest: Clear to auscultation bilaterally, no wheezes or rails Abdominal: Soft.  no distension.  no tenderness.  Musculoskeletal: Normal range of motion Neurological:  normal muscle tone. Coordination normal. No atrophy Skin: Skin warm and dry Psychiatric: normal affect, pleasant  Recent Labs: 11/28/2019: Magnesium 1.9 02/25/2020: B Natriuretic Peptide 815.5; TSH 1.449 05/01/2020: ALT 18; Hemoglobin 14.6; Platelets 219 05/15/2020: BUN 19; Creatinine, Ser 1.28; Potassium 4.2; Sodium 140    Lipid Panel No results found for: CHOL, HDL, LDLCALC, TRIG    Wt Readings from Last 3 Encounters:  07/10/20 208 lb 4 oz (94.5 kg)  05/24/20 200 lb (90.7 kg)  05/01/20 205 lb (93 kg)     ASSESSMENT AND PLAN:  Problem List Items Addressed This Visit      Cardiology Problems   NICM (nonischemic cardiomyopathy) ([place])   Relevant Orders   EKG 12-Lead   Chronic combined systolic and diastolic CHF (congestive heart failure) ([place]) - Primary   Relevant Orders   EKG 12-Lead   Persistent atrial fibrillation (HCC)   Relevant Orders   EKG 12-Lead    Other  Visit Diagnoses    HFrEF (heart failure with reduced ejection fraction) (HCC)       History of pulmonary embolism       ETOH abuse         Atrial fibrillation, persistent Continue diltiazem 180 mg daily with metoprolol succinate 100 twice daily,  On Xarelto  Essential hypertension Blood pressure is well controlled on today's visit. No changes made to the medications.  Pulmonary embolism On anticoagulation On Xarelto  Cardiomyopathy  avoid alcohol In atrial fibrillation, permanent likely  Muscle cramps: Avoid dehydration, hold torsemide on hot days in the sun   Total encounter time more than 25 minutes  Greater than 50% was spent in counseling and coordination of care with the patient    Signed, Esmond Plants,  M.D., Ph.D. [place], [place]

## 2020-07-10 ENCOUNTER — Ambulatory Visit (INDEPENDENT_AMBULATORY_CARE_PROVIDER_SITE_OTHER): Payer: 59 | Admitting: Cardiovascular Disease

## 2020-07-10 ENCOUNTER — Encounter: Payer: Self-pay | Admitting: Cardiovascular Disease

## 2020-07-10 ENCOUNTER — Other Ambulatory Visit: Payer: Self-pay

## 2020-07-10 VITALS — BP 120/86 | HR 99 | Ht 70.0 in | Wt 208.2 lb

## 2020-07-10 DIAGNOSIS — I5042 Chronic combined systolic (congestive) and diastolic (congestive) heart failure: Secondary | ICD-10-CM | POA: Diagnosis not present

## 2020-07-10 DIAGNOSIS — I4819 Other persistent atrial fibrillation: Secondary | ICD-10-CM | POA: Diagnosis not present

## 2020-07-10 DIAGNOSIS — I5020 Unspecified systolic (congestive) heart failure: Secondary | ICD-10-CM | POA: Diagnosis not present

## 2020-07-10 DIAGNOSIS — I428 Other cardiomyopathies: Secondary | ICD-10-CM | POA: Diagnosis not present

## 2020-07-10 DIAGNOSIS — Z86711 Personal history of pulmonary embolism: Secondary | ICD-10-CM

## 2020-07-10 DIAGNOSIS — F1010 Alcohol abuse, uncomplicated: Secondary | ICD-10-CM

## 2020-07-10 DIAGNOSIS — Z13220 Encounter for screening for lipoid disorders: Secondary | ICD-10-CM

## 2020-07-10 NOTE — Patient Instructions (Addendum)
Coupon for xarelto  Medication Instructions:  No changes-continue your current medications  Coupon to register for $10 co-pay card for Xarelto Free samples of Xarelto given (4 bottles)  Lot :[id number]  Exp: 04/24  If you need a refill on your cardiac medications before your next appointment, please call your pharmacy.    Lab work: CMP, Lipids  We will call with abnormal results   Testing/Procedures: No new testing needed   Follow-Up:  . You will need a follow up appointment in 6 months  . Providers on your designated Care Team:   . Murray Hodgkins, NP . Christell Faith, PA-C . Marrianne Mood, PA-C   COVID-19 Vaccine Information can be found at: ShippingScam.co.uk For questions related to vaccine distribution or appointments, please email [email or web address] or call [phone].

## 2020-07-11 LAB — COMPREHENSIVE METABOLIC PANEL
ALT: 23 IU/L (ref 0–44)
AST: 30 IU/L (ref 0–40)
Albumin/Globulin Ratio: 1.5 (ref 1.2–2.2)
Albumin: 4.1 g/dL (ref 3.8–4.9)
Alkaline Phosphatase: 62 IU/L (ref 44–121)
BUN/Creatinine Ratio: 14 (ref 9–20)
BUN: 14 mg/dL (ref 6–24)
Bilirubin Total: <0.2 mg/dL (ref 0.0–1.2)
CO2: 23 mmol/L (ref 20–29)
Calcium: 8.8 mg/dL (ref 8.7–10.2)
Chloride: 106 mmol/L (ref 96–106)
Creatinine, Ser: 1.01 mg/dL (ref 0.76–1.27)
Globulin, Total: 2.8 g/dL (ref 1.5–4.5)
Glucose: 105 mg/dL — ABNORMAL HIGH (ref 65–99)
Potassium: 4.6 mmol/L (ref 3.5–5.2)
Sodium: 143 mmol/L (ref 134–144)
Total Protein: 6.9 g/dL (ref 6.0–8.5)
eGFR: 86 mL/min/{1.73_m2} (ref >59)

## 2020-07-11 LAB — LIPID PANEL
Chol/HDL Ratio: 3.7 ratio (ref 0.0–5.0)
Cholesterol, Total: 209 mg/dL — ABNORMAL HIGH (ref 100–199)
HDL: 57 mg/dL (ref >39)
LDL Chol Calc (NIH): 133 mg/dL — ABNORMAL HIGH (ref 0–99)
Triglycerides: 107 mg/dL (ref 0–149)
VLDL Cholesterol Cal: 19 mg/dL (ref 5–40)

## 2020-07-16 ENCOUNTER — Other Ambulatory Visit: Payer: Self-pay

## 2020-07-16 MED ORDER — LOSARTAN POTASSIUM 25 MG PO TABS: 25.0000 mg | ORAL_TABLET | Freq: Every day | ORAL | 1 refills | Status: DC

## 2020-07-16 NOTE — Telephone Encounter (Signed)
*  STAT* If patient is at the pharmacy, call can be transferred to refill team.   1. Which medications need to be refilled? (please list name of each medication and dose if known) Losartan  2. Which pharmacy/location (including street and city if local pharmacy) is medication to be sent to? [facility]  3. Do they need a 30 day or 90 day supply? [place]

## 2020-07-17 ENCOUNTER — Telehealth: Payer: Self-pay

## 2020-07-17 MED ORDER — ROSUVASTATIN CALCIUM 5 MG PO TABS: 5.0000 mg | ORAL_TABLET | Freq: Every day | ORAL | 3 refills | Status: DC

## 2020-07-17 NOTE — Telephone Encounter (Signed)
Attempted to reach out to pt, unable to get in contact, LDM on VM (DPR approved) advised on recent labs results, Dr. Rockey Situ advised  "Stable lab work (BMP) Cholesterol up a little bit, 200  We can ask if he would like Crestor 5 mg daily to treat cholesterol"  Advised if wanting to try Crestor, [facility], advised on diet and exercise to help control cholesterol levels as well. Can call back with any concerns or questions regarding new medication.

## 2020-07-31 ENCOUNTER — Ambulatory Visit: Payer: 59 | Admitting: Gastroenterology

## 2020-11-28 ENCOUNTER — Telehealth: Payer: Self-pay | Admitting: Cardiovascular Disease

## 2020-11-28 NOTE — Telephone Encounter (Signed)
Pt c/o swelling: STAT is pt has developed SOB within 24 hours  If swelling, where is the swelling located? Tightness chest abdominal area   How much weight have you gained and in what time span? Not sure   Have you gained 3 pounds in a day or 5 pounds in a week? yes  Do you have a log of your daily weights (if so, list)? 200-210 does not weigh same time everyday  Are you currently taking a fluid pill? Yes   Are you currently SOB? Increased sob with weight gain   Have you traveled recently? no

## 2020-11-28 NOTE — Telephone Encounter (Signed)
Was able to reach out to Mrs. Hannold regarding her husband, he was out checking cows, wife was able to call husband on cell phone and have him be apart of the conversation. Wife concern as Mr. Plude has been more Sob then lately, more fatigue, decrease appetite, chest tightness with abd bloating/swelling and swelling to his feel/ankles. Also reports fluctuation in weight, although not weighting at same time everyday, not dry weights.  Possible 5 lbs weight gain in a week No BP/HR to report  Pt has been taking torsemide 20 mg daily, noted on his med list and from last OV he is suppose to be taking 40 mg daily. Advised to increase his dose to 40 mg to see if that help with SOB, weight gain and swelling.   Suggested pt to take his weight daily, dry weights; weight himself daily in the morning after getting up and voiding and record these weights. Monitor BP and Hr if possible as well. Decrease salt intake as well.  Has an appt 9/19 with Cadence Furth, PA-C advised to bring in weight log and BP/HR log as well. Try the 40 mg torsemide daily to see if any improvement.  Strongly advised if SOB get's worse, Mr. Eversman is not able to get out and check cows d/t SOB, CO, weakness, or fatigue, then advise dnot to weight until appt on Monday, seek the ED to be evaluated. Mrs Jeffress verbalized understanding and will make sure her husband understands that.   Both are very thankful for the call and information. Otherwise all questions or concerns were address and no additional concerns at this time. Agreeable to plan, will call back for anything further or see at Mr. Shiao appt on Monday, 9/19.

## 2020-11-29 ENCOUNTER — Telehealth: Payer: Self-pay

## 2020-11-29 NOTE — Telephone Encounter (Signed)
-----   Message from Lin Landsman, MD sent at 11/28/2020 12:58 PM EDT ----- Recommend referral to [facility] to see gastroenterologist, esophagus expert Dr Adria Devon or Dr Eston Mould.  Dx: Epidermoid metaplasia of the esophagus  Thanks RV

## 2020-11-29 NOTE — Telephone Encounter (Signed)
Placed referral and faxed to [facility]

## 2020-12-02 NOTE — Progress Notes (Signed)
Cardiology Office Note:    Date:  12/03/2020   ID:  [name], DOB 06/28/1960, MRN [id number]  PCP:  [name], MD  [facility] Cardiologist:  Dr. Arvid Right [facility] Electrophysiologist:  None   Referring MD: [name], MD   Chief Complaint: lower leg edema/SOB  History of Present Illness:    Calvin Esparza is a 60 y.o. male with a hx of Persistent afib s/p prior cardioversion in May 2021 with subsequent reversion to afib, alcohol abuse, chronic pain, NICM/HFrEF (EF 35-40%), PE March 2021 and again June 2021, COVID-19 infection December 2020 who presents for follow-up.   Hospitalized September 2021 for afib RVR with rate 177, secondary to dysphagia. He had not been taking a/c or his pills. He had EGD showing multiple erosions, portal gastropaphy. Started on PPI twice daily.   Myoview 09/07/20 showed no significant ischemia, mod HK, EF 32%, low risk scan  Seen 07/10/20 and was in afib 99bpm. No changes were made  Wife called 11/28/20 reporting volume overload and he was instructed to take torsemide '40mg'$  daily.   Today, the patient reports 1 month of sob and swelling. Has been getting worse. Has chronic orthopnea for a year, seems it has been worse. He is taking Torsemide '40mg'$  daily for the last week, 5/7 days. He is not taking torsemide '40mg'$  daily bc in the past this caused worsening joint pain/gout. Breathing is worse on exertion. No chest pain. EKG shows rate controlled afib. Since taking extra lasix volume status has improved.   Past Medical History:  Diagnosis Date   Alcohol abuse    Chronic pain    COVID-19    a. 02/2019   HFrEF (heart failure with reduced ejection fraction) ([place])    a. 05/2019 Echo: EF 35-40%.   History of medication noncompliance    Longstanding persistent atrial fibrillation ([place])    a. Dx 03/2018; b. 05/2019 recurrent Afib in setting of PE. CHA2DS2VASc = 1-->Eliquis later changed to xarelto; b. 07/2019 s/p DCCV (150J); d.  08/2019 admit w/ AF RVR in settting of noncompliance - longstanding persistent afib since on bb/ccb rx.   NICM (nonischemic cardiomyopathy) ([place])    a. 05/2019 Echo: EF 35-40%, gr1 DD. Mod enlarged RV. Mildly dil LA. Mild to mod dil RA. Triv AI; b. 05/2019 MV: EF 32%, no ischemia.   Pulmonary embolism ([place])    a. 05/2019 CTA Chest: mild amt of PE w/in a lower lobe branch of RPA; b. 08/2019 CTA Chest: Small PE in RML and RLL PA.    Past Surgical History:  Procedure Laterality Date   CARDIOVERSION N/A 07/29/2019   Procedure: CARDIOVERSION;  Surgeon: Minna Merritts, MD;  Location: [facility] ORS;  Service: Cardiovascular;  Laterality: N/A;   COLONOSCOPY WITH PROPOFOL N/A 05/24/2020   Procedure: COLONOSCOPY WITH PROPOFOL;  Surgeon: Lin Landsman, MD;  Location: [facility] ENDOSCOPY;  Service: Gastroenterology;  Laterality: N/A;   ESOPHAGOGASTRODUODENOSCOPY (EGD) WITH PROPOFOL N/A 02/27/2020   Procedure: ESOPHAGOGASTRODUODENOSCOPY (EGD) WITH PROPOFOL;  Surgeon: Lin Landsman, MD;  Location: [facility] ENDOSCOPY;  Service: Gastroenterology;  Laterality: N/A;   ESOPHAGOGASTRODUODENOSCOPY (EGD) WITH PROPOFOL N/A 05/24/2020   Procedure: ESOPHAGOGASTRODUODENOSCOPY (EGD) WITH PROPOFOL;  Surgeon: Lin Landsman, MD;  Location: [facility] ENDOSCOPY;  Service: Gastroenterology;  Laterality: N/A;    Current Medications: Current Meds  Medication Sig   colchicine-probenecid 0.5-500 MG tablet Take 1 tablet by mouth 2 (two) times daily as needed (joint pain).   losartan (COZAAR) 25 MG tablet Take  1 tablet (25 mg total) by mouth daily.   meloxicam (MOBIC) 15 MG tablet Take 15 mg by mouth daily as needed.   metoprolol (TOPROL-XL) 200 MG 24 hr tablet Take 1 tablet (200 mg total) by mouth daily.   omeprazole (PRILOSEC) 20 MG capsule Take 1 capsule (20 mg total) by mouth 2 (two) times daily before a meal.   potassium chloride SA (KLOR-CON) 20 MEQ tablet Take 0.5 tablets (10 mEq total) by mouth daily.   rosuvastatin  (CRESTOR) 5 MG tablet Take 1 tablet (5 mg total) by mouth daily.   XARELTO 20 MG TABS tablet Take 1 tablet (20 mg total) by mouth daily with supper.   [DISCONTINUED] diltiazem (CARDIZEM CD) 180 MG 24 hr capsule Take 180 mg by mouth daily.   [DISCONTINUED] metoprolol succinate (TOPROL-XL) 100 MG 24 hr tablet Take 1 tablet (100 mg total) by mouth in the morning and at bedtime. Take with or immediately following a meal. (Patient taking differently: Take 100 mg by mouth daily. Take with or immediately following a meal.)   [DISCONTINUED] torsemide (DEMADEX) 20 MG tablet Take 2 tablets (40 mg total) by mouth daily. MUST MAKE appt for further refills     Allergies:   Patient has no known allergies.   Social History   Socioeconomic History   Marital status: Married    Spouse name: Not on file   Number of children: Not on file   Years of education: Not on file   Highest education level: Not on file  Occupational History   Not on file  Tobacco Use   Smoking status: Never   Smokeless tobacco: Current    Types: Chew  Vaping Use   Vaping Use: Never used  Substance and Sexual Activity   Alcohol use: Yes    Alcohol/week: 24.0 standard drinks    Types: 24 Cans of beer per week    Comment: 6 beers daily, weekend drinks more-   Drug use: Not Currently   Sexual activity: Not on file  Other Topics Concern   Not on file  Social History Narrative   Lives locally w/ wife.  Does not routinely exercise.  Works full time in Development worker, international aid.   Social Determinants of Health   Financial Resource Strain: Not on file  Food Insecurity: Not on file  Transportation Needs: Not on file  Physical Activity: Not on file  Stress: Not on file  Social Connections: Not on file     Family History: The patient's family history includes Bone cancer in his father; Diabetes in his sister; Stroke in his father.  ROS:   Please see the history of present illness.     All other systems reviewed and are  negative.  EKGs/Labs/Other Studies Reviewed:    The following studies were reviewed today:  Myoview Lexiscan 05/2019 Narrative & Impression  Pharmacological myocardial perfusion imaging study with no significant ischemia Moderate global hypokinesis, EF estimated at 32% No EKG changes concerning for ischemia at peak stress or in recovery. Baseline EKG with atrial fibrillation, rate 100 bpm Low risk scan     Signed, Esmond Plants, MD, Ph.D [facility]    Echo 05/2019  1. Left ventricular ejection fraction, by estimation, is 35 to 40%. The  left ventricle has moderately decreased function. The left ventricle  demonstrates global hypokinesis. There is mild left ventricular  hypertrophy. Left ventricular diastolic  parameters are consistent with Grade I diastolic dysfunction (impaired  relaxation).   2. Right ventricular systolic function is  mildly reduced. The right  ventricular size is moderately enlarged.   3. Left atrial size was mildly dilated.   4. Right atrial size was mild to moderately dilated.   5. The mitral valve is grossly normal. No evidence of mitral valve  regurgitation.   6. The aortic valve is tricuspid. Aortic valve regurgitation is trivial.   7. The inferior vena cava is normal in size with greater than 50%  respiratory variability, suggesting right atrial pressure of 3 mmHg.   EKG:  EKG is  ordered today.  The ekg ordered today demonstrates Afib, 81bpm, nonspecific ST/T wave changes  Recent Labs: 02/25/2020: B Natriuretic Peptide 815.5; TSH 1.449 05/01/2020: Hemoglobin 14.6; Platelets 219 07/10/2020: ALT 23; BUN 14; Creatinine, Ser 1.01; Potassium 4.6; Sodium 143  Recent Lipid Panel    Component Value Date/Time   CHOL 209 (H) 07/10/2020 0845   TRIG 107 07/10/2020 0845   HDL 57 07/10/2020 0845   CHOLHDL 3.7 07/10/2020 0845   LDLCALC 133 (H) 07/10/2020 0845    Physical Exam:    VS:  BP 112/80 (BP Location: Left Arm, Patient Position: Sitting, Cuff  Size: Normal)   Pulse 81   Ht '5\' 10"'$  (1.778 m)   Wt 206 lb 4 oz (93.6 kg)   SpO2 98%   BMI 29.59 kg/m     Wt Readings from Last 3 Encounters:  12/03/20 206 lb 4 oz (93.6 kg)  07/10/20 208 lb 4 oz (94.5 kg)  05/24/20 200 lb (90.7 kg)     GEN:  Well nourished, well developed in no acute distress HEENT: Normal NECK: No JVD; No carotid bruits LYMPHATICS: No lymphadenopathy CARDIAC: Irreg Irreg, no murmurs, rubs, gallops RESPIRATORY:  Clear to auscultation without rales, wheezing or rhonchi  ABDOMEN: Soft, non-tender, non-distended MUSCULOSKELETAL:  trace edema; No deformity  SKIN: Warm and dry NEUROLOGIC:  Alert and oriented x 3 PSYCHIATRIC:  Normal affect   ASSESSMENT:    1. NICM (nonischemic cardiomyopathy) ([place])   2. Persistent atrial fibrillation ([place])   3. Essential hypertension   4. Alcohol abuse   5. 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 He is on Losartan and toprol. Reports LLE and SOB for the last month. Torsemide increased to '40mg'$  daily however he only took it 5/7 days with h/o worsening gout/joint pain on this dose. He has trace lower leg edema on exam. I will check BNP and BMET. Recommend he stop alcohol use as this is worsening volume status and gout. Recommend he take Torsemide 40 mg every other day/PRN. Also decrease salt intake, can try compression socks and leg elevation. At follow-up can consider transition from Losartan to [facility].    Permanent Afib He is in rate controlled afib with heart rate 81bpm. Stop diltiazem with low EF. Increase Toprol to '200mg'$  daily. Continue a/c with Xarelto.   HTN BP well controlled today. Stop diltiazem and increase Toprol as above.   H/o DVT/PE He reports compliance with Xarelto.  Alcohol use Has decreased alcohol to 20 beers in a month. Complete cessation was recommended  Disposition:  Follow up in 1 month(s) with MD/APP   Signed, Kaliann Coryell Ninfa Meeker, PA-C  12/03/2020 12:54 PM    [facility]

## 2020-12-03 ENCOUNTER — Other Ambulatory Visit: Payer: Self-pay

## 2020-12-03 ENCOUNTER — Ambulatory Visit (INDEPENDENT_AMBULATORY_CARE_PROVIDER_SITE_OTHER): Payer: 59 | Admitting: Medical

## 2020-12-03 ENCOUNTER — Encounter: Payer: Self-pay | Admitting: Medical

## 2020-12-03 VITALS — BP 112/80 | HR 81 | Ht 70.0 in | Wt 206.2 lb

## 2020-12-03 DIAGNOSIS — F1010 Alcohol abuse, uncomplicated: Secondary | ICD-10-CM

## 2020-12-03 DIAGNOSIS — I428 Other cardiomyopathies: Secondary | ICD-10-CM | POA: Diagnosis not present

## 2020-12-03 DIAGNOSIS — I5022 Chronic systolic (congestive) heart failure: Secondary | ICD-10-CM

## 2020-12-03 DIAGNOSIS — I4819 Other persistent atrial fibrillation: Secondary | ICD-10-CM

## 2020-12-03 DIAGNOSIS — I10 Essential (primary) hypertension: Secondary | ICD-10-CM | POA: Diagnosis not present

## 2020-12-03 MED ORDER — TORSEMIDE 20 MG PO TABS: ORAL_TABLET | ORAL | 0 refills | Status: DC

## 2020-12-03 MED ORDER — METOPROLOL SUCCINATE ER 200 MG PO TB24: 200.0000 mg | ORAL_TABLET | Freq: Every day | ORAL | 1 refills | Status: DC

## 2020-12-03 NOTE — Patient Instructions (Addendum)
Medication Instructions:  - Your physician has recommended you make the following change in your medication:   1) STOP Diltiazem  2) INCREASE Metoprolol Succinate to 200 mg: - take 1 tablet by mouth ONCE daily   3) CHANGE Torsemide 20 mg: - take 2 tablets (40 mg) by mouth once EVERY OTHER day  *If you need a refill on your cardiac medications before your next appointment, please call your pharmacy*   Lab Work: - Your physician recommends that you have lab work today: BMP/ BNP  If you have labs (blood work) drawn today and your tests are completely normal, you will receive your results only by: [place] (if you have MyChart) OR A paper copy in the mail If you have any lab test that is abnormal or we need to change your treatment, we will call you to review the results.   Testing/Procedures: - none ordered   Follow-Up: At [facility], you and your health needs are our priority.  As part of our continuing mission to provide you with exceptional heart care, we have created designated Provider Care Teams.  These Care Teams include your primary Cardiologist (physician) and Advanced Practice Providers (APPs -  Physician Assistants and Nurse Practitioners) who all work together to provide you with the care you need, when you need it.  We recommend signing up for the patient portal called "MyChart".  Sign up information is provided on this After Visit Summary.  MyChart is used to connect with patients for Virtual Visits (Telemedicine).  Patients are able to view lab/test results, encounter notes, upcoming appointments, etc.  Non-urgent messages can be sent to your provider as well.   To learn more about what you can do with MyChart, go to [email or web address].    Your next appointment:   As scheduled   The format for your next appointment:   In Person  Provider:   Ida Rogue, MD   Other Instructions N/a

## 2020-12-04 LAB — BASIC METABOLIC PANEL
BUN/Creatinine Ratio: 18 (ref 9–20)
BUN: 22 mg/dL (ref 6–24)
CO2: 23 mmol/L (ref 20–29)
Calcium: 9.6 mg/dL (ref 8.7–10.2)
Chloride: 103 mmol/L (ref 96–106)
Creatinine, Ser: 1.25 mg/dL (ref 0.76–1.27)
Glucose: 108 mg/dL — ABNORMAL HIGH (ref 65–99)
Potassium: 4.4 mmol/L (ref 3.5–5.2)
Sodium: 140 mmol/L (ref 134–144)
eGFR: 67 mL/min/{1.73_m2} (ref >59)

## 2020-12-04 LAB — BRAIN NATRIURETIC PEPTIDE: BNP: 275.7 pg/mL — ABNORMAL HIGH (ref 0.0–100.0)

## 2020-12-08 ENCOUNTER — Other Ambulatory Visit: Payer: Self-pay | Admitting: Cardiovascular Disease

## 2021-01-07 ENCOUNTER — Ambulatory Visit: Payer: 59 | Admitting: Cardiovascular Disease

## 2021-01-07 NOTE — Progress Notes (Deleted)
No current cardiology Office Note  Date:  01/07/2021   ID:  Calvin Esparza, DOB 08/21/1960, MRN [id number]  PCP:  Calvin Ruths, MD   No chief complaint on file.   HPI:  [name] is a 60 year old gentleman with past medical history of Alcohol abuse Chronic pain, knee pain Recently seen in the emergency room August 28, 2018 for chest pain, dizzy, atrial fibrillation Heart rate from 110 up to 130 bpm CTA showing PE lower lobe branch on the right March 2021 Atrial fibrillation March 2021 COVID infection December 2020 Who presents for follow-up of his likely permanent atrial fibrillation  Last seen in clinic September 2021 At that time was in atrial fibrillation In the hospital February 25, 2020 with atrial fibrillation with RVR rate 177 bpm Secondary to dysphagia has not been taking his anticoagulation or any of his pills Had EGD showing multiple gastric erosions, portal gastropathy Started on PPI twice daily  Seen by one of our providers March 01, 2020 Was on metoprolol succinate 100 twice daily and diltiazem 180 daily, Xarelto   working in Architect, for cement all day, often in the hot basement, 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 fib with RVR Admission to the hospital Heatstroke, dehydration Was given IV fluids, heart rate improved, felt better, no significant medication changes were made  Seen in clinic in October 14, 2019, atrial fibrillation at that time  Seen in emergency room December 15, 2019 for shortness of breath  not taking his diltiazem for several days out of fear of interaction with colchicine blood pressure is elevated 160/113 pulse rate 119 was given a dose of IV Lasix and a dose of diltiazem, discharged home  Covid infection December 2020   PMH:   has a past medical history of Alcohol abuse, Chronic pain, COVID-19, HFrEF (heart failure with reduced ejection fraction) ([place]), History of medication noncompliance, Longstanding persistent atrial fibrillation ([place]), NICM (nonischemic cardiomyopathy) ([place]), and Pulmonary embolism ([place]).  PSH:    Past Surgical History:  Procedure Laterality Date   CARDIOVERSION N/A 07/29/2019   Procedure: CARDIOVERSION;  Surgeon: Minna Merritts, MD;  Location: [facility] ORS;  Service: Cardiovascular;  Laterality: N/A;   COLONOSCOPY WITH PROPOFOL N/A 05/24/2020   Procedure: COLONOSCOPY WITH PROPOFOL;  Surgeon: Lin Landsman, MD;  Location: [facility] ENDOSCOPY;  Service: Gastroenterology;  Laterality: N/A;   ESOPHAGOGASTRODUODENOSCOPY (EGD) WITH PROPOFOL N/A 02/27/2020   Procedure: ESOPHAGOGASTRODUODENOSCOPY (EGD) WITH PROPOFOL;  Surgeon: Lin Landsman, MD;  Location: [facility] ENDOSCOPY;  Service: Gastroenterology;  Laterality: N/A;   ESOPHAGOGASTRODUODENOSCOPY (EGD) WITH PROPOFOL N/A 05/24/2020   Procedure: ESOPHAGOGASTRODUODENOSCOPY (EGD) WITH PROPOFOL;  Surgeon: Lin Landsman, MD;  Location: [facility] ENDOSCOPY;  Service: Gastroenterology;  Laterality: N/A;    Current Outpatient Medications  Medication Sig Dispense Refill   colchicine-probenecid 0.5-500 MG tablet Take 1 tablet by mouth 2 (two) times daily as needed (joint pain).     losartan (COZAAR) 25 MG tablet Take 1 tablet (25 mg  total) by mouth daily. 90 tablet 1   meloxicam (MOBIC) 15 MG tablet Take 15 mg by mouth daily as needed.     metoprolol (TOPROL-XL) 200 MG 24 hr tablet Take 1 tablet (200 mg  total) by mouth daily. 90 tablet 1   omeprazole (PRILOSEC) 20 MG capsule Take 1 capsule (20 mg total) by mouth 2 (two) times daily before a meal. 60 capsule 1   potassium chloride SA (KLOR-CON M20) 20 MEQ tablet Take 0.5 tablets (10 mEq total) by mouth daily. 45 tablet 0   rosuvastatin (CRESTOR) 5 MG tablet Take 1 tablet (5 mg total) by mouth daily. 90 tablet 3   torsemide (DEMADEX) 20 MG tablet Take 2 tablets (40 mg) by mouth once EVERY OTHER day 60 tablet 0   XARELTO 20 MG TABS tablet Take 1 tablet (20 mg total) by mouth daily with supper. 90 tablet 2   No current facility-administered medications for this visit.    Allergies:   Patient has no known allergies.   Social History:  The patient  reports that he has never smoked. His smokeless tobacco use includes chew. He reports current alcohol use of about 24.0 standard drinks per week. He reports that he does not currently use drugs.   Family History:   family history includes Bone cancer in his father; Diabetes in his sister; Stroke in his father.    Review of Systems: Review of Systems  Constitutional: Negative.   HENT: Negative.    Eyes: Negative.   Respiratory: Negative.    Cardiovascular: Negative.   Gastrointestinal: Negative.   Musculoskeletal:  Positive for myalgias.  Neurological: Negative.   Psychiatric/Behavioral: Negative.    All other systems reviewed and are negative.  PHYSICAL EXAM: VS:  There were no vitals taken for this visit. , BMI There is no height or weight on file to calculate BMI. Constitutional:  oriented to person, place, and time. No distress.  HENT:  Head: Grossly normal Eyes:  no discharge. No scleral icterus.  Neck: No JVD, no carotid bruits  Cardiovascular: Irregularly irregular no murmurs appreciated Pulmonary/Chest: Clear to auscultation bilaterally, no wheezes or rails Abdominal: Soft.  no distension.  no tenderness.  Musculoskeletal: Normal range of motion Neurological:  normal muscle tone.  Coordination normal. No atrophy Skin: Skin warm and dry Psychiatric: normal affect, pleasant  Recent Labs: 02/25/2020: TSH 1.449 05/01/2020: Hemoglobin 14.6; Platelets 219 07/10/2020: ALT 23 12/03/2020: BNP 275.7; BUN 22; Creatinine, Ser 1.25; Potassium 4.4; Sodium 140    Lipid Panel Lab Results  Component Value Date   CHOL 209 (H) 07/10/2020   HDL 57 07/10/2020   LDLCALC 133 (H) 07/10/2020   TRIG 107 07/10/2020      Wt Readings from Last 3 Encounters:  12/03/20 206 lb 4 oz (93.6 kg)  07/10/20 208 lb 4 oz (94.5 kg)  05/24/20 200 lb (90.7 kg)     ASSESSMENT AND PLAN:  Problem List Items Addressed This Visit       Cardiology Problems   NICM (nonischemic cardiomyopathy) ([place])   Persistent atrial fibrillation ([place]) - Primary   Other Visit Diagnoses     Essential hypertension       Chronic systolic heart failure (HCC)       History of pulmonary embolism          Atrial fibrillation, persistent Continue diltiazem 180 mg daily with metoprolol succinate 100 twice daily,  On Xarelto  Essential hypertension Blood pressure is well controlled on today's visit. No changes made  to the medications.  Pulmonary embolism On anticoagulation On Xarelto  Cardiomyopathy  avoid alcohol In atrial fibrillation, permanent likely  Muscle cramps: Avoid dehydration, hold torsemide on hot days in the sun   Total encounter time more than 25 minutes  Greater than 50% was spent in counseling and coordination of care with the patient    Signed, Esmond Plants, M.D., Ph.D. [place], [place]

## 2021-01-30 NOTE — Progress Notes (Signed)
No current cardiology Office Note  Date:  02/01/2021   ID:  Calvin Esparza, DOB [birth year], MRN [id number]  PCP:  [name], MD   Chief Complaint  Patient presents with   Other    1 month f/u. Meds reviewed verbally with pt. Patient c/o leg weakness.     HPI:  Calvin Esparza is a 60 year old gentleman with past medical history of Alcohol abuse Chronic pain, knee pain Recently seen in the emergency room August 28, 2018 for chest pain, dizzy, atrial fibrillation Heart rate from 110 up to 130 bpm CTA showing PE lower lobe branch on the right March 2021 Atrial fibrillation March 2021 COVID infection December 2020 Who presents for follow-up of his likely permanent atrial fibrillation  Last office visit April 2022  Last visit with one of our providers: diltiazem held presumably for low ejection fraction Metoprolol increased to 200 daily Reports rate has been running high  Complaining about pain in his legs, vein swelling, inside groins Tender right groin, hamstring Working as a Software engineer at [employer], 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  On paper prescribed 40 every other day, reports taking it daily sometimes twice a day  EKG personally reviewed by myself on todays visit Atrial fibrillation rate 109  Other past medical history reviewed Last seen in clinic September 2021 At that time was in atrial fibrillation In the hospital February 25, 2020 with atrial fibrillation with RVR rate 177 bpm Secondary to dysphagia has not been taking his anticoagulation or any of his pills Had EGD showing multiple gastric erosions, portal gastropathy Started on PPI twice daily  Seen by one of our providers March 01, 2020 Was on metoprolol succinate 100 twice daily and diltiazem 180 daily,  Xarelto   working in Architect, for cement all day, often in the hot basement, 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 fib with RVR Admission to the hospital Heatstroke, dehydration Was given IV fluids, heart rate improved, felt better, no significant medication changes were made  Seen in clinic in October 14, 2019, atrial fibrillation at that time  Seen in emergency room December 15, 2019 for shortness of breath  not taking his diltiazem for several days out of fear of interaction with colchicine blood pressure is elevated 160/113 pulse rate 119 was given a dose of IV Lasix and a dose of diltiazem, discharged home  Covid infection December 2020   PMH:   has a past medical history of Alcohol abuse, Chronic pain, COVID-19, HFrEF (heart failure with reduced ejection fraction) ([place]), History of medication noncompliance, Longstanding persistent atrial fibrillation ([place]), NICM (nonischemic cardiomyopathy) ([place]), and Pulmonary embolism ([place]).  PSH:    Past Surgical History:  Procedure Laterality Date   CARDIOVERSION N/A 07/29/2019   Procedure: CARDIOVERSION;  Surgeon: Minna Merritts, MD;  Location: [facility] ORS;  Service: Cardiovascular;  Laterality: N/A;   COLONOSCOPY WITH PROPOFOL N/A 05/24/2020   Procedure: COLONOSCOPY WITH PROPOFOL;  Surgeon: Lin Landsman, MD;  [facility] ENDOSCOPY;  Service: Gastroenterology;  Laterality: N/A;    ESOPHAGOGASTRODUODENOSCOPY (EGD) WITH PROPOFOL N/A 02/27/2020   Procedure: ESOPHAGOGASTRODUODENOSCOPY (EGD) WITH PROPOFOL;  Surgeon: Lin Landsman, MD;  Location: [facility] ENDOSCOPY;  Service: Gastroenterology;  Laterality: N/A;   ESOPHAGOGASTRODUODENOSCOPY (EGD) WITH PROPOFOL N/A 05/24/2020   Procedure: ESOPHAGOGASTRODUODENOSCOPY (EGD) WITH PROPOFOL;  Surgeon: Lin Landsman, MD;  Location: [facility] ENDOSCOPY;  Service: Gastroenterology;  Laterality: N/A;    Current Outpatient Medications  Medication Sig Dispense Refill   colchicine-probenecid 0.5-500 MG tablet Take 1 tablet by mouth 2 (two) times daily as needed (joint pain).     losartan (COZAAR) 25 MG tablet Take 1 tablet (25 mg total) by mouth daily. 90 tablet 1   meloxicam (MOBIC) 15 MG tablet Take 15 mg by mouth daily as needed.     metoprolol (TOPROL-XL) 200 MG 24 hr tablet Take 1 tablet (200 mg total) by mouth daily. 90 tablet 1   potassium chloride SA (KLOR-CON M20) 20 MEQ tablet Take 0.5 tablets (10 mEq total) by mouth daily. 45 tablet 0   rosuvastatin (CRESTOR) 5 MG tablet Take 1 tablet (5 mg total) by mouth daily. 90 tablet 3   torsemide (DEMADEX) 20 MG tablet Take 2 tablets (40 mg) by mouth once EVERY OTHER day 60 tablet 0   XARELTO 20 MG TABS tablet Take 1 tablet (20 mg total) by mouth daily with supper. 90 tablet 2   omeprazole (PRILOSEC) 20 MG capsule Take 1 capsule (20 mg total) by mouth 2 (two) times daily before a meal. (Patient not taking: Reported on 02/01/2021) 60 capsule 1   No current facility-administered medications for this visit.    Allergies:   Patient has no known allergies.   Social History:  The patient  reports that he has never smoked. His smokeless tobacco use includes chew. He reports current alcohol use of about 24.0 standard drinks per week. He reports that he does not currently use drugs.   Family History:   family history includes Bone cancer in his father; Diabetes in his sister; Stroke in his  father.    Review of Systems: Review of Systems  Constitutional: Negative.   HENT: Negative.    Eyes: Negative.   Respiratory: Negative.    Cardiovascular: Negative.   Gastrointestinal: Negative.   Musculoskeletal: Negative.   Neurological: Negative.   Psychiatric/Behavioral: Negative.    All other systems reviewed and are negative.  PHYSICAL EXAM: VS:  BP 120/88 (BP Location: Left Arm, Patient Position: Sitting, Cuff Size: Normal)   Pulse (!) 109   Ht 5\' 9"  (1.753 m)   Wt 211 lb 4 oz (95.8 kg)   SpO2 98%   BMI 31.20 kg/m  , BMI Body mass index is 31.2 kg/m. Constitutional:  oriented to person, place, and time. No distress.  HENT:  Head: Grossly normal Eyes:  no discharge. No scleral icterus.  Neck: No JVD, no carotid bruits  Cardiovascular: Regular rate and rhythm, no murmurs appreciated Pulmonary/Chest: Clear to auscultation bilaterally, no wheezes or rails Abdominal: Soft.  no distension.  no tenderness.  Musculoskeletal: Normal range of motion Neurological:  normal muscle tone. Coordination normal. No atrophy Skin: Skin warm and dry Psychiatric: normal affect, pleasant   Recent Labs: 02/25/2020: TSH 1.449 05/01/2020: Hemoglobin 14.6; Platelets 219 07/10/2020: ALT 23 12/03/2020: BNP 275.7; BUN 22;  Creatinine, Ser 1.25; Potassium 4.4; Sodium 140    Lipid Panel Lab Results  Component Value Date   CHOL 209 (H) 07/10/2020   HDL 57 07/10/2020   LDLCALC 133 (H) 07/10/2020   TRIG 107 07/10/2020      Wt Readings from Last 3 Encounters:  02/01/21 211 lb 4 oz (95.8 kg)  12/03/20 206 lb 4 oz (93.6 kg)  07/10/20 208 lb 4 oz (94.5 kg)     ASSESSMENT AND PLAN:  Problem List Items Addressed This Visit       Cardiology Problems   HTN (hypertension), benign   NICM (nonischemic cardiomyopathy) (HCC)   Persistent atrial fibrillation (HCC) - Primary   Other Visit Diagnoses     Essential hypertension       Chronic systolic heart failure (HCC)       HFrEF (heart  failure with reduced ejection fraction) (HCC)         Atrial fibrillation, persistent  metoprolol succinate 200 twice daily,  Rate continues to run high without the diltiazem Start digoxin for rate control, check a level in 2-3 weeks On Xarelto  Essential hypertension Blood pressure is well controlled on today's visit. No changes made to the medications.  Pulmonary embolism On anticoagulation On Xarelto  Cardiomyopathy avoid alcohol In atrial fibrillation, permanent likely     Total encounter time more than 25 minutes  Greater than 50% was spent in counseling and coordination of care with the patient    Signed, Esmond Plants, M.D., Ph.D. [place], [place]

## 2021-02-01 ENCOUNTER — Other Ambulatory Visit: Payer: Self-pay

## 2021-02-01 ENCOUNTER — Ambulatory Visit (INDEPENDENT_AMBULATORY_CARE_PROVIDER_SITE_OTHER): Payer: 59 | Admitting: Cardiovascular Disease

## 2021-02-01 ENCOUNTER — Encounter: Payer: Self-pay | Admitting: Cardiovascular Disease

## 2021-02-01 VITALS — BP 120/88 | HR 109 | Ht 69.0 in | Wt 211.2 lb

## 2021-02-01 DIAGNOSIS — I4819 Other persistent atrial fibrillation: Secondary | ICD-10-CM | POA: Diagnosis not present

## 2021-02-01 DIAGNOSIS — I5020 Unspecified systolic (congestive) heart failure: Secondary | ICD-10-CM

## 2021-02-01 DIAGNOSIS — I5022 Chronic systolic (congestive) heart failure: Secondary | ICD-10-CM | POA: Diagnosis not present

## 2021-02-01 DIAGNOSIS — I10 Essential (primary) hypertension: Secondary | ICD-10-CM | POA: Diagnosis not present

## 2021-02-01 DIAGNOSIS — I428 Other cardiomyopathies: Secondary | ICD-10-CM

## 2021-02-01 DIAGNOSIS — Z79899 Other long term (current) drug therapy: Secondary | ICD-10-CM

## 2021-02-01 DIAGNOSIS — Z5181 Encounter for therapeutic drug level monitoring: Secondary | ICD-10-CM

## 2021-02-01 MED ORDER — DIGOXIN 250 MCG PO TABS: 0.2500 mg | ORAL_TABLET | Freq: Every day | ORAL | 3 refills | Status: DC

## 2021-02-01 MED ORDER — TORSEMIDE 20 MG PO TABS: 20.0000 mg | ORAL_TABLET | Freq: Every day | ORAL | 3 refills | Status: DC

## 2021-02-01 NOTE — Patient Instructions (Addendum)
Medication Instructions:  Please START  digoxin 0.25 mg daily  Please REDUCE Torsemide down to 20 mg once a day  If you need a refill on your cardiac medications before your next appointment, please call your pharmacy.   Lab work: Digoxin levels (2-3 weeks)  Testing/Procedures: No new testing needed  Follow-Up: At [facility], you and your health needs are our priority.  As part of our continuing mission to provide you with exceptional heart care, we have created designated Provider Care Teams.  These Care Teams include your primary Cardiologist (physician) and Advanced Practice Providers (APPs -  Physician Assistants and Nurse Practitioners) who all work together to provide you with the care you need, when you need it.  You will need a follow up appointment in 6 months  Providers on your designated Care Team:   Murray Hodgkins, NP Christell Faith, PA-C Cadence Kathlen Mody, [place]  COVID-19 Vaccine Information can be found at: ShippingScam.co.uk For questions related to vaccine distribution or appointments, please email vaccine@[email or web address] .com or call [phone].

## 2021-02-04 ENCOUNTER — Other Ambulatory Visit: Payer: Self-pay | Admitting: Cardiovascular Disease

## 2021-02-04 NOTE — Telephone Encounter (Signed)
Refill Request.  

## 2021-02-04 NOTE — Telephone Encounter (Signed)
Xarelto 20 mg refill request received. Pt is 60 years old, weight- 95.8 kg, Crea- 1.25 on 12/03/20, last seen by Dr. Rockey Situ on 02/01/21, Diagnosis-afib, CrCl-87.28; Dose is appropriate based on dosing criteria. Will send in refill to requested pharmacy.

## 2021-02-15 ENCOUNTER — Other Ambulatory Visit: Payer: 59

## 2021-03-04 ENCOUNTER — Other Ambulatory Visit: Payer: Self-pay

## 2021-03-04 DIAGNOSIS — Z79899 Other long term (current) drug therapy: Secondary | ICD-10-CM

## 2021-03-06 ENCOUNTER — Other Ambulatory Visit: Payer: Self-pay

## 2021-03-06 ENCOUNTER — Other Ambulatory Visit (INDEPENDENT_AMBULATORY_CARE_PROVIDER_SITE_OTHER): Payer: 59

## 2021-03-06 DIAGNOSIS — Z79899 Other long term (current) drug therapy: Secondary | ICD-10-CM | POA: Diagnosis not present

## 2021-03-07 ENCOUNTER — Telehealth: Payer: Self-pay | Admitting: Cardiovascular Disease

## 2021-03-07 DIAGNOSIS — Z79899 Other long term (current) drug therapy: Secondary | ICD-10-CM

## 2021-03-07 DIAGNOSIS — I4819 Other persistent atrial fibrillation: Secondary | ICD-10-CM

## 2021-03-07 LAB — DIGOXIN LEVEL: Digoxin, Serum: 1.2 ng/mL — ABNORMAL HIGH (ref 0.5–0.9)

## 2021-03-07 MED ORDER — DIGOXIN 250 MCG PO TABS
ORAL_TABLET | ORAL | 3 refills | Status: DC
Start: 2021-03-07 — End: 2021-10-08

## 2021-03-07 NOTE — Telephone Encounter (Signed)
Secure chat message received from Dr. Rockey Situ- recommendations are to: 1) Decrease digoxin to 0.125 mg once daily 2) Repeat a digoxin level in 1-2 weeks   Attempted to contact the patient. No answer- I left a message to please call back.

## 2021-03-07 NOTE — Telephone Encounter (Signed)
Emily Filbert, RN  03/07/2021  9:58 AM EST     Digoxin level elevated at 1.2.   02/01/21- patient seen in clinic with Dr. Rockey Situ and started on Digoxin 0.25 mg once daily.    Secure chat sent to Dr. Rockey Situ to please review.

## 2021-03-07 NOTE — Telephone Encounter (Signed)
I spoke with the patient. I have advised him of his lab results and Dr. Donivan Scull recommendations to: 1) Decrease digoxin to 0.125 mg once daily 2) Repeat a digoxin level in 1-2 weeks  The patient voices understanding and is agreeable. He is fine with cutting the digoxin 0.25 mg tablet in 1/2. He will come on 03/19/21 at 8:00 am for his repeat lab work.

## 2021-03-08 ENCOUNTER — Other Ambulatory Visit: Payer: Self-pay | Admitting: Cardiovascular Disease

## 2021-03-19 ENCOUNTER — Other Ambulatory Visit: Payer: 59

## 2021-05-01 ENCOUNTER — Other Ambulatory Visit: Payer: Self-pay

## 2021-05-01 ENCOUNTER — Encounter: Payer: Self-pay | Admitting: Emergency Medicine

## 2021-05-01 ENCOUNTER — Emergency Department: Payer: 59

## 2021-05-01 ENCOUNTER — Emergency Department
Admission: EM | Admit: 2021-05-01 | Discharge: 2021-05-01 | Disposition: A | Discharge status: Home / Self Care | Payer: 59 | Attending: Student in an Organized Health Care Education/Training Program | Admitting: Student in an Organized Health Care Education/Training Program

## 2021-05-01 DIAGNOSIS — M7989 Other specified soft tissue disorders: Secondary | ICD-10-CM | POA: Diagnosis not present

## 2021-05-01 DIAGNOSIS — M25571 Pain in right ankle and joints of right foot: Secondary | ICD-10-CM | POA: Diagnosis not present

## 2021-05-01 DIAGNOSIS — M25572 Pain in left ankle and joints of left foot: Secondary | ICD-10-CM | POA: Insufficient documentation

## 2021-05-01 DIAGNOSIS — M79673 Pain in unspecified foot: Secondary | ICD-10-CM | POA: Diagnosis not present

## 2021-05-01 DIAGNOSIS — M79672 Pain in left foot: Secondary | ICD-10-CM | POA: Diagnosis not present

## 2021-05-01 DIAGNOSIS — R52 Pain, unspecified: Secondary | ICD-10-CM | POA: Diagnosis not present

## 2021-05-01 DIAGNOSIS — M79671 Pain in right foot: Secondary | ICD-10-CM | POA: Diagnosis not present

## 2021-05-01 DIAGNOSIS — R609 Edema, unspecified: Secondary | ICD-10-CM | POA: Diagnosis not present

## 2021-05-01 LAB — CBC WITH DIFFERENTIAL/PLATELET
Abs Immature Granulocytes: 0.02 10*3/uL (ref 0.00–0.07)
Basophils Absolute: 0 10*3/uL (ref 0.0–0.1)
Basophils Relative: 1 %
Eosinophils Absolute: 0 10*3/uL (ref 0.0–0.5)
Eosinophils Relative: 0 %
HCT: 43 % (ref 39.0–52.0)
Hemoglobin: 14 g/dL (ref 13.0–17.0)
Immature Granulocytes: 0 %
Lymphocytes Relative: 17 %
Lymphs Abs: 1.2 10*3/uL (ref 0.7–4.0)
MCH: 30.3 pg (ref 26.0–34.0)
MCHC: 32.6 g/dL (ref 30.0–36.0)
MCV: 93.1 fL (ref 80.0–100.0)
Monocytes Absolute: 0.8 10*3/uL (ref 0.1–1.0)
Monocytes Relative: 11 %
Neutro Abs: 5.3 10*3/uL (ref 1.7–7.7)
Neutrophils Relative %: 71 %
Platelets: 238 10*3/uL (ref 150–400)
RBC: 4.62 MIL/uL (ref 4.22–5.81)
RDW: 14.6 % (ref 11.5–15.5)
WBC: 7.4 10*3/uL (ref 4.0–10.5)
nRBC: 0 % (ref 0.0–0.2)

## 2021-05-01 LAB — BASIC METABOLIC PANEL
Anion gap: 9 (ref 5–15)
BUN: 15 mg/dL (ref 6–20)
CO2: 28 mmol/L (ref 22–32)
Calcium: 9.5 mg/dL (ref 8.9–10.3)
Chloride: 103 mmol/L (ref 98–111)
Creatinine, Ser: 1.02 mg/dL (ref 0.61–1.24)
GFR, Estimated: >60 mL/min (ref >60)
Glucose, Bld: 117 mg/dL — ABNORMAL HIGH (ref 70–99)
Potassium: 4.1 mmol/L (ref 3.5–5.1)
Sodium: 140 mmol/L (ref 135–145)

## 2021-05-01 LAB — URIC ACID: Uric Acid, Serum: 7.2 mg/dL (ref 3.7–8.6)

## 2021-05-01 MED ORDER — HYDROCODONE-ACETAMINOPHEN 5-325 MG PO TABS
1.0000 | ORAL_TABLET | Freq: Once | ORAL | Status: AC
  Administered 2021-05-01: 1 via ORAL
  Filled 2021-05-01: qty 1

## 2021-05-01 MED ORDER — COLCHICINE 0.6 MG PO TABS: 0.6000 mg | ORAL_TABLET | Freq: Two times a day (BID) | ORAL | 0 refills | Status: DC

## 2021-05-01 MED ORDER — HYDROCODONE-ACETAMINOPHEN 5-325 MG PO TABS: 1.0000 | ORAL_TABLET | Freq: Four times a day (QID) | ORAL | 0 refills | Status: DC | PRN

## 2021-05-01 MED ORDER — KETOROLAC TROMETHAMINE 30 MG/ML IJ SOLN
30.0000 mg | Freq: Once | INTRAMUSCULAR | Status: AC
  Administered 2021-05-01: 30 mg via INTRAVENOUS
  Filled 2021-05-01: qty 1

## 2021-05-01 NOTE — Discharge Instructions (Signed)
Call podiatry to see if you can be seen sooner.  Consider adding orthotic inserts into your shoes for added support and protection.  A prescription for hydrocodone was sent to the pharmacy as needed for pain.  Continue with your regular medications at home.

## 2021-05-01 NOTE — ED Triage Notes (Signed)
Pt comes into the ED via [facility] from home c/o left foot/ankle pain.  Pt just diagnosed with gout in the right foot as well and presents in a boot.  Pt states he is to the point where he is unable to ambulate because of the pain in the left ankle area.  Pt has even and unlabored respirations at this time.

## 2021-05-01 NOTE — ED Notes (Signed)
See triage note. Last gout flair up 10 years ago. States it feels similar.

## 2021-05-01 NOTE — ED Triage Notes (Signed)
Pt comes into the ED via EMS from home with c/o BL foot pain for the past week with a hx of gout   155/99 HR106

## 2021-05-01 NOTE — ED Provider Notes (Signed)
[facility] Provider Note    Event Date/Time   First MD Initiated Contact with Patient 05/01/21 1015     (approximate)   History   Foot Pain and Gout   HPI  Calvin Esparza is a 61 y.o. male   presents to the ED via EMS with complaint of bilateral foot and ankle pain.  Patient states that he believes he was diagnosed with gout in the past but has always had a normal uric acid level.  Patient has off-and-on been treated with colchicine which she does not have now.  He states that his right foot hurts worse than his left.  He ambulates with pain and states that he does have crutches at home.  Currently he is wearing a orthopedic boot on his right foot that is old and left over from a prior injury.  Patient routinely sees Dr. Cleda Mccreedy, who is in podiatry of her [facility] clinic but could not be seen by him today.      Physical Exam   Triage Vital Signs: ED Triage Vitals  Enc Vitals Group     BP 05/01/21 0954 137/83     Pulse Rate 05/01/21 0954 62     Resp 05/01/21 0954 19     Temp 05/01/21 0954 (!) 97.5 F (36.4 C)     Temp Source 05/01/21 0954 Oral     SpO2 05/01/21 0954 98 %     Weight 05/01/21 0955 211 lb 3.2 oz (95.8 kg)     Height 05/01/21 0955 5\' 9"  (1.753 m)     Head Circumference --      Peak Flow --      Pain Score 05/01/21 0955 10     Pain Loc --      Pain Edu? --      Excl. in [place]? --     Most recent vital signs: Vitals:   05/01/21 0954 05/01/21 1345  BP: 137/83 (!) 145/80  Pulse: 62 83  Resp: 19 18  Temp: (!) 97.5 F (36.4 C)   SpO2: 98% 100%     General: Awake, no distress.  CV:  Good peripheral perfusion.  Resp:  Normal effort.  Abd:  No distention.  Other:  Bilateral feet are edematous and moderately tender to light palpation both in the ankle area and metatarsal area bilaterally.  Pulses are intact.  Range of motion of the digits is normal in flexion extension.  Patient is able to bear weight and ambulate without any  assistance.   ED Results / Procedures / Treatments   Labs (all labs ordered are listed, but only abnormal results are displayed) Labs Reviewed  BASIC METABOLIC PANEL - Abnormal; Notable for the following components:      Result Value   Glucose, Bld 117 (*)    All other components within normal limits  CBC WITH DIFFERENTIAL/PLATELET  URIC ACID      RADIOLOGY Left ankle and foot x-ray were reviewed and no acute bony injury was noted.  Radiology report is negative for acute bony injury and shows mild spurring.  Right foot x-ray was reviewed and degenerative changes were noted.  Radiologist agrees with the moderate to severe degenerative changes noted in the right foot and ankle. Moderate first MTP joint osteoarthritis.   PROCEDURES:  Critical Care performed:   Procedures   MEDICATIONS ORDERED IN ED: Medications  ketorolac (TORADOL) 30 MG/ML injection 30 mg (30 mg Intravenous Given 05/01/21 1212)  HYDROcodone-acetaminophen (NORCO/VICODIN) 5-325 MG per  tablet 1 tablet (1 tablet Oral Given 05/01/21 1212)     IMPRESSION / MDM / ASSESSMENT AND PLAN / ED COURSE  I reviewed the triage vital signs and the nursing notes.   Differential diagnosis includes, but is not limited to, degenerative arthritis, acute gouty arthritis, chronic gout.   61 year old male presents to the ED with complaint of bilateral feet pain.  He mentioned to EMS that he had a history of gout and patient states he has had colchicine multiple times in the past.  No recent injury to either foot.  He states his right foot is actually worse than his left foot and currently wearing a orthopedic boot on the right foot.  We discussed the results of his x-rays with the right foot and ankle being more involved with degenerative changes than the left.  Also uric acid was 7.2 CBC and BMP were unremarkable.  Patient is to follow-up with his podiatrist as he has done multiple times previously.  He will continue with his regular  medication.  He states that while in the emergency department he got some relief with the Toradol 30 mg IV and hydrocodone.  Patient was amatory at the time of discharge without any assistance.     FINAL CLINICAL IMPRESSION(S) / ED DIAGNOSES   Final diagnoses:  Bilateral foot pain     Rx / DC Orders   ED Discharge Orders          Ordered    HYDROcodone-acetaminophen (NORCO/VICODIN) 5-325 MG tablet  Every 6 hours PRN        05/01/21 1337    colchicine 0.6 MG tablet  2 times daily        05/01/21 1345             Note:  This document was prepared using Dragon voice recognition software and may include unintentional dictation errors.   Johnn Hai, PA-C 05/01/21 1625    Merlyn Lot, MD 05/02/21 [phone]

## 2021-05-05 IMAGING — CR CHEST - 2 VIEW
1 series · 2 of 2 positions shown · non-contrast
Comparison: None.

CLINICAL DATA: Atrial fibrillation.

EXAM:
CHEST - 2 VIEW

[Series 1: dg chest 2 view · 0.14mm/px · 2 of 2 slices shown]
[im 1/2]
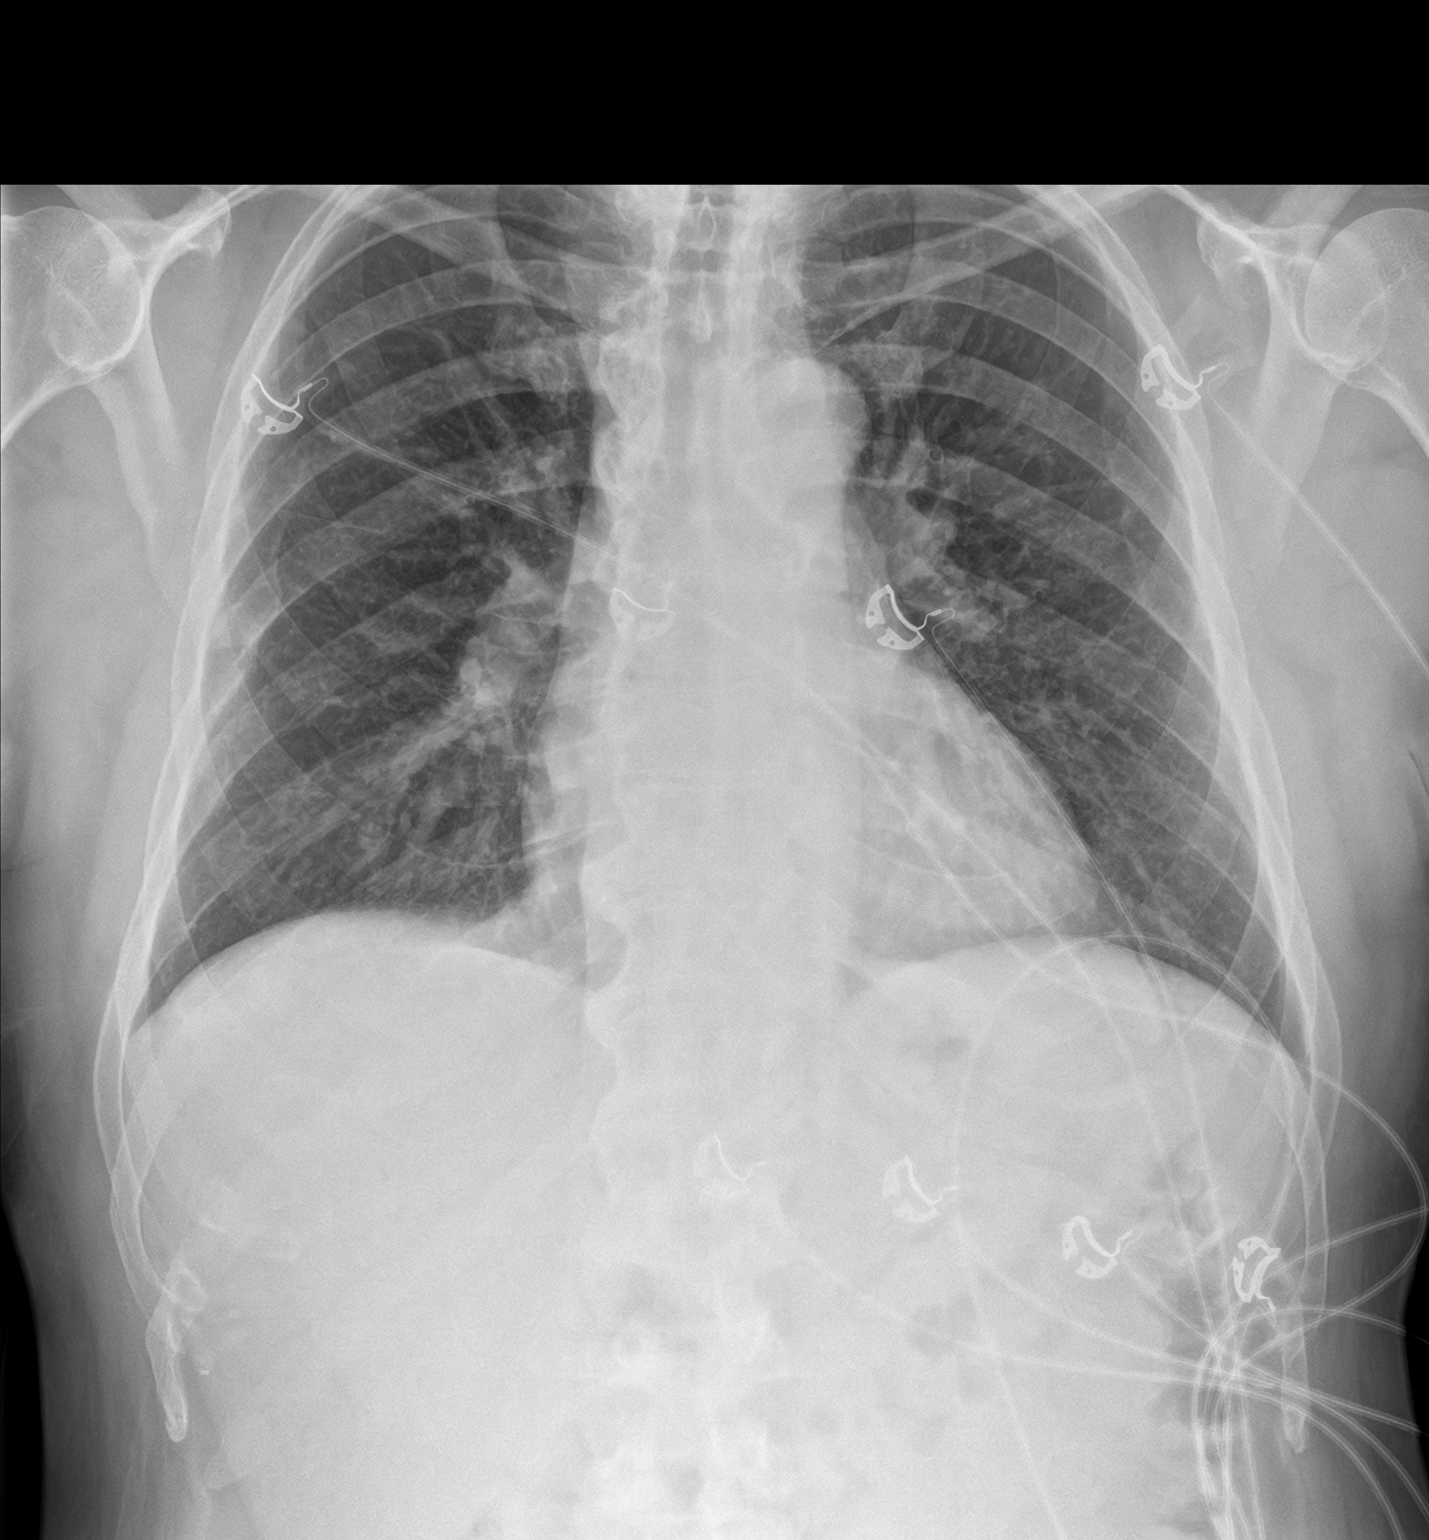
[im 2/2]
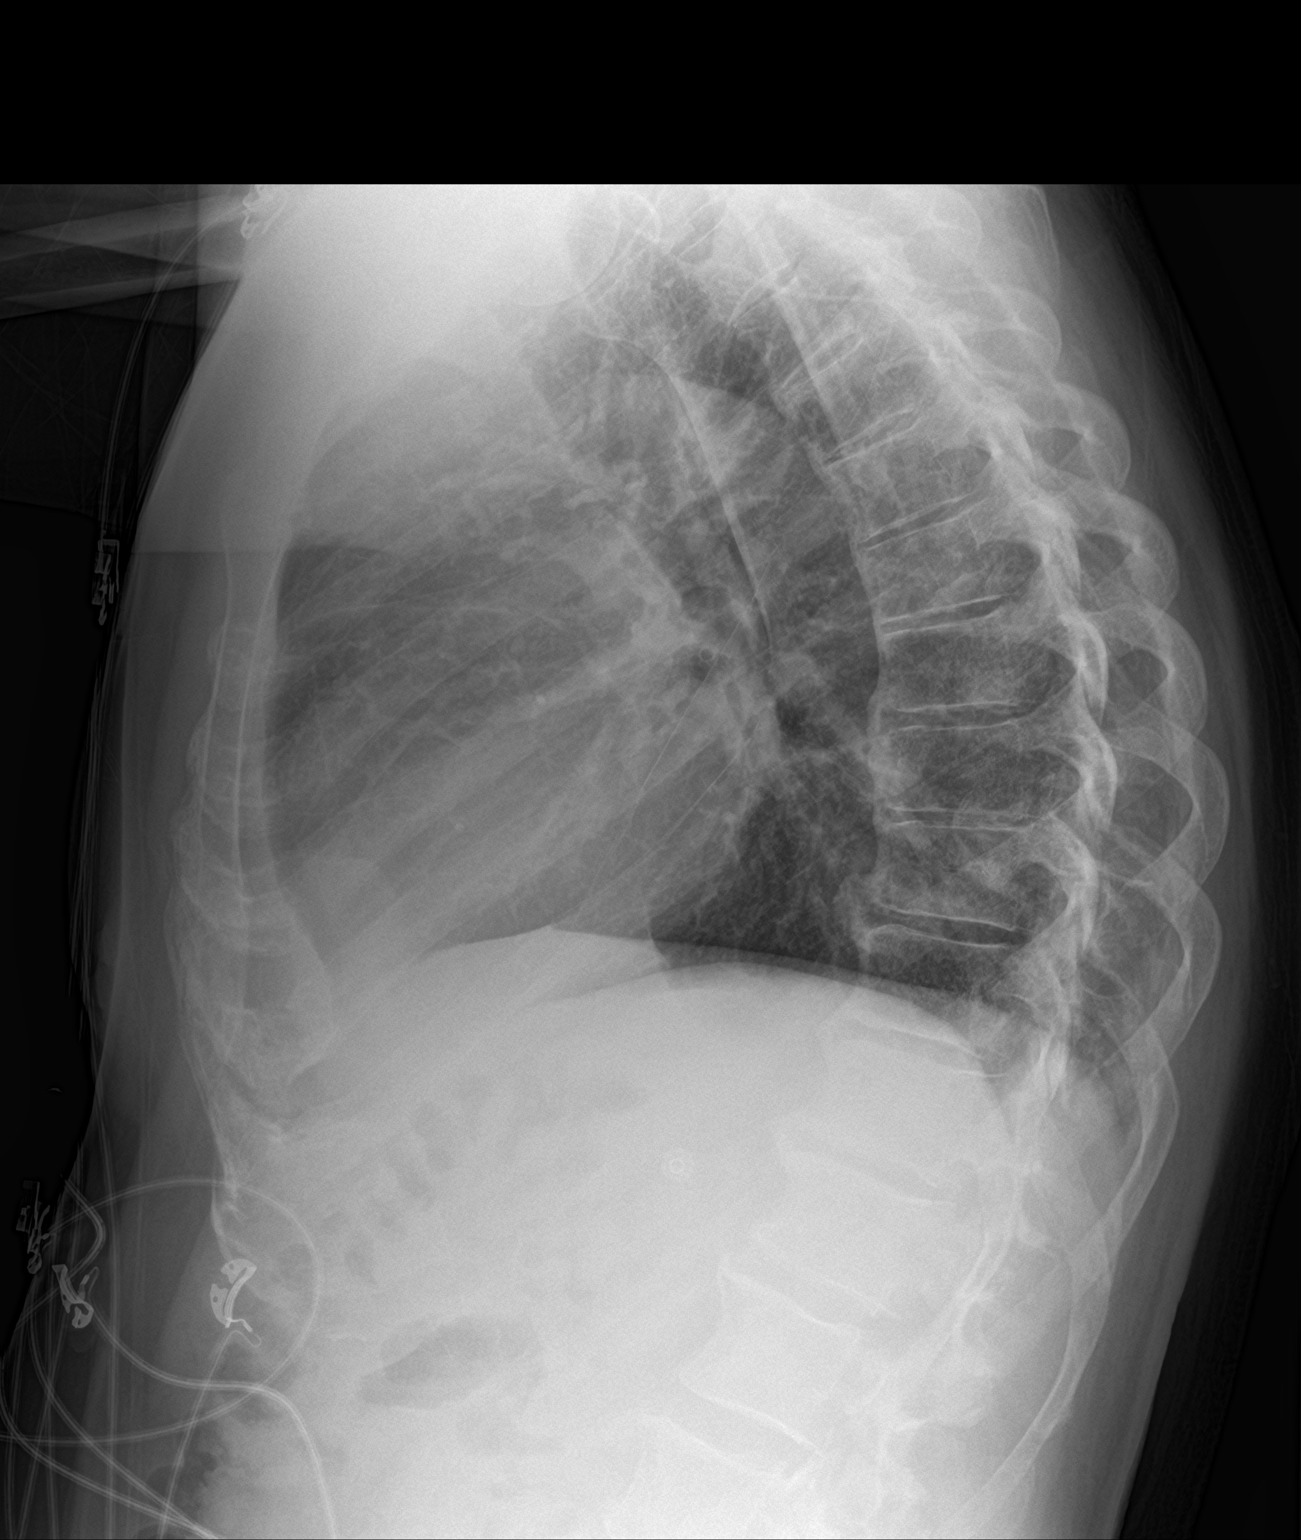

[2 of 2 positions shown; findings below may reference images not displayed]

FINDINGS: The heart, hila, mediastinum, lungs, and pleura are unremarkable.
IMPRESSION: No active cardiopulmonary disease.

## 2021-05-09 DIAGNOSIS — M10072 Idiopathic gout, left ankle and foot: Secondary | ICD-10-CM | POA: Diagnosis not present

## 2021-05-09 DIAGNOSIS — M205X1 Other deformities of toe(s) (acquired), right foot: Secondary | ICD-10-CM | POA: Diagnosis not present

## 2021-07-17 ENCOUNTER — Other Ambulatory Visit: Payer: Self-pay

## 2021-07-17 ENCOUNTER — Inpatient Hospital Stay
Admission: EM | Admit: 2021-07-17 | Discharge: 2021-07-21 | DRG: 291 | Disposition: A | Discharge status: Home / Self Care | Payer: 59 | Attending: Internal Medicine | Admitting: Internal Medicine

## 2021-07-17 ENCOUNTER — Emergency Department: Payer: 59

## 2021-07-17 ENCOUNTER — Encounter: Payer: Self-pay | Admitting: Emergency Medicine

## 2021-07-17 DIAGNOSIS — R079 Chest pain, unspecified: Secondary | ICD-10-CM | POA: Diagnosis not present

## 2021-07-17 DIAGNOSIS — Z823 Family history of stroke: Secondary | ICD-10-CM

## 2021-07-17 DIAGNOSIS — G8929 Other chronic pain: Secondary | ICD-10-CM | POA: Diagnosis present

## 2021-07-17 DIAGNOSIS — R778 Other specified abnormalities of plasma proteins: Secondary | ICD-10-CM | POA: Diagnosis present

## 2021-07-17 DIAGNOSIS — R69 Illness, unspecified: Secondary | ICD-10-CM | POA: Diagnosis not present

## 2021-07-17 DIAGNOSIS — I340 Nonrheumatic mitral (valve) insufficiency: Secondary | ICD-10-CM | POA: Diagnosis present

## 2021-07-17 DIAGNOSIS — Z79899 Other long term (current) drug therapy: Secondary | ICD-10-CM | POA: Diagnosis not present

## 2021-07-17 DIAGNOSIS — I428 Other cardiomyopathies: Secondary | ICD-10-CM | POA: Diagnosis present

## 2021-07-17 DIAGNOSIS — I10 Essential (primary) hypertension: Secondary | ICD-10-CM | POA: Diagnosis not present

## 2021-07-17 DIAGNOSIS — Z86711 Personal history of pulmonary embolism: Secondary | ICD-10-CM | POA: Diagnosis not present

## 2021-07-17 DIAGNOSIS — I5043 Acute on chronic combined systolic (congestive) and diastolic (congestive) heart failure: Secondary | ICD-10-CM | POA: Diagnosis present

## 2021-07-17 DIAGNOSIS — I5021 Acute systolic (congestive) heart failure: Secondary | ICD-10-CM | POA: Diagnosis not present

## 2021-07-17 DIAGNOSIS — F1410 Cocaine abuse, uncomplicated: Secondary | ICD-10-CM | POA: Diagnosis present

## 2021-07-17 DIAGNOSIS — J208 Acute bronchitis due to other specified organisms: Secondary | ICD-10-CM | POA: Diagnosis present

## 2021-07-17 DIAGNOSIS — K219 Gastro-esophageal reflux disease without esophagitis: Secondary | ICD-10-CM | POA: Diagnosis present

## 2021-07-17 DIAGNOSIS — I4811 Longstanding persistent atrial fibrillation: Secondary | ICD-10-CM | POA: Diagnosis present

## 2021-07-17 DIAGNOSIS — Z791 Long term (current) use of non-steroidal anti-inflammatories (NSAID): Secondary | ICD-10-CM | POA: Diagnosis not present

## 2021-07-17 DIAGNOSIS — I5042 Chronic combined systolic (congestive) and diastolic (congestive) heart failure: Secondary | ICD-10-CM | POA: Diagnosis not present

## 2021-07-17 DIAGNOSIS — R0902 Hypoxemia: Secondary | ICD-10-CM | POA: Diagnosis present

## 2021-07-17 DIAGNOSIS — F1010 Alcohol abuse, uncomplicated: Secondary | ICD-10-CM | POA: Diagnosis present

## 2021-07-17 DIAGNOSIS — F17220 Nicotine dependence, chewing tobacco, uncomplicated: Secondary | ICD-10-CM | POA: Diagnosis present

## 2021-07-17 DIAGNOSIS — I2699 Other pulmonary embolism without acute cor pulmonale: Secondary | ICD-10-CM | POA: Diagnosis present

## 2021-07-17 DIAGNOSIS — Z809 Family history of malignant neoplasm, unspecified: Secondary | ICD-10-CM | POA: Diagnosis not present

## 2021-07-17 DIAGNOSIS — E876 Hypokalemia: Secondary | ICD-10-CM | POA: Diagnosis not present

## 2021-07-17 DIAGNOSIS — J209 Acute bronchitis, unspecified: Secondary | ICD-10-CM | POA: Diagnosis present

## 2021-07-17 DIAGNOSIS — E785 Hyperlipidemia, unspecified: Secondary | ICD-10-CM | POA: Diagnosis not present

## 2021-07-17 DIAGNOSIS — I4891 Unspecified atrial fibrillation: Secondary | ICD-10-CM | POA: Diagnosis present

## 2021-07-17 DIAGNOSIS — Z833 Family history of diabetes mellitus: Secondary | ICD-10-CM | POA: Diagnosis not present

## 2021-07-17 DIAGNOSIS — R0602 Shortness of breath: Secondary | ICD-10-CM | POA: Diagnosis not present

## 2021-07-17 DIAGNOSIS — Z7901 Long term (current) use of anticoagulants: Secondary | ICD-10-CM | POA: Diagnosis not present

## 2021-07-17 DIAGNOSIS — U071 COVID-19: Secondary | ICD-10-CM | POA: Diagnosis not present

## 2021-07-17 DIAGNOSIS — I110 Hypertensive heart disease with heart failure: Secondary | ICD-10-CM | POA: Diagnosis present

## 2021-07-17 DIAGNOSIS — M109 Gout, unspecified: Secondary | ICD-10-CM | POA: Diagnosis present

## 2021-07-17 DIAGNOSIS — J40 Bronchitis, not specified as acute or chronic: Principal | ICD-10-CM

## 2021-07-17 DIAGNOSIS — R7989 Other specified abnormal findings of blood chemistry: Secondary | ICD-10-CM | POA: Diagnosis present

## 2021-07-17 LAB — CBC
HCT: 40.3 % (ref 39.0–52.0)
Hemoglobin: 12.6 g/dL — ABNORMAL LOW (ref 13.0–17.0)
MCH: 29.6 pg (ref 26.0–34.0)
MCHC: 31.3 g/dL (ref 30.0–36.0)
MCV: 94.8 fL (ref 80.0–100.0)
Platelets: 148 10*3/uL — ABNORMAL LOW (ref 150–400)
RBC: 4.25 MIL/uL (ref 4.22–5.81)
RDW: 15.7 % — ABNORMAL HIGH (ref 11.5–15.5)
WBC: 4.8 10*3/uL (ref 4.0–10.5)
nRBC: 0 % (ref 0.0–0.2)

## 2021-07-17 LAB — URINE DRUG SCREEN, QUALITATIVE (ARMC ONLY)
Amphetamines, Ur Screen: NOT DETECTED
Barbiturates, Ur Screen: NOT DETECTED
Benzodiazepine, Ur Scrn: NOT DETECTED
Cannabinoid 50 Ng, Ur ~~LOC~~: NOT DETECTED
Cocaine Metabolite,Ur ~~LOC~~: POSITIVE — AB
MDMA (Ecstasy)Ur Screen: NOT DETECTED
Methadone Scn, Ur: NOT DETECTED
Opiate, Ur Screen: NOT DETECTED
Phencyclidine (PCP) Ur S: NOT DETECTED
Tricyclic, Ur Screen: NOT DETECTED

## 2021-07-17 LAB — HIV ANTIBODY (ROUTINE TESTING W REFLEX): HIV Screen 4th Generation wRfx: NONREACTIVE

## 2021-07-17 LAB — BRAIN NATRIURETIC PEPTIDE: B Natriuretic Peptide: 545.6 pg/mL — ABNORMAL HIGH (ref 0.0–100.0)

## 2021-07-17 LAB — BASIC METABOLIC PANEL
Anion gap: 9 (ref 5–15)
BUN: 12 mg/dL (ref 6–20)
CO2: 25 mmol/L (ref 22–32)
Calcium: 9 mg/dL (ref 8.9–10.3)
Chloride: 108 mmol/L (ref 98–111)
Creatinine, Ser: 0.83 mg/dL (ref 0.61–1.24)
GFR, Estimated: >60 mL/min (ref >60)
Glucose, Bld: 118 mg/dL — ABNORMAL HIGH (ref 70–99)
Potassium: 3.4 mmol/L — ABNORMAL LOW (ref 3.5–5.1)
Sodium: 142 mmol/L (ref 135–145)

## 2021-07-17 LAB — RESP PANEL BY RT-PCR (FLU A&B, COVID) ARPGX2
Influenza A by PCR: NEGATIVE
Influenza B by PCR: NEGATIVE
SARS Coronavirus 2 by RT PCR: POSITIVE — AB

## 2021-07-17 LAB — PROTIME-INR
INR: 1.6 — ABNORMAL HIGH (ref 0.8–1.2)
Prothrombin Time: 18.9 seconds — ABNORMAL HIGH (ref 11.4–15.2)

## 2021-07-17 LAB — MAGNESIUM: Magnesium: 2.2 mg/dL (ref 1.7–2.4)

## 2021-07-17 LAB — TROPONIN I (HIGH SENSITIVITY)
Troponin I (High Sensitivity): 21 ng/L — ABNORMAL HIGH (ref <18)
Troponin I (High Sensitivity): 20 ng/L — ABNORMAL HIGH (ref <18)

## 2021-07-17 LAB — STREP PNEUMONIAE URINARY ANTIGEN: Strep Pneumo Urinary Antigen: NEGATIVE

## 2021-07-17 MED ORDER — THIAMINE HCL 100 MG/ML IJ SOLN
100.0000 mg | Freq: Every day | INTRAMUSCULAR | Status: DC
  Filled 2021-07-17: qty 2

## 2021-07-17 MED ORDER — THIAMINE HCL 100 MG PO TABS
100.0000 mg | ORAL_TABLET | Freq: Every day | ORAL | Status: DC
  Administered 2021-07-17 – 2021-07-21 (×5): 100 mg via ORAL
  Filled 2021-07-17 (×5): qty 1

## 2021-07-17 MED ORDER — ALBUTEROL SULFATE (2.5 MG/3ML) 0.083% IN NEBU: 2.5000 mg | INHALATION_SOLUTION | RESPIRATORY_TRACT | Status: DC | PRN

## 2021-07-17 MED ORDER — LOSARTAN POTASSIUM 25 MG PO TABS
25.0000 mg | ORAL_TABLET | Freq: Every day | ORAL | Status: DC
Start: 2021-07-17 — End: 2021-07-21
  Administered 2021-07-17 – 2021-07-21 (×5): 25 mg via ORAL
  Filled 2021-07-17 (×5): qty 1

## 2021-07-17 MED ORDER — SODIUM CHLORIDE 0.9 % IV SOLN
2.0000 g | INTRAVENOUS | Status: DC
  Administered 2021-07-17: 2 g via INTRAVENOUS
  Filled 2021-07-17: qty 20

## 2021-07-17 MED ORDER — ROSUVASTATIN CALCIUM 5 MG PO TABS: 5.0000 mg | ORAL_TABLET | Freq: Every day | ORAL | Status: DC

## 2021-07-17 MED ORDER — POTASSIUM CHLORIDE CRYS ER 20 MEQ PO TBCR
40.0000 meq | EXTENDED_RELEASE_TABLET | Freq: Once | ORAL | Status: AC
  Administered 2021-07-17: 40 meq via ORAL
  Filled 2021-07-17: qty 2

## 2021-07-17 MED ORDER — METOPROLOL SUCCINATE ER 100 MG PO TB24
100.0000 mg | ORAL_TABLET | Freq: Every day | ORAL | Status: DC
  Administered 2021-07-17 – 2021-07-19 (×3): 100 mg via ORAL
  Filled 2021-07-17: qty 2
  Filled 2021-07-17: qty 1
  Filled 2021-07-17: qty 2

## 2021-07-17 MED ORDER — LORAZEPAM 2 MG/ML IJ SOLN
0.0000 mg | Freq: Four times a day (QID) | INTRAMUSCULAR | Status: AC
  Administered 2021-07-17: 2 mg via INTRAVENOUS
  Administered 2021-07-17: 1 mg via INTRAVENOUS
  Filled 2021-07-17 (×2): qty 1

## 2021-07-17 MED ORDER — IPRATROPIUM-ALBUTEROL 0.5-2.5 (3) MG/3ML IN SOLN
3.0000 mL | Freq: Once | RESPIRATORY_TRACT | Status: AC
  Administered 2021-07-17: 3 mL via RESPIRATORY_TRACT
  Filled 2021-07-17: qty 3

## 2021-07-17 MED ORDER — LORAZEPAM 2 MG/ML IJ SOLN: 1.0000 mg | INTRAMUSCULAR | Status: AC | PRN

## 2021-07-17 MED ORDER — IPRATROPIUM-ALBUTEROL 0.5-2.5 (3) MG/3ML IN SOLN
3.0000 mL | RESPIRATORY_TRACT | Status: DC
  Administered 2021-07-17 – 2021-07-18 (×7): 3 mL via RESPIRATORY_TRACT
  Filled 2021-07-17 (×8): qty 3

## 2021-07-17 MED ORDER — ALUM & MAG HYDROXIDE-SIMETH 200-200-20 MG/5ML PO SUSP
30.0000 mL | Freq: Four times a day (QID) | ORAL | Status: DC | PRN
Start: 2021-07-17 — End: 2021-07-21

## 2021-07-17 MED ORDER — LORAZEPAM 2 MG/ML IJ SOLN
0.0000 mg | Freq: Two times a day (BID) | INTRAMUSCULAR | Status: AC
  Administered 2021-07-20: 2 mg via INTRAVENOUS
  Filled 2021-07-17: qty 1

## 2021-07-17 MED ORDER — PANTOPRAZOLE SODIUM 40 MG PO TBEC
40.0000 mg | DELAYED_RELEASE_TABLET | Freq: Every day | ORAL | Status: DC
  Administered 2021-07-17 – 2021-07-21 (×5): 40 mg via ORAL
  Filled 2021-07-17 (×5): qty 1

## 2021-07-17 MED ORDER — FOLIC ACID 1 MG PO TABS
1.0000 mg | ORAL_TABLET | Freq: Every day | ORAL | Status: DC
  Administered 2021-07-17 – 2021-07-21 (×5): 1 mg via ORAL
  Filled 2021-07-17 (×5): qty 1

## 2021-07-17 MED ORDER — RIVAROXABAN 20 MG PO TABS
20.0000 mg | ORAL_TABLET | Freq: Every day | ORAL | Status: DC
  Administered 2021-07-17 – 2021-07-20 (×4): 20 mg via ORAL
  Filled 2021-07-17 (×4): qty 1

## 2021-07-17 MED ORDER — PREDNISONE 20 MG PO TABS
60.0000 mg | ORAL_TABLET | Freq: Once | ORAL | Status: AC
Start: 2021-07-17 — End: 2021-07-17
  Administered 2021-07-17: 60 mg via ORAL
  Filled 2021-07-17: qty 3

## 2021-07-17 MED ORDER — HYDRALAZINE HCL 20 MG/ML IJ SOLN: 5.0000 mg | INTRAMUSCULAR | Status: DC | PRN

## 2021-07-17 MED ORDER — ACETAMINOPHEN 325 MG PO TABS
650.0000 mg | ORAL_TABLET | Freq: Four times a day (QID) | ORAL | Status: DC | PRN
Start: 2021-07-17 — End: 2021-07-21
  Administered 2021-07-19: 650 mg via ORAL
  Filled 2021-07-17 (×2): qty 2

## 2021-07-17 MED ORDER — SODIUM CHLORIDE 0.9 % IV SOLN
500.0000 mg | INTRAVENOUS | Status: DC
  Administered 2021-07-17: 500 mg via INTRAVENOUS
  Filled 2021-07-17 (×2): qty 5

## 2021-07-17 MED ORDER — DM-GUAIFENESIN ER 30-600 MG PO TB12
1.0000 | ORAL_TABLET | Freq: Two times a day (BID) | ORAL | Status: DC | PRN
Start: 2021-07-17 — End: 2021-07-21
  Administered 2021-07-19 – 2021-07-20 (×4): 1 via ORAL
  Filled 2021-07-17 (×4): qty 1

## 2021-07-17 MED ORDER — DIGOXIN 125 MCG PO TABS
0.1250 mg | ORAL_TABLET | Freq: Every day | ORAL | Status: DC
  Administered 2021-07-18 – 2021-07-21 (×4): 0.125 mg via ORAL
  Filled 2021-07-17 (×4): qty 1

## 2021-07-17 MED ORDER — FUROSEMIDE 10 MG/ML IJ SOLN
40.0000 mg | Freq: Two times a day (BID) | INTRAMUSCULAR | Status: DC
Start: 2021-07-17 — End: 2021-07-19
  Administered 2021-07-17 – 2021-07-18 (×4): 40 mg via INTRAVENOUS
  Filled 2021-07-17 (×4): qty 4

## 2021-07-17 MED ORDER — ADULT MULTIVITAMIN W/MINERALS CH
1.0000 | ORAL_TABLET | Freq: Every day | ORAL | Status: DC
  Administered 2021-07-17 – 2021-07-21 (×5): 1 via ORAL
  Filled 2021-07-17 (×5): qty 1

## 2021-07-17 MED ORDER — LORAZEPAM 1 MG PO TABS
1.0000 mg | ORAL_TABLET | ORAL | Status: AC | PRN
  Administered 2021-07-18: 1 mg via ORAL
  Filled 2021-07-17: qty 1

## 2021-07-17 MED ORDER — ONDANSETRON HCL 4 MG/2ML IJ SOLN
4.0000 mg | Freq: Three times a day (TID) | INTRAMUSCULAR | Status: DC | PRN
Start: 2021-07-17 — End: 2021-07-21
  Administered 2021-07-17 – 2021-07-20 (×2): 4 mg via INTRAVENOUS
  Filled 2021-07-17 (×2): qty 2

## 2021-07-17 MED ORDER — COLCHICINE 0.6 MG PO TABS
0.6000 mg | ORAL_TABLET | Freq: Two times a day (BID) | ORAL | Status: DC | PRN
  Administered 2021-07-19: 0.6 mg via ORAL
  Filled 2021-07-17 (×2): qty 1

## 2021-07-17 MED ORDER — AZITHROMYCIN 250 MG PO TABS
250.0000 mg | ORAL_TABLET | Freq: Every day | ORAL | Status: DC
Start: 2021-07-18 — End: 2021-07-20
  Administered 2021-07-18 – 2021-07-20 (×3): 250 mg via ORAL
  Filled 2021-07-17 (×3): qty 1

## 2021-07-17 MED ORDER — PREDNISONE 20 MG PO TABS: 50.0000 mg | ORAL_TABLET | Freq: Every day | ORAL | Status: DC

## 2021-07-17 NOTE — Assessment & Plan Note (Signed)
Continue Xarelto 

## 2021-07-17 NOTE — ED Triage Notes (Signed)
Pt to ED from home c/o SOB that started last week.  SOB worse with exertion, even walking to mailbox.  States chest tightness.  Hx of afib, takes Nutritional therapist.  Denies n/v/d, denies fevers, has had a productive yellow cough.  Pt A&Ox4, speaking in full complete and coherent sentences, in NAD at this time. ?

## 2021-07-17 NOTE — ED Notes (Signed)
Meds given  pt eating dinner meal  pt alert  pt on 4 liters oxygen  ?

## 2021-07-17 NOTE — Assessment & Plan Note (Signed)
Potassium 3.4.  Magnesium 2.2 ?-Repleted potassium ?

## 2021-07-17 NOTE — ED Notes (Signed)
Pt placed on 2L BNC for comfort. Oxygen sats between 88%-91% on RA. ?

## 2021-07-17 NOTE — Assessment & Plan Note (Signed)
Patient has possible acute bronchitis.  Per ED physician, patient initially had wheezing on auscultation which has resolved after treated with bronchodilators and give 1 dose of 60 mg of prednisone in ED.  Chest x-ray negative for pneumonia.  Patient was started on IV Rocephin and azithromycin in ED ?-Switch IV Rocephin and azithromycin to oral azithromycin ?-Sputum culture ?-Bronchodilators ?-Check urine antigen for Legionella and strep ? ?

## 2021-07-17 NOTE — ED Notes (Signed)
Pt ambulated in the hallway with this tech. Pt's O2 sats dropped to 86% after walking about 10 ft. Pt sats quickly returned to 92/93% upon arriving back to the room. Pt stated been having difficulty breathing while walking since Wednesday.  ?

## 2021-07-17 NOTE — Assessment & Plan Note (Addendum)
-   Continue home colchicine 

## 2021-07-17 NOTE — ED Notes (Signed)
Meds given   dinner meal given to pt ?

## 2021-07-17 NOTE — Assessment & Plan Note (Signed)
Troponin level 21 --> 21.  No chest pain.  Likely due to demand ischemia ?-Continue Crestor ?-Patient is on Xarelto, will not start aspirin ?

## 2021-07-17 NOTE — H&P (Signed)
?History and Physical  ? ? ?Calvin Esparza UVO:[id number] DOB: 11-02-1960 DOA: 07/17/2021 ? ?Referring MD/NP/PA:  ? ?PCP: [name], MD  ? ?Patient coming from:  The patient is coming from home.  At baseline, pt is independent for most of ADL.       ? ?Chief Complaint: SOB ? ?HPI: Calvin Esparza is a 61 y.o. male with medical history significant of CHF with EF 35-40%, hypertension, hyperlipidemia, GERD, gout, PE and atrial fibrillation on Xarelto, alcohol abuse, who presents with shortness of breath ? ?Patient states that he has shortness of breath for more than 1 week, which has been progressively worsening, particularly on exertion.  Patient has cough with yellow-colored sputum production initially, currently with clear mucus production.  No fever or chills.  He has chest tightness, denies active chest pain.  No nausea, vomiting, diarrhea or abdominal pain.  No symptoms of UTI.  Last dose of Xarelto was last night. ? ?Per EDP, pt had wheezing on auscultation initially. After treated with bronchodilators and Prednisone, his wheezing has resolved when I saw pt in ED. I did not appreciate wheezing on auscultation. ? ?Data Reviewed and ED Course: pt was found to have WBC 4.8, INR 1.6, troponin level 21, 30, BNP 545.6, pending COVID PCR, potassium 3.4, GFR > 60, magnesium 2.2, temperature normal, blood pressure 169/121, heart rate 107, RR 18, oxygen sat 91-99% on room air.  Chest x-ray negative.  Patient is admitted to telemetry bed as inpatient ? ? ?EKG: I have personally reviewed.  Atrial fibrillation, QTc 418, nonspecific T wave change ? ? ?Review of Systems:  ? ?General: no fevers, chills, no body weight gain, has fatigue ?HEENT: no blurry vision, hearing changes or sore throat ?Respiratory: has dyspnea, coughing ?CV: no chest pain, no palpitations ?GI: no nausea, vomiting, abdominal pain, diarrhea, constipation ?GU: no dysuria, burning on urination, increased urinary frequency, hematuria  ?Ext:  has  leg edema ?Neuro: no unilateral weakness, numbness, or tingling, no vision change or hearing loss ?Skin: no rash, no skin tear. ?MSK: No muscle spasm, no deformity, no limitation of range of movement in spin ?Heme: No easy bruising.  ?Travel history: No recent long distant travel. ? ? ?Allergy: No Known Allergies ? ?Past Medical History:  ?Diagnosis Date  ? Alcohol abuse   ? Chronic pain   ? COVID-19   ? a. 02/2019  ? HFrEF (heart failure with reduced ejection fraction) ([place])   ? a. 05/2019 Echo: EF 35-40%.  ? History of medication noncompliance   ? Longstanding persistent atrial fibrillation ([place])   ? a. Dx 03/2018; b. 05/2019 recurrent Afib in setting of PE. CHA2DS2VASc = 1-->Eliquis later changed to xarelto; b. 07/2019 s/p DCCV (150J); d. 08/2019 admit w/ AF RVR in settting of noncompliance - longstanding persistent afib since on bb/ccb rx.  ? NICM (nonischemic cardiomyopathy) ([place])   ? a. 05/2019 Echo: EF 35-40%, gr1 DD. Mod enlarged RV. Mildly dil LA. Mild to mod dil RA. Triv AI; b. 05/2019 MV: EF 32%, no ischemia.  ? Pulmonary embolism ([place])   ? a. 05/2019 CTA Chest: mild amt of PE w/in a lower lobe branch of RPA; b. 08/2019 CTA Chest: Small PE in RML and RLL PA.  ? ? ?Past Surgical History:  ?Procedure Laterality Date  ? CARDIOVERSION N/A 07/29/2019  ? Procedure: CARDIOVERSION;  Surgeon: Minna Merritts, MD;  Location: [facility] ORS;  Service: Cardiovascular;  Laterality: N/A;  ? COLONOSCOPY WITH PROPOFOL N/A 05/24/2020  ? Procedure:  COLONOSCOPY WITH PROPOFOL;  Surgeon: Lin Landsman, MD;  Location[facility] ENDOSCOPY;  Service: Gastroenterology;  Laterality: N/A;  ? ESOPHAGOGASTRODUODENOSCOPY (EGD) WITH PROPOFOL N/A 02/27/2020  ? Procedure: ESOPHAGOGASTRODUODENOSCOPY (EGD) WITH PROPOFOL;  Surgeon: Lin Landsman, MD;  Location: [facility] ENDOSCOPY;  Service: Gastroenterology;  Laterality: N/A;  ? ESOPHAGOGASTRODUODENOSCOPY (EGD) WITH PROPOFOL N/A 05/24/2020  ? Procedure: ESOPHAGOGASTRODUODENOSCOPY (EGD) WITH PROPOFOL;   Surgeon: Lin Landsman, MD;  Location: [facility] ENDOSCOPY;  Service: Gastroenterology;  Laterality: N/A;  ? ? ?Social History:  reports that he has never smoked. His smokeless tobacco use includes chew. He reports current alcohol use of about 24.0 standard drinks per week. He reports that he does not currently use drugs. ? ?Family History:  ?Family History  ?Problem Relation Age of Onset  ? Bone cancer Father   ? Stroke Father   ? Diabetes Sister   ?  ? ?Prior to Admission medications   ?Medication Sig Start Date End Date Taking? Authorizing Provider  ?colchicine 0.6 MG tablet Take 1 tablet (0.6 mg total) by mouth 2 (two) times daily. 05/01/21 05/01/22  Johnn Hai, PA-C  ?colchicine-probenecid 0.5-500 MG tablet Take 1 tablet by mouth 2 (two) times daily as needed (joint pain). 09/26/19   [provider]  ?digoxin (LANOXIN) 0.25 MG tablet Take 0.5 tablet (0.125 mg) by mouth once daily 03/07/21   Minna Merritts, MD  ?HYDROcodone-acetaminophen (NORCO/VICODIN) 5-325 MG tablet Take 1 tablet by mouth every 6 (six) hours as needed for moderate pain. 05/01/21 05/01/22  Johnn Hai, PA-C  ?losartan (COZAAR) 25 MG tablet TAKE 1 TABLET (25 MG TOTAL) BY MOUTH DAILY. 03/12/21   Minna Merritts, MD  ?meloxicam (MOBIC) 15 MG tablet Take 15 mg by mouth daily as needed. 11/09/20   [provider]  ?metoprolol (TOPROL-XL) 200 MG 24 hr tablet Take 1 tablet (200 mg total) by mouth daily. 12/03/20   Furth, Cadence H, PA-C  ?omeprazole (PRILOSEC) 20 MG capsule Take 1 capsule (20 mg total) by mouth 2 (two) times daily before a meal. ?Patient not taking: Reported on 02/01/2021 02/27/20 02/26/21  Wouk, Ailene Rud, MD  ?potassium chloride SA (KLOR-CON M20) 20 MEQ tablet Take 0.5 tablets (10 mEq total) by mouth daily. 12/10/20   Minna Merritts, MD  ?rosuvastatin (CRESTOR) 5 MG tablet Take 1 tablet (5 mg total) by mouth daily. 07/17/20 02/01/21  Minna Merritts, MD  ?torsemide (DEMADEX) 20 MG tablet Take 1  tablet (20 mg total) by mouth daily. 02/01/21   Minna Merritts, MD  ?XARELTO 20 MG TABS tablet TAKE 1 TABLET (20 MG TOTAL) BY MOUTH DAILY WITH SUPPER. 02/04/21   Rockey Situ, Kathlene November, MD  ? ? ?Physical Exam: ?Vitals:  ? 07/17/21 1158 07/17/21 1200 07/17/21 1324 07/17/21 1600  ?BP:  (!) 150/115 (!) 157/112 136/84  ?Pulse: (!) 106 (!) 110 86 (!) 127  ?Resp:  '20 18 20  '$ ?Temp:      ?TempSrc:      ?SpO2:  95% 96% 95%  ?Weight:      ?Height:      ? ?General: Not in acute distress ?HEENT: ?      Eyes: PERRL, EOMI, no scleral icterus. ?      ENT: No discharge from the ears and nose, no pharynx injection, no tonsillar enlargement.  ?      Neck: no JVD, no bruit, no mass felt. ?Heme: No neck lymph node enlargement. ?Cardiac: S1/S2, RRR, No murmurs, No gallops or rubs. ?Respiratory: has  fine crackles bilaterally, worse on the right side ?GI: Soft, nondistended, nontender, no rebound pain, no organomegaly, BS present. ?GU: No hematuria ?Ext: 1+ pitting leg edema bilaterally. 1+DP/PT pulse bilaterally. ?Musculoskeletal: No joint deformities, No joint redness or warmth, no limitation of ROM in spin. ?Skin: No rashes.  ?Neuro: Alert, oriented X3, cranial nerves II-XII grossly intact, moves all extremities normally.  ?Psych: Patient is not psychotic, no suicidal or hemocidal ideation. ? ?Labs on Admission: I have personally reviewed following labs and imaging studies ? ?CBC: ?Recent Labs  ?Lab 07/17/21 ?3335  ?WBC 4.8  ?HGB 12.6*  ?HCT 40.3  ?MCV 94.8  ?PLT 148*  ? ?Basic Metabolic Panel: ?Recent Labs  ?Lab 07/17/21 ?4562  ?NA 142  ?K 3.4*  ?CL 108  ?CO2 25  ?GLUCOSE 118*  ?BUN 12  ?CREATININE 0.83  ?CALCIUM 9.0  ?MG 2.2  ? ?GFR: ?Estimated Creatinine Clearance: 111 mL/min (by C-G formula based on SCr of 0.83 mg/dL). ?Liver Function Tests: ?No results for input(s): AST, ALT, ALKPHOS, BILITOT, PROT, ALBUMIN in the last 168 hours. ?No results for input(s): LIPASE, AMYLASE in the last 168 hours. ?No results for input(s): AMMONIA in  the last 168 hours. ?Coagulation Profile: ?Recent Labs  ?Lab 07/17/21 ?5638  ?INR 1.6*  ? ?Cardiac Enzymes: ?No results for input(s): CKTOTAL, CKMB, CKMBINDEX, TROPONINI in the last 168 hours. ?BNP (last 3

## 2021-07-17 NOTE — Assessment & Plan Note (Signed)
-   Did counseling about importance of quitting alcohol abuse. -CIWA protocol 

## 2021-07-17 NOTE — ED Provider Notes (Signed)
? ?[facility] ?Provider Note ? ? ? Event Date/Time  ? First MD Initiated Contact with Patient 07/17/21 0730   ?  (approximate) ? ? ?History  ? ?Shortness of Breath ? ? ?HPI ? ?Calvin Esparza is a 61 y.o. male with A-fib, prior history of PE, on Xarelto who comes in with concerns for cough and shortness of breath.  Patient reports not feeling well since Thursday of last week.  He reports having a lot of mucus productive coughing and feeling more short of breath.  He reports some chest tightness associated with it as well.  Patient denies a history of smoking or COPD or asthma.  He denies any known COVID exposures.  Patient reports prior noncompliance with his Xarelto but is adamant that he has been compliant over the past month.  Patient denies any swelling of one leg or calf tenderness.  Denies any abdominal pain. ? ? ? ?Physical Exam  ? ?Triage Vital Signs: ?ED Triage Vitals  ?Enc Vitals Group  ?   BP 07/17/21 0345 (!) 169/121  ?   Pulse Rate 07/17/21 0345 (!) 105  ?   Resp 07/17/21 0345 18  ?   Temp 07/17/21 0345 98.4 ?F (36.9 ?C)  ?   Temp Source 07/17/21 0345 Oral  ?   SpO2 07/17/21 0345 91 %  ?   Weight 07/17/21 0344 210 lb (95.3 kg)  ?   Height 07/17/21 0344 '5\' 10"'$  (1.778 m)  ?   Head Circumference --   ?   Peak Flow --   ?   Pain Score 07/17/21 0344 6  ?   Pain Loc --   ?   Pain Edu? --   ?   Excl. in [place]? --   ? ? ?Most recent vital signs: ?Vitals:  ? 07/17/21 0345 07/17/21 0616  ?BP: (!) 169/121 (!) 163/118  ?Pulse: (!) 105 (!) 107  ?Resp: 18 18  ?Temp: 98.4 ?F (36.9 ?C)   ?SpO2: 91% 99%  ? ? ? ?General: Awake, no distress.  ?CV:  Good peripheral perfusion.  Irregular, tachycardic ?Resp:  Normal effort.  Diffuse wheezing noted bilaterally. ?Abd:  No distention.  Soft and nontender ?Other:  No leg swelling.  No calf tenderness ? ? ?ED Results / Procedures / Treatments  ? ?Labs ?(all labs ordered are listed, but only abnormal results are displayed) ?Labs Reviewed  ?BASIC METABOLIC  PANEL - Abnormal; Notable for the following components:  ?    Result Value  ? Potassium 3.4 (*)   ? Glucose, Bld 118 (*)   ? All other components within normal limits  ?CBC - Abnormal; Notable for the following components:  ? Hemoglobin 12.6 (*)   ? RDW 15.7 (*)   ? Platelets 148 (*)   ? All other components within normal limits  ?PROTIME-INR - Abnormal; Notable for the following components:  ? Prothrombin Time 18.9 (*)   ? INR 1.6 (*)   ? All other components within normal limits  ?BRAIN NATRIURETIC PEPTIDE - Abnormal; Notable for the following components:  ? B Natriuretic Peptide 545.6 (*)   ? All other components within normal limits  ?TROPONIN I (HIGH SENSITIVITY) - Abnormal; Notable for the following components:  ? Troponin I (High Sensitivity) 21 (*)   ? All other components within normal limits  ?TROPONIN I (HIGH SENSITIVITY) - Abnormal; Notable for the following components:  ? Troponin I (High Sensitivity) 20 (*)   ? All other components within normal  limits  ? ? ? ?EKG ? ?My interpretation of EKG: ? ?Atrial fibrillation rate of 104 without any ST elevation, T wave versions in V4 through V6 ? ?RADIOLOGY ?I have reviewed the xray personally without evidence of pneumonia ? ? ?PROCEDURES: ? ?Critical Care performed: No ? ?Procedures ? ? ?MEDICATIONS ORDERED IN ED: ?Medications  ?ipratropium-albuterol (DUONEB) 0.5-2.5 (3) MG/3ML nebulizer solution 3 mL (has no administration in time range)  ?predniSONE (DELTASONE) tablet 60 mg (has no administration in time range)  ? ? ? ?IMPRESSION / MDM / ASSESSMENT AND PLAN / ED COURSE  ?I reviewed the triage vital signs and the nursing notes. ? ?Patient comes in slightly tachycardic and hypertensive with concerns for productive cough and shortness of breath.  On examination patient is wheezing but denies any smoking history.  I suspect patient has bronchitis.  We will test for COVID and flu although he is out of the window for treatment.  Chest x-ray without any evidence of  pneumonia.  Initial cardiac markers slightly elevated at 21 but downtrending to 20.  BNP slightly low potassium otherwise reassuring.  Hemoglobin slightly low but again overall reassuring.  BNP is downtrending from a year ago patient does not look fluid overloaded on exam.  I considered the possibility of PE but patient reports been compliant with his blood thinner over the past month so seems less likely given the cough and wheezing.  I suspect more likely viral versus bacterial bronchitis.  We will give a DuoNeb, steroids and do ambulatory trial given patient satting 92% on room air in the room ? ?Patient given 3 DuoNebs and steroids and with ambulation trial he desats down to 86% and at rest he satting 91 to 92%.  Given the concern for hypoxia discussed with patient admission.  We discussed CT imaging but patient has been compliant with his Xarelto so PE seems less likely.  Given the cough, congestion and wheezing I suspect this is more likely a viral versus bacterial bronchitis.  I have started him on antibiotics for possible pneumonia.  Patient at time of disposition does not meet sepsis criteria so blood cultures and lactate were not ordered ? ?I reviewed an office visit from 05/09/2021 where patient was seen for gout.  ? ? ? ? ?FINAL CLINICAL IMPRESSION(S) / ED DIAGNOSES  ? ?Final diagnoses:  ?Bronchitis  ?SOB (shortness of breath)  ?Hypoxia  ? ? ? ?Rx / DC Orders  ? ?ED Discharge Orders   ? ? None  ? ?  ? ? ? ?Note:  This document was prepared using Dragon voice recognition software and may include unintentional dictation errors. ?  ?Vanessa Bokchito, MD ?07/17/21 [phone] ? ?

## 2021-07-17 NOTE — ED Notes (Signed)
Blood cultures collected at 1035. ?

## 2021-07-17 NOTE — Assessment & Plan Note (Addendum)
Heart rate 100-130 ?-Continue home metoprolol and digoxin ?-Xarelto ?

## 2021-07-17 NOTE — Assessment & Plan Note (Signed)
2D echo on 06/08/2019 showed EF of 35- 40 with grade 1 diastolic dysfunction.  Patient has worsening shortness of breath, 1+ leg edema, elevated BNP 545, clinically consistent with CHF exacerbation. ? ? ?-Will admit to tele bed unit as inpatient ?-Lasix 40 mg bid by IV ?-2d echo ?-Daily weights ?-strict I/O's ?-Low salt diet ?-Fluid restriction ?-Obtain REDs Vest reading ? ? ?

## 2021-07-17 NOTE — Assessment & Plan Note (Signed)
-   Crestor 

## 2021-07-17 NOTE — Progress Notes (Signed)
HR in 120's-130's. MD made aware. MD to order PO metoprolol.  ?

## 2021-07-17 NOTE — Assessment & Plan Note (Signed)
-   IV hydralazine as needed -Continue Cozaar 

## 2021-07-18 ENCOUNTER — Inpatient Hospital Stay (HOSPITAL_COMMUNITY)
Admit: 2021-07-18 | Discharge: 2021-07-18 | Disposition: A | Discharge status: Home / Self Care | Payer: 59 | Attending: Internal Medicine | Admitting: Internal Medicine

## 2021-07-18 DIAGNOSIS — U071 COVID-19: Secondary | ICD-10-CM

## 2021-07-18 DIAGNOSIS — I5043 Acute on chronic combined systolic (congestive) and diastolic (congestive) heart failure: Secondary | ICD-10-CM

## 2021-07-18 DIAGNOSIS — I5021 Acute systolic (congestive) heart failure: Secondary | ICD-10-CM | POA: Diagnosis not present

## 2021-07-18 LAB — ECHOCARDIOGRAM COMPLETE
AR max vel: 3.95 cm2
AV Area VTI: 3.61 cm2
AV Area mean vel: 3.86 cm2
AV Mean grad: 2 mmHg
AV Peak grad: 2.9 mmHg
Ao pk vel: 0.85 m/s
Area-P 1/2: 3.63 cm2
Height: 70 in
MV VTI: 2.72 cm2
P 1/2 time: 407 msec
S' Lateral: 3.45 cm
Weight: 3360 oz

## 2021-07-18 LAB — CBC
HCT: 39.2 % (ref 39.0–52.0)
Hemoglobin: 12.5 g/dL — ABNORMAL LOW (ref 13.0–17.0)
MCH: 30 pg (ref 26.0–34.0)
MCHC: 31.9 g/dL (ref 30.0–36.0)
MCV: 94.2 fL (ref 80.0–100.0)
Platelets: 150 10*3/uL (ref 150–400)
RBC: 4.16 MIL/uL — ABNORMAL LOW (ref 4.22–5.81)
RDW: 15.1 % (ref 11.5–15.5)
WBC: 5 10*3/uL (ref 4.0–10.5)
nRBC: 0 % (ref 0.0–0.2)

## 2021-07-18 LAB — BASIC METABOLIC PANEL
Anion gap: 6 (ref 5–15)
BUN: 11 mg/dL (ref 6–20)
CO2: 32 mmol/L (ref 22–32)
Calcium: 8.3 mg/dL — ABNORMAL LOW (ref 8.9–10.3)
Chloride: 104 mmol/L (ref 98–111)
Creatinine, Ser: 0.89 mg/dL (ref 0.61–1.24)
GFR, Estimated: >60 mL/min (ref >60)
Glucose, Bld: 116 mg/dL — ABNORMAL HIGH (ref 70–99)
Potassium: 3.3 mmol/L — ABNORMAL LOW (ref 3.5–5.1)
Sodium: 142 mmol/L (ref 135–145)

## 2021-07-18 LAB — LEGIONELLA PNEUMOPHILA SEROGP 1 UR AG: L. pneumophila Serogp 1 Ur Ag: NEGATIVE

## 2021-07-18 MED ORDER — IPRATROPIUM-ALBUTEROL 20-100 MCG/ACT IN AERS
1.0000 | INHALATION_SPRAY | Freq: Four times a day (QID) | RESPIRATORY_TRACT | Status: DC
  Administered 2021-07-18 – 2021-07-21 (×10): 1 via RESPIRATORY_TRACT
  Filled 2021-07-18: qty 4

## 2021-07-18 MED ORDER — ALBUTEROL SULFATE HFA 108 (90 BASE) MCG/ACT IN AERS
1.0000 | INHALATION_SPRAY | RESPIRATORY_TRACT | Status: DC | PRN
  Filled 2021-07-18: qty 6.7

## 2021-07-18 MED ORDER — POTASSIUM CHLORIDE 20 MEQ PO PACK
40.0000 meq | PACK | ORAL | Status: AC
  Administered 2021-07-18 (×2): 40 meq via ORAL
  Filled 2021-07-18: qty 2

## 2021-07-18 MED ORDER — METHOCARBAMOL 500 MG PO TABS
500.0000 mg | ORAL_TABLET | Freq: Once | ORAL | Status: AC
  Administered 2021-07-18: 500 mg via ORAL
  Filled 2021-07-18: qty 1

## 2021-07-18 MED ORDER — PERFLUTREN LIPID MICROSPHERE
1.0000 mL | INTRAVENOUS | Status: AC | PRN
  Administered 2021-07-18: 3 mL via INTRAVENOUS
  Filled 2021-07-18: qty 10

## 2021-07-18 NOTE — Progress Notes (Signed)
*  PRELIMINARY RESULTS* ?Echocardiogram ?2D Echocardiogram has been performed. ? ?Calvin Esparza ?07/18/2021, 2:10 PM ?

## 2021-07-18 NOTE — Progress Notes (Signed)
?  Progress Note ? ? ?Patient: Calvin Esparza AXK:[id number] DOB: Apr 28, 1960 DOA: 07/17/2021     1 ?DOS: the patient was seen and examined on 07/18/2021 ?  ?Brief hospital course: ?Calvin Esparza is a 61 y.o. male with medical history significant of CHF with EF 35-40%, hypertension, hyperlipidemia, GERD, gout, PE and atrial fibrillation on Xarelto, alcohol abuse, who presents with shortness of breath. ? ?Assessment and Plan: ?Acute on chronic combined systolic and diastolic congestive heart failure ?Elevated troponin secondary to CHF. ?Patient baseline ejection fraction was 35 to 40% with grade 1 diastolic dysfunction. ?Patient has volume overload at this time, he is started on IV Lasix 40 mg twice a day. ?We will continue for now. ? ?COVID viral infection. ?Acute bronchitis ?Patient chest x-ray did not show any pneumonia. ?Patient does not have hypoxemia, discussed with the nurse, patient was put on oxygen for comfort. ?Patient with symptoms started last Wednesday, antiviral agent will not be effective. ? ?Alcohol abuse. ?Cocaine abuse. ?Hypokalemia. ?Patient drug screen positive for cocaine.  He admits using it last week. ?Currently patient does not have any alcohol withdrawal.  We will continue supplement potassium.  Monitor level. ? ?Chronic atrial fibrillation with RVR. ?History of PE ?Patient heart rate is better controlled today, continue anticoagulation. ? ? ? ? ?  ? ?Subjective:  ?Patient feels better today, still has some short of breath with exertion.  Minimal cough. ?No diarrhea. ? ?Physical Exam: ?Vitals:  ? 07/18/21 0700 07/18/21 1000 07/18/21 1223 07/18/21 1227  ?BP: (!) 162/99 125/90 131/87 131/87  ?Pulse: 93 (!) 101 89 89  ?Resp: 18 19 (!) 21   ?Temp:      ?TempSrc:      ?SpO2: 95% 94% 97%   ?Weight:      ?Height:      ? ?General exam: Appears calm and comfortable  ?Respiratory system: Clear to auscultation. Respiratory effort normal. ?Cardiovascular system: S1 & S2 heard, RRR. No JVD, murmurs, rubs,  gallops or clicks. No pedal edema. ?Gastrointestinal system: Abdomen is nondistended, soft and nontender. No organomegaly or masses felt. Normal bowel sounds heard. ?Central nervous system: Alert and oriented. No focal neurological deficits. ?Extremities: Symmetric 5 x 5 power. ?Skin: No rashes, lesions or ulcers ?Psychiatry: Judgement and insight appear normal. Mood & affect appropriate.  ? ?Data Reviewed: ? ?Reviewed chest x-ray results, lab results.  Also reviewed her prior echocardiogram. ? ?Family Communication:  ? ?Disposition: ?Status is: Inpatient ?Remains inpatient appropriate because: Severity of disease, IV treatment. ? Planned Discharge Destination: Home ? ? ? ?Time spent: 35 minutes ? ?Author: ?Sharen Hones, MD ?07/18/2021 1:28 PM ? ?For on call review [email or web address].  ?

## 2021-07-19 DIAGNOSIS — U071 COVID-19: Secondary | ICD-10-CM

## 2021-07-19 LAB — BASIC METABOLIC PANEL
Anion gap: 12 (ref 5–15)
BUN: 13 mg/dL (ref 6–20)
CO2: 30 mmol/L (ref 22–32)
Calcium: 9.1 mg/dL (ref 8.9–10.3)
Chloride: 95 mmol/L — ABNORMAL LOW (ref 98–111)
Creatinine, Ser: 0.95 mg/dL (ref 0.61–1.24)
GFR, Estimated: >60 mL/min (ref >60)
Glucose, Bld: 115 mg/dL — ABNORMAL HIGH (ref 70–99)
Potassium: 3.4 mmol/L — ABNORMAL LOW (ref 3.5–5.1)
Sodium: 137 mmol/L (ref 135–145)

## 2021-07-19 LAB — MAGNESIUM: Magnesium: 2 mg/dL (ref 1.7–2.4)

## 2021-07-19 MED ORDER — ROSUVASTATIN CALCIUM 5 MG PO TABS
5.0000 mg | ORAL_TABLET | Freq: Every day | ORAL | Status: DC
Start: 2021-07-19 — End: 2021-07-21
  Administered 2021-07-19 – 2021-07-21 (×3): 5 mg via ORAL
  Filled 2021-07-19 (×3): qty 1

## 2021-07-19 MED ORDER — POTASSIUM CHLORIDE 20 MEQ PO PACK
40.0000 meq | PACK | Freq: Two times a day (BID) | ORAL | Status: AC
Start: 2021-07-19 — End: 2021-07-19
  Administered 2021-07-19 (×2): 40 meq via ORAL
  Filled 2021-07-19 (×2): qty 2

## 2021-07-19 MED ORDER — OXYCODONE-ACETAMINOPHEN 5-325 MG PO TABS
1.0000 | ORAL_TABLET | Freq: Four times a day (QID) | ORAL | Status: DC | PRN
Start: 2021-07-19 — End: 2021-07-21
  Administered 2021-07-19 – 2021-07-20 (×6): 1 via ORAL
  Filled 2021-07-19 (×6): qty 1

## 2021-07-19 MED ORDER — METOPROLOL SUCCINATE ER 100 MG PO TB24
100.0000 mg | ORAL_TABLET | Freq: Two times a day (BID) | ORAL | Status: DC
  Administered 2021-07-19 – 2021-07-21 (×4): 100 mg via ORAL
  Filled 2021-07-19 (×4): qty 1

## 2021-07-19 MED ORDER — TORSEMIDE 20 MG PO TABS
20.0000 mg | ORAL_TABLET | Freq: Two times a day (BID) | ORAL | Status: DC
  Administered 2021-07-19: 20 mg via ORAL
  Filled 2021-07-19: qty 1

## 2021-07-19 MED ORDER — COLCHICINE 0.6 MG PO TABS
0.6000 mg | ORAL_TABLET | Freq: Two times a day (BID) | ORAL | Status: DC
  Administered 2021-07-19 – 2021-07-21 (×4): 0.6 mg via ORAL
  Filled 2021-07-19 (×4): qty 1

## 2021-07-19 MED ORDER — DILTIAZEM HCL 30 MG PO TABS
60.0000 mg | ORAL_TABLET | Freq: Three times a day (TID) | ORAL | Status: DC
  Administered 2021-07-19: 60 mg via ORAL
  Filled 2021-07-19 (×2): qty 2

## 2021-07-19 MED ORDER — TORSEMIDE 20 MG PO TABS
40.0000 mg | ORAL_TABLET | Freq: Two times a day (BID) | ORAL | Status: DC
Start: 2021-07-19 — End: 2021-07-21
  Administered 2021-07-19 – 2021-07-21 (×4): 40 mg via ORAL
  Filled 2021-07-19 (×4): qty 2

## 2021-07-19 NOTE — Consult Note (Signed)
? ?  Heart Failure Nurse Navigator Note ? ?HFrEF 40 to 45%.  Mild LVH.  Left ventricular diastolic parameters are indeterminate.  Left atrial size is moderately dilated.  Mild mitral regurgitation. ? ?He presented from home with complaints of shortness of breath, productive cough, wheezes and increasing lower extremity edema. ? ?Comorbidities: ? ?Atrial fibrillation on NOAC ?Hypertension ?Hyperlipidemia ?GERD ?Gout ?Substance abuse ? ?Medications: ? ?Digoxin 0.125 mg daily ?Cardizem 60 mg every 8 hours ?Folic acid 1 mg ?Losartan 25 mg daily ?Metoprolol succinate 100 mg daily ?Potassium chloride 40 mEq ?Xarelto 20 mg daily with supper ?Thiamine 100 mg daily ?Torsemide 20 mg 2 times a day ? ?Labs: ? ?BNP 545, sodium 137, potassium 3.4, chloride 95, CO2 30, BUN 13, creatinine 0.95, GFR greater than 60 ?Weight is 95.3 kg ?Blood pressure 117/86 ? ? ?Initial meeting with patient, he was lying quietly in bed in no acute distress. ? 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  He states that he could drink up to 10-16.9 ounce bottles of water daily along with 6-8 beers in the evening.  Went over fluid restriction and explained to him how much over his limit that he is drinking.  Also discussed his alcohol intake is in excess and is a toxin to his heart.  He voices understanding. ? ?He states that his diet mostly can consist of convenience foods.  Discussed how they are higher in sodium content. ? ?He states that he is gone 2 weeks without his torsemide. ? ?Discussed getting back in the outpatient heart failure clinic with [facility], as she could help with keeping his medications in order and also he could be reporting changes in his symptoms such as weight gain, lower extremity edema increasing shortness of breath etc. ? ?Appointment is May 12 at 9 AM. He has a 15%no show --6 out of 45  appointments. ? ?He had no further questions. ? ?Pricilla Riffle RN CHFN ?

## 2021-07-19 NOTE — TOC Initial Note (Signed)
Transition of Care (TOC) - Initial/Assessment Note  ? ? ?Patient Details  ?Name: Calvin Esparza ?MRN: [id number] ?Date of Birth: 1960-10-06 ? ?Transition of Care (TOC) CM/SW Contact:    ?Laurena Slimmer, RN ?Phone Number: ?07/19/2021, 1:56 PM ? ?Clinical Narrative:                 ?Provided substance abuse resources. ? ?  ?  ? ? ?Patient Goals and CMS Choice ?  ?  ?  ? ?Expected Discharge Plan and Services ?  ?  ?  ?  ?  ?                ?  ?  ?  ?  ?  ?  ?  ?  ?  ?  ? ?Prior Living Arrangements/Services ?  ?  ?  ?       ?  ?  ?  ?  ? ?Activities of Daily Living ?Home Assistive Devices/Equipment: Gilford Rile (specify type) ?ADL Screening (condition at time of admission) ?Patient's cognitive ability adequate to safely complete daily activities?: Yes ?Is the patient deaf or have difficulty hearing?: No ?Does the patient have difficulty seeing, even when wearing glasses/contacts?: No ?Does the patient have difficulty concentrating, remembering, or making decisions?: No ?Patient able to express need for assistance with ADLs?: Yes ?Does the patient have difficulty dressing or bathing?: No ?Independently performs ADLs?: Yes (appropriate for developmental age) ?Does the patient have difficulty walking or climbing stairs?: No ?Weakness of Legs: None ?Weakness of Arms/Hands: None ? ?Permission Sought/Granted ?  ?  ?   ?   ?   ?   ? ?Emotional Assessment ?  ?  ?  ?  ?  ?  ? ?Admission diagnosis:  Acute bronchitis [J20.9] ?SOB (shortness of breath) [R06.02] ?Bronchitis [J40] ?Hypoxia [R09.02] ?Patient Active Problem List  ? Diagnosis Date Noted  ? COVID-19 virus infection 07/18/2021  ? Acute bronchitis 07/17/2021  ? Elevated troponin 07/17/2021  ? Hypokalemia 07/17/2021  ? HLD (hyperlipidemia) 07/17/2021  ? Lesion of esophagus   ? Screening for colon cancer   ? Rapid atrial fibrillation ([place]) 02/25/2020  ? Acute on chronic combined systolic (congestive) and diastolic (congestive) heart failure ([place]) 02/25/2020  ? Pre-syncope  10/10/2019  ? Acute on chronic HFrEF (heart failure with reduced ejection fraction) ([place])   ? Persistent atrial fibrillation ([place])   ? NICM (nonischemic cardiomyopathy) ([place]) 06/09/2019  ? Acute on chronic combined systolic and diastolic CHF (congestive heart failure) ([place]) 06/08/2019  ? Pulmonary embolism ([place]) 06/07/2019  ? Atrial fibrillation with RVR ([place]) 10/01/2018  ? Alcohol abuse 10/01/2018  ? Gout 10/09/2013  ? HTN (hypertension), benign 10/09/2013  ? OA (osteoarthritis) 10/09/2013  ? ?PCP:  [name], MD ?Pharmacy:   ?[id number]?Phone: [phone]: [phone]? ? ? ? ?Social Determinants of Health (SDOH) Interventions ?  ? ?Readmission Risk Interventions ?   ? View : No data to display.  ?  ?  ?  ? ? ? ?

## 2021-07-19 NOTE — Progress Notes (Signed)
?  Progress Note ? ? ?Patient: DELVONTE BERENSON BMW:[id number] DOB: 02-Sep-1960 DOA: 07/17/2021     2 ?DOS: the patient was seen and examined on 07/19/2021 ?  ?Brief hospital course: ? ?AHMEER TUMAN is a 61 y.o. male with medical history significant of CHF with EF 35-40%, hypertension, hyperlipidemia, GERD, gout, PE and atrial fibrillation on Xarelto, alcohol abuse, who presents with shortness of breath. ?Assessment and Plan: ? ?Acute on chronic combined systolic and diastolic congestive heart failure ?Elevated troponin secondary to CHF. ?Patient baseline ejection fraction was 35 to 40% with grade 1 diastolic dysfunction. ?Volume status is better today, will transition to oral torsemide with increased dose of 40 mg twice a day. ? ?COVID viral infection. ?Acute bronchitis ?Continue symptomatic treatment.  Patient still has no hypoxemia. ?  ?Alcohol abuse. ?Cocaine abuse. ?Hypokalemia. ?Continue replete potassium.  No evidence of alcohol withdrawal. ?  ?Chronic atrial fibrillation with RVR. ?History of PE ?Heart rate still running fast, blood pressure stable.  Added diltiazem this morning, will increase metoprolol to 100 twice a day.  No need for additional diltiazem afterwards. ?  ? ?  ? ?Subjective:  ?Patient still complaining of pain all over, does not have any short of breath anymore. ? ?Physical Exam: ?Vitals:  ? 07/18/21 2309 07/19/21 0334 07/19/21 0822 07/19/21 1204  ?BP: (!) 149/103 (!) 146/90 132/86 117/86  ?Pulse: (!) 108 (!) 108 92 (!) 103  ?Resp: '17 16  18  '$ ?Temp: 98 ?F (36.7 ?C) 97.9 ?F (36.6 ?C) 98.5 ?F (36.9 ?C) 98.3 ?F (36.8 ?C)  ?TempSrc:    Oral  ?SpO2: 92% 91% 91% 91%  ?Weight:      ?Height:      ? ?General exam: Appears calm and comfortable  ?Respiratory system: Clear to auscultation. Respiratory effort normal. ?Cardiovascular system: irregular and tachycardic. No JVD, murmurs, rubs, gallops or clicks. No pedal edema. ?Gastrointestinal system: Abdomen is nondistended, soft and nontender. No  organomegaly or masses felt. Normal bowel sounds heard. ?Central nervous system: Alert and oriented. No focal neurological deficits. ?Extremities: Symmetric 5 x 5 power. ?Skin: No rashes, lesions or ulcers ?Psychiatry: Judgement and insight appear normal. Mood & affect appropriate.  ? ?Data Reviewed: ? ?Lab results reviewed ? ?Family Communication:  ? ?Disposition: ?Status is: Inpatient ?Remains inpatient appropriate because: Severity of disease ? Planned Discharge Destination: Home ? ? ? ?Time spent: 28 minutes ? ?Author: ?Sharen Hones, MD ?07/19/2021 12:58 PM ? ?For on call review [email or web address]  ?

## 2021-07-20 LAB — BASIC METABOLIC PANEL
Anion gap: 9 (ref 5–15)
BUN: 17 mg/dL (ref 6–20)
CO2: 30 mmol/L (ref 22–32)
Calcium: 9.1 mg/dL (ref 8.9–10.3)
Chloride: 95 mmol/L — ABNORMAL LOW (ref 98–111)
Creatinine, Ser: 1.13 mg/dL (ref 0.61–1.24)
GFR, Estimated: >60 mL/min (ref >60)
Glucose, Bld: 112 mg/dL — ABNORMAL HIGH (ref 70–99)
Potassium: 3.9 mmol/L (ref 3.5–5.1)
Sodium: 134 mmol/L — ABNORMAL LOW (ref 135–145)

## 2021-07-20 LAB — MAGNESIUM: Magnesium: 2.1 mg/dL (ref 1.7–2.4)

## 2021-07-20 MED ORDER — PREDNISONE 20 MG PO TABS
40.0000 mg | ORAL_TABLET | Freq: Every day | ORAL | Status: DC
Start: 2021-07-20 — End: 2021-07-21
  Administered 2021-07-20 – 2021-07-21 (×2): 40 mg via ORAL
  Filled 2021-07-20 (×2): qty 2

## 2021-07-20 MED ORDER — PREDNISONE 20 MG PO TABS: 40.0000 mg | ORAL_TABLET | Freq: Every day | ORAL | Status: DC

## 2021-07-20 NOTE — Progress Notes (Addendum)
Made Dr. Roosevelt Locks aware new order for prednisone was ordered to start tomorrow. Per md okay to change order for prednisone to start today. ?

## 2021-07-20 NOTE — Progress Notes (Signed)
?  Progress Note ? ? ?Patient: Calvin Esparza:[id number] DOB: 1960-10-17 DOA: 07/17/2021     3 ?DOS: the patient was seen and examined on 07/20/2021 ?  ?Brief hospital course: ?Calvin Esparza is a 61 y.o. male with medical history significant of CHF with EF 35-40%, hypertension, hyperlipidemia, GERD, gout, PE and atrial fibrillation on Xarelto, alcohol abuse, who presents with shortness of breath. ?Patient was tested positive for COVID, she also developed acute on chronic systolic congestive heart failure was started on IV Lasix. ?Repeat echocardiogram showed ejection fraction 40 to 45%. ? ?Assessment and Plan: ?Acute on chronic combined systolic and diastolic congestive heart failure ?Elevated troponin secondary to CHF. ?Patient baseline ejection fraction was 35 to 40% with grade 1 diastolic dysfunction. ?Repeated echocardiogram showed 40 to 45% ejection fraction. ?Continue increased dose of torsemide 40 mg twice a day. ? ?COVID viral infection. ?Acute bronchitis ?Patient was placed on oxygen last night, but that he does not have hypoxemia.  Continue bronchodilator. ?  ?Alcohol abuse. ?Cocaine abuse. ?Hypokalemia. ?Continue as needed Ativan, no alcohol withdrawal at this time. ?  ?Chronic atrial fibrillation with RVR. ?History of PE ?Heart rate is better after increase metoprolol to 100 mg twice a day.  Continue anticoagulation. ? ?Right wrist gouty arthritis. ?Patient had recurrent gouty arthritis, he has a swollen right wrist.  We will start prednisone taper for now ? ? ?  ? ?Subjective:  ?Patient was placed on oxygen last night, he states that this is because he was so hot in the room as a condition that was malfunctioning.  He has a cough, nonproductive.  He is complaining of right risk swelling and pain ? ?Physical Exam: ?Vitals:  ? 07/19/21 2332 07/20/21 0344 07/20/21 0855 07/20/21 1143  ?BP: 128/88 (!) 125/91 119/77 99/68  ?Pulse: (!) 106 (!) 102 (!) 102 93  ?Resp: '19 19 18 17  '$ ?Temp: 98.6 ?F (37 ?C) 98.1  ?F (36.7 ?C) 98.4 ?F (36.9 ?C) 98.1 ?F (36.7 ?C)  ?TempSrc:   Oral Oral  ?SpO2: 95% 93% 93% 92%  ?Weight:  91.2 kg    ?Height:      ? ?General exam: Appears calm and comfortable  ?Respiratory system: Clear to auscultation. Respiratory effort normal. ?Cardiovascular system: Irregularly irregular. No JVD, murmurs, rubs, gallops or clicks. No pedal edema. ?Gastrointestinal system: Abdomen is nondistended, soft and nontender. No organomegaly or masses felt. Normal bowel sounds heard. ?Central nervous system: Alert and oriented. No focal neurological deficits. ?Extremities: Right wrist swelling and tender. ?Skin: No rashes, lesions or ulcers ?Psychiatry: Judgement and insight appear normal. Mood & affect appropriate.  ? ?Data Reviewed: ? ?Lab tests reviewed ? ?Family Communication: wife updated ? ?Disposition: ?Status is: Inpatient ?Remains inpatient appropriate because: severity of disease ? Planned Discharge Destination: Home ? ? ? ?Time spent: 28 minutes ? ?Author: ?Sharen Hones, MD ?07/20/2021 12:13 PM ? ?For on call review [email or web address]  ?

## 2021-07-20 NOTE — Progress Notes (Signed)
Pt scale in room and pt to weight self and report to staff per pt preference. ?

## 2021-07-20 NOTE — Hospital Course (Signed)
Calvin Esparza is a 61 y.o. male with medical history significant of CHF with EF 35-40%, hypertension, hyperlipidemia, GERD, gout, PE and atrial fibrillation on Xarelto, alcohol abuse, who presents with shortness of breath. ?Patient was tested positive for COVID, she also developed acute on chronic systolic congestive heart failure was started on IV Lasix. ?Repeat echocardiogram showed ejection fraction 40 to 45%. ?

## 2021-07-21 LAB — CBC
HCT: 44.3 % (ref 39.0–52.0)
Hemoglobin: 14.6 g/dL (ref 13.0–17.0)
MCH: 30.3 pg (ref 26.0–34.0)
MCHC: 33 g/dL (ref 30.0–36.0)
MCV: 91.9 fL (ref 80.0–100.0)
Platelets: 258 10*3/uL (ref 150–400)
RBC: 4.82 MIL/uL (ref 4.22–5.81)
RDW: 14.8 % (ref 11.5–15.5)
WBC: 7.3 10*3/uL (ref 4.0–10.5)
nRBC: 0 % (ref 0.0–0.2)

## 2021-07-21 LAB — BASIC METABOLIC PANEL
Anion gap: 13 (ref 5–15)
BUN: 23 mg/dL — ABNORMAL HIGH (ref 6–20)
CO2: 30 mmol/L (ref 22–32)
Calcium: 9.2 mg/dL (ref 8.9–10.3)
Chloride: 95 mmol/L — ABNORMAL LOW (ref 98–111)
Creatinine, Ser: 1.09 mg/dL (ref 0.61–1.24)
GFR, Estimated: >60 mL/min (ref >60)
Glucose, Bld: 124 mg/dL — ABNORMAL HIGH (ref 70–99)
Potassium: 3.8 mmol/L (ref 3.5–5.1)
Sodium: 138 mmol/L (ref 135–145)

## 2021-07-21 LAB — MAGNESIUM: Magnesium: 2.1 mg/dL (ref 1.7–2.4)

## 2021-07-21 MED ORDER — DILTIAZEM HCL ER COATED BEADS 180 MG PO CP24: 180.0000 mg | ORAL_CAPSULE | Freq: Every day | ORAL | 0 refills | Status: DC

## 2021-07-21 MED ORDER — DILTIAZEM HCL ER COATED BEADS 180 MG PO CP24
180.0000 mg | ORAL_CAPSULE | Freq: Every day | ORAL | Status: DC
  Administered 2021-07-21: 180 mg via ORAL
  Filled 2021-07-21: qty 1

## 2021-07-21 MED ORDER — PREDNISONE 20 MG PO TABS: ORAL_TABLET | ORAL | 0 refills | Status: AC

## 2021-07-21 MED ORDER — TORSEMIDE 20 MG PO TABS: 20.0000 mg | ORAL_TABLET | Freq: Two times a day (BID) | ORAL | 0 refills | Status: DC

## 2021-07-21 NOTE — Discharge Summary (Signed)
?Physician Discharge Summary ?  ?Patient: Calvin Esparza MRN: [id number] DOB: 1960/09/27  ?Admit date:     07/17/2021  ?Discharge date: 07/21/21  ?Discharge Physician: Sharen Hones  ? ?PCP: [name], MD  ? ?Recommendations at discharge:  ? ?Follow-up with PCP in 1 week. ?Follow-up with cardiology as outpatient. ? ?Discharge Diagnoses: ?Principal Problem: ?  Acute on chronic combined systolic and diastolic CHF (congestive heart failure) ([place]) ?Active Problems: ?  Acute bronchitis ?  Atrial fibrillation with RVR ([place]) ?  Alcohol abuse ?  Pulmonary embolism ([place]) ?  Gout ?  HTN (hypertension), benign ?  Elevated troponin ?  Hypokalemia ?  HLD (hyperlipidemia) ?  COVID-19 virus infection ? ?Resolved Problems: ?  * No resolved hospital problems. * ? ?Hospital Course: ?Calvin Esparza is a 61 y.o. male with medical history significant of CHF with EF 35-40%, hypertension, hyperlipidemia, GERD, gout, PE and atrial fibrillation on Xarelto, alcohol abuse, who presents with shortness of breath. ?Patient was tested positive for COVID, she also developed acute on chronic systolic congestive heart failure was started on IV Lasix. ?Repeat echocardiogram showed ejection fraction 40 to 45%. ? ?Assessment and Plan: ?Acute on chronic combined systolic and diastolic congestive heart failure ?Elevated troponin secondary to CHF. ?Patient baseline ejection fraction was 35 to 40% with grade 1 diastolic dysfunction. ?Repeated echocardiogram showed 40 to 45% ejection fraction. ?Patient initially was given IV Lasix, changed to torsemide 40 mg twice a day yesterday.  Condition further improved, at this point, he will be on torsemide 25 mg twice a day.  Continue beta-blocker.  I will hold ARB as patient has started diltiazem for atrial fibrillation. ? ?COVID viral infection. ?Acute bronchitis ?Condition stable, patient did not have any hypoxemia. ?  ?Alcohol abuse. ?Cocaine abuse. ?Hypokalemia. ?Patient did not develop alcohol  withdrawal.  Potassium normalized ?  ?Chronic atrial fibrillation with RVR. ?History of PE ?Patient currently on 200 mg metoprolol daily, also on 180 mg of diltiazem.  Heart rate controlled better.  Follow-up with cardiology in 1 to 2 weeks.  Continue anticoagulation. ?  ?Right wrist gouty arthritis. ?Patient had recurrent gouty arthritis, he has a swollen right wrist.  Will finish a short course of steroid taper.  Condition much improved ? ?  ? ? ?Consultants: None ?Procedures performed: None  ?Disposition: Home ?Diet recommendation:  ?Discharge Diet Orders (From admission, onward)  ? ?  Start     Ordered  ? 07/21/21 0000  Diet - low sodium heart healthy       ? 07/21/21 1413  ? ?  ?  ? ?  ? ?Cardiac diet ?DISCHARGE MEDICATION: ?Allergies as of 07/21/2021   ?No Known Allergies ?  ? ?  ?Medication List  ?  ? ?STOP taking these medications   ? ?colchicine-probenecid 0.5-500 MG tablet ?  ?HYDROcodone-acetaminophen 5-325 MG tablet ?Commonly known as: NORCO/VICODIN ?  ?losartan 25 MG tablet ?Commonly known as: COZAAR ?  ?rosuvastatin 5 MG tablet ?Commonly known as: CRESTOR ?  ? ?  ? ?TAKE these medications   ? ?BC FAST PAIN RELIEF PO ?Take 1 packet by mouth daily as needed. ?  ?colchicine 0.6 MG tablet ?Take 1 tablet (0.6 mg total) by mouth 2 (two) times daily. ?  ?digoxin 0.25 MG tablet ?Commonly known as: LANOXIN ?Take 0.5 tablet (0.125 mg) by mouth once daily ?  ?diltiazem 180 MG 24 hr capsule ?Commonly known as: CARDIZEM CD ?Take 1 capsule (180 mg total) by mouth daily. ?Start taking  on: Jul 22, 2021 ?  ?meloxicam 15 MG tablet ?Commonly known as: MOBIC ?Take 15 mg by mouth daily as needed. ?  ?metoprolol 200 MG 24 hr tablet ?Commonly known as: TOPROL-XL ?Take 1 tablet (200 mg total) by mouth daily. ?  ?omeprazole 20 MG capsule ?Commonly known as: PriLOSEC ?Take 1 capsule (20 mg total) by mouth 2 (two) times daily before a meal. ?  ?potassium chloride SA 20 MEQ tablet ?Commonly known as: Klor-Con M20 ?Take 0.5 tablets  (10 mEq total) by mouth daily. ?  ?predniSONE 20 MG tablet ?Commonly known as: DELTASONE ?Take 1 tablet (20 mg total) by mouth daily with breakfast for 2 days, THEN 0.5 tablets (10 mg total) daily with breakfast for 2 days. ?Start taking on: Jul 22, 2021 ?  ?torsemide 20 MG tablet ?Commonly known as: DEMADEX ?Take 1 tablet (20 mg total) by mouth 2 (two) times daily. ?What changed: when to take this ?  ?Xarelto 20 MG Tabs tablet ?Generic drug: rivaroxaban ?TAKE 1 TABLET (20 MG TOTAL) BY MOUTH DAILY WITH SUPPER. ?  ? ?  ? ? Follow-up Information   ? ? [name], MD Follow up in 1 week(s).   ?Specialty: Internal Medicine ?Contact information: ?[place] [address] ?[phone] ? ? ?  ?  ? ? Paraschos, Alexander, MD Follow up in 2 week(s).   ?Specialty: Cardiology ?Contact information: ?[facility] [address] ?[phone] ? ? ?  ?  ? ?  ?  ? ?  ? ?Discharge Exam: ?Filed Weights  ? 07/17/21 0344 07/20/21 0344  ?Weight: 95.3 kg 91.2 kg  ? ?General exam: Appears calm and comfortable  ?Respiratory system: Clear to auscultation. Respiratory effort normal. ?Cardiovascular system: Irregular. No JVD, murmurs, rubs, gallops or clicks. No pedal edema. ?Gastrointestinal system: Abdomen is nondistended, soft and nontender. No organomegaly or masses felt. Normal bowel sounds heard. ?Central nervous system: Alert and oriented. No focal neurological deficits. ?Extremities: Symmetric 5 x 5 power. ?Skin: No rashes, lesions or ulcers ?Psychiatry: Judgement and insight appear normal. Mood & affect appropriate.  ? ? ?Condition at discharge: good ? ?The results of significant diagnostics from this hospitalization (including imaging, microbiology, ancillary and laboratory) are listed below for reference.  ? ?Imaging Studies: ?DG Chest 2 View ? ?Result Date: 07/17/2021 ?CLINICAL DATA:  Chest pain with shortness of breath EXAM: CHEST -  2 VIEW COMPARISON:  02/24/2010 . FINDINGS: Normal heart size and mediastinal contours. No acute infiltrate or edema. No effusion or pneumothorax. Spondylosis with multi-level bridging. Remote right rib fracture. No acute osseous findings. IMPRESSION: No active cardiopulmonary disease. Electronically Signed   By: Jorje Guild M.D.   On: 07/17/2021 04:15  ? ?ECHOCARDIOGRAM COMPLETE ? ?Result Date: 07/18/2021 ?   ECHOCARDIOGRAM REPORT   Patient Name:   EVEREST BROD Date of Exam: 07/18/2021 Medical Rec #:  [id number]        Height:       70.0 in Accession #:    [id number]       Weight:       210.0 lb Date of Birth:  09/10/1960       BSA:          2.131 m? Patient Age:    34 years         BP:           131/87 mmHg Patient Gender: M  HR:           90 bpm. Exam Location:  [facility] Procedure: 2D Echo, Color Doppler, Cardiac Doppler and Intracardiac            Opacification Agent Indications:     I50.21 congestive heart failure-Acute Systolic  History:         Patient has prior history of Echocardiogram examinations.                  HFrEF. Hx of alcohol use.  Sonographer:     Charmayne Sheer Referring Phys:  [place] Diagnosing Phys: Ida Rogue MD  Sonographer Comments: Technically difficult study due to poor echo windows. IMPRESSIONS  1. Left ventricular ejection fraction, by estimation, is 40 to 45%. The left ventricle has mildly decreased function. The left ventricle demonstrates global hypokinesis. There is mild left ventricular hypertrophy. Left ventricular diastolic parameters are indeterminate.  2. Right ventricular systolic function is normal. The right ventricular size is normal. Tricuspid regurgitation signal is inadequate for assessing PA pressure.  3. Left atrial size was moderately dilated.  4. The mitral valve is normal in structure. Mild mitral valve regurgitation. No evidence of mitral stenosis.  5. The aortic valve is normal in structure. Aortic valve regurgitation is mild. No aortic  stenosis is present.  6. The inferior vena cava is normal in size with greater than 50% respiratory variability, suggesting right atrial pressure of 3 mmHg. FINDINGS  Left Ventricle: Left ventricular ejection fra

## 2021-07-21 NOTE — TOC Transition Note (Signed)
Transition of Care (TOC) - CM/SW Discharge Note ? ? ?Patient Details  ?Name: Calvin Esparza ?MRN: [id number] ?Date of Birth: 09/09/1960 ? ?Transition of Care (TOC) CM/SW Contact:  ?Izola Price, RN ?Phone Number: ?07/21/2021, 3:21 PM ? ? ?Clinical Narrative: 07/21/21: Patient discharged to home/self care with family transport home. SA resources provided per [facility] consult by prior CM notes. Simmie Davies RN CM   ? ? ? ?Final next level of care: Home/Self Care ?Barriers to Discharge: Barriers Resolved ? ? ?Patient Goals and CMS Choice ?  ?  ?  ? ?Discharge Placement ?  ?           ?  ?  ?  ?  ? ?Discharge Plan and Services ?  ?  ?           ?DME Arranged: N/A ?DME Agency: NA ?  ?  ?  ?HH Arranged: NA ?[place] Agency: NA ?  ?  ?  ? ?Social Determinants of Health (SDOH) Interventions ?  ? ? ?Readmission Risk Interventions ?   ? View : No data to display.  ?  ?  ?  ? ? ? ? ? ?

## 2021-07-21 NOTE — Progress Notes (Signed)
Discharge instructions provided to patient. All medications, follow up appointments, and discharge instructions provided. IV out. Monitor off CCMD notified. Discharging home with family.  ?Era Bumpers, RN  ?

## 2021-07-22 ENCOUNTER — Other Ambulatory Visit: Payer: Self-pay | Admitting: Family

## 2021-07-22 ENCOUNTER — Telehealth: Payer: Self-pay | Admitting: Family

## 2021-07-22 LAB — CULTURE, BLOOD (ROUTINE X 2)
Culture: NO GROWTH
Culture: NO GROWTH

## 2021-07-22 MED ORDER — TORSEMIDE 20 MG PO TABS: 20.0000 mg | ORAL_TABLET | Freq: Two times a day (BID) | ORAL | 5 refills | Status: DC

## 2021-07-22 NOTE — Progress Notes (Signed)
Patient was not discharged with RX for torsemide so this was provided today. Has HF appt in 4 days.  ?

## 2021-07-22 NOTE — Telephone Encounter (Signed)
LVM for patient after he called asking what to do about his torsemide medication as they did not refill when patient was discharged and he doesn't see [place] till Friday. Notified patient that Otila Kluver [facility] and to take 1 twice a day. ? ? ? ?Edelmira Gallogly, NT ?

## 2021-07-23 ENCOUNTER — Telehealth: Payer: Self-pay

## 2021-07-23 NOTE — Telephone Encounter (Signed)
? ?  [facility] Discharge Phone call ? ?Call to patient, left message to return call. ? ?Tawfiq called back, states he still is short of breath but has not changed, he was SOB when discharged. Is also fatigued. Discussed gradually increasing activity and to listen to his body-when tired/SOB to rest.  He voices understanding. ? ?He is weighing himself and weight is steady at 197#. ? ?He was able to get the prescription of torsemide that was omitted from his discharge meds.  He states he took one last evening and that was a mistake as he was up all night "peeing:. ? ?He has appointment in the heart failure clinic on Friday May 12 at 9 AM.  No problem with transportation.  Also discussed on his charge sheet he was to make appointment with Dr. Josefa Half and also has appointment with Dr. Rockey Situ on May 22.  Mohsin states that Dr. Rockey Situ is his regular cardiologist.  Instructed to disregard other appointment he was to schedule.  He will schedule his appointment with his primary doctor after he sees Otila Kluver on Friday. ? ?He had no further questions. ? ?Pricilla Riffle RN [facility] ?

## 2021-07-25 NOTE — Progress Notes (Signed)
? Patient ID: Calvin Esparza, male    DOB: 1960/04/24, 61 y.o.   MRN: [id number] ? ?HPI ? ?Calvin Esparza is a 61 y/o male with a history of atrial fibrillation, PE, alcohol use, tobacco use (chewing tobacco) and chronic heart failure.  ? ?Echo report from 07/18/21 reviewed and showed an EF of 40-45% along with mild LVH, mild Calvin and moderate LAE. Echo report from 06/08/19 reviewed and showed an EF of 35-40% along with mild LVH and mild LAE.  ? ?Admitted 07/17/21 due to SOB due to HF exacerbation. Initially given IV lasix with transition to oral diuretics. Elevated troponin thought to be due to demand ischemia. Discharged after 4 days.  ? ?He presents today for a follow-up visit with a chief complaint of minimal fatigue upon moderate exertion. Describes this as chronic in nature. He has associated shortness of breath, chronic back pain and anxiety along with this. Denies any dizziness, abdominal distention, palpitations, pedal edema, chest pain, cough or weight gain.  ? ?He has not taken any of his medications yet today. Torsemide is ordered as '20mg'$  BID but he says that he generally doesn't take but '20mg'$  daily with occasionally '40mg'$ .  ? ?Admits that he knows that he's drinking "too much fluids". He drinks 6-8 bottles of water daily, 12-24 ounces of Mtn Dew and 6-8 cans (12 ounces) of beer daily.  ? ?Past Medical History:  ?Diagnosis Date  ? Alcohol abuse   ? Chronic pain   ? COVID-19   ? a. 02/2019  ? HFrEF (heart failure with reduced ejection fraction) ([place])   ? a. 05/2019 Echo: EF 35-40%.  ? History of medication noncompliance   ? Longstanding persistent atrial fibrillation ([place])   ? a. Dx 03/2018; b. 05/2019 recurrent Afib in setting of PE. CHA2DS2VASc = 1-->Eliquis later changed to xarelto; b. 07/2019 s/p DCCV (150J); d. 08/2019 admit w/ AF RVR in settting of noncompliance - longstanding persistent afib since on bb/ccb rx.  ? NICM (nonischemic cardiomyopathy) ([place])   ? a. 05/2019 Echo: EF 35-40%, gr1 DD. Mod enlarged RV.  Mildly dil LA. Mild to mod dil RA. Triv AI; b. 05/2019 MV: EF 32%, no ischemia.  ? Pulmonary embolism ([place])   ? a. 05/2019 CTA Chest: mild amt of PE w/in a lower lobe branch of RPA; b. 08/2019 CTA Chest: Small PE in RML and RLL PA.  ? ?Past Surgical History:  ?Procedure Laterality Date  ? CARDIOVERSION N/A 07/29/2019  ? Procedure: CARDIOVERSION;  Surgeon: Minna Merritts, MD;  Location: [facility] ORS;  Service: Cardiovascular;  Laterality: N/A;  ? COLONOSCOPY WITH PROPOFOL N/A 05/24/2020  ? Procedure: COLONOSCOPY WITH PROPOFOL;  Surgeon: Lin Landsman, MD;  Location: [facility] ENDOSCOPY;  Service: Gastroenterology;  Laterality: N/A;  ? ESOPHAGOGASTRODUODENOSCOPY (EGD) WITH PROPOFOL N/A 02/27/2020  ? Procedure: ESOPHAGOGASTRODUODENOSCOPY (EGD) WITH PROPOFOL;  Surgeon: Lin Landsman, MD;  Location: [facility] ENDOSCOPY;  Service: Gastroenterology;  Laterality: N/A;  ? ESOPHAGOGASTRODUODENOSCOPY (EGD) WITH PROPOFOL N/A 05/24/2020  ? Procedure: ESOPHAGOGASTRODUODENOSCOPY (EGD) WITH PROPOFOL;  Surgeon: Lin Landsman, MD;  Location: [facility] ENDOSCOPY;  Service: Gastroenterology;  Laterality: N/A;  ? ?Family History  ?Problem Relation Age of Onset  ? Bone cancer Father   ? Stroke Father   ? Diabetes Sister   ? ?Social History  ? ?Tobacco Use  ? Smoking status: Never  ? Smokeless tobacco: Current  ?  Types: Chew  ?Substance Use Topics  ? Alcohol use: Yes  ?  Alcohol/week: 24.0 standard drinks  ?  Types: 24 Cans of beer per week  ?  Comment: 6 beers daily, weekend drinks more-  ? ?No Known Allergies ? ?Prior to Admission medications   ?Medication Sig Start Date End Date Taking? Authorizing Provider  ?Aspirin-Caffeine (BC FAST PAIN RELIEF PO) Take 1 packet by mouth daily as needed.   Yes [provider]  ?colchicine 0.6 MG tablet Take 1 tablet (0.6 mg total) by mouth 2 (two) times daily. 05/01/21 05/01/22 Yes Johnn Hai, PA-C  ?digoxin (LANOXIN) 0.25 MG tablet Take 0.5 tablet (0.125 mg) by mouth once daily 03/07/21   Yes Gollan, Kathlene November, MD  ?diltiazem (CARDIZEM CD) 180 MG 24 hr capsule Take 1 capsule (180 mg total) by mouth daily. 07/22/21  Yes Sharen Hones, MD  ?meloxicam (MOBIC) 15 MG tablet Take 15 mg by mouth daily as needed. 11/09/20  Yes [provider]  ?metoprolol (TOPROL-XL) 200 MG 24 hr tablet Take 1 tablet (200 mg total) by mouth daily. 12/03/20  Yes Furth, Cadence H, PA-C  ?omeprazole (PRILOSEC) 20 MG capsule Take 1 capsule (20 mg total) by mouth 2 (two) times daily before a meal. 02/27/20  Yes Wouk, Ailene Rud, MD  ?potassium chloride SA (KLOR-CON M20) 20 MEQ tablet Take 0.5 tablets (10 mEq total) by mouth daily. 12/10/20  Yes Minna Merritts, MD  ?predniSONE (DELTASONE) 20 MG tablet Take 1 tablet (20 mg total) by mouth daily with breakfast for 2 days, THEN 0.5 tablets (10 mg total) daily with breakfast for 2 days. 07/22/21 07/26/21 Yes Sharen Hones, MD  ?torsemide (DEMADEX) 20 MG tablet Take 1 tablet (20 mg total) by mouth 2 (two) times daily. 07/22/21  Yes Alisa Graff, FNP  ?XARELTO 20 MG TABS tablet TAKE 1 TABLET (20 MG TOTAL) BY MOUTH DAILY WITH SUPPER. 02/04/21  Yes Gollan, Kathlene November, MD  ? ? ? ?Review of Systems  ?Constitutional:  Positive for fatigue (minimal). Negative for appetite change.  ?HENT:  Negative for congestion, postnasal drip and sore throat.   ?Eyes: Negative.   ?Respiratory:  Positive for shortness of breath. Negative for cough and chest tightness.   ?Cardiovascular:  Negative for chest pain, palpitations and leg swelling.  ?Gastrointestinal:  Negative for abdominal distention and abdominal pain.  ?Endocrine: Negative.   ?Genitourinary: Negative.   ?Musculoskeletal:  Positive for back pain. Negative for neck pain.  ?Skin: Negative.   ?Allergic/Immunologic: Negative.   ?Neurological:  Negative for dizziness and light-headedness.  ?Hematological:  Negative for adenopathy. Does not bruise/bleed easily.  ?Psychiatric/Behavioral:  Negative for dysphoric mood and sleep disturbance (sleeping  on 2 pillows). The patient is nervous/anxious.   ? ?Vitals:  ? 07/26/21 0852  ?BP: (!) 135/106  ?Pulse: 90  ?Resp: 20  ?SpO2: 98%  ?Weight: 207 lb (93.9 kg)  ?Height: '5\' 10"'$  (1.778 m)  ? ?Wt Readings from Last 3 Encounters:  ?07/26/21 207 lb (93.9 kg)  ?07/20/21 201 lb 1.6 oz (91.2 kg)  ?05/01/21 211 lb 3.2 oz (95.8 kg)  ? ?Lab Results  ?Component Value Date  ? CREATININE 1.09 07/21/2021  ? CREATININE 1.13 07/20/2021  ? CREATININE 0.95 07/19/2021  ? ?Physical Exam ?Vitals and nursing note reviewed.  ?Constitutional:   ?   Appearance: He is well-developed.  ?HENT:  ?   Head: Normocephalic and atraumatic.  ?Neck:  ?   Vascular: No JVD.  ?Cardiovascular:  ?   Rate and Rhythm: Normal rate. Rhythm irregular.  ?Pulmonary:  ?   Effort: Pulmonary effort is normal. No respiratory distress.  ?  Breath sounds: No wheezing or rales.  ?Abdominal:  ?   General: There is no distension.  ?   Palpations: Abdomen is soft.  ?   Tenderness: There is no abdominal tenderness.  ?Musculoskeletal:  ?   Cervical back: Normal range of motion and neck supple.  ?   Right lower leg: No tenderness. No edema.  ?   Left lower leg: No tenderness. No edema.  ?Skin: ?   General: Skin is warm and dry.  ?Neurological:  ?   General: No focal deficit present.  ?   Mental Status: He is alert and oriented to person, place, and time.  ?Psychiatric:     ?   Mood and Affect: Mood is anxious.     ?   Behavior: Behavior normal.  ? ?Assessment & Plan: ? ?1: Chronic heart failure with reduced ejection fraction- ?- NYHA class II ?- euvolemic today ?- weighing daily; reminded to call for an overnight weight gain of >2 pounds or a weekly weight gain of >5 pounds ?- weight up 6 pounds from last visit here 2 months ago ?- not adding salt to his food and tries to be aware of his sodium intake although he admits that he eats saltier foods when he's working ?- discussed the importance of keeping his daily fluid intake to ~ 64 ounces; he is drinking anywhere from 190-250  ounces daily (water, Mtn Dew and alcohol) ?- on GDMT of metoprolol ?- will start farxiga '10mg'$  daily; 30 day voucher provided ?- check BMP next visit ?- take '20mg'$  torsemide daily and '20mg'$  PRN; may use it P

## 2021-07-26 ENCOUNTER — Ambulatory Visit: Payer: 59 | Attending: Family | Admitting: Family

## 2021-07-26 ENCOUNTER — Encounter: Payer: Self-pay | Admitting: Family

## 2021-07-26 VITALS — BP 135/106 | HR 90 | Resp 20 | Ht 70.0 in | Wt 207.0 lb

## 2021-07-26 DIAGNOSIS — F419 Anxiety disorder, unspecified: Secondary | ICD-10-CM | POA: Insufficient documentation

## 2021-07-26 DIAGNOSIS — Z8616 Personal history of COVID-19: Secondary | ICD-10-CM | POA: Insufficient documentation

## 2021-07-26 DIAGNOSIS — M549 Dorsalgia, unspecified: Secondary | ICD-10-CM | POA: Insufficient documentation

## 2021-07-26 DIAGNOSIS — Z79899 Other long term (current) drug therapy: Secondary | ICD-10-CM | POA: Diagnosis not present

## 2021-07-26 DIAGNOSIS — I110 Hypertensive heart disease with heart failure: Secondary | ICD-10-CM | POA: Insufficient documentation

## 2021-07-26 DIAGNOSIS — Z86711 Personal history of pulmonary embolism: Secondary | ICD-10-CM | POA: Diagnosis not present

## 2021-07-26 DIAGNOSIS — F1010 Alcohol abuse, uncomplicated: Secondary | ICD-10-CM

## 2021-07-26 DIAGNOSIS — F1090 Alcohol use, unspecified, uncomplicated: Secondary | ICD-10-CM | POA: Insufficient documentation

## 2021-07-26 DIAGNOSIS — I10 Essential (primary) hypertension: Secondary | ICD-10-CM | POA: Diagnosis not present

## 2021-07-26 DIAGNOSIS — G8929 Other chronic pain: Secondary | ICD-10-CM | POA: Insufficient documentation

## 2021-07-26 DIAGNOSIS — I4891 Unspecified atrial fibrillation: Secondary | ICD-10-CM | POA: Diagnosis not present

## 2021-07-26 DIAGNOSIS — Z7901 Long term (current) use of anticoagulants: Secondary | ICD-10-CM | POA: Diagnosis not present

## 2021-07-26 DIAGNOSIS — I4819 Other persistent atrial fibrillation: Secondary | ICD-10-CM

## 2021-07-26 DIAGNOSIS — I5020 Unspecified systolic (congestive) heart failure: Secondary | ICD-10-CM | POA: Diagnosis not present

## 2021-07-26 DIAGNOSIS — I5022 Chronic systolic (congestive) heart failure: Secondary | ICD-10-CM | POA: Diagnosis not present

## 2021-07-26 DIAGNOSIS — F17220 Nicotine dependence, chewing tobacco, uncomplicated: Secondary | ICD-10-CM | POA: Insufficient documentation

## 2021-07-26 DIAGNOSIS — R69 Illness, unspecified: Secondary | ICD-10-CM | POA: Diagnosis not present

## 2021-07-26 MED ORDER — DAPAGLIFLOZIN PROPANEDIOL 10 MG PO TABS: 10.0000 mg | ORAL_TABLET | Freq: Every day | ORAL | 5 refills | Status: DC

## 2021-07-26 NOTE — Patient Instructions (Addendum)
Continue weighing daily and call for an overnight weight gain of 3 pounds or more or a weekly weight gain of more than 5 pounds.   If you have voicemail, please make sure your mailbox is cleaned out so that we may leave a message and please make sure to listen to any voicemails.    Start taking farxiga as 1 tablet daily    

## 2021-07-31 NOTE — Progress Notes (Deleted)
No current cardiology Office Note  Date:  07/31/2021   ID:  Calvin Esparza, DOB 1960-12-28, MRN [id number]  PCP:  [name], MD   No chief complaint on file.   HPI:  [name] is a 61 year old gentleman with past medical history of Alcohol abuse Chronic pain, knee pain emergency room August 28, 2018 for chest pain, dizzy, atrial fibrillation Heart rate from 110 up to 130 bpm CTA showing PE lower lobe branch on the right March 2021 Atrial fibrillation March 2021 COVID infection December 2020 Who presents for follow-up of his likely permanent atrial fibrillation  Last seen by myself in clinic November 2022 Diltiazem had been previously held, was on metoprolol 200 daily, Was in atrial fibrillation with rapid rate though asymptomatic Was taking high-dose torsemide, more than prescribed Reported having pain in his legs, vein swelling, inside groins Tender right groin, hamstring Working as a Software engineer at [employer], 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 Left ventricular ejection fraction, by estimation, is 40 to 45%. The  left ventricle has mildly decreased function. The left ventricle  demonstrates global hypokinesis. There is mild left ventricular  hypertrophy. Left ventricular diastolic parameters  are indeterminate.   2. Right ventricular systolic function is normal. The right ventricular  size is normal. Tricuspid regurgitation signal is inadequate for assessing  PA pressure.   3. Left atrial size was moderately dilated.   4. The mitral valve is normal in  structure. Mild mitral valve  regurgitation. No evidence of mitral stenosis.   5. The aortic valve is normal in structure. Aortic valve regurgitation is  mild. No aortic stenosis is present.   6. The inferior vena cava is normal in size with greater than 50%  respiratory variability, suggesting right atrial pressure of 3 mmHg.    EKG personally reviewed by myself on todays visit Atrial fibrillation rate 109  Other past medical history reviewed Last seen in clinic September 2021 At that time was in atrial fibrillation In the hospital February 25, 2020 with atrial fibrillation with RVR rate 177 bpm Secondary to dysphagia has not been taking his anticoagulation or any of his pills Had EGD showing multiple gastric erosions, portal gastropathy Started on PPI twice daily  Seen by one of our providers March 01, 2020 Was on metoprolol succinate 100 twice daily and diltiazem 180 daily, Xarelto   Other past medical history reviewed Seen in emergency room October 10, 2019 A. fib with RVR Admission to the hospital Heatstroke, dehydration Was given IV fluids, heart rate improved, felt better, no significant medication changes were made  Seen in clinic in October 14, 2019, atrial fibrillation at that time  Seen in emergency room December 15, 2019 for shortness of breath  not taking his diltiazem for several days out of fear of interaction with colchicine blood pressure is elevated 160/113 pulse rate 119 was given a dose of IV Lasix and a dose of diltiazem, discharged home  Covid infection December 2020   PMH:   has a past medical history of Alcohol abuse, Chronic pain, COVID-19, HFrEF (heart failure with reduced ejection fraction) ([place]), History of  medication noncompliance, Longstanding persistent atrial fibrillation ([place]), NICM (nonischemic cardiomyopathy) ([place]), and Pulmonary embolism ([place]).  PSH:    Past Surgical History:  Procedure Laterality Date   CARDIOVERSION N/A 07/29/2019    Procedure: CARDIOVERSION;  Surgeon: Minna Merritts, MD;  Location: [facility] ORS;  Service: Cardiovascular;  Laterality: N/A;   COLONOSCOPY WITH PROPOFOL N/A 05/24/2020   Procedure: COLONOSCOPY WITH PROPOFOL;  Surgeon: Lin Landsman, MD;  Location: [facility] ENDOSCOPY;  Service: Gastroenterology;  Laterality: N/A;   ESOPHAGOGASTRODUODENOSCOPY (EGD) WITH PROPOFOL N/A 02/27/2020   Procedure: ESOPHAGOGASTRODUODENOSCOPY (EGD) WITH PROPOFOL;  Surgeon: Lin Landsman, MD;  Location: [facility] ENDOSCOPY;  Service: Gastroenterology;  Laterality: N/A;   ESOPHAGOGASTRODUODENOSCOPY (EGD) WITH PROPOFOL N/A 05/24/2020   Procedure: ESOPHAGOGASTRODUODENOSCOPY (EGD) WITH PROPOFOL;  Surgeon: Lin Landsman, MD;  Location: [facility] ENDOSCOPY;  Service: Gastroenterology;  Laterality: N/A;    Current Outpatient Medications  Medication Sig Dispense Refill   Aspirin-Caffeine (BC FAST PAIN RELIEF PO) Take 1 packet by mouth daily as needed.     colchicine 0.6 MG tablet Take 1 tablet (0.6 mg total) by mouth 2 (two) times daily. 30 tablet 0   dapagliflozin propanediol (FARXIGA) 10 MG TABS tablet Take 1 tablet (10 mg total) by mouth daily before breakfast. 30 tablet 5   digoxin (LANOXIN) 0.25 MG tablet Take 0.5 tablet (0.125 mg) by mouth once daily 90 tablet 3   diltiazem (CARDIZEM CD) 180 MG 24 hr capsule Take 1 capsule (180 mg total) by mouth daily. 30 capsule 0   meloxicam (MOBIC) 15 MG tablet Take 15 mg by mouth daily as needed.     metoprolol (TOPROL-XL) 200 MG 24 hr tablet Take 1 tablet (200 mg total) by mouth daily. 90 tablet 1   omeprazole (PRILOSEC) 20 MG capsule Take 1 capsule (20 mg total) by mouth 2 (two) times daily before a meal. 60 capsule 1   potassium chloride SA (KLOR-CON M20) 20 MEQ tablet Take 0.5 tablets (10 mEq total) by mouth daily. 45 tablet 0   torsemide (DEMADEX) 20 MG tablet Take 1 tablet (20 mg total) by mouth 2 (two) times daily. 60 tablet 5   XARELTO 20 MG TABS tablet TAKE 1 TABLET (20 MG  TOTAL) BY MOUTH DAILY WITH SUPPER. 90 tablet 1   No current facility-administered medications for this visit.    Allergies:   Patient has no known allergies.   Social History:  The patient  reports that he has never smoked. His smokeless tobacco use includes chew. He reports current alcohol use of about 24.0 standard drinks per week. He reports that he does not currently use drugs.   Family History:   family history includes Bone cancer in his father; Diabetes in his sister; Stroke in his father.    Review of Systems: Review of Systems  Constitutional: Negative.   HENT: Negative.    Eyes: Negative.   Respiratory: Negative.    Cardiovascular: Negative.   Gastrointestinal: Negative.   Musculoskeletal: Negative.   Neurological: Negative.   Psychiatric/Behavioral: Negative.    All other systems reviewed and are negative.  PHYSICAL EXAM: VS:  There were no vitals taken for this visit. , BMI There is no height or weight on file to calculate BMI. Constitutional:  oriented to person, place, and time. No distress.  HENT:  Head: Grossly normal Eyes:  no discharge. No scleral icterus.  Neck: No JVD, no carotid bruits  Cardiovascular: Regular rate and rhythm, no murmurs appreciated Pulmonary/Chest: Clear to auscultation bilaterally, no wheezes or rails  Abdominal: Soft.  no distension.  no tenderness.  Musculoskeletal: Normal range of motion Neurological:  normal muscle tone. Coordination normal. No atrophy Skin: Skin warm and dry Psychiatric: normal affect, pleasant   Recent Labs: 07/17/2021: B Natriuretic Peptide 545.6 07/21/2021: BUN 23; Creatinine, Ser 1.09; Hemoglobin 14.6; Magnesium 2.1; Platelets 258; Potassium 3.8; Sodium 138    Lipid Panel Lab Results  Component Value Date   CHOL 209 (H) 07/10/2020   HDL 57 07/10/2020   LDLCALC 133 (H) 07/10/2020   TRIG 107 07/10/2020      Wt Readings from Last 3 Encounters:  07/26/21 207 lb (93.9 kg)  07/20/21 201 lb 1.6 oz (91.2  kg)  05/01/21 211 lb 3.2 oz (95.8 kg)     ASSESSMENT AND PLAN:  Problem List Items Addressed This Visit   None Atrial fibrillation, persistent  metoprolol succinate 200 twice daily,  Rate continues to run high without the diltiazem Start digoxin for rate control, check a level in 2-3 weeks On Xarelto  Essential hypertension Blood pressure is well controlled on today's visit. No changes made to the medications.  Pulmonary embolism On anticoagulation On Xarelto  Cardiomyopathy avoid alcohol In atrial fibrillation, permanent likely     Total encounter time more than 30 minutes  Greater than 50% was spent in counseling and coordination of care with the patient    Signed, Esmond Plants, M.D., Ph.D. [place], [place]

## 2021-08-05 ENCOUNTER — Ambulatory Visit: Payer: 59 | Admitting: Cardiovascular Disease

## 2021-08-05 DIAGNOSIS — I4819 Other persistent atrial fibrillation: Secondary | ICD-10-CM

## 2021-08-05 DIAGNOSIS — E785 Hyperlipidemia, unspecified: Secondary | ICD-10-CM

## 2021-08-05 DIAGNOSIS — I428 Other cardiomyopathies: Secondary | ICD-10-CM

## 2021-08-05 DIAGNOSIS — I5020 Unspecified systolic (congestive) heart failure: Secondary | ICD-10-CM

## 2021-08-05 DIAGNOSIS — I10 Essential (primary) hypertension: Secondary | ICD-10-CM

## 2021-08-05 DIAGNOSIS — Z86711 Personal history of pulmonary embolism: Secondary | ICD-10-CM

## 2021-08-23 ENCOUNTER — Encounter: Payer: Self-pay | Admitting: Family

## 2021-08-23 ENCOUNTER — Ambulatory Visit (HOSPITAL_BASED_OUTPATIENT_CLINIC_OR_DEPARTMENT_OTHER): Payer: 59 | Admitting: Family

## 2021-08-23 ENCOUNTER — Other Ambulatory Visit
Admission: RE | Admit: 2021-08-23 | Discharge: 2021-08-23 | Disposition: A | Discharge status: Home / Self Care | Payer: 59 | Attending: Family | Admitting: Family

## 2021-08-23 VITALS — BP 121/78 | HR 109 | Resp 20 | Ht 69.0 in | Wt 210.1 lb

## 2021-08-23 DIAGNOSIS — F1090 Alcohol use, unspecified, uncomplicated: Secondary | ICD-10-CM | POA: Insufficient documentation

## 2021-08-23 DIAGNOSIS — I4891 Unspecified atrial fibrillation: Secondary | ICD-10-CM | POA: Insufficient documentation

## 2021-08-23 DIAGNOSIS — I5020 Unspecified systolic (congestive) heart failure: Secondary | ICD-10-CM | POA: Insufficient documentation

## 2021-08-23 DIAGNOSIS — I5022 Chronic systolic (congestive) heart failure: Secondary | ICD-10-CM | POA: Insufficient documentation

## 2021-08-23 DIAGNOSIS — I4819 Other persistent atrial fibrillation: Secondary | ICD-10-CM

## 2021-08-23 DIAGNOSIS — Z7901 Long term (current) use of anticoagulants: Secondary | ICD-10-CM | POA: Insufficient documentation

## 2021-08-23 DIAGNOSIS — I10 Essential (primary) hypertension: Secondary | ICD-10-CM

## 2021-08-23 DIAGNOSIS — I110 Hypertensive heart disease with heart failure: Secondary | ICD-10-CM | POA: Insufficient documentation

## 2021-08-23 DIAGNOSIS — F1010 Alcohol abuse, uncomplicated: Secondary | ICD-10-CM

## 2021-08-23 DIAGNOSIS — R69 Illness, unspecified: Secondary | ICD-10-CM | POA: Diagnosis not present

## 2021-08-23 LAB — BASIC METABOLIC PANEL
Anion gap: 6 (ref 5–15)
BUN: 17 mg/dL (ref 6–20)
CO2: 28 mmol/L (ref 22–32)
Calcium: 9.7 mg/dL (ref 8.9–10.3)
Chloride: 107 mmol/L (ref 98–111)
Creatinine, Ser: 0.84 mg/dL (ref 0.61–1.24)
GFR, Estimated: >60 mL/min (ref >60)
Glucose, Bld: 106 mg/dL — ABNORMAL HIGH (ref 70–99)
Potassium: 3.9 mmol/L (ref 3.5–5.1)
Sodium: 141 mmol/L (ref 135–145)

## 2021-08-23 NOTE — Progress Notes (Signed)
Patient ID: Calvin Esparza, male    DOB: 05/08/1960, 61 y.o.   MRN: [id number]  HPI  Calvin Esparza is a 61 y/o male with a history of atrial fibrillation, PE, alcohol use, tobacco use (chewing tobacco) and chronic heart failure.   Echo report from 07/18/21 reviewed and showed an EF of 40-45% along with mild LVH, mild Calvin and moderate LAE. Echo report from 06/08/19 reviewed and showed an EF of 35-40% along with mild LVH and mild LAE.   Admitted 07/17/21 due to SOB due to HF exacerbation. Initially given IV lasix with transition to oral diuretics. Elevated troponin thought to be due to demand ischemia. Discharged after 4 days.   He presents today for a follow-up visit with a chief complaint of minimal fatigue upon moderate exertion. He describes this as chronic in nature. He has "very little" shortness of breath, chronic joint pain and slight weight gain along with this. He denies any dizziness, abdominal distention, palpitations, pedal edema, chest pain, cough or wheezing.   Was a little dizzy when he first started farxiga but that has resolved. Weighing himself daily and has been trying to decrease his fluid intake.   Chewing less tobacco and drinking less alcohol than before. Did eat some pizza the other day and feels like that is where his weight gain came from as he normally doesn't eat pizza.   Past Medical History:  Diagnosis Date   Alcohol abuse    Chronic pain    COVID-19    a. 02/2019   HFrEF (heart failure with reduced ejection fraction) ([place])    a. 05/2019 Echo: EF 35-40%.   History of medication noncompliance    Longstanding persistent atrial fibrillation ([place])    a. Dx 03/2018; b. 05/2019 recurrent Afib in setting of PE. CHA2DS2VASc = 1-->Eliquis later changed to xarelto; b. 07/2019 s/p DCCV (150J); d. 08/2019 admit w/ AF RVR in settting of noncompliance - longstanding persistent afib since on bb/ccb rx.   NICM (nonischemic cardiomyopathy) ([place])    a. 05/2019 Echo: EF 35-40%, gr1 DD. Mod  enlarged RV. Mildly dil LA. Mild to mod dil RA. Triv AI; b. 05/2019 MV: EF 32%, no ischemia.   Pulmonary embolism ([place])    a. 05/2019 CTA Chest: mild amt of PE w/in a lower lobe branch of RPA; b. 08/2019 CTA Chest: Small PE in RML and RLL PA.   Past Surgical History:  Procedure Laterality Date   CARDIOVERSION N/A 07/29/2019   Procedure: CARDIOVERSION;  Surgeon: Minna Merritts, MD;  Location: [facility] ORS;  Service: Cardiovascular;  Laterality: N/A;   COLONOSCOPY WITH PROPOFOL N/A 05/24/2020   Procedure: COLONOSCOPY WITH PROPOFOL;  Surgeon: Lin Landsman, MD;  Location: [facility] ENDOSCOPY;  Service: Gastroenterology;  Laterality: N/A;   ESOPHAGOGASTRODUODENOSCOPY (EGD) WITH PROPOFOL N/A 02/27/2020   Procedure: ESOPHAGOGASTRODUODENOSCOPY (EGD) WITH PROPOFOL;  Surgeon: Lin Landsman, MD;  Location: [facility] ENDOSCOPY;  Service: Gastroenterology;  Laterality: N/A;   ESOPHAGOGASTRODUODENOSCOPY (EGD) WITH PROPOFOL N/A 05/24/2020   Procedure: ESOPHAGOGASTRODUODENOSCOPY (EGD) WITH PROPOFOL;  Surgeon: Lin Landsman, MD;  Location: [facility] ENDOSCOPY;  Service: Gastroenterology;  Laterality: N/A;   Family History  Problem Relation Age of Onset   Bone cancer Father    Stroke Father    Diabetes Sister    Social History   Tobacco Use   Smoking status: Never   Smokeless tobacco: Current    Types: Chew  Substance Use Topics   Alcohol use: Yes    Alcohol/week: 24.0 standard drinks  of alcohol    Types: 24 Cans of beer per week    Comment: 6 beers daily, weekend drinks more-   No Known Allergies  Prior to Admission medications   Medication Sig Start Date End Date Taking? Authorizing Provider  Aspirin-Caffeine (BC FAST PAIN RELIEF PO) Take 1 packet by mouth daily as needed.   Yes [provider]  colchicine 0.6 MG tablet Take 1 tablet (0.6 mg total) by mouth 2 (two) times daily. 05/01/21 05/01/22 Yes Letitia Neri L, PA-C  dapagliflozin propanediol (FARXIGA) 10 MG TABS tablet Take 1  tablet (10 mg total) by mouth daily before breakfast. 07/26/21  Yes Darylene Price A, FNP  digoxin (LANOXIN) 0.25 MG tablet Take 0.5 tablet (0.125 mg) by mouth once daily 03/07/21  Yes Gollan, Kathlene November, MD  diltiazem (CARDIZEM CD) 180 MG 24 hr capsule Take 1 capsule (180 mg total) by mouth daily. 07/22/21  Yes Sharen Hones, MD  meloxicam (MOBIC) 15 MG tablet Take 15 mg by mouth daily as needed. 11/09/20  Yes [provider]  metoprolol (TOPROL-XL) 200 MG 24 hr tablet Take 1 tablet (200 mg total) by mouth daily. 12/03/20  Yes Furth, Cadence H, PA-C  omeprazole (PRILOSEC) 20 MG capsule Take 1 capsule (20 mg total) by mouth 2 (two) times daily before a meal. 02/27/20  Yes Wouk, Ailene Rud, MD  potassium chloride SA (KLOR-CON M20) 20 MEQ tablet Take 0.5 tablets (10 mEq total) by mouth daily. 12/10/20  Yes Minna Merritts, MD  torsemide (DEMADEX) 20 MG tablet Take 1 tablet (20 mg total) by mouth 2 (two) times daily. Patient taking differently: Take 20 mg by mouth daily. 07/22/21  Yes Meaghan Whistler A, FNP  XARELTO 20 MG TABS tablet TAKE 1 TABLET (20 MG TOTAL) BY MOUTH DAILY WITH SUPPER. 02/04/21  Yes Gollan, Kathlene November, MD   Review of Systems  Constitutional:  Positive for fatigue (minimal). Negative for appetite change.  HENT:  Negative for congestion, postnasal drip and sore throat.   Eyes: Negative.   Respiratory:  Positive for shortness of breath ("very little"). Negative for cough and chest tightness.   Cardiovascular:  Negative for chest pain, palpitations and leg swelling.  Gastrointestinal:  Negative for abdominal distention and abdominal pain.  Endocrine: Negative.   Genitourinary: Negative.   Musculoskeletal:  Positive for arthralgias ("joints all over") and back pain. Negative for neck pain.  Skin: Negative.   Allergic/Immunologic: Negative.   Neurological:  Negative for dizziness and light-headedness.  Hematological:  Negative for adenopathy. Does not bruise/bleed easily.   Psychiatric/Behavioral:  Negative for dysphoric mood and sleep disturbance (sleeping on 1-2 pillows). The patient is nervous/anxious.    Vitals:   08/23/21 0821  BP: 121/78  Pulse: (!) 109  Resp: 20  SpO2: 99%  Weight: 210 lb 2 oz (95.3 kg)  Height: '5\' 9"'$  (1.753 m)   Wt Readings from Last 3 Encounters:  08/23/21 210 lb 2 oz (95.3 kg)  07/26/21 207 lb (93.9 kg)  07/20/21 201 lb 1.6 oz (91.2 kg)   Lab Results  Component Value Date   CREATININE 1.09 07/21/2021   CREATININE 1.13 07/20/2021   CREATININE 0.95 07/19/2021   Physical Exam Vitals and nursing note reviewed.  Constitutional:      Appearance: He is well-developed.  HENT:     Head: Normocephalic and atraumatic.  Neck:     Vascular: No JVD.  Cardiovascular:     Rate and Rhythm: Tachycardia present. Rhythm irregular.  Pulmonary:  Effort: Pulmonary effort is normal. No respiratory distress.     Breath sounds: No wheezing or rales.  Abdominal:     General: There is no distension.     Palpations: Abdomen is soft.     Tenderness: There is no abdominal tenderness.  Musculoskeletal:     Cervical back: Normal range of motion and neck supple.     Right lower leg: No tenderness. No edema.     Left lower leg: No tenderness. No edema.  Skin:    General: Skin is warm and dry.  Neurological:     General: No focal deficit present.     Mental Status: He is alert and oriented to person, place, and time.  Psychiatric:        Mood and Affect: Mood is anxious.        Behavior: Behavior normal.    Assessment & Plan:  1: Chronic heart failure with reduced ejection fraction- - NYHA class II - euvolemic today - weighing daily; reminded to call for an overnight weight gain of >2 pounds or a weekly weight gain of >5 pounds - weight up 3 pounds from last visit here 1 month ago - not adding salt to his food and tries to be aware of his sodium intake although he admits that he recently ate some pizza; was aware that it was high  in salt - discussed the importance of keeping his daily fluid intake to ~ 64 ounces - on GDMT of metoprolol & farxiga - check BMP today since farxiga added at last visit - discussed other GDMT but he prefers to wait until he sees cardiology in a few weeks - take '20mg'$  torsemide daily and hasn't taken any extra since last viist - BNP 07/17/21 was 545.6  2: Atrial fibrillation/ PE- - saw cardiology Rockey Situ) 02/01/21; returns 09/04/21 - currently on digoxin, diltiazem, xarelto and metoprolol - HR in the 100's this morning and he did take his medication before coming  3: HTN- - BP looks good (121/78); he took his medication prior to coming to the appointment today - saw PCP Ouida Sills) 09/26/19 - BMP 07/21/21 reviewed and showed sodium 138, potassium 3.8, creatinine 1.09 and GFR >60  4: Alcohol use- - + chews tobacco but "very little" and went 3 weeks without chewing any - drinks 2-3 beers daily (12 ounces each) which is down from his previous 6-8 beers - congratulated on the reduction and encouraged to continue reducing his alcohol use   Patient did not bring his medications nor a list. Each medication was verbally reviewed with the patient and he was encouraged to bring the bottles to every visit to confirm accuracy of list.    Return in 6 weeks, sooner if needed

## 2021-08-23 NOTE — Patient Instructions (Signed)
Continue weighing daily and call for an overnight weight gain of 3 pounds or more or a weekly weight gain of more than 5 pounds  If you have voicemail, please make sure your mailbox is cleaned out so that we may leave a message and please make sure to listen to any voicemails.    If you receive a satisfaction survey regarding the [facility], please take the time to fill it out. This way we can continue to provide excellent care and make any changes that need to be made.     

## 2021-09-03 NOTE — Progress Notes (Deleted)
Cardiology Office Note    Date:  09/03/2021   ID:  RAD GRAMLING, DOB 1960/07/13, MRN [id number]  PCP:  [name], MD  Cardiologist:  Ida Rogue, MD  Electrophysiologist:  None   Chief Complaint: Hospital follow-up  History of Present Illness:   Calvin Esparza is a 61 y.o. male with history of persistent A-fib status post prior DCCV in 07/2019 with subsequent reversion to A-fib, HFrEF secondary to NICM, recurrent PE in 05/2019 and again in 08/2019, medical nonadherence, cocaine use, alcohol use, and chronic pain who presents for hospital follow-up as outlined below.    He was previously diagnosed with Afib in 03/2018, when he presented with chest pain.  He was placed on beta-blocker and Xarelto.  He subsequently followed up with Dr. Rockey Situ in 09/2018.  He was intermittently compliant.  In 05/2019, he presented to the ED with chest tightness and dyspnea and was found to be in Afib with RVR.  CTA chest showed mild amount of pulmonary embolism within a lower branch of the right pulmonary artery.  Echo showed an EF of 35-40% with grade 1 diastolic dysfunction and moderately enlarged right heart chambers.  Stress testing was undertaken and was nonischemic.  He was discharged home on Eliquis with initial plan for rate control.  He was subsequently switched from Eliquis to Xarelto to improve compliance and underwent DCCV in 07/2019.  He was readmitted in 08/2019 in the setting of dyspnea with recurrent A. fib.  CTA of the chest showed small pulmonary emboli in the right middle and lower lobe pulmonary arteries.  Recurrent PE was felt to be secondary to noncompliance.  He was readmitted in 09/2019 with A. fib and presyncope in the setting of dehydration and acute kidney injury.  Since then, focus has been on rate control.  He was last seen in the office in 01/2021 and remained in A-fib with ventricular rates in the low 100s bpm.  It was noted diltiazem had been stopped at the visit prior.  He  was started on digoxin and continued on Toprol-XL 200 mg daily.  He was admitted to the hospital on 07/2021 with worsening shortness of breath and was found to be COVID-positive.  There was also concern he was volume overloaded.  He was diuresed by internal medicine.  Labs notable for BNP 545, initial and peak high-sensitivity opponent 21, and urine drug screen positive for cocaine.  Echo during the admission demonstrated an EF of 40 to 45%, global hypokinesis, mild LVH, indeterminate LV diastolic function parameters, normal RV systolic function and ventricular cavity size, moderately dilated left atrium, mild mitral gravitation, and an estimated right atrial pressure of 3 mmHg.  He was restarted on Cardizem CD with continuation of Toprol-XL and digoxin.  Since his hospital discharge, he has been followed by the [facility] CHF clinic.  ***   Labs independently reviewed: 08/2021 - potassium 3.9, BUN 17, serum creatinine 0.84 07/2021 - magnesium 2.1, Hgb 14.6, PLT 258 02/2021 - digoxin 1.2 06/2020 - TC 209, TG 107, HDL 57, LDL 133, albumin 4.1, AST/ALT normal 02/2020 - TSH normal  Past Medical History:  Diagnosis Date   Alcohol abuse    Chronic pain    COVID-19    a. 02/2019   HFrEF (heart failure with reduced ejection fraction) ([place])    a. 05/2019 Echo: EF 35-40%.   History of medication noncompliance    Longstanding persistent atrial fibrillation ([place])    a. Dx 03/2018; b. 05/2019 recurrent Afib  in setting of PE. CHA2DS2VASc = 1-->Eliquis later changed to xarelto; b. 07/2019 s/p DCCV (150J); d. 08/2019 admit w/ AF RVR in settting of noncompliance - longstanding persistent afib since on bb/ccb rx.   NICM (nonischemic cardiomyopathy) ([place])    a. 05/2019 Echo: EF 35-40%, gr1 DD. Mod enlarged RV. Mildly dil LA. Mild to mod dil RA. Triv AI; b. 05/2019 MV: EF 32%, no ischemia.   Pulmonary embolism ([place])    a. 05/2019 CTA Chest: mild amt of PE w/in a lower lobe branch of RPA; b. 08/2019 CTA Chest: Small PE  in RML and RLL PA.    Past Surgical History:  Procedure Laterality Date   CARDIOVERSION N/A 07/29/2019   Procedure: CARDIOVERSION;  Surgeon: Minna Merritts, MD;  Location: [facility] ORS;  Service: Cardiovascular;  Laterality: N/A;   COLONOSCOPY WITH PROPOFOL N/A 05/24/2020   Procedure: COLONOSCOPY WITH PROPOFOL;  Surgeon: Lin Landsman, MD;  Location: [facility] ENDOSCOPY;  Service: Gastroenterology;  Laterality: N/A;   ESOPHAGOGASTRODUODENOSCOPY (EGD) WITH PROPOFOL N/A 02/27/2020   Procedure: ESOPHAGOGASTRODUODENOSCOPY (EGD) WITH PROPOFOL;  Surgeon: Lin Landsman, MD;  Location: [facility] ENDOSCOPY;  Service: Gastroenterology;  Laterality: N/A;   ESOPHAGOGASTRODUODENOSCOPY (EGD) WITH PROPOFOL N/A 05/24/2020   Procedure: ESOPHAGOGASTRODUODENOSCOPY (EGD) WITH PROPOFOL;  Surgeon: Lin Landsman, MD;  Location: [facility] ENDOSCOPY;  Service: Gastroenterology;  Laterality: N/A;    Current Medications: No outpatient medications have been marked as taking for the 09/04/21 encounter (Appointment) with Rise Mu, PA-C.    Allergies:   Patient has no known allergies.   Social History   Socioeconomic History   Marital status: Married    Spouse name: Not on file   Number of children: Not on file   Years of education: Not on file   Highest education level: Not on file  Occupational History   Not on file  Tobacco Use   Smoking status: Never   Smokeless tobacco: Current    Types: Chew  Vaping Use   Vaping Use: Never used  Substance and Sexual Activity   Alcohol use: Yes    Alcohol/week: 24.0 standard drinks of alcohol    Types: 24 Cans of beer per week    Comment: 6 beers daily, weekend drinks more-   Drug use: Not Currently   Sexual activity: Not on file  Other Topics Concern   Not on file  Social History Narrative   Lives locally w/ wife.  Does not routinely exercise.  Works full time in Development worker, international aid.   Social Determinants of Health   Financial Resource Strain: Not on file   Food Insecurity: Not on file  Transportation Needs: Not on file  Physical Activity: Not on file  Stress: Not on file  Social Connections: Not on file     Family History:  The patient's family history includes Bone cancer in his father; Diabetes in his sister; Stroke in his father.  ROS:   ROS   EKGs/Labs/Other Studies Reviewed:    Studies reviewed were summarized above. The additional studies were reviewed today:  2D echo 07/18/2021: 1. Left ventricular ejection fraction, by estimation, is 40 to 45%. The  left ventricle has mildly decreased function. The left ventricle  demonstrates global hypokinesis. There is mild left ventricular  hypertrophy. Left ventricular diastolic parameters  are indeterminate.   2. Right ventricular systolic function is normal. The right ventricular  size is normal. Tricuspid regurgitation signal is inadequate for assessing  PA pressure.   3. Left atrial size was moderately dilated.  4. The mitral valve is normal in structure. Mild mitral valve  regurgitation. No evidence of mitral stenosis.   5. The aortic valve is normal in structure. Aortic valve regurgitation is  mild. No aortic stenosis is present.   6. The inferior vena cava is normal in size with greater than 50%  respiratory variability, suggesting right atrial pressure of 3 mmHg. __________  Carlton Adam MPI 06/10/2019: Pharmacological myocardial perfusion imaging study with no significant ischemia Moderate global hypokinesis, EF estimated at 32% No EKG changes concerning for ischemia at peak stress or in recovery. Baseline EKG with atrial fibrillation, rate 100 bpm Low risk scan __________  2D echo 06/04/2019: 1. Left ventricular ejection fraction, by estimation, is 35 to 40%. The  left ventricle has moderately decreased function. The left ventricle  demonstrates global hypokinesis. There is mild left ventricular  hypertrophy. Left ventricular diastolic  parameters are consistent with  Grade I diastolic dysfunction (impaired  relaxation).   2. Right ventricular systolic function is mildly reduced. The right  ventricular size is moderately enlarged.   3. Left atrial size was mildly dilated.   4. Right atrial size was mild to moderately dilated.   5. The mitral valve is grossly normal. No evidence of mitral valve  regurgitation.   6. The aortic valve is tricuspid. Aortic valve regurgitation is trivial.   7. The inferior vena cava is normal in size with greater than 50%  respiratory variability, suggesting right atrial pressure of 3 mmHg.    EKG:  EKG is ordered today.  The EKG ordered today demonstrates ***  Recent Labs: 07/17/2021: B Natriuretic Peptide 545.6 07/21/2021: Hemoglobin 14.6; Magnesium 2.1; Platelets 258 08/23/2021: BUN 17; Creatinine, Ser 0.84; Potassium 3.9; Sodium 141  Recent Lipid Panel    Component Value Date/Time   CHOL 209 (H) 07/10/2020 0845   TRIG 107 07/10/2020 0845   HDL 57 07/10/2020 0845   CHOLHDL 3.7 07/10/2020 0845   LDLCALC 133 (H) 07/10/2020 0845    PHYSICAL EXAM:    VS:  There were no vitals taken for this visit.  BMI: There is no height or weight on file to calculate BMI.  Physical Exam  Wt Readings from Last 3 Encounters:  08/23/21 210 lb 2 oz (95.3 kg)  07/26/21 207 lb (93.9 kg)  07/20/21 201 lb 1.6 oz (91.2 kg)     ASSESSMENT & PLAN:   HFrEF secondary to NICM:  Persistent A-fib:  Recurrent PE:  High risk medication use:  Cocaine/alcohol use:   {Are you ordering a CV Procedure (e.g. stress test, cath, DCCV, TEE, etc)?   Press F2        :[id number]}     Disposition: F/u with Dr. Rockey Situ or an APP in ***.   Medication Adjustments/Labs and Tests Ordered: Current medicines are reviewed at length with the patient today.  Concerns regarding medicines are outlined above. Medication changes, Labs and Tests ordered today are summarized above and listed in the Patient Instructions accessible in Encounters.    Signed, Christell Faith, PA-C 09/03/2021 9:12 AM     [facility] [address] [place], [place] [address] [phone]

## 2021-09-04 ENCOUNTER — Ambulatory Visit: Payer: 59 | Admitting: Physician Assistant

## 2021-10-03 ENCOUNTER — Ambulatory Visit: Payer: 59 | Admitting: Family

## 2021-10-07 NOTE — Progress Notes (Signed)
Cardiology Office Note    Date:  10/08/2021   ID:  Calvin Esparza, DOB [birth year], MRN [id number]  PCP:  Lauro Regulus, MD  Cardiologist:  Julien Nordmann, MD  Electrophysiologist:  None   Chief Complaint: Follow-up  History of Present Illness:   Calvin Esparza is a 61 y.o. male with history of permanent A-fib status post prior cardioversion in 07/2019 with subsequent reversion to A-fib, HFrEF secondary to NICM, COVID in 02/2019, PE in 05/2019 and again in 08/2019, cocaine use, alcohol use, gout, and chronic pain who presents for follow-up of his A-fib and cardiomyopathy.  He was previously diagnosed with A-fib in 03/2018 when he presented with chest pain and dizziness.  He was placed on beta-blocker and Xarelto.  He followed up in 09/2018.  He was intermittently compliant with medical therapy.  In 05/2019, he presented to the ED with chest tightness and dyspnea and was found to be in A-fib with RVR.  CTA of the chest showed mild amount of pulmonary embolism within a lower branch of the right pulmonary artery.  Echo demonstrated an EF of 35 to 40% with grade 1 diastolic dysfunction and moderately enlarged right heart chambers.  Stress testing was undertaken and nonischemic.  He was discharged on Eliquis with initial plan for rate control.  He was subsequently transition to Xarelto to improve medication compliance and underwent DCCV in 07/2019.  He was readmitted in 08/2019 in the setting of dyspnea and recurrent A-fib in the context of medication noncompliance.  CTA of the chest showed small pulmonary emboli in the right middle and lower lobe pulmonary arteries.  His recurrent PE was felt to be secondary to medication nonadherence.  He was readmitted in 09/2019 with A-fib and presyncope in the setting of dehydration with AKI.  Since then, the focus has been on rate control and anticoagulation adherence.  He was seen in the ED in 02/2020 with exertional dyspnea and volume overload.  He was in A-fib  with RVR.  He reported dysphagia and was not adherent with any of his pills except Xarelto.  With resumption of medications and IV diuresis he had significant improvement.  He underwent EGD which showed multiple gastric erosions, mid esophageal white lesion, and portal gastropathy.  No varices were noted.  He was last seen in our office in 01/2021, at which time it was noted at prior visit diltiazem was held and metoprolol was increased.  The patient reported elevated heart rates as well as lower extremity discomfort.  He reported compliance with metoprolol.  He also reported taking torsemide 40 mg daily to sometimes twice daily (our records indicated he should have been taking this every other day).  Primary cardiologist continued Toprol-XL 200 mg daily and started digoxin with continuation of Xarelto.  Following this, labs in 02/2021 showed a digoxin level of 1.2 with recommendation to decrease the dose to 0.125 mg daily with follow-up labs, which have not been obtained.  He was admitted to the hospital in 07/2021 with increased shortness of breath and was found to be COVID-positive.  He was also felt to be volume up and treated with IV Lasix.  Urine drug screen was positive for cocaine.  Initial and peak high-sensitivity troponin of 21.  BNP 545.  Echo showed an EF of 40 to 45%, global hypokinesis, normal RV systolic function and ventricular cavity size, moderately dilated left atrium, mild mitral regurgitation, and an estimated right atrial pressure of 3 mmHg.    Discharge medications  from a cardiac perspective included: Digoxin 0.125 mg daily, diltiazem 180 mg daily, Toprol-XL 200 mg daily, torsemide 20 mg twice daily, Xarelto 20 mg daily, and KCl 10 mill equivalent daily.    Since his hospital discharge, he has been followed by the [facility] CHF clinic.  He comes in doing well from a cardiac perspective, and is without symptoms of angina or decompensation.  No dyspnea, palpitations, dizziness,  presyncope, or syncope.  No falls, hematochezia, or melena.  He is now taking torsemide as needed and has not needed any in the past several days.  No lower extremity swelling, abdominal distention, orthopnea, PND, early satiety.  He is watching his salt intake and drinking less than 2 L of fluid daily.  He has not used cocaine since he tested positive back in the spring of this year.  He is no longer drinking alcohol.  He reports adherence to his cardiac medications.  Overall, he feels like he is doing well from a cardiac perspective.  He continues to struggle with intermittent gout.   Labs independently reviewed: 08/2021 - potassium 3.9, BUN 17, serum creatinine 0.84 07/2021 - magnesium 2.1, Hgb 14.6, PLT 258, urine drug screen positive for cocaine 02/2021 - digoxin 1.2 06/2020 - TC 209, TG 107, HDL 57, LDL 133, albumin 4.1, AST/ALT normal 02/2020 - TSH normal  Past Medical History:  Diagnosis Date   Alcohol abuse    Chronic pain    COVID-19    a. 02/2019   HFrEF (heart failure with reduced ejection fraction) (HCC)    a. 05/2019 Echo: EF 35-40%.   History of medication noncompliance    Longstanding persistent atrial fibrillation (HCC)    a. Dx 03/2018; b. 05/2019 recurrent Afib in setting of PE. CHA2DS2VASc = 1-->Eliquis later changed to xarelto; b. 07/2019 s/p DCCV (150J); d. 08/2019 admit w/ AF RVR in settting of noncompliance - longstanding persistent afib since on bb/ccb rx.   NICM (nonischemic cardiomyopathy) (HCC)    a. 05/2019 Echo: EF 35-40%, gr1 DD. Mod enlarged RV. Mildly dil LA. Mild to mod dil RA. Triv AI; b. 05/2019 MV: EF 32%, no ischemia.   Pulmonary embolism (HCC)    a. 05/2019 CTA Chest: mild amt of PE w/in a lower lobe branch of RPA; b. 08/2019 CTA Chest: Small PE in RML and RLL PA.    Past Surgical History:  Procedure Laterality Date   CARDIOVERSION N/A 07/29/2019   Procedure: CARDIOVERSION;  Surgeon: Antonieta Iba, MD;  Location: [facility] ORS;  Service: Cardiovascular;   Laterality: N/A;   COLONOSCOPY WITH PROPOFOL N/A 05/24/2020   Procedure: COLONOSCOPY WITH PROPOFOL;  Surgeon: Toney Reil, MD;  Location: [facility] ENDOSCOPY;  Service: Gastroenterology;  Laterality: N/A;   ESOPHAGOGASTRODUODENOSCOPY (EGD) WITH PROPOFOL N/A 02/27/2020   Procedure: ESOPHAGOGASTRODUODENOSCOPY (EGD) WITH PROPOFOL;  Surgeon: Toney Reil, MD;  Location: [facility] ENDOSCOPY;  Service: Gastroenterology;  Laterality: N/A;   ESOPHAGOGASTRODUODENOSCOPY (EGD) WITH PROPOFOL N/A 05/24/2020   Procedure: ESOPHAGOGASTRODUODENOSCOPY (EGD) WITH PROPOFOL;  Surgeon: Toney Reil, MD;  Location: [facility] ENDOSCOPY;  Service: Gastroenterology;  Laterality: N/A;    Current Medications: Current Meds  Medication Sig   Aspirin-Caffeine (BC FAST PAIN RELIEF PO) Take 1 packet by mouth daily as needed.   colchicine 0.6 MG tablet Take 1 tablet (0.6 mg total) by mouth 2 (two) times daily.   dapagliflozin propanediol (FARXIGA) 10 MG TABS tablet Take 1 tablet (10 mg total) by mouth daily before breakfast.   metoprolol (TOPROL-XL) 200 MG 24 hr tablet  Take 1 tablet (200 mg total) by mouth daily.   [DISCONTINUED] digoxin (LANOXIN) 0.25 MG tablet Take 0.5 tablet (0.125 mg) by mouth once daily   [DISCONTINUED] diltiazem (CARDIZEM CD) 180 MG 24 hr capsule Take 1 capsule (180 mg total) by mouth daily.   [DISCONTINUED] potassium chloride SA (KLOR-CON M20) 20 MEQ tablet Take 0.5 tablets (10 mEq total) by mouth daily.   [DISCONTINUED] torsemide (DEMADEX) 20 MG tablet Take 1 tablet (20 mg total) by mouth 2 (two) times daily. (Patient taking differently: Take 20 mg by mouth daily.)   [DISCONTINUED] XARELTO 20 MG TABS tablet TAKE 1 TABLET (20 MG TOTAL) BY MOUTH DAILY WITH SUPPER.    Allergies:   Patient has no known allergies.   Social History   Socioeconomic History   Marital status: Married    Spouse name: Not on file   Number of children: Not on file   Years of education: Not on file   Highest education  level: Not on file  Occupational History   Not on file  Tobacco Use   Smoking status: Never   Smokeless tobacco: Current    Types: Chew  Vaping Use   Vaping Use: Never used  Substance and Sexual Activity   Alcohol use: Yes    Alcohol/week: 24.0 standard drinks of alcohol    Types: 24 Cans of beer per week    Comment: 6 beers daily, weekend drinks more-   Drug use: Not Currently   Sexual activity: Not on file  Other Topics Concern   Not on file  Social History Narrative   Lives locally w/ wife.  Does not routinely exercise.  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   Social Determinants of Health   Financial Resource Strain: Not on file  Food Insecurity: Not on file  Transportation Needs: Not on file  Physical Activity: Not on file  Stress: Not on file  Social Connections: Not on file     Family History:  The patient's family history includes Bone cancer in his father; Diabetes in his sister; Stroke in his father.  ROS:   12 -point review of systems is negative unless otherwise noted in the HPI.   EKGs/Labs/Other Studies Reviewed:    Studies reviewed were summarized above. The additional studies were reviewed today:  2D echo 06/08/2019:  1. Left ventricular ejection fraction, by estimation, is 35 to 40%. The  left ventricle has moderately decreased function. The left ventricle  demonstrates global hypokinesis. There is mild left ventricular  hypertrophy. Left ventricular diastolic  parameters are consistent with Grade I diastolic dysfunction (impaired  relaxation).   2. Right ventricular systolic function is mildly reduced. The right  ventricular size is moderately enlarged.   3. Left atrial size was mildly dilated.   4. Right atrial size was mild to moderately dilated.   5. The mitral valve is grossly normal. No evidence of mitral valve  regurgitation.   6. The aortic valve is tricuspid. Aortic valve regurgitation is trivial.   7. The inferior vena cava is normal  in size with greater than 50%  respiratory variability, suggesting right atrial pressure of 3 mmHg. __________  Eugenie Birks MPI 06/10/2019: Pharmacological myocardial perfusion imaging study with no significant ischemia Moderate global hypokinesis, EF estimated at 32% No EKG changes concerning for ischemia at peak stress or in recovery. Baseline EKG with atrial fibrillation, rate 100 bpm Low risk scan ___________  2D echo 07/18/2021: 1. Left ventricular ejection fraction, by estimation, is 40 to 45%. The  left ventricle has mildly decreased function. The left ventricle  demonstrates global hypokinesis. There is mild left ventricular  hypertrophy. Left ventricular diastolic parameters  are indeterminate.   2. Right ventricular systolic function is normal. The right ventricular  size is normal. Tricuspid regurgitation signal is inadequate for assessing  PA pressure.   3. Left atrial size was moderately dilated.   4. The mitral valve is normal in structure. Mild mitral valve  regurgitation. No evidence of mitral stenosis.   5. The aortic valve is normal in structure. Aortic valve regurgitation is  mild. No aortic stenosis is present.   6. The inferior vena cava is normal in size with greater than 50%  respiratory variability, suggesting right atrial pressure of 3 mmHg.    EKG:  EKG is ordered today.  The EKG ordered today demonstrates A-fib with RVR, 121 bpm, nonspecific ST-T changes, largely consistent with prior tracing  Recent Labs: 07/17/2021: B Natriuretic Peptide 545.6 07/21/2021: Hemoglobin 14.6; Magnesium 2.1; Platelets 258 08/23/2021: BUN 17; Creatinine, Ser 0.84; Potassium 3.9; Sodium 141  Recent Lipid Panel    Component Value Date/Time   CHOL 209 (H) 07/10/2020 0845   TRIG 107 07/10/2020 0845   HDL 57 07/10/2020 0845   CHOLHDL 3.7 07/10/2020 0845   LDLCALC 133 (H) 07/10/2020 0845    PHYSICAL EXAM:    VS:  BP 118/78 (BP Location: Left Arm, Patient Position: Sitting, Cuff  Size: Normal)   Pulse (!) 121   Ht 5\' 10"  (1.778 m)   Wt 201 lb 4 oz (91.3 kg)   SpO2 96%   BMI 28.88 kg/m   BMI: Body mass index is 28.88 kg/m.  Physical Exam Vitals reviewed.  Constitutional:      Appearance: He is well-developed.  HENT:     Head: Normocephalic and atraumatic.  Eyes:     General:        Right eye: No discharge.        Left eye: No discharge.  Neck:     Vascular: No JVD.  Cardiovascular:     Rate and Rhythm: Normal rate. Rhythm irregularly irregular.     Pulses:          Posterior tibial pulses are 2+ on the right side and 2+ on the left side.     Heart sounds: Normal heart sounds, S1 normal and S2 normal. Heart sounds not distant. No midsystolic click and no opening snap. No murmur heard.    No friction rub.     Comments: Heart rate improved to the 90s bpm on auscultation Pulmonary:     Effort: Pulmonary effort is normal. No respiratory distress.     Breath sounds: Normal breath sounds. No decreased breath sounds, wheezing or rales.  Chest:     Chest wall: No tenderness.  Abdominal:     General: There is no distension.  Musculoskeletal:     Cervical back: Normal range of motion.     Right lower leg: No edema.     Left lower leg: No edema.  Skin:    General: Skin is warm and dry.     Nails: There is no clubbing.  Neurological:     Mental Status: He is alert and oriented to person, place, and time.  Psychiatric:        Speech: Speech normal.        Behavior: Behavior normal.        Thought Content: Thought content normal.        Judgment:  Judgment normal.     Wt Readings from Last 3 Encounters:  10/08/21 201 lb 4 oz (91.3 kg)  08/23/21 210 lb 2 oz (95.3 kg)  07/26/21 207 lb (93.9 kg)     ASSESSMENT & PLAN:   HFrEF secondary to NICM: He appears euvolemic and well compensated.  Most recent echo from 2 months ago demonstrated an improved LV systolic function with an EF of 45%.  He remains on Toprol-XL and [place].  If renal function allows,  would consider addition of ARB or ARNI to further optimize GDMT followed by potential addition of MRA.  Escalation of GDMT has been limited at times due to adherence issues and follow-up.  We will transition his torsemide and KCl to as needed dosing given euvolemia and with gout flareups.  He does remain on digoxin, as this was started at his visit in 01/2021.  We will obtain a BMP and digoxin level today.  He does remain on diltiazem, despite his cardiomyopathy, given that this has helped with his ventricular rates the most with regards to his A-fib.  CHF education.  Permanent A-fib: Ventricular rate is well controlled at rest.  Rate control strategy has been pursued secondary to medication adherence concerns.  He remains on Toprol-XL and diltiazem, along with digoxin which was initiated in 01/2021.  We will obtain a digoxin level, BMP, magnesium, and CBC.  CHA2DS2-VASc at least 2 (CHF, vascular disease).  He remains on Xarelto and does not meet reduced dosing criteria.  No symptoms concerning for significant bleeding.  Polysubstance abuse: Urine drug screen positive for cocaine in 07/2021.  He indicates he has not used since.  No longer drinking alcohol.  History of recurrent PE: Recurrence of PE likely in the context of medication nonadherence.  He remains on Xarelto as outlined above.  Gout: Encourage patient to follow-up with PCP.  Would minimize colchicine usage.  Query if he would benefit from suppressive therapy.  He will follow-up with his PCP to discuss this further.   Disposition: F/u with Dr. Mariah Milling or an APP in 6 months.   Medication Adjustments/Labs and Tests Ordered: Current medicines are reviewed at length with the patient today.  Concerns regarding medicines are outlined above. Medication changes, Labs and Tests ordered today are summarized above and listed in the Patient Instructions accessible in Encounters.   Signed, Eula Listen, PA-C 10/08/2021 12:06 PM     [facility] -  [employer] [address] [place], [place] [address] [phone]

## 2021-10-08 ENCOUNTER — Telehealth: Payer: Self-pay | Admitting: *Deleted

## 2021-10-08 ENCOUNTER — Ambulatory Visit (INDEPENDENT_AMBULATORY_CARE_PROVIDER_SITE_OTHER): Payer: 59 | Admitting: Physician Assistant

## 2021-10-08 ENCOUNTER — Encounter: Payer: Self-pay | Admitting: Physician Assistant

## 2021-10-08 ENCOUNTER — Other Ambulatory Visit
Admission: RE | Admit: 2021-10-08 | Discharge: 2021-10-08 | Disposition: A | Discharge status: Home / Self Care | Payer: 59 | Attending: Physician Assistant | Admitting: Physician Assistant

## 2021-10-08 VITALS — BP 118/78 | HR 121 | Ht 70.0 in | Wt 201.2 lb

## 2021-10-08 DIAGNOSIS — F1910 Other psychoactive substance abuse, uncomplicated: Secondary | ICD-10-CM | POA: Diagnosis not present

## 2021-10-08 DIAGNOSIS — Z86711 Personal history of pulmonary embolism: Secondary | ICD-10-CM

## 2021-10-08 DIAGNOSIS — I428 Other cardiomyopathies: Secondary | ICD-10-CM | POA: Insufficient documentation

## 2021-10-08 DIAGNOSIS — I5020 Unspecified systolic (congestive) heart failure: Secondary | ICD-10-CM | POA: Diagnosis not present

## 2021-10-08 DIAGNOSIS — R69 Illness, unspecified: Secondary | ICD-10-CM | POA: Diagnosis not present

## 2021-10-08 DIAGNOSIS — M109 Gout, unspecified: Secondary | ICD-10-CM | POA: Insufficient documentation

## 2021-10-08 DIAGNOSIS — I4821 Permanent atrial fibrillation: Secondary | ICD-10-CM | POA: Diagnosis not present

## 2021-10-08 LAB — DIGOXIN LEVEL: Digoxin Level: 0.8 ng/mL (ref 0.8–2.0)

## 2021-10-08 LAB — BASIC METABOLIC PANEL
Anion gap: 10 (ref 5–15)
BUN: 12 mg/dL (ref 6–20)
CO2: 25 mmol/L (ref 22–32)
Calcium: 9.5 mg/dL (ref 8.9–10.3)
Chloride: 105 mmol/L (ref 98–111)
Creatinine, Ser: 1.16 mg/dL (ref 0.61–1.24)
GFR, Estimated: >60 mL/min (ref >60)
Glucose, Bld: 99 mg/dL (ref 70–99)
Potassium: 4.8 mmol/L (ref 3.5–5.1)
Sodium: 140 mmol/L (ref 135–145)

## 2021-10-08 LAB — MAGNESIUM: Magnesium: 2.4 mg/dL (ref 1.7–2.4)

## 2021-10-08 MED ORDER — POTASSIUM CHLORIDE CRYS ER 20 MEQ PO TBCR: 10.0000 meq | EXTENDED_RELEASE_TABLET | Freq: Every day | ORAL | 3 refills | Status: DC | PRN

## 2021-10-08 MED ORDER — DIGOXIN 250 MCG PO TABS: ORAL_TABLET | ORAL | 3 refills | Status: DC

## 2021-10-08 MED ORDER — XARELTO 20 MG PO TABS: 20.0000 mg | ORAL_TABLET | Freq: Every day | ORAL | 3 refills | Status: DC

## 2021-10-08 MED ORDER — DILTIAZEM HCL ER COATED BEADS 180 MG PO CP24: 180.0000 mg | ORAL_CAPSULE | Freq: Every day | ORAL | 3 refills | Status: DC

## 2021-10-08 MED ORDER — TORSEMIDE 20 MG PO TABS: 20.0000 mg | ORAL_TABLET | Freq: Once | ORAL | 5 refills | Status: DC | PRN

## 2021-10-08 NOTE — Telephone Encounter (Signed)
Contacted lab and they are not able to add on the CBC. Will reach out to patient with results and request to go at his convenience to have the additional test.

## 2021-10-08 NOTE — Telephone Encounter (Signed)
Spoke with patient and reviewed results. Reviewed that lab did not draw one of the labs. Requested that he go at his earliest convenience to have CBC drawn. He states tomorrow he will be seeing Darylene Price NP upstairs and can get it done then. I will send her request that we need him to go have that drawn after his visit with her. He verbalized understanding with no further questions at this time.

## 2021-10-08 NOTE — Telephone Encounter (Signed)
Left voicemail message to review results and discuss additional lab

## 2021-10-08 NOTE — Telephone Encounter (Signed)
-----   Message from Rise Mu, [place] sent at 10/08/2021  1:08 PM EDT ----- Please inform the patient his labs are normal.  Please check on CBC as it does not look like this was drawn.

## 2021-10-08 NOTE — Progress Notes (Unsigned)
Patient ID: Calvin Esparza, male    DOB: 1960-04-17, 61 y.o.   MRN: [id number]  HPI  Calvin Esparza is a 61 y/o male with a history of atrial fibrillation, PE, alcohol use, tobacco use (chewing tobacco) and chronic heart failure.   Echo report from 07/18/21 reviewed and showed an EF of 40-45% along with mild LVH, mild Calvin and moderate LAE. Echo report from 06/08/19 reviewed and showed an EF of 35-40% along with mild LVH and mild LAE.   Admitted 07/17/21 due to SOB due to HF exacerbation. Initially given IV lasix with transition to oral diuretics. Elevated troponin thought to be due to demand ischemia. Discharged after 4 days.   He presents today for a follow-up visit with a chief complaint of minimal fatigue upon moderate exertion. Describes this as chronic in nature. He has associated anxiety, right wrist numbness and gout pain along with this. He denies any difficulty sleeping, dizziness, abdominal distention, palpitations, pedal edema, chest pain, shortness of breath, cough or weight gain.   His biggest complaint today is of his gout pain. Says that he's experiencing it in his elbows, knees and feet.   Hasn't taken his diuretic in > 10 days.   Past Medical History:  Diagnosis Date   Alcohol abuse    Chronic pain    COVID-19    a. 02/2019   HFrEF (heart failure with reduced ejection fraction) ([place])    a. 05/2019 Echo: EF 35-40%.   History of medication noncompliance    Longstanding persistent atrial fibrillation ([place])    a. Dx 03/2018; b. 05/2019 recurrent Afib in setting of PE. CHA2DS2VASc = 1-->Eliquis later changed to xarelto; b. 07/2019 s/p DCCV (150J); d. 08/2019 admit w/ AF RVR in settting of noncompliance - longstanding persistent afib since on bb/ccb rx.   NICM (nonischemic cardiomyopathy) ([place])    a. 05/2019 Echo: EF 35-40%, gr1 DD. Mod enlarged RV. Mildly dil LA. Mild to mod dil RA. Triv AI; b. 05/2019 MV: EF 32%, no ischemia.   Pulmonary embolism ([place])    a. 05/2019 CTA Chest: mild amt of  PE w/in a lower lobe branch of RPA; b. 08/2019 CTA Chest: Small PE in RML and RLL PA.   Past Surgical History:  Procedure Laterality Date   CARDIOVERSION N/A 07/29/2019   Procedure: CARDIOVERSION;  Surgeon: Minna Merritts, MD;  Location: [facility] ORS;  Service: Cardiovascular;  Laterality: N/A;   COLONOSCOPY WITH PROPOFOL N/A 05/24/2020   Procedure: COLONOSCOPY WITH PROPOFOL;  Surgeon: Lin Landsman, MD;  Location: [facility] ENDOSCOPY;  Service: Gastroenterology;  Laterality: N/A;   ESOPHAGOGASTRODUODENOSCOPY (EGD) WITH PROPOFOL N/A 02/27/2020   Procedure: ESOPHAGOGASTRODUODENOSCOPY (EGD) WITH PROPOFOL;  Surgeon: Lin Landsman, MD;  Location: [facility] ENDOSCOPY;  Service: Gastroenterology;  Laterality: N/A;   ESOPHAGOGASTRODUODENOSCOPY (EGD) WITH PROPOFOL N/A 05/24/2020   Procedure: ESOPHAGOGASTRODUODENOSCOPY (EGD) WITH PROPOFOL;  Surgeon: Lin Landsman, MD;  Location: [facility] ENDOSCOPY;  Service: Gastroenterology;  Laterality: N/A;   Family History  Problem Relation Age of Onset   Bone cancer Father    Stroke Father    Diabetes Sister    Social History   Tobacco Use   Smoking status: Never   Smokeless tobacco: Current    Types: Chew  Substance Use Topics   Alcohol use: Yes    Alcohol/week: 24.0 standard drinks of alcohol    Types: 24 Cans of beer per week    Comment: 6 beers daily, weekend drinks more-   No Known Allergies  Prior  to Admission medications   Medication Sig Start Date End Date Taking? Authorizing Provider  Aspirin-Caffeine (BC FAST PAIN RELIEF PO) Take 1 packet by mouth daily as needed.   Yes [provider]  colchicine 0.6 MG tablet Take 1 tablet (0.6 mg total) by mouth 2 (two) times daily. 05/01/21 05/01/22 Yes Letitia Neri L, PA-C  dapagliflozin propanediol (FARXIGA) 10 MG TABS tablet Take 1 tablet (10 mg total) by mouth daily before breakfast. 07/26/21  Yes Alisa Graff, FNP  digoxin (LANOXIN) 0.25 MG tablet Take 0.5 tablet (0.125 mg) by mouth  once daily 10/08/21  Yes Dunn, Areta Haber, PA-C  diltiazem (CARDIZEM CD) 180 MG 24 hr capsule Take 1 capsule (180 mg total) by mouth daily. 10/08/21  Yes Dunn, Areta Haber, PA-C  metoprolol (TOPROL-XL) 200 MG 24 hr tablet Take 1 tablet (200 mg total) by mouth daily. 12/03/20  Yes Furth, Cadence H, PA-C  potassium chloride SA (KLOR-CON M20) 20 MEQ tablet Take 0.5 tablets (10 mEq total) by mouth daily as needed (As needed when you take torsemide.). 10/08/21  Yes Dunn, Areta Haber, PA-C  torsemide (DEMADEX) 20 MG tablet Take 1 tablet (20 mg total) by mouth once as needed for up to 1 dose (As needed for swelling or weight gain). 10/08/21  Yes Dunn, Ryan M, PA-C  XARELTO 20 MG TABS tablet Take 1 tablet (20 mg total) by mouth daily with supper. 10/08/21  Yes Dunn, Areta Haber, PA-C    Review of Systems  Constitutional:  Positive for fatigue (minimal). Negative for appetite change.  HENT:  Negative for congestion, postnasal drip and sore throat.   Eyes: Negative.   Respiratory:  Negative for cough, chest tightness and shortness of breath.   Cardiovascular:  Negative for chest pain, palpitations and leg swelling.  Gastrointestinal:  Negative for abdominal distention and abdominal pain.  Endocrine: Negative.   Genitourinary: Negative.   Musculoskeletal:  Positive for arthralgias ("joints all over") and back pain. Negative for neck pain.  Skin: Negative.   Allergic/Immunologic: Negative.   Neurological:  Negative for dizziness and light-headedness.  Hematological:  Negative for adenopathy. Does not bruise/bleed easily.  Psychiatric/Behavioral:  Negative for dysphoric mood and sleep disturbance (sleeping on 1-2 pillows). The patient is nervous/anxious.    Vitals:   10/09/21 0855  BP: 134/89  Pulse: 81  Resp: 16  SpO2: 100%  Weight: 201 lb 6 oz (91.3 kg)  Height: '5\' 10"'$  (1.778 m)   Wt Readings from Last 3 Encounters:  10/09/21 201 lb 6 oz (91.3 kg)  10/08/21 201 lb 4 oz (91.3 kg)  08/23/21 210 lb 2 oz (95.3 kg)    Lab Results  Component Value Date   CREATININE 1.16 10/08/2021   CREATININE 0.84 08/23/2021   CREATININE 1.09 07/21/2021    Physical Exam Vitals and nursing note reviewed.  Constitutional:      Appearance: He is well-developed.  HENT:     Head: Normocephalic and atraumatic.  Neck:     Vascular: No JVD.  Cardiovascular:     Rate and Rhythm: Normal rate. Rhythm irregular.  Pulmonary:     Effort: Pulmonary effort is normal. No respiratory distress.     Breath sounds: No wheezing or rales.  Abdominal:     General: There is no distension.     Palpations: Abdomen is soft.     Tenderness: There is no abdominal tenderness.  Musculoskeletal:     Cervical back: Normal range of motion and neck supple.  Right lower leg: No tenderness. No edema.     Left lower leg: No tenderness. No edema.  Skin:    General: Skin is warm and dry.  Neurological:     General: No focal deficit present.     Mental Status: He is alert and oriented to person, place, and time.  Psychiatric:        Mood and Affect: Mood is anxious.        Behavior: Behavior normal.    Assessment & Plan:  1: Chronic heart failure with reduced ejection fraction- - NYHA class II - euvolemic today - weighing daily; reminded to call for an overnight weight gain of >2 pounds or a weekly weight gain of >5 pounds - weight down 9 pounds from last visit here 6 weeks ago - not adding salt to his food and tries to be aware of his sodium intake - discussed the importance of keeping his daily fluid intake to ~ 64 ounces - on GDMT of metoprolol & farxiga - will start entresto 24/'26mg'$  BID; 30 day voucher provided today - will recheck BMP next visit - can take his diuretic/potassium PRN for above weight gain or any swelling - BNP 07/17/21 was 545.6 - PharmD reconciled medications with patient  2: Atrial fibrillation/ PE- - saw cardiology (Dunn) 10/08/21 - currently on digoxin, diltiazem, xarelto and metoprolol - dig level  10/08/21 was 0.8  3: HTN- - BP mildly elevated (134/89); beginning entresto per above - saw PCP Calvin Esparza) 09/26/19 - BMP 10/08/21 reviewed and showed sodium 140, potassium 4.8, creatinine 1.16 and GFR >60  4: Alcohol use- - + chews tobacco but "very little" and went 3 weeks without chewing any - drinks 2-3 beers on weekends  - congratulated on the reduction and encouraged to continue reducing his alcohol use  5: Gout- - will check uric acid today - instructed to call PCP and get appointment scheduled as he most likely needs daily allopurinol - can also f/u with orthopedist regarding wrist numbness    Medication list reviewed.   Return in 1 month, sooner if needed.

## 2021-10-08 NOTE — Patient Instructions (Signed)
Medication Instructions:  Your physician has recommended you make the following change in your medication:   TAKE Torsemide once daily as needed for weight gain or swelling.  TAKE Potassium once daily as needed when you take Torsemide.   *If you need a refill on your cardiac medications before your next appointment, please call your pharmacy*   Lab Work: BMET, Mag , CBC, & Digoxin level today  If you have labs (blood work) drawn today and your tests are completely normal, you will receive your results only by: MyChart Message (if you have MyChart) OR A paper copy in the mail If you have any lab test that is abnormal or we need to change your treatment, we will call you to review the results.   Testing/Procedures: None   Follow-Up: At [facility], you and your health needs are our priority.  As part of our continuing mission to provide you with exceptional heart care, we have created designated Provider Care Teams.  These Care Teams include your primary Cardiologist (physician) and Advanced Practice Providers (APPs -  Physician Assistants and Nurse Practitioners) who all work together to provide you with the care you need, when you need it.  We recommend signing up for the patient portal called "MyChart".  Sign up information is provided on this After Visit Summary.  MyChart is used to connect with patients for Virtual Visits (Telemedicine).  Patients are able to view lab/test results, encounter notes, upcoming appointments, etc.  Non-urgent messages can be sent to your provider as well.   To learn more about what you can do with MyChart, go to [email or web address].    Your next appointment:   6 month(s)  The format for your next appointment:   In Person  Provider:   Ida Rogue, MD or Christell Faith, PA-C      Important Information About Sugar

## 2021-10-09 ENCOUNTER — Other Ambulatory Visit
Admission: RE | Admit: 2021-10-09 | Discharge: 2021-10-09 | Disposition: A | Discharge status: Home / Self Care | Payer: 59 | Source: Ambulatory Visit | Attending: Family | Admitting: Family

## 2021-10-09 ENCOUNTER — Ambulatory Visit (HOSPITAL_BASED_OUTPATIENT_CLINIC_OR_DEPARTMENT_OTHER): Payer: 59 | Admitting: Family

## 2021-10-09 ENCOUNTER — Other Ambulatory Visit: Payer: Self-pay | Admitting: Family

## 2021-10-09 ENCOUNTER — Other Ambulatory Visit
Admission: RE | Admit: 2021-10-09 | Discharge: 2021-10-09 | Disposition: A | Discharge status: Home / Self Care | Payer: 59 | Attending: Physician Assistant | Admitting: Physician Assistant

## 2021-10-09 ENCOUNTER — Encounter: Payer: Self-pay | Admitting: Pharmacist

## 2021-10-09 ENCOUNTER — Encounter: Payer: Self-pay | Admitting: Family

## 2021-10-09 VITALS — BP 134/89 | HR 81 | Resp 16 | Ht 70.0 in | Wt 201.4 lb

## 2021-10-09 DIAGNOSIS — R0602 Shortness of breath: Secondary | ICD-10-CM | POA: Insufficient documentation

## 2021-10-09 DIAGNOSIS — M109 Gout, unspecified: Secondary | ICD-10-CM

## 2021-10-09 DIAGNOSIS — I5020 Unspecified systolic (congestive) heart failure: Secondary | ICD-10-CM | POA: Diagnosis not present

## 2021-10-09 DIAGNOSIS — I10 Essential (primary) hypertension: Secondary | ICD-10-CM | POA: Diagnosis not present

## 2021-10-09 DIAGNOSIS — F1910 Other psychoactive substance abuse, uncomplicated: Secondary | ICD-10-CM | POA: Insufficient documentation

## 2021-10-09 DIAGNOSIS — I4891 Unspecified atrial fibrillation: Secondary | ICD-10-CM | POA: Insufficient documentation

## 2021-10-09 DIAGNOSIS — Z79899 Other long term (current) drug therapy: Secondary | ICD-10-CM | POA: Insufficient documentation

## 2021-10-09 DIAGNOSIS — I4819 Other persistent atrial fibrillation: Secondary | ICD-10-CM

## 2021-10-09 DIAGNOSIS — F419 Anxiety disorder, unspecified: Secondary | ICD-10-CM | POA: Insufficient documentation

## 2021-10-09 DIAGNOSIS — Z7901 Long term (current) use of anticoagulants: Secondary | ICD-10-CM | POA: Diagnosis not present

## 2021-10-09 DIAGNOSIS — I4821 Permanent atrial fibrillation: Secondary | ICD-10-CM | POA: Diagnosis not present

## 2021-10-09 DIAGNOSIS — I110 Hypertensive heart disease with heart failure: Secondary | ICD-10-CM | POA: Diagnosis not present

## 2021-10-09 DIAGNOSIS — Z86711 Personal history of pulmonary embolism: Secondary | ICD-10-CM | POA: Diagnosis not present

## 2021-10-09 DIAGNOSIS — I428 Other cardiomyopathies: Secondary | ICD-10-CM | POA: Diagnosis not present

## 2021-10-09 DIAGNOSIS — I5022 Chronic systolic (congestive) heart failure: Secondary | ICD-10-CM | POA: Insufficient documentation

## 2021-10-09 DIAGNOSIS — R69 Illness, unspecified: Secondary | ICD-10-CM | POA: Diagnosis not present

## 2021-10-09 DIAGNOSIS — F17220 Nicotine dependence, chewing tobacco, uncomplicated: Secondary | ICD-10-CM | POA: Insufficient documentation

## 2021-10-09 DIAGNOSIS — F1010 Alcohol abuse, uncomplicated: Secondary | ICD-10-CM

## 2021-10-09 LAB — URIC ACID: Uric Acid, Serum: 6.6 mg/dL (ref 3.7–8.6)

## 2021-10-09 LAB — CBC
HCT: 44.6 % (ref 39.0–52.0)
Hemoglobin: 14.3 g/dL (ref 13.0–17.0)
MCH: 29.4 pg (ref 26.0–34.0)
MCHC: 32.1 g/dL (ref 30.0–36.0)
MCV: 91.8 fL (ref 80.0–100.0)
Platelets: 221 10*3/uL (ref 150–400)
RBC: 4.86 MIL/uL (ref 4.22–5.81)
RDW: 13.7 % (ref 11.5–15.5)
WBC: 5.8 10*3/uL (ref 4.0–10.5)
nRBC: 0 % (ref 0.0–0.2)

## 2021-10-09 LAB — DIGOXIN LEVEL: Digoxin Level: 1.1 ng/mL (ref 0.8–2.0)

## 2021-10-09 MED ORDER — COLCHICINE 0.6 MG PO TABS: 0.6000 mg | ORAL_TABLET | Freq: Two times a day (BID) | ORAL | 0 refills | Status: DC

## 2021-10-09 MED ORDER — SACUBITRIL-VALSARTAN 24-26 MG PO TABS
1.0000 | ORAL_TABLET | Freq: Two times a day (BID) | ORAL | 3 refills | Status: DC
Start: 2021-10-09 — End: 2021-11-28

## 2021-10-09 NOTE — Progress Notes (Signed)
[place] - PHARMACIST COUNSELING NOTE  *HFrEF*  Guideline-Directed Medical Therapy/Evidence Based Medicine  ACE/ARB/ARNI:  none Beta Blocker: Metoprolol succinate 200 mg daily Aldosterone Antagonist:  none Diuretic: Torsemide 20 mg daily PRN SGLT2i: Dapagliflozin 10 mg daily  Adherence Assessment  Do you ever forget to take your medication? '[]'$ Yes '[x]'$ No  Do you ever skip doses due to side effects? '[]'$ Yes '[x]'$ No  Do you have trouble affording your medicines? '[]'$ Yes '[x]'$ No  Are you ever unable to pick up your medication due to transportation difficulties? '[]'$ Yes '[x]'$ No  Do you ever stop taking your medications because you don't believe they are helping? '[]'$ Yes '[x]'$ No  Do you check your weight daily? '[]'$ Yes '[x]'$ No   Adherence strategy: none  Barriers to obtaining medications: none  Vital signs: HR 81, BP 134/89, weight (pounds) 201 lb ECHO: Date 5/4/20223, EF 40-45%, notes  mild LVH, mild MR and moderate LAE  Date 06/08/2019, EF 35-40%     Latest Ref Rng & Units 10/08/2021   12:29 PM 08/23/2021    9:40 AM 07/21/2021    4:32 AM  BMP  Glucose 70 - 99 mg/dL 99  106  124   BUN 6 - 20 mg/dL '12  17  23   '$ Creatinine 0.61 - 1.24 mg/dL 1.16  0.84  1.09   Sodium 135 - 145 mmol/L 140  141  138   Potassium 3.5 - 5.1 mmol/L 4.8  3.9  3.8   Chloride 98 - 111 mmol/L 105  107  95   CO2 22 - 32 mmol/L '25  28  30   '$ Calcium 8.9 - 10.3 mg/dL 9.5  9.7  9.2     Past Medical History:  Diagnosis Date   Alcohol abuse    Chronic pain    COVID-19    a. 02/2019   HFrEF (heart failure with reduced ejection fraction) ([place])    a. 05/2019 Echo: EF 35-40%.   History of medication noncompliance    Longstanding persistent atrial fibrillation ([place])    a. Dx 03/2018; b. 05/2019 recurrent Afib in setting of PE. CHA2DS2VASc = 1-->Eliquis later changed to xarelto; b. 07/2019 s/p DCCV (150J); d. 08/2019 admit w/ AF RVR in settting of noncompliance - longstanding persistent  afib since on bb/ccb rx.   NICM (nonischemic cardiomyopathy) ([place])    a. 05/2019 Echo: EF 35-40%, gr1 DD. Mod enlarged RV. Mildly dil LA. Mild to mod dil RA. Triv AI; b. 05/2019 MV: EF 32%, no ischemia.   Pulmonary embolism ([place])    a. 05/2019 CTA Chest: mild amt of PE w/in a lower lobe branch of RPA; b. 08/2019 CTA Chest: Small PE in RML and RLL PA.    ASSESSMENT 61 year old male who presents to the HF clinic follow up. PMH includes  atrial fibrillation, PE, alcohol use, tobacco use (chewing tobacco) and chronic heart failure. Last hospital admission was on 07/21/2021 d/t HF exacerbation.  Patient reports feeling better, good breathing and compliance with all mediation. He still drinking and smoking but significantly decreased. Main complaint is gout flair up and swollen joints.  PLAN Noted patient is taking colchicine 1.'2mg'$  every morning, and BC powder. Instructed to change colchicine dose to 1.'2mg'$  x 1 tomorrow, 0.39m tomorrow ar noon, then decrease to 0.'6mg'$  BID until wee completed. After 1 week, patient should take medication only PRN (noted ddi with diltiazem). Use tylenol instead of ASA or BC powder to decrease risk of GI bleed while on Xarelto. Contact orthopedic  doctor for join pain management Start Entresto 24-'26mg'$  twice daily   Time spent: 20 minutes  Zalayah Pizzuto Rodriguez-Guzman PharmD, BCPS 10/09/2021 9:35 AM  Current Outpatient Medications:    Aspirin-Caffeine (BC FAST PAIN RELIEF PO), Take 1 packet by mouth daily as needed., Disp: , Rfl:    colchicine 0.6 MG tablet, Take 1 tablet (0.6 mg total) by mouth 2 (two) times daily., Disp: 30 tablet, Rfl: 0   dapagliflozin propanediol (FARXIGA) 10 MG TABS tablet, Take 1 tablet (10 mg total) by mouth daily before breakfast., Disp: 30 tablet, Rfl: 5   digoxin (LANOXIN) 0.25 MG tablet, Take 0.5 tablet (0.125 mg) by mouth once daily, Disp: 45 tablet, Rfl: 3   diltiazem (CARDIZEM CD) 180 MG 24 hr capsule, Take 1 capsule (180 mg total) by mouth  daily., Disp: 90 capsule, Rfl: 3   metoprolol (TOPROL-XL) 200 MG 24 hr tablet, Take 1 tablet (200 mg total) by mouth daily., Disp: 90 tablet, Rfl: 1   potassium chloride SA (KLOR-CON M20) 20 MEQ tablet, Take 0.5 tablets (10 mEq total) by mouth daily as needed (As needed when you take torsemide.)., Disp: 60 tablet, Rfl: 3   sacubitril-valsartan (ENTRESTO) 24-26 MG, Take 1 tablet by mouth 2 (two) times daily., Disp: 60 tablet, Rfl: 3   torsemide (DEMADEX) 20 MG tablet, Take 1 tablet (20 mg total) by mouth once as needed for up to 1 dose (As needed for swelling or weight gain)., Disp: 60 tablet, Rfl: 5   XARELTO 20 MG TABS tablet, Take 1 tablet (20 mg total) by mouth daily with supper., Disp: 90 tablet, Rfl: 3

## 2021-10-09 NOTE — Patient Instructions (Addendum)
Continue weighing daily and call for an overnight weight gain of 3 pounds or more or a weekly weight gain of more than 5 pounds.   If you have voicemail, please make sure your mailbox is cleaned out so that we may leave a message and please make sure to listen to any voicemails.    Start entresto as 1 tablet in the morning and 1 tablet in the evening.    Go to the [facility] and get a CBC and uric acid level checked today.

## 2021-10-29 ENCOUNTER — Other Ambulatory Visit: Payer: Self-pay | Admitting: Cardiovascular Disease

## 2021-11-07 ENCOUNTER — Ambulatory Visit: Payer: 59 | Admitting: Family

## 2021-11-28 ENCOUNTER — Other Ambulatory Visit
Admission: RE | Admit: 2021-11-28 | Discharge: 2021-11-28 | Disposition: A | Discharge status: Home / Self Care | Payer: 59 | Source: Ambulatory Visit | Attending: Family | Admitting: Family

## 2021-11-28 ENCOUNTER — Ambulatory Visit (HOSPITAL_BASED_OUTPATIENT_CLINIC_OR_DEPARTMENT_OTHER): Payer: 59 | Admitting: Family

## 2021-11-28 ENCOUNTER — Encounter: Payer: Self-pay | Admitting: Family

## 2021-11-28 VITALS — BP 97/68 | HR 100 | Resp 16 | Ht 69.0 in | Wt 198.4 lb

## 2021-11-28 DIAGNOSIS — M109 Gout, unspecified: Secondary | ICD-10-CM | POA: Diagnosis not present

## 2021-11-28 DIAGNOSIS — R42 Dizziness and giddiness: Secondary | ICD-10-CM | POA: Insufficient documentation

## 2021-11-28 DIAGNOSIS — I5020 Unspecified systolic (congestive) heart failure: Secondary | ICD-10-CM

## 2021-11-28 DIAGNOSIS — I4891 Unspecified atrial fibrillation: Secondary | ICD-10-CM | POA: Insufficient documentation

## 2021-11-28 DIAGNOSIS — I10 Essential (primary) hypertension: Secondary | ICD-10-CM

## 2021-11-28 DIAGNOSIS — R69 Illness, unspecified: Secondary | ICD-10-CM | POA: Diagnosis not present

## 2021-11-28 DIAGNOSIS — Z86711 Personal history of pulmonary embolism: Secondary | ICD-10-CM | POA: Insufficient documentation

## 2021-11-28 DIAGNOSIS — H538 Other visual disturbances: Secondary | ICD-10-CM | POA: Insufficient documentation

## 2021-11-28 DIAGNOSIS — F1010 Alcohol abuse, uncomplicated: Secondary | ICD-10-CM | POA: Diagnosis not present

## 2021-11-28 DIAGNOSIS — I110 Hypertensive heart disease with heart failure: Secondary | ICD-10-CM | POA: Diagnosis not present

## 2021-11-28 DIAGNOSIS — F17220 Nicotine dependence, chewing tobacco, uncomplicated: Secondary | ICD-10-CM | POA: Insufficient documentation

## 2021-11-28 DIAGNOSIS — I4819 Other persistent atrial fibrillation: Secondary | ICD-10-CM | POA: Diagnosis not present

## 2021-11-28 DIAGNOSIS — I5022 Chronic systolic (congestive) heart failure: Secondary | ICD-10-CM | POA: Insufficient documentation

## 2021-11-28 DIAGNOSIS — Z79899 Other long term (current) drug therapy: Secondary | ICD-10-CM | POA: Insufficient documentation

## 2021-11-28 DIAGNOSIS — Z7901 Long term (current) use of anticoagulants: Secondary | ICD-10-CM | POA: Insufficient documentation

## 2021-11-28 DIAGNOSIS — Z7984 Long term (current) use of oral hypoglycemic drugs: Secondary | ICD-10-CM | POA: Insufficient documentation

## 2021-11-28 DIAGNOSIS — R5383 Other fatigue: Secondary | ICD-10-CM | POA: Insufficient documentation

## 2021-11-28 LAB — BASIC METABOLIC PANEL
Anion gap: 9 (ref 5–15)
BUN: 29 mg/dL — ABNORMAL HIGH (ref 6–20)
CO2: 26 mmol/L (ref 22–32)
Calcium: 9.4 mg/dL (ref 8.9–10.3)
Chloride: 103 mmol/L (ref 98–111)
Creatinine, Ser: 1.31 mg/dL — ABNORMAL HIGH (ref 0.61–1.24)
GFR, Estimated: >60 mL/min (ref >60)
Glucose, Bld: 80 mg/dL (ref 70–99)
Potassium: 3.9 mmol/L (ref 3.5–5.1)
Sodium: 138 mmol/L (ref 135–145)

## 2021-11-28 NOTE — Progress Notes (Signed)
Patient ID: Calvin Esparza, male    DOB: 03-01-1961, 61 y.o.   MRN: [id number]  HPI  Calvin Esparza is a 61 y/o male with a history of atrial fibrillation, PE, alcohol use, tobacco use (chewing tobacco) and chronic heart failure.   Echo report from 07/18/21 reviewed and showed an EF of 40-45% along with mild LVH, mild Calvin and moderate LAE. Echo report from 06/08/19 reviewed and showed an EF of 35-40% along with mild LVH and mild LAE.   Admitted 07/17/21 due to SOB due to HF exacerbation. Initially given IV lasix with transition to oral diuretics. Elevated troponin thought to be due to demand ischemia. Discharged after 4 days.   He presents today for a follow-up visit with a chief complaint of minimal fatigue upon moderate exertion. Describes this as chronic in nature.He says ever since starting entresto he now becomes dizzy and has blurred vision. He has associated anxiety, right wrist numbness and gout pain along with this. Having difficulty sleeping as well. Denies SOB, cough, chest pain/pressure, palpitations, abdominal distention, nor swelling in lower extremities. Monitoring weight at home every other day. Trying his best to follow a low-sodium diet.   Past Medical History:  Diagnosis Date   Alcohol abuse    Chronic pain    COVID-19    a. 02/2019   HFrEF (heart failure with reduced ejection fraction) ([place])    a. 05/2019 Echo: EF 35-40%.   History of medication noncompliance    Longstanding persistent atrial fibrillation ([place])    a. Dx 03/2018; b. 05/2019 recurrent Afib in setting of PE. CHA2DS2VASc = 1-->Eliquis later changed to xarelto; b. 07/2019 s/p DCCV (150J); d. 08/2019 admit w/ AF RVR in settting of noncompliance - longstanding persistent afib since on bb/ccb rx.   NICM (nonischemic cardiomyopathy) ([place])    a. 05/2019 Echo: EF 35-40%, gr1 DD. Mod enlarged RV. Mildly dil LA. Mild to mod dil RA. Triv AI; b. 05/2019 MV: EF 32%, no ischemia.   Pulmonary embolism ([place])    a. 05/2019 CTA Chest: mild  amt of PE w/in a lower lobe branch of RPA; b. 08/2019 CTA Chest: Small PE in RML and RLL PA.   Past Surgical History:  Procedure Laterality Date   CARDIOVERSION N/A 07/29/2019   Procedure: CARDIOVERSION;  Surgeon: Minna Merritts, MD;  Location: [facility] ORS;  Service: Cardiovascular;  Laterality: N/A;   COLONOSCOPY WITH PROPOFOL N/A 05/24/2020   Procedure: COLONOSCOPY WITH PROPOFOL;  Surgeon: Lin Landsman, MD;  Location: [facility] ENDOSCOPY;  Service: Gastroenterology;  Laterality: N/A;   ESOPHAGOGASTRODUODENOSCOPY (EGD) WITH PROPOFOL N/A 02/27/2020   Procedure: ESOPHAGOGASTRODUODENOSCOPY (EGD) WITH PROPOFOL;  Surgeon: Lin Landsman, MD;  Location: [facility] ENDOSCOPY;  Service: Gastroenterology;  Laterality: N/A;   ESOPHAGOGASTRODUODENOSCOPY (EGD) WITH PROPOFOL N/A 05/24/2020   Procedure: ESOPHAGOGASTRODUODENOSCOPY (EGD) WITH PROPOFOL;  Surgeon: Lin Landsman, MD;  Location: [facility] ENDOSCOPY;  Service: Gastroenterology;  Laterality: N/A;   Family History  Problem Relation Age of Onset   Bone cancer Father    Stroke Father    Diabetes Sister    Social History   Tobacco Use   Smoking status: Never   Smokeless tobacco: Current    Types: Chew  Substance Use Topics   Alcohol use: Yes    Alcohol/week: 7.0 standard drinks of alcohol    Types: 7 Cans of beer per week    Comment: Recently cut down from 24 drinks to 7 drinks/week   No Known Allergies  Prior to Admission medications  Medication Sig Start Date End Date Taking? Authorizing Provider  dapagliflozin propanediol (FARXIGA) 10 MG TABS tablet Take 1 tablet (10 mg total) by mouth daily before breakfast. 07/26/21  Yes Alisa Graff, FNP  digoxin (LANOXIN) 0.25 MG tablet Take 0.5 tablet (0.125 mg) by mouth once daily 10/08/21  Yes Dunn, Ryan M, PA-C  diltiazem (CARDIZEM CD) 180 MG 24 hr capsule Take 1 capsule (180 mg total) by mouth daily. 10/08/21  Yes Dunn, Areta Haber, PA-C  metoprolol (TOPROL-XL) 200 MG 24 hr tablet Take 1 tablet  (200 mg total) by mouth daily. 12/03/20  Yes Furth, Cadence H, PA-C  potassium chloride SA (KLOR-CON M20) 20 MEQ tablet Take 0.5 tablets (10 mEq total) by mouth daily as needed (As needed when you take torsemide.). 10/08/21  Yes Dunn, Ryan M, PA-C  sacubitril-valsartan (ENTRESTO) 24-26 MG Take 1 tablet by mouth 2 (two) times daily. 10/09/21  Yes Hackney, Otila Kluver A, FNP  torsemide (DEMADEX) 20 MG tablet Take 1 tablet (20 mg total) by mouth once as needed for up to 1 dose (As needed for swelling or weight gain). 10/08/21  Yes Dunn, Ryan M, PA-C  XARELTO 20 MG TABS tablet Take 1 tablet (20 mg total) by mouth daily with supper. 10/08/21  Yes Dunn, Areta Haber, PA-C  colchicine 0.6 MG tablet Take 1 tablet (0.6 mg total) by mouth 2 (two) times daily. Patient taking differently: Take 0.6 mg by mouth. Take 2 tablets in AM, then 1 tablet in PM for 1 day (start 7/27), then resume taking 1 tablet twice daily for 1 week.  On take August 2, start taking 1 tablet as needed. 05/01/21 05/01/22 Yes Summers, Rhonda L, PA-C  Aspirin-Caffeine (BC FAST PAIN RELIEF PO) Take 1 packet by mouth daily as needed. Patient not taking: Reported on 11/28/2021    [provider]  colchicine 0.6 MG tablet TAKE 1 TABLET BY MOUTH 2 TIMES DAILY. Patient not taking: Reported on 11/28/2021 10/29/21   Minna Merritts, MD     Review of Systems  Constitutional:  Positive for fatigue (minimal). Negative for appetite change.  HENT:  Negative for congestion, postnasal drip and sore throat.   Eyes:  Positive for visual disturbance (blurry vision since entresto).  Respiratory:  Negative for cough, chest tightness and shortness of breath.   Cardiovascular:  Negative for chest pain, palpitations and leg swelling.  Gastrointestinal:  Negative for abdominal distention and abdominal pain.  Endocrine: Negative.   Genitourinary: Negative.   Musculoskeletal:  Positive for arthralgias ("joints all over") and back pain. Negative for neck pain.  Skin:  Negative.   Allergic/Immunologic: Negative.   Neurological:  Positive for dizziness. Negative for light-headedness.  Hematological:  Negative for adenopathy. Does not bruise/bleed easily.  Psychiatric/Behavioral:  Positive for sleep disturbance (sleeping on 1-2 pillows). Negative for dysphoric mood. The patient is nervous/anxious.    Vitals:   11/28/21 1033 11/28/21 1043  BP:  97/68  Pulse:  100  Resp: 16   SpO2: 99%   Weight: 198 lb 6 oz (90 kg)   Height: '5\' 9"'$  (1.753 m)    Wt Readings from Last 3 Encounters:  11/28/21 198 lb 6 oz (90 kg)  10/09/21 201 lb 6 oz (91.3 kg)  10/08/21 201 lb 4 oz (91.3 kg)    Lab Results  Component Value Date   CREATININE 1.16 10/08/2021   CREATININE 0.84 08/23/2021   CREATININE 1.09 07/21/2021    Physical Exam Vitals and nursing note reviewed.  Constitutional:  Appearance: He is well-developed.  HENT:     Head: Normocephalic and atraumatic.  Neck:     Vascular: No JVD.  Cardiovascular:     Rate and Rhythm: Tachycardia present. Rhythm irregular.  Pulmonary:     Effort: Pulmonary effort is normal. No respiratory distress.     Breath sounds: Normal breath sounds. No wheezing or rales.  Abdominal:     General: There is no distension.     Palpations: Abdomen is soft.     Tenderness: There is no abdominal tenderness.  Musculoskeletal:     Cervical back: Normal range of motion and neck supple.     Right lower leg: No tenderness. No edema.     Left lower leg: No tenderness. No edema.  Skin:    General: Skin is warm and dry.  Neurological:     General: No focal deficit present.     Mental Status: He is alert and oriented to person, place, and time.  Psychiatric:        Mood and Affect: Mood is anxious.        Behavior: Behavior normal.   Assessment & Plan:  1: Chronic heart failure with reduced ejection fraction- - NYHA class II - euvolemic today - weighing daily; reminded to call for an overnight weight gain of >2 pounds or a  weekly weight gain of >5 pounds - weight 198 down 3 pounds from last visit here 10/09/21 - not adding salt to his food and tries to be aware of his sodium intake - discussed the importance of keeping his daily fluid intake to ~ 64 ounces - on GDMT of metoprolol & farxiga - only been taking entresto once daily as he says it makes him feel bad - will stop the entresto since he's only been taking it once daily - will check BMP today - BNP 07/17/21 was 545.6  2: Atrial fibrillation/ PE- - saw cardiology (Dunn) 10/08/21 - currently on digoxin, diltiazem, xarelto and metoprolol - dig level 10/09/21 was 1.1  3: HTN- - BP low (97/68) & experiencing dizziness - stopping entresto per above  - Telephone visit PCP Ouida Sills) 04/05/21  - BMP 10/08/21 reviewed and showed sodium 140, potassium 4.8, creatinine 1.16 and GFR >60 - Ordered BMP today  4: Alcohol use- -  Chewing tobacco -  Has not drank any alcohol in the last 2 weeks.  - continued cessation discussed  5: Gout- - Saw Cline (05/09/21) - Uric Acid 6.6 10/09/21 - patient tells the nurse he's taking colchicine daily but then tells the provider he's not taking it daily - emphasized that he not take colchicine daily and that he needs to f/u with PCP or podiatry regarding this   Medication list reviewed. Patient advised to please bring medication bottles to next visit.   Return in 3 month, sooner if needed.

## 2021-11-28 NOTE — Patient Instructions (Addendum)
Continue weighing daily and call for an overnight weight gain of 3 pounds or more or a weekly weight gain of more than 5 pounds.   Stop taking the Entresto.

## 2021-11-29 ENCOUNTER — Telehealth: Payer: Self-pay | Admitting: Family

## 2021-11-29 ENCOUNTER — Other Ambulatory Visit: Payer: Self-pay | Admitting: Family

## 2021-11-29 DIAGNOSIS — I5020 Unspecified systolic (congestive) heart failure: Secondary | ICD-10-CM

## 2021-11-29 NOTE — Telephone Encounter (Signed)
LM for patient regarding BMP results obtained yesterday. Renal function has declined some from last check and is most likely related to the entresto. With stopping the entresto yesterday, kidney function should improve.   Plan to recheck it on 12/12/21. He is to call back for any questions or to change the lab appointment.

## 2021-12-10 ENCOUNTER — Telehealth: Payer: Self-pay

## 2021-12-10 NOTE — Telephone Encounter (Signed)
Patient states he needs to reschedule lab work at [facility]. Previously scheduled for 12/12/21 at 9 AM. Patient requests to come for lab work Friday, 9/29 at 8:30 AM. Provider notified.  Georg Ruddle, RN

## 2021-12-12 ENCOUNTER — Other Ambulatory Visit: Payer: 59

## 2021-12-13 ENCOUNTER — Other Ambulatory Visit
Admission: RE | Admit: 2021-12-13 | Discharge: 2021-12-13 | Disposition: A | Discharge status: Home / Self Care | Payer: 59 | Source: Ambulatory Visit | Attending: Family | Admitting: Family

## 2021-12-13 ENCOUNTER — Encounter
Admission: RE | Admit: 2021-12-13 | Discharge: 2021-12-13 | Disposition: A | Discharge status: Home / Self Care | Payer: 59 | Source: Ambulatory Visit | Attending: Family | Admitting: Family

## 2021-12-13 DIAGNOSIS — I5020 Unspecified systolic (congestive) heart failure: Secondary | ICD-10-CM | POA: Insufficient documentation

## 2021-12-13 LAB — BASIC METABOLIC PANEL
Anion gap: 5 (ref 5–15)
BUN: 16 mg/dL (ref 6–20)
CO2: 27 mmol/L (ref 22–32)
Calcium: 9.4 mg/dL (ref 8.9–10.3)
Chloride: 109 mmol/L (ref 98–111)
Creatinine, Ser: 1.06 mg/dL (ref 0.61–1.24)
GFR, Estimated: >60 mL/min (ref >60)
Glucose, Bld: 105 mg/dL — ABNORMAL HIGH (ref 70–99)
Potassium: 3.8 mmol/L (ref 3.5–5.1)
Sodium: 141 mmol/L (ref 135–145)

## 2021-12-19 ENCOUNTER — Other Ambulatory Visit: Payer: Self-pay | Admitting: Cardiovascular Disease

## 2021-12-19 NOTE — Telephone Encounter (Signed)
Pharmacy requesting refill on Colchicine. Per Christell Faith PA-C on 10/08/21 he recommends that patient minimize his use of colchicine.

## 2021-12-23 ENCOUNTER — Telehealth: Payer: Self-pay | Admitting: Cardiovascular Disease

## 2021-12-23 ENCOUNTER — Other Ambulatory Visit: Payer: Self-pay | Admitting: Cardiovascular Disease

## 2021-12-23 ENCOUNTER — Other Ambulatory Visit: Payer: Self-pay

## 2021-12-23 MED ORDER — METOPROLOL SUCCINATE ER 200 MG PO TB24: 200.0000 mg | ORAL_TABLET | Freq: Every day | ORAL | 1 refills | Status: DC

## 2021-12-23 NOTE — Telephone Encounter (Signed)
*  STAT* If patient is at the pharmacy, call can be transferred to refill team.   1. Which medications need to be refilled? (please list name of each medication and dose if known)   metoprolol (TOPROL-XL) 200 MG 24 hr tablet  2. Which pharmacy/location (including street and city if local pharmacy) is medication to be sent to?  [id number] [address] [place] [address]. Do they need a 30 day or 90 day supply?  30 days  Patient stated he is completely out of this medication.

## 2021-12-23 NOTE — Telephone Encounter (Signed)
Requested Prescriptions   Signed Prescriptions Disp Refills   metoprolol (TOPROL-XL) 200 MG 24 hr tablet 90 tablet 1    Sig: Take 1 tablet (200 mg total) by mouth daily.    Authorizing Provider: Minna Merritts    Ordering User: Janan Ridge

## 2022-01-31 ENCOUNTER — Other Ambulatory Visit: Payer: Self-pay | Admitting: Physician Assistant

## 2022-02-01 ENCOUNTER — Other Ambulatory Visit: Payer: Self-pay | Admitting: Cardiovascular Disease

## 2022-02-04 ENCOUNTER — Other Ambulatory Visit: Payer: Self-pay | Admitting: Family

## 2022-02-18 ENCOUNTER — Ambulatory Visit: Payer: 59 | Admitting: Family

## 2022-02-20 NOTE — Progress Notes (Deleted)
Patient ID: Calvin Esparza, male    DOB: 04/05/1960, 61 y.o.   MRN: [id number]  HPI  Calvin Esparza is a 61 y/o male with a history of atrial fibrillation, PE, alcohol use, tobacco use (chewing tobacco) and chronic heart failure.   Echo report from 07/18/21 reviewed and showed an EF of 40-45% along with mild LVH, mild Calvin and moderate LAE. Echo report from 06/08/19 reviewed and showed an EF of 35-40% along with mild LVH and mild LAE.   Admitted 07/17/21 due to SOB due to HF exacerbation. Initially given IV lasix with transition to oral diuretics. Elevated troponin thought to be due to demand ischemia. Discharged after 4 days.   He presents today for a follow-up visit with a chief complaint of   Past Medical History:  Diagnosis Date   Alcohol abuse    Chronic pain    COVID-19    a. 02/2019   HFrEF (heart failure with reduced ejection fraction) ([place])    a. 05/2019 Echo: EF 35-40%.   History of medication noncompliance    Longstanding persistent atrial fibrillation ([place])    a. Dx 03/2018; b. 05/2019 recurrent Afib in setting of PE. CHA2DS2VASc = 1-->Eliquis later changed to xarelto; b. 07/2019 s/p DCCV (150J); d. 08/2019 admit w/ AF RVR in settting of noncompliance - longstanding persistent afib since on bb/ccb rx.   NICM (nonischemic cardiomyopathy) ([place])    a. 05/2019 Echo: EF 35-40%, gr1 DD. Mod enlarged RV. Mildly dil LA. Mild to mod dil RA. Triv AI; b. 05/2019 MV: EF 32%, no ischemia.   Pulmonary embolism ([place])    a. 05/2019 CTA Chest: mild amt of PE w/in a lower lobe branch of RPA; b. 08/2019 CTA Chest: Small PE in RML and RLL PA.   Past Surgical History:  Procedure Laterality Date   CARDIOVERSION N/A 07/29/2019   Procedure: CARDIOVERSION;  Surgeon: Calvin Merritts, MD;  Location: [facility] ORS;  Service: Cardiovascular;  Laterality: N/A;   COLONOSCOPY WITH PROPOFOL N/A 05/24/2020   Procedure: COLONOSCOPY WITH PROPOFOL;  Surgeon: Calvin Landsman, MD;  Location: [facility] ENDOSCOPY;  Service:  Gastroenterology;  Laterality: N/A;   ESOPHAGOGASTRODUODENOSCOPY (EGD) WITH PROPOFOL N/A 02/27/2020   Procedure: ESOPHAGOGASTRODUODENOSCOPY (EGD) WITH PROPOFOL;  Surgeon: Calvin Landsman, MD;  Location: [facility] ENDOSCOPY;  Service: Gastroenterology;  Laterality: N/A;   ESOPHAGOGASTRODUODENOSCOPY (EGD) WITH PROPOFOL N/A 05/24/2020   Procedure: ESOPHAGOGASTRODUODENOSCOPY (EGD) WITH PROPOFOL;  Surgeon: Calvin Landsman, MD;  Location: [facility] ENDOSCOPY;  Service: Gastroenterology;  Laterality: N/A;   Family History  Problem Relation Age of Onset   Bone cancer Father    Stroke Father    Diabetes Sister    Social History   Tobacco Use   Smoking status: Never   Smokeless tobacco: Current    Types: Chew  Substance Use Topics   Alcohol use: Yes    Alcohol/week: 7.0 standard drinks of alcohol    Types: 7 Cans of beer per week    Comment: He states he hasn't drank in the past 2 weeks but has been a heavy drinker in the past   No Known Allergies     Review of Systems  Constitutional:  Positive for fatigue (minimal). Negative for appetite change.  HENT:  Negative for congestion, postnasal drip and sore throat.   Eyes:  Positive for visual disturbance (blurry vision since entresto).  Respiratory:  Negative for cough, chest tightness and shortness of breath.   Cardiovascular:  Negative for chest pain, palpitations and leg swelling.  Gastrointestinal:  Negative for abdominal distention and abdominal pain.  Endocrine: Negative.   Genitourinary: Negative.   Musculoskeletal:  Positive for arthralgias ("joints all over") and back pain. Negative for neck pain.  Skin: Negative.   Allergic/Immunologic: Negative.   Neurological:  Positive for dizziness. Negative for light-headedness.  Hematological:  Negative for adenopathy. Does not bruise/bleed easily.  Psychiatric/Behavioral:  Positive for sleep disturbance (sleeping on 1-2 pillows). Negative for dysphoric mood. The patient is nervous/anxious.        Physical Exam Vitals and nursing note reviewed.  Constitutional:      Appearance: He is well-developed.  HENT:     Head: Normocephalic and atraumatic.  Neck:     Vascular: No JVD.  Cardiovascular:     Rate and Rhythm: Tachycardia present. Rhythm irregular.  Pulmonary:     Effort: Pulmonary effort is normal. No respiratory distress.     Breath sounds: Normal breath sounds. No wheezing or rales.  Abdominal:     General: There is no distension.     Palpations: Abdomen is soft.     Tenderness: There is no abdominal tenderness.  Musculoskeletal:     Cervical back: Normal range of motion and neck supple.     Right lower leg: No tenderness. No edema.     Left lower leg: No tenderness. No edema.  Skin:    General: Skin is warm and dry.  Neurological:     General: No focal deficit present.     Mental Status: He is alert and oriented to person, place, and time.  Psychiatric:        Mood and Affect: Mood is anxious.        Behavior: Behavior normal.   Assessment & Plan:  1: Chronic heart failure with reduced ejection fraction- - NYHA class II - euvolemic today - weighing daily; reminded to call for an overnight weight gain of >2 pounds or a weekly weight gain of >5 pounds - weight 198.6 pounds from last visit here 3 months ago - not adding salt to his food and tries to be aware of his sodium intake - discussed the importance of keeping his daily fluid intake to ~ 64 ounces - on GDMT of metoprolol & farxiga - only been taking entresto once daily as he says it makes him feel bad - will stop the entresto since he's only been taking it once daily - BNP 07/17/21 was 545.6  2: Atrial fibrillation/ PE- - saw cardiology (Calvin Esparza) 10/08/21 - currently on digoxin, diltiazem, xarelto and metoprolol - dig level 10/09/21 was 1.1  3: HTN- - BP  - Telephone visit with PCP Calvin Esparza) 04/05/21  - BMP 12/13/21 reviewed and showed sodium 141, potassium 3.8, creatinine 1.16 and GFR >60  4:  Alcohol use- -  Chewing tobacco -  Has not drank any alcohol in the last 2 weeks.  - continued cessation discussed   Medication list reviewed. Patient advised to please bring medication bottles to next visit.

## 2022-02-21 ENCOUNTER — Ambulatory Visit: Payer: 59 | Admitting: Family

## 2022-03-02 ENCOUNTER — Other Ambulatory Visit: Payer: Self-pay | Admitting: Family

## 2022-03-14 ENCOUNTER — Ambulatory Visit: Payer: 59 | Admitting: Family

## 2022-03-14 ENCOUNTER — Telehealth: Payer: Self-pay | Admitting: Family

## 2022-03-14 NOTE — Telephone Encounter (Signed)
Patient did not show for his [facility] appointment on 03/14/22. Will attempt to reschedule.

## 2022-04-24 ENCOUNTER — Other Ambulatory Visit: Payer: Self-pay | Admitting: Physician Assistant

## 2022-04-24 ENCOUNTER — Encounter (HOSPITAL_COMMUNITY): Payer: Self-pay | Admitting: *Deleted

## 2022-09-24 ENCOUNTER — Telehealth: Payer: Self-pay | Admitting: *Deleted

## 2022-09-24 NOTE — Telephone Encounter (Signed)
Error

## 2022-10-24 ENCOUNTER — Inpatient Hospital Stay (HOSPITAL_COMMUNITY)
Admit: 2022-10-24 | Discharge: 2022-10-24 | Disposition: A | Discharge status: Home / Self Care | Payer: 59 | Attending: Internal Medicine | Admitting: Internal Medicine

## 2022-10-24 ENCOUNTER — Emergency Department: Payer: 59

## 2022-10-24 ENCOUNTER — Encounter: Payer: Self-pay | Admitting: Emergency Medicine

## 2022-10-24 ENCOUNTER — Other Ambulatory Visit: Payer: Self-pay

## 2022-10-24 ENCOUNTER — Other Ambulatory Visit (HOSPITAL_COMMUNITY): Payer: Self-pay

## 2022-10-24 ENCOUNTER — Inpatient Hospital Stay
Admit: 2022-10-24 | Discharge: 2022-10-24 | Disposition: A | Discharge status: Home / Self Care | Payer: 59 | Attending: Internal Medicine | Admitting: Internal Medicine

## 2022-10-24 ENCOUNTER — Inpatient Hospital Stay
Admission: EM | Admit: 2022-10-24 | Discharge: 2022-10-31 | DRG: 286 | Disposition: A | Discharge status: Home / Self Care | Payer: 59 | Attending: Student in an Organized Health Care Education/Training Program | Admitting: Student in an Organized Health Care Education/Training Program

## 2022-10-24 DIAGNOSIS — I428 Other cardiomyopathies: Secondary | ICD-10-CM | POA: Diagnosis present

## 2022-10-24 DIAGNOSIS — F17220 Nicotine dependence, chewing tobacco, uncomplicated: Secondary | ICD-10-CM | POA: Diagnosis present

## 2022-10-24 DIAGNOSIS — T50996A Underdosing of other drugs, medicaments and biological substances, initial encounter: Secondary | ICD-10-CM | POA: Diagnosis present

## 2022-10-24 DIAGNOSIS — Z7982 Long term (current) use of aspirin: Secondary | ICD-10-CM

## 2022-10-24 DIAGNOSIS — Z7401 Bed confinement status: Secondary | ICD-10-CM | POA: Diagnosis not present

## 2022-10-24 DIAGNOSIS — I4821 Permanent atrial fibrillation: Secondary | ICD-10-CM | POA: Diagnosis not present

## 2022-10-24 DIAGNOSIS — I5043 Acute on chronic combined systolic (congestive) and diastolic (congestive) heart failure: Secondary | ICD-10-CM | POA: Diagnosis present

## 2022-10-24 DIAGNOSIS — E785 Hyperlipidemia, unspecified: Secondary | ICD-10-CM | POA: Diagnosis not present

## 2022-10-24 DIAGNOSIS — Z86711 Personal history of pulmonary embolism: Secondary | ICD-10-CM | POA: Diagnosis not present

## 2022-10-24 DIAGNOSIS — I509 Heart failure, unspecified: Secondary | ICD-10-CM

## 2022-10-24 DIAGNOSIS — Z597 Insufficient social insurance and welfare support: Secondary | ICD-10-CM | POA: Diagnosis not present

## 2022-10-24 DIAGNOSIS — I4891 Unspecified atrial fibrillation: Principal | ICD-10-CM

## 2022-10-24 DIAGNOSIS — I110 Hypertensive heart disease with heart failure: Secondary | ICD-10-CM | POA: Diagnosis not present

## 2022-10-24 DIAGNOSIS — R0609 Other forms of dyspnea: Secondary | ICD-10-CM

## 2022-10-24 DIAGNOSIS — Z91128 Patient's intentional underdosing of medication regimen for other reason: Secondary | ICD-10-CM | POA: Diagnosis not present

## 2022-10-24 DIAGNOSIS — I700 Atherosclerosis of aorta: Secondary | ICD-10-CM | POA: Diagnosis not present

## 2022-10-24 DIAGNOSIS — K5900 Constipation, unspecified: Secondary | ICD-10-CM | POA: Diagnosis not present

## 2022-10-24 DIAGNOSIS — Z91148 Patient's other noncompliance with medication regimen for other reason: Secondary | ICD-10-CM | POA: Diagnosis not present

## 2022-10-24 DIAGNOSIS — E876 Hypokalemia: Secondary | ICD-10-CM | POA: Diagnosis present

## 2022-10-24 DIAGNOSIS — Z79899 Other long term (current) drug therapy: Secondary | ICD-10-CM | POA: Diagnosis not present

## 2022-10-24 DIAGNOSIS — Z7901 Long term (current) use of anticoagulants: Secondary | ICD-10-CM

## 2022-10-24 DIAGNOSIS — I272 Pulmonary hypertension, unspecified: Secondary | ICD-10-CM | POA: Diagnosis present

## 2022-10-24 DIAGNOSIS — Z833 Family history of diabetes mellitus: Secondary | ICD-10-CM

## 2022-10-24 DIAGNOSIS — I5023 Acute on chronic systolic (congestive) heart failure: Secondary | ICD-10-CM | POA: Diagnosis not present

## 2022-10-24 DIAGNOSIS — I959 Hypotension, unspecified: Secondary | ICD-10-CM | POA: Diagnosis present

## 2022-10-24 DIAGNOSIS — Z823 Family history of stroke: Secondary | ICD-10-CM | POA: Diagnosis not present

## 2022-10-24 DIAGNOSIS — R590 Localized enlarged lymph nodes: Secondary | ICD-10-CM | POA: Diagnosis not present

## 2022-10-24 DIAGNOSIS — F1010 Alcohol abuse, uncomplicated: Secondary | ICD-10-CM | POA: Diagnosis not present

## 2022-10-24 DIAGNOSIS — I771 Stricture of artery: Secondary | ICD-10-CM | POA: Diagnosis not present

## 2022-10-24 DIAGNOSIS — Z452 Encounter for adjustment and management of vascular access device: Secondary | ICD-10-CM | POA: Diagnosis not present

## 2022-10-24 DIAGNOSIS — I5021 Acute systolic (congestive) heart failure: Secondary | ICD-10-CM | POA: Diagnosis not present

## 2022-10-24 DIAGNOSIS — R112 Nausea with vomiting, unspecified: Secondary | ICD-10-CM | POA: Diagnosis not present

## 2022-10-24 DIAGNOSIS — I517 Cardiomegaly: Secondary | ICD-10-CM | POA: Diagnosis not present

## 2022-10-24 DIAGNOSIS — R0789 Other chest pain: Secondary | ICD-10-CM | POA: Diagnosis not present

## 2022-10-24 DIAGNOSIS — R109 Unspecified abdominal pain: Secondary | ICD-10-CM | POA: Diagnosis not present

## 2022-10-24 DIAGNOSIS — R079 Chest pain, unspecified: Secondary | ICD-10-CM

## 2022-10-24 LAB — CBC
HCT: 43.5 % (ref 39.0–52.0)
Hemoglobin: 14.3 g/dL (ref 13.0–17.0)
MCH: 28.9 pg (ref 26.0–34.0)
MCHC: 32.9 g/dL (ref 30.0–36.0)
MCV: 87.9 fL (ref 80.0–100.0)
Platelets: 228 10*3/uL (ref 150–400)
RBC: 4.95 MIL/uL (ref 4.22–5.81)
RDW: 16.9 % — ABNORMAL HIGH (ref 11.5–15.5)
WBC: 7.2 10*3/uL (ref 4.0–10.5)
nRBC: 0 % (ref 0.0–0.2)

## 2022-10-24 LAB — BASIC METABOLIC PANEL
Anion gap: 13 (ref 5–15)
BUN: 22 mg/dL — ABNORMAL HIGH (ref 6–20)
CO2: 26 mmol/L (ref 22–32)
Calcium: 9.1 mg/dL (ref 8.9–10.3)
Chloride: 102 mmol/L (ref 98–111)
Creatinine, Ser: 1.2 mg/dL (ref 0.61–1.24)
GFR, Estimated: >60 mL/min (ref >60)
Glucose, Bld: 160 mg/dL — ABNORMAL HIGH (ref 70–99)
Potassium: 3.8 mmol/L (ref 3.5–5.1)
Sodium: 141 mmol/L (ref 135–145)

## 2022-10-24 LAB — TROPONIN I (HIGH SENSITIVITY)
Troponin I (High Sensitivity): 15 ng/L (ref <18)
Troponin I (High Sensitivity): 14 ng/L (ref <18)

## 2022-10-24 LAB — ECHOCARDIOGRAM COMPLETE
AR max vel: 2.14 cm2
AV Area VTI: 1.98 cm2
AV Area mean vel: 1.93 cm2
AV Mean grad: 2 mmHg
AV Peak grad: 3.2 mmHg
Ao pk vel: 0.9 m/s
Area-P 1/2: 4.68 cm2
Height: 70 in
MV VTI: 2.08 cm2
S' Lateral: 5.1 cm
Weight: 3120 oz

## 2022-10-24 LAB — HIV ANTIBODY (ROUTINE TESTING W REFLEX): HIV Screen 4th Generation wRfx: NONREACTIVE

## 2022-10-24 LAB — APTT: aPTT: 27 seconds (ref 24–36)

## 2022-10-24 LAB — HEPARIN LEVEL (UNFRACTIONATED)
Heparin Unfractionated: <0.1 IU/mL — ABNORMAL LOW (ref 0.30–0.70)
Heparin Unfractionated: 0.52 IU/mL (ref 0.30–0.70)
Heparin Unfractionated: 0.44 IU/mL (ref 0.30–0.70)

## 2022-10-24 LAB — HEMOGLOBIN A1C
Hgb A1c MFr Bld: 6.4 % — ABNORMAL HIGH (ref 4.8–5.6)
Mean Plasma Glucose: 136.98 mg/dL

## 2022-10-24 LAB — GLUCOSE, CAPILLARY
Glucose-Capillary: 134 mg/dL — ABNORMAL HIGH (ref 70–99)
Glucose-Capillary: 122 mg/dL — ABNORMAL HIGH (ref 70–99)
Glucose-Capillary: 106 mg/dL — ABNORMAL HIGH (ref 70–99)
Glucose-Capillary: 122 mg/dL — ABNORMAL HIGH (ref 70–99)
Glucose-Capillary: 127 mg/dL — ABNORMAL HIGH (ref 70–99)

## 2022-10-24 LAB — PROTIME-INR
INR: 1.2 (ref 0.8–1.2)
Prothrombin Time: 15.1 seconds (ref 11.4–15.2)

## 2022-10-24 LAB — BRAIN NATRIURETIC PEPTIDE: B Natriuretic Peptide: 910.5 pg/mL — ABNORMAL HIGH (ref 0.0–100.0)

## 2022-10-24 MED ORDER — PERFLUTREN LIPID MICROSPHERE
1.0000 mL | INTRAVENOUS | Status: AC | PRN
  Administered 2022-10-24: 5 mL via INTRAVENOUS

## 2022-10-24 MED ORDER — THIAMINE MONONITRATE 100 MG PO TABS
100.0000 mg | ORAL_TABLET | Freq: Every day | ORAL | Status: DC
  Administered 2022-10-24 – 2022-10-31 (×8): 100 mg via ORAL
  Filled 2022-10-24 (×8): qty 1

## 2022-10-24 MED ORDER — SODIUM CHLORIDE 0.9 % IV SOLN: 250.0000 mL | INTRAVENOUS | Status: DC | PRN

## 2022-10-24 MED ORDER — METOPROLOL TARTRATE 50 MG PO TABS: 50.0000 mg | ORAL_TABLET | Freq: Two times a day (BID) | ORAL | Status: DC

## 2022-10-24 MED ORDER — METOPROLOL TARTRATE 50 MG PO TABS
50.0000 mg | ORAL_TABLET | Freq: Two times a day (BID) | ORAL | Status: AC
  Administered 2022-10-24 – 2022-10-26 (×6): 50 mg via ORAL
  Filled 2022-10-24 (×6): qty 1

## 2022-10-24 MED ORDER — LORAZEPAM 1 MG PO TABS
1.0000 mg | ORAL_TABLET | ORAL | Status: AC | PRN
  Administered 2022-10-25: 1 mg via ORAL
  Filled 2022-10-24: qty 1

## 2022-10-24 MED ORDER — SODIUM CHLORIDE 0.9% FLUSH: 3.0000 mL | INTRAVENOUS | Status: DC | PRN

## 2022-10-24 MED ORDER — LORAZEPAM 2 MG/ML IJ SOLN
1.0000 mg | INTRAMUSCULAR | Status: AC | PRN
  Administered 2022-10-24: 1 mg via INTRAVENOUS
  Filled 2022-10-24: qty 1

## 2022-10-24 MED ORDER — ONDANSETRON HCL 4 MG/2ML IJ SOLN
4.0000 mg | Freq: Four times a day (QID) | INTRAMUSCULAR | Status: DC | PRN
  Administered 2022-10-26 – 2022-10-30 (×2): 4 mg via INTRAVENOUS
  Filled 2022-10-24 (×3): qty 2

## 2022-10-24 MED ORDER — METOPROLOL TARTRATE 5 MG/5ML IV SOLN
5.0000 mg | INTRAVENOUS | Status: AC | PRN
  Administered 2022-10-24 (×3): 5 mg via INTRAVENOUS
  Filled 2022-10-24 (×2): qty 5

## 2022-10-24 MED ORDER — IOHEXOL 350 MG/ML SOLN
75.0000 mL | Freq: Once | INTRAVENOUS | Status: AC | PRN
  Administered 2022-10-24: 75 mL via INTRAVENOUS

## 2022-10-24 MED ORDER — METOPROLOL TARTRATE 5 MG/5ML IV SOLN
5.0000 mg | INTRAVENOUS | Status: DC | PRN
  Administered 2022-10-24 – 2022-10-25 (×2): 5 mg via INTRAVENOUS
  Filled 2022-10-24 (×2): qty 5

## 2022-10-24 MED ORDER — DIGOXIN 0.25 MG/ML IJ SOLN
0.2500 mg | Freq: Once | INTRAMUSCULAR | Status: AC
  Administered 2022-10-24: 0.25 mg via INTRAVENOUS
  Filled 2022-10-24 (×2): qty 2

## 2022-10-24 MED ORDER — INSULIN ASPART 100 UNIT/ML IJ SOLN
0.0000 [IU] | Freq: Three times a day (TID) | INTRAMUSCULAR | Status: DC
  Administered 2022-10-24: 2 [IU] via SUBCUTANEOUS
  Filled 2022-10-24: qty 1

## 2022-10-24 MED ORDER — METOPROLOL TARTRATE 50 MG PO TABS
50.0000 mg | ORAL_TABLET | Freq: Once | ORAL | Status: AC
  Administered 2022-10-24: 50 mg via ORAL
  Filled 2022-10-24: qty 1

## 2022-10-24 MED ORDER — INSULIN ASPART 100 UNIT/ML IJ SOLN: 0.0000 [IU] | Freq: Every day | INTRAMUSCULAR | Status: DC

## 2022-10-24 MED ORDER — SODIUM CHLORIDE 0.9% FLUSH
3.0000 mL | Freq: Two times a day (BID) | INTRAVENOUS | Status: DC
  Administered 2022-10-24 – 2022-10-27 (×7): 3 mL via INTRAVENOUS

## 2022-10-24 MED ORDER — DIGOXIN 125 MCG PO TABS
0.1250 mg | ORAL_TABLET | Freq: Every day | ORAL | Status: DC
  Administered 2022-10-25 – 2022-10-31 (×7): 0.125 mg via ORAL
  Filled 2022-10-24 (×7): qty 1

## 2022-10-24 MED ORDER — FOLIC ACID 1 MG PO TABS
1.0000 mg | ORAL_TABLET | Freq: Every day | ORAL | Status: DC
  Administered 2022-10-24 – 2022-10-31 (×8): 1 mg via ORAL
  Filled 2022-10-24 (×8): qty 1

## 2022-10-24 MED ORDER — ADULT MULTIVITAMIN W/MINERALS CH
1.0000 | ORAL_TABLET | Freq: Every day | ORAL | Status: DC
  Administered 2022-10-24 – 2022-10-31 (×8): 1 via ORAL
  Filled 2022-10-24 (×8): qty 1

## 2022-10-24 MED ORDER — FUROSEMIDE 10 MG/ML IJ SOLN
80.0000 mg | Freq: Once | INTRAMUSCULAR | Status: AC
  Administered 2022-10-24: 80 mg via INTRAVENOUS
  Filled 2022-10-24: qty 8

## 2022-10-24 MED ORDER — HEPARIN BOLUS VIA INFUSION
5000.0000 [IU] | Freq: Once | INTRAVENOUS | Status: AC
  Administered 2022-10-24: 5000 [IU] via INTRAVENOUS
  Filled 2022-10-24: qty 5000

## 2022-10-24 MED ORDER — FUROSEMIDE 10 MG/ML IJ SOLN
40.0000 mg | Freq: Two times a day (BID) | INTRAMUSCULAR | Status: DC
  Administered 2022-10-24 – 2022-10-26 (×4): 40 mg via INTRAVENOUS
  Filled 2022-10-24 (×4): qty 4

## 2022-10-24 MED ORDER — FUROSEMIDE 10 MG/ML IJ SOLN
40.0000 mg | Freq: Once | INTRAMUSCULAR | Status: AC
  Administered 2022-10-24: 40 mg via INTRAVENOUS
  Filled 2022-10-24: qty 4

## 2022-10-24 MED ORDER — DAPAGLIFLOZIN PROPANEDIOL 10 MG PO TABS
10.0000 mg | ORAL_TABLET | Freq: Every day | ORAL | Status: DC
  Administered 2022-10-25 – 2022-10-31 (×7): 10 mg via ORAL
  Filled 2022-10-24 (×8): qty 1

## 2022-10-24 MED ORDER — ACETAMINOPHEN 325 MG PO TABS
650.0000 mg | ORAL_TABLET | ORAL | Status: DC | PRN
  Administered 2022-10-25 – 2022-10-29 (×3): 650 mg via ORAL
  Filled 2022-10-24 (×3): qty 2

## 2022-10-24 MED ORDER — THIAMINE HCL 100 MG/ML IJ SOLN
100.0000 mg | Freq: Every day | INTRAMUSCULAR | Status: DC
  Filled 2022-10-24 (×2): qty 2

## 2022-10-24 MED ORDER — HEPARIN (PORCINE) 25000 UT/250ML-% IV SOLN
1350.0000 [IU]/h | INTRAVENOUS | Status: DC
  Administered 2022-10-24 – 2022-10-26 (×4): 1350 [IU]/h via INTRAVENOUS
  Filled 2022-10-24 (×4): qty 250

## 2022-10-24 NOTE — ED Notes (Signed)
ED Provider at bedside. 

## 2022-10-24 NOTE — ED Notes (Signed)
ED TO INPATIENT HANDOFF REPORT  ED Nurse Name and Phone #: Delice Bison, RN  S Name/Age/Gender [name] 62 y.o. male Room/Bed: ED14A/ED14A  Code Status   Code Status: Full Code  Home/SNF/Other Home Patient oriented to: self, place, time, and situation Is this baseline? Yes   Triage Complete: Triage complete  Chief Complaint CHF (congestive heart failure) (HCC) [I50.9]  Triage Note Patient ambulatory to triage with slow, steady gait, frequently clearing throat; pt c/o mid CP, nonradiating accomp by [facility]; denies cough; st hx CHF and takes lasix PRN   Allergies No Known Allergies  Level of Care/Admitting Diagnosis ED Disposition     ED Disposition  Admit   Condition  --   Comment  Hospital Area: [facility] [[id number]]  Level of Care: Progressive [102]  Admit to Progressive based on following criteria: CARDIOVASCULAR & THORACIC of moderate stability with acute coronary syndrome symptoms/low risk myocardial infarction/hypertensive urgency/arrhythmias/heart failure potentially compromising stability and stable post cardiovascular intervention patients.  Covid Evaluation: Asymptomatic - no recent exposure (last 10 days) testing not required  Diagnosis: CHF (congestive heart failure) [facility]) [[id number]]  Admitting Physician: Emeline General [[id number]]  Attending Physician: Emeline General [[id number]]  Certification:: I certify this patient will need inpatient services for at least 2 midnights  Estimated Length of Stay: 2          B Medical/Surgery History Past Medical History:  Diagnosis Date   Alcohol abuse    Chronic pain    COVID-19    a. 02/2019   HFrEF (heart failure with reduced ejection fraction) (HCC)    a. 05/2019 Echo: EF 35-40%.   History of medication noncompliance    Longstanding persistent atrial fibrillation (HCC)    a. Dx 03/2018; b. 05/2019 recurrent Afib in setting of PE. CHA2DS2VASc = 1-->Eliquis later changed to xarelto; b. 07/2019 s/p  DCCV (150J); d. 08/2019 admit w/ AF RVR in settting of noncompliance - longstanding persistent afib since on bb/ccb rx.   NICM (nonischemic cardiomyopathy) (HCC)    a. 05/2019 Echo: EF 35-40%, gr1 DD. Mod enlarged RV. Mildly dil LA. Mild to mod dil RA. Triv AI; b. 05/2019 MV: EF 32%, no ischemia.   Pulmonary embolism (HCC)    a. 05/2019 CTA Chest: mild amt of PE w/in a lower lobe branch of RPA; b. 08/2019 CTA Chest: Small PE in RML and RLL PA.   Past Surgical History:  Procedure Laterality Date   CARDIOVERSION N/A 07/29/2019   Procedure: CARDIOVERSION;  Surgeon: Antonieta Iba, MD;  Location: [facility] ORS;  Service: Cardiovascular;  Laterality: N/A;   COLONOSCOPY WITH PROPOFOL N/A 05/24/2020   Procedure: COLONOSCOPY WITH PROPOFOL;  Surgeon: Toney Reil, MD;  Location: [facility] ENDOSCOPY;  Service: Gastroenterology;  Laterality: N/A;   ESOPHAGOGASTRODUODENOSCOPY (EGD) WITH PROPOFOL N/A 02/27/2020   Procedure: ESOPHAGOGASTRODUODENOSCOPY (EGD) WITH PROPOFOL;  Surgeon: Toney Reil, MD;  Location: [facility] ENDOSCOPY;  Service: Gastroenterology;  Laterality: N/A;   ESOPHAGOGASTRODUODENOSCOPY (EGD) WITH PROPOFOL N/A 05/24/2020   Procedure: ESOPHAGOGASTRODUODENOSCOPY (EGD) WITH PROPOFOL;  Surgeon: Toney Reil, MD;  Location: [facility] ENDOSCOPY;  Service: Gastroenterology;  Laterality: N/A;     A IV Location/Drains/Wounds Patient Lines/Drains/Airways Status     Active Line/Drains/Airways     Name Placement date Placement time Site Days   Peripheral IV 10/24/22 20 G 1" Right Antecubital 10/24/22  0512  Antecubital  less than 1   Peripheral IV 10/24/22 18 G 1" Left Antecubital 10/24/22  0518  Antecubital  less than  1            Intake/Output Last 24 hours  Intake/Output Summary (Last 24 hours) at 10/24/2022 0803 Last data filed at 10/24/2022 0981 Gross per 24 hour  Intake --  Output 225 ml  Net -225 ml    Labs/Imaging Results for orders placed or performed during the hospital  encounter of 10/24/22 (from the past 48 hour(s))  Basic metabolic panel     Status: Abnormal   Collection Time: 10/24/22  5:13 AM  Result Value Ref Range   Sodium 141 135 - 145 mmol/L   Potassium 3.8 3.5 - 5.1 mmol/L   Chloride 102 98 - 111 mmol/L   CO2 26 22 - 32 mmol/L   Glucose, Bld 160 (H) 70 - 99 mg/dL    Comment: Glucose reference range applies only to samples taken after fasting for at least 8 hours.   BUN 22 (H) 6 - 20 mg/dL   Creatinine, Ser 1.91 0.61 - 1.24 mg/dL   Calcium 9.1 8.9 - 47.8 mg/dL   GFR, Estimated >29 >56 mL/min    Comment: (NOTE) Calculated using the CKD-EPI Creatinine Equation (2021)    Anion gap 13 5 - 15    Comment: Performed at [facility], [address] [place], [place] [address]  CBC     Status: Abnormal   Collection Time: 10/24/22  5:13 AM  Result Value Ref Range   WBC 7.2 4.0 - 10.5 K/uL   RBC 4.95 4.22 - 5.81 MIL/uL   Hemoglobin 14.3 13.0 - 17.0 g/dL   HCT 65.7 84.6 - 96.2 %   MCV 87.9 80.0 - 100.0 fL   MCH 28.9 26.0 - 34.0 pg   MCHC 32.9 30.0 - 36.0 g/dL   RDW 95.2 (H) 84.1 - 32.4 %   Platelets 228 150 - 400 K/uL   nRBC 0.0 0.0 - 0.2 %    Comment: Performed at [facility], [address] [place], [place] [address]  Troponin I (High Sensitivity)     Status: None   Collection Time: 10/24/22  5:13 AM  Result Value Ref Range   Troponin I (High Sensitivity) 15 <18 ng/L    Comment: (NOTE) Elevated high sensitivity troponin I (hsTnI) values and significant  changes across serial measurements may suggest ACS but many other  chronic and acute conditions are known to elevate hsTnI results.  Refer to the "Links" section for chest pain algorithms and additional  guidance. Performed at [facility], [address] [place], [place] [address]   Brain natriuretic peptide     Status: Abnormal   Collection Time: 10/24/22  5:13 AM  Result Value Ref Range   B Natriuretic Peptide 910.5 (H) 0.0 - 100.0 pg/mL    Comment:  Performed at [facility], [address] [place], [place] [address]  APTT     Status: None   Collection Time: 10/24/22  5:43 AM  Result Value Ref Range   aPTT 27 24 - 36 seconds    Comment: Performed at [facility], [address] [place], [place] [address]  Heparin level (unfractionated)     Status: Abnormal   Collection Time: 10/24/22  5:43 AM  Result Value Ref Range   Heparin Unfractionated <0.10 (L) 0.30 - 0.70 IU/mL    Comment: (NOTE) The clinical reportable range upper limit is being lowered to >1.10 to align with the FDA approved guidance for the current laboratory assay.  If heparin results are below expected  values, and patient dosage has  been confirmed, suggest follow up testing of antithrombin III levels. Performed at [facility], [address] [place], [place] [address]   Protime-INR     Status: None   Collection Time: 10/24/22  5:43 AM  Result Value Ref Range   Prothrombin Time 15.1 11.4 - 15.2 seconds   INR 1.2 0.8 - 1.2    Comment: Performed at [facility], [address] [place], [place] [address]   CT Angio Chest PE W/Cm &/Or Wo Cm  Result Date: 10/24/2022 CLINICAL DATA:  62 year old male with history of mid chest pain and shortness of breath. EXAM: CT ANGIOGRAPHY CHEST WITH CONTRAST TECHNIQUE: Multidetector CT imaging of the chest was performed using the standard protocol during bolus administration of intravenous contrast. Multiplanar CT image reconstructions and MIPs were obtained to evaluate the vascular anatomy. RADIATION DOSE REDUCTION: This exam was performed according to the departmental dose-optimization program which includes automated exposure control, adjustment of the mA and/or kV according to patient size and/or use of iterative reconstruction technique. CONTRAST:  75mL OMNIPAQUE IOHEXOL 350 MG/ML SOLN COMPARISON:  Chest CTA 09/08/2019. FINDINGS: Cardiovascular: No filling defects are noted within the  pulmonary arterial tree to suggest pulmonary embolism. Heart size is enlarged with left atrial dilatation. There is no significant pericardial fluid, thickening or pericardial calcification. No atherosclerotic calcifications are noted in the thoracic aorta or the coronary arteries. Mediastinum/Nodes: Multiple prominent borderline enlarged mediastinal and bilateral hilar lymph nodes are noted, nonspecific. Mildly enlarged right paratracheal lymph node (axial image 41 of series 4) currently measuring 1.6 cm in short axis (previously 1.3 cm on 09/08/2019). Esophagus is unremarkable in appearance. No axillary lymphadenopathy. Lungs/Pleura: No acute consolidative airspace disease. No pleural effusions. No suspicious appearing pulmonary nodules or masses are noted. Upper Abdomen: Aortic atherosclerosis. Musculoskeletal: There are no aggressive appearing lytic or blastic lesions noted in the visualized portions of the skeleton. Review of the MIP images confirms the above findings. IMPRESSION: 1. No acute findings are noted in the thorax to account for the patient's symptoms. Specifically, no evidence of pulmonary embolism. 2. Cardiomegaly with left atrial dilatation. 3. Mildly enlarged right paratracheal lymph node, only minimally increased in size compared to prior examination from 09/08/2019. This is nonspecific and favored to be benign. Follow-up contrast-enhanced chest CT is recommended in 6 months to ensure the stability or regression of this finding. Electronically Signed   By: Trudie Reed M.D.   On: 10/24/2022 06:29   DG Chest Port 1 View  Result Date: 10/24/2022 CLINICAL DATA:  62 year old male with history of chest pain. EXAM: PORTABLE CHEST 1 VIEW COMPARISON:  Chest x-ray 07/17/2021. FINDINGS: Lung volumes are normal. No consolidative airspace disease. No pleural effusions. No pneumothorax. No evidence of pulmonary edema. Heart size is mildly enlarged. The patient is rotated to the right on today's exam,  resulting in distortion of the mediastinal contours and reduced diagnostic sensitivity and specificity for mediastinal pathology. IMPRESSION: 1. No radiographic evidence of acute cardiopulmonary disease. 2. Mild cardiomegaly. Electronically Signed   By: Trudie Reed M.D.   On: 10/24/2022 05:30    Pending Labs Unresulted Labs (From admission, onward)     Start     Ordered   10/25/22 0500  Basic metabolic panel  Daily,   STAT     Comments: As Scheduled for 5 days    10/24/22 0749   10/24/22 1200  Heparin level (unfractionated)  Once-Timed,   URGENT  10/24/22 0620   10/24/22 0751  Hemoglobin A1c  Once,   R       Comments: To assess prior glycemic control    10/24/22 0750   10/24/22 0748  HIV Antibody (routine testing w rflx)  (HIV Antibody (Routine testing w reflex) panel)  Once,   R        10/24/22 0749            Vitals/Pain Today's Vitals   10/24/22 0623 10/24/22 0630 10/24/22 0700 10/24/22 0730  BP: (!) 120/93 (!) 111/90 (!) 120/103 (!) 118/106  Pulse: (!) 126 (!) 133 (!) 160 (!) 125  Resp:  14  15  Temp:      TempSrc:      SpO2:  99% 96% 100%  Weight:      Height:      PainSc:        Isolation Precautions No active isolations  Medications Medications  heparin ADULT infusion 100 units/mL (25000 units/255mL) (1,350 Units/hr Intravenous New Bag/Given 10/24/22 0556)  digoxin (LANOXIN) 0.25 MG/ML injection 0.25 mg (has no administration in time range)  metoprolol tartrate (LOPRESSOR) injection 5 mg (5 mg Intravenous Given 10/24/22 0735)  metoprolol tartrate (LOPRESSOR) tablet 50 mg (has no administration in time range)  digoxin (LANOXIN) tablet 0.125 mg (has no administration in time range)  furosemide (LASIX) injection 40 mg (has no administration in time range)  dapagliflozin propanediol (FARXIGA) tablet 10 mg (has no administration in time range)  sodium chloride flush (NS) 0.9 % injection 3 mL (has no administration in time range)  sodium chloride flush (NS)  0.9 % injection 3 mL (has no administration in time range)  0.9 %  sodium chloride infusion (has no administration in time range)  acetaminophen (TYLENOL) tablet 650 mg (has no administration in time range)  ondansetron (ZOFRAN) injection 4 mg (has no administration in time range)  insulin aspart (novoLOG) injection 0-15 Units (has no administration in time range)  insulin aspart (novoLOG) injection 0-5 Units (has no administration in time range)  LORazepam (ATIVAN) tablet 1-4 mg (has no administration in time range)    Or  LORazepam (ATIVAN) injection 1-4 mg (has no administration in time range)  thiamine (VITAMIN B1) tablet 100 mg (has no administration in time range)    Or  thiamine (VITAMIN B1) injection 100 mg (has no administration in time range)  folic acid (FOLVITE) tablet 1 mg (has no administration in time range)  multivitamin with minerals tablet 1 tablet (has no administration in time range)  furosemide (LASIX) injection 40 mg (40 mg Intravenous Given 10/24/22 0531)  metoprolol tartrate (LOPRESSOR) injection 5 mg (5 mg Intravenous Given 10/24/22 0545)  heparin bolus via infusion 5,000 Units (5,000 Units Intravenous Bolus from Bag 10/24/22 0557)  iohexol (OMNIPAQUE) 350 MG/ML injection 75 mL (75 mLs Intravenous Contrast Given 10/24/22 0610)  metoprolol tartrate (LOPRESSOR) tablet 50 mg (50 mg Oral Given 10/24/22 0623)  furosemide (LASIX) injection 80 mg (80 mg Intravenous Given 10/24/22 0649)    Mobility walks     Focused Assessments Cardiac Assessment Handoff:  Cardiac Rhythm: Atrial fibrillation No results found for: "CKTOTAL", "CKMB", "CKMBINDEX", "TROPONINI" No results found for: "DDIMER" Does the Patient currently have chest pain? No    R Recommendations: See Admitting Provider Note  Report given to:   Additional Notes:

## 2022-10-24 NOTE — H&P (Signed)
History and Physical    Calvin Esparza ZOX:[id number] DOB: 12/30/1960 DOA: 10/24/2022  PCP: Lauro Regulus, MD (Confirm with patient/family/NH records and if not entered, this has to be entered at [facility] point of entry) Patient coming from: Home  I have personally briefly reviewed patient's old medical records in [facility]  Chief Complaint: Feeling tired, SOB, leg swelling  HPI: Calvin Esparza is a 62 y.o. male with medical history significant of chronic HFrEF with LVEF 30-40%, HTN, HLD, gout, PE and PAF on Xarelto, alcohol abuse, presented with worsening of fatigue, exertional dyspnea, and increasing leg swelling.  Patient stopped taking all his medications since January this year due to insurance coverage issue.  Started about 2 months ago, patient started to feel fatigued, exertional dyspnea and gradually getting worse.  Last week patient experienced worsening of his symptoms and significant weight gaining of about 7 pound in 2 weeks and started to have orthopnea.  He also complained about palpitations this week.  Denies any chest pain no cough.  ED Course: Afebrile, heart rate in the 170s, blood pressure 140s/100, not hypoxic, chest x-ray showed cardiomegaly and pulmonary congestion, CT angiogram negative for PE.  Creatinine 1.2, K3.8, WBC 7.2, hemoglobin 14.3.  Patient was given p.o. metoprolol, IV Lasix x 1 and heart rate dropped to 110-130s.  Review of Systems: As per HPI otherwise 10 point review of systems negative.    Past Medical History:  Diagnosis Date   Alcohol abuse    Chronic pain    COVID-19    a. 02/2019   HFrEF (heart failure with reduced ejection fraction) (HCC)    a. 05/2019 Echo: EF 35-40%.   History of medication noncompliance    Longstanding persistent atrial fibrillation (HCC)    a. Dx 03/2018; b. 05/2019 recurrent Afib in setting of PE. CHA2DS2VASc = 1-->Eliquis later changed to xarelto; b. 07/2019 s/p DCCV (150J); d. 08/2019 admit w/ AF RVR in  settting of noncompliance - longstanding persistent afib since on bb/ccb rx.   NICM (nonischemic cardiomyopathy) (HCC)    a. 05/2019 Echo: EF 35-40%, gr1 DD. Mod enlarged RV. Mildly dil LA. Mild to mod dil RA. Triv AI; b. 05/2019 MV: EF 32%, no ischemia.   Pulmonary embolism (HCC)    a. 05/2019 CTA Chest: mild amt of PE w/in a lower lobe branch of RPA; b. 08/2019 CTA Chest: Small PE in RML and RLL PA.    Past Surgical History:  Procedure Laterality Date   CARDIOVERSION N/A 07/29/2019   Procedure: CARDIOVERSION;  Surgeon: Antonieta Iba, MD;  Location: [facility] ORS;  Service: Cardiovascular;  Laterality: N/A;   COLONOSCOPY WITH PROPOFOL N/A 05/24/2020   Procedure: COLONOSCOPY WITH PROPOFOL;  Surgeon: Toney Reil, MD;  Location: [facility] ENDOSCOPY;  Service: Gastroenterology;  Laterality: N/A;   ESOPHAGOGASTRODUODENOSCOPY (EGD) WITH PROPOFOL N/A 02/27/2020   Procedure: ESOPHAGOGASTRODUODENOSCOPY (EGD) WITH PROPOFOL;  Surgeon: Toney Reil, MD;  Location: [facility] ENDOSCOPY;  Service: Gastroenterology;  Laterality: N/A;   ESOPHAGOGASTRODUODENOSCOPY (EGD) WITH PROPOFOL N/A 05/24/2020   Procedure: ESOPHAGOGASTRODUODENOSCOPY (EGD) WITH PROPOFOL;  Surgeon: Toney Reil, MD;  Location: [facility] ENDOSCOPY;  Service: Gastroenterology;  Laterality: N/A;     reports that he has never smoked. His smokeless tobacco use includes chew. He reports current alcohol use of about 7.0 standard drinks of alcohol per week. He reports that he does not currently use drugs.  No Known Allergies  Family History  Problem Relation Age of Onset   Bone cancer Father  Stroke Father    Diabetes Sister      Prior to Admission medications   Medication Sig Start Date End Date Taking? Authorizing Provider  Aspirin-Caffeine (BC FAST PAIN RELIEF PO) Take 1 packet by mouth daily as needed.    [provider]  colchicine 0.6 MG tablet TAKE 1 TABLET BY MOUTH 2 TIMES DAILY. Patient not taking: Reported on  11/28/2021 10/29/21   Antonieta Iba, MD  digoxin (LANOXIN) 0.25 MG tablet TAKE 0.5 TABLET (0.125 MG) BY MOUTH ONCE DAILY 01/31/22   Dunn, Raymon Mutton, PA-C  diltiazem (CARDIZEM CD) 180 MG 24 hr capsule Take 1 capsule (180 mg total) by mouth daily. 10/08/21   Dunn, Ryan M, PA-C  FARXIGA 10 MG TABS tablet TAKE 1 TABLET BY MOUTH DAILY BEFORE BREAKFAST. 02/04/22   Clarisa Kindred A, FNP  metoprolol (TOPROL-XL) 200 MG 24 hr tablet Take 1 tablet (200 mg total) by mouth daily. 12/23/21   Antonieta Iba, MD  potassium chloride SA (KLOR-CON M20) 20 MEQ tablet Take 0.5 tablets (10 mEq total) by mouth daily as needed (As needed when you take torsemide.). 10/08/21   Sondra Barges, PA-C  torsemide (DEMADEX) 20 MG tablet Take 1 tablet (20 mg total) by mouth once as needed for up to 1 dose (As needed for swelling or weight gain). 10/08/21   Dunn, Ryan M, PA-C  XARELTO 20 MG TABS tablet Take 1 tablet (20 mg total) by mouth daily with supper. 10/08/21   Sondra Barges, PA-C    Physical Exam: Vitals:   10/24/22 0623 10/24/22 0630 10/24/22 0700 10/24/22 0730  BP: (!) 120/93 (!) 111/90 (!) 120/103 (!) 118/106  Pulse: (!) 126 (!) 133 (!) 160 (!) 125  Resp:  14  15  Temp:      TempSrc:      SpO2:  99% 96% 100%  Weight:      Height:        Constitutional: NAD, calm, comfortable Vitals:   10/24/22 0623 10/24/22 0630 10/24/22 0700 10/24/22 0730  BP: (!) 120/93 (!) 111/90 (!) 120/103 (!) 118/106  Pulse: (!) 126 (!) 133 (!) 160 (!) 125  Resp:  14  15  Temp:      TempSrc:      SpO2:  99% 96% 100%  Weight:      Height:       Eyes: PERRL, lids and conjunctivae normal ENMT: Mucous membranes are moist. Posterior pharynx clear of any exudate or lesions.Normal dentition.  Neck: normal, supple, no masses, no thyromegaly Respiratory: clear to auscultation bilaterally, no wheezing, fine crackles on bilateral lower fields, increasing respiratory effort. No accessory muscle use.  Cardiovascular: Irregular heart rate, no  murmurs / rubs / gallops.  2+ extremity edema. 2+ pedal pulses. No carotid bruits.  Abdomen: no tenderness, no masses palpated. No hepatosplenomegaly. Bowel sounds positive.  Musculoskeletal: no clubbing / cyanosis. No joint deformity upper and lower extremities. Good ROM, no contractures. Normal muscle tone.  Skin: no rashes, lesions, ulcers. No induration Neurologic: CN 2-12 grossly intact. Sensation intact, DTR normal. Strength 5/5 in all 4.  Psychiatric: Normal judgment and insight. Alert and oriented x 3. Normal mood.     Labs on Admission: I have personally reviewed following labs and imaging studies  CBC: Recent Labs  Lab 10/24/22 0513  WBC 7.2  HGB 14.3  HCT 43.5  MCV 87.9  PLT 228   Basic Metabolic Panel: Recent Labs  Lab 10/24/22 0513  NA 141  K  3.8  CL 102  CO2 26  GLUCOSE 160*  BUN 22*  CREATININE 1.20  CALCIUM 9.1   GFR: Estimated Creatinine Clearance: 73.3 mL/min (by C-G formula based on SCr of 1.2 mg/dL). Liver Function Tests: No results for input(s): "AST", "ALT", "ALKPHOS", "BILITOT", "PROT", "ALBUMIN" in the last 168 hours. No results for input(s): "LIPASE", "AMYLASE" in the last 168 hours. No results for input(s): "AMMONIA" in the last 168 hours. Coagulation Profile: Recent Labs  Lab 10/24/22 0543  INR 1.2   Cardiac Enzymes: No results for input(s): "CKTOTAL", "CKMB", "CKMBINDEX", "TROPONINI" in the last 168 hours. BNP (last 3 results) No results for input(s): "PROBNP" in the last 8760 hours. HbA1C: No results for input(s): "HGBA1C" in the last 72 hours. CBG: No results for input(s): "GLUCAP" in the last 168 hours. Lipid Profile: No results for input(s): "CHOL", "HDL", "LDLCALC", "TRIG", "CHOLHDL", "LDLDIRECT" in the last 72 hours. Thyroid Function Tests: No results for input(s): "TSH", "T4TOTAL", "FREET4", "T3FREE", "THYROIDAB" in the last 72 hours. Anemia Panel: No results for input(s): "VITAMINB12", "FOLATE", "FERRITIN", "TIBC", "IRON",  "RETICCTPCT" in the last 72 hours. Urine analysis: No results found for: "COLORURINE", "APPEARANCEUR", "LABSPEC", "PHURINE", "GLUCOSEU", "HGBUR", "BILIRUBINUR", "KETONESUR", "PROTEINUR", "UROBILINOGEN", "NITRITE", "LEUKOCYTESUR"  Radiological Exams on Admission: CT Angio Chest PE W/Cm &/Or Wo Cm  Result Date: 10/24/2022 CLINICAL DATA:  62 year old male with history of mid chest pain and shortness of breath. EXAM: CT ANGIOGRAPHY CHEST WITH CONTRAST TECHNIQUE: Multidetector CT imaging of the chest was performed using the standard protocol during bolus administration of intravenous contrast. Multiplanar CT image reconstructions and MIPs were obtained to evaluate the vascular anatomy. RADIATION DOSE REDUCTION: This exam was performed according to the departmental dose-optimization program which includes automated exposure control, adjustment of the mA and/or kV according to patient size and/or use of iterative reconstruction technique. CONTRAST:  75mL OMNIPAQUE IOHEXOL 350 MG/ML SOLN COMPARISON:  Chest CTA 09/08/2019. FINDINGS: Cardiovascular: No filling defects are noted within the pulmonary arterial tree to suggest pulmonary embolism. Heart size is enlarged with left atrial dilatation. There is no significant pericardial fluid, thickening or pericardial calcification. No atherosclerotic calcifications are noted in the thoracic aorta or the coronary arteries. Mediastinum/Nodes: Multiple prominent borderline enlarged mediastinal and bilateral hilar lymph nodes are noted, nonspecific. Mildly enlarged right paratracheal lymph node (axial image 41 of series 4) currently measuring 1.6 cm in short axis (previously 1.3 cm on 09/08/2019). Esophagus is unremarkable in appearance. No axillary lymphadenopathy. Lungs/Pleura: No acute consolidative airspace disease. No pleural effusions. No suspicious appearing pulmonary nodules or masses are noted. Upper Abdomen: Aortic atherosclerosis. Musculoskeletal: There are no  aggressive appearing lytic or blastic lesions noted in the visualized portions of the skeleton. Review of the MIP images confirms the above findings. IMPRESSION: 1. No acute findings are noted in the thorax to account for the patient's symptoms. Specifically, no evidence of pulmonary embolism. 2. Cardiomegaly with left atrial dilatation. 3. Mildly enlarged right paratracheal lymph node, only minimally increased in size compared to prior examination from 09/08/2019. This is nonspecific and favored to be benign. Follow-up contrast-enhanced chest CT is recommended in 6 months to ensure the stability or regression of this finding. Electronically Signed   By: Trudie Reed M.D.   On: 10/24/2022 06:29   DG Chest Port 1 View  Result Date: 10/24/2022 CLINICAL DATA:  61 year old male with history of chest pain. EXAM: PORTABLE CHEST 1 VIEW COMPARISON:  Chest x-ray 07/17/2021. FINDINGS: Lung volumes are normal. No consolidative airspace disease. No pleural effusions. No pneumothorax.  No evidence of pulmonary edema. Heart size is mildly enlarged. The patient is rotated to the right on today's exam, resulting in distortion of the mediastinal contours and reduced diagnostic sensitivity and specificity for mediastinal pathology. IMPRESSION: 1. No radiographic evidence of acute cardiopulmonary disease. 2. Mild cardiomegaly. Electronically Signed   By: Trudie Reed M.D.   On: 10/24/2022 05:30    EKG: Independently reviewed.  A-fib with RVR  Assessment/Plan Principal Problem:   CHF (congestive heart failure) (HCC) Active Problems:   Atrial fibrillation with RVR (HCC)   Acute on chronic combined systolic (congestive) and diastolic (congestive) heart failure (HCC)  (please populate well all problems here in Problem List. (For example, if patient is on BP meds at home and you resume or decide to hold them, it is a problem that needs to be her. Same for CAD, COPD, HLD and so on)  Acute on chronic HFrEF  decompensation -Secondary to medication noncompliance, consult case management for insurance/medication help -1 dose of digoxin 0.25 mg given -As needed metoprolol 5 mg every 4 hours as needed for breakthrough heart rate, resume home dose of metoprolol to tartrate off as needed metoprolol -Clinically still has significant fluid overload, continue IV diuresis Lasix 40 mg twice daily -ED start patient on heparin drip, expect to switch back to oral anticoagulation in 24 hours -Echocardiogram -Repeat x-ray tomorrow  A-fib with RVR -As above -Repeat EKG tomorrow  History of cocaine abuse -Denied recent usage, will check UDS  Alcohol abuse -No symptoms or signs of active withdrawal, start CIWA protocol with as needed benzos  History of PE -As above  DVT prophylaxis: Heparin drip Code Status: Full code Family Communication: None at bedside Disposition Plan: Patient is sick with CHF decompensation requiring IV medications, expect more than 2 midnight hospital stay for IV diuresis Consults called: None Admission status: PCU   Emeline General MD [facility] Pager [phone]  10/24/2022, 7:56 AM

## 2022-10-24 NOTE — ED Triage Notes (Signed)
Patient ambulatory to triage with slow, steady gait, frequently clearing throat; pt c/o mid CP, nonradiating accomp by [facility]; denies cough; st hx CHF and takes lasix PRN

## 2022-10-24 NOTE — ED Provider Notes (Addendum)
[facility] Provider Note    Event Date/Time   First MD Initiated Contact with Patient 10/24/22 [phone]     (approximate)   History   Chest Pain   HPI  Calvin Esparza is a 62 y.o. male   Past medical history of atrial fibrillation, PE, substance use, hypertension, hyperlipidemia, CHF with ejection fraction of 40%, who presents to the emergency department with exertional dyspnea over the past several months and chest pain starting today.  He feels he has gained water weight as well and has been taking increased doses of his diuretic at home, and despite this has gained approximately 5 to 7 pounds over the last 2 days.  Of note, he has stopped taking his anticoagulation and rate control medications since January because he did not have insurance or access to refills.  He denies any unilateral leg pain or swelling.  Denies any respiratory infectious symptoms like cough or fever.  He denies drug or alcohol use   External Medical Documents Reviewed: Chart summary from May 2023 for heart failure exacerbation      Physical Exam   Triage Vital Signs: ED Triage Vitals  Encounter Vitals Group     BP 10/24/22 0509 (!) 140/108     Systolic BP Percentile --      Diastolic BP Percentile --      Pulse Rate 10/24/22 0509 (!) 187     Resp 10/24/22 0509 20     Temp --      Temp src --      SpO2 10/24/22 0509 99 %     Weight 10/24/22 0502 195 lb (88.5 kg)     Height 10/24/22 0502 5\' 10"  (1.778 m)     Head Circumference --      Peak Flow --      Pain Score 10/24/22 0502 8     Pain Loc --      Pain Education --      Exclude from Growth Chart --     Most recent vital signs: Vitals:   10/24/22 0600 10/24/22 0623  BP: (!) 113/97 (!) 120/93  Pulse: (!) 126 (!) 126  Resp: (!) 27   Temp:    SpO2: 98%     General: Awake, no distress.  CV:  Good peripheral perfusion.  Resp:  Normal effort. Abd:  No distention.  Other:  Heart rate irregular and  tachycardic anywhere from 150s to 180s, normotensive/hypertensive and the patient is awake alert comfortable appearing cooperative pleasant.  He has some mild bilateral lower extremity edema, no obvious focalities to lung exam, and bedside ultrasound shows poor ejection fraction and B-lines in bilateral lung fields without obvious right heart strain patterns.   ED Results / Procedures / Treatments   Labs (all labs ordered are listed, but only abnormal results are displayed) Labs Reviewed  BASIC METABOLIC PANEL - Abnormal; Notable for the following components:      Result Value   Glucose, Bld 160 (*)    BUN 22 (*)    All other components within normal limits  CBC - Abnormal; Notable for the following components:   RDW 16.9 (*)    All other components within normal limits  BRAIN NATRIURETIC PEPTIDE - Abnormal; Notable for the following components:   B Natriuretic Peptide 910.5 (*)    All other components within normal limits  HEPARIN LEVEL (UNFRACTIONATED) - Abnormal; Notable for the following components:   Heparin Unfractionated <0.10 (*)  All other components within normal limits  APTT  PROTIME-INR  HEPARIN LEVEL (UNFRACTIONATED)  TROPONIN I (HIGH SENSITIVITY)  TROPONIN I (HIGH SENSITIVITY)     I ordered and reviewed the above labs they are notable for normal H&H and white blood cell count.  EKG  ED ECG REPORT I, Pilar Jarvis, the attending physician, personally viewed and interpreted this ECG.   Date: 10/24/2022  EKG Time: 0510  Rate: 166  Rhythm: af rvr  Axis: nl  Intervals: none  ST&T Change: no acute ischemic changes    RADIOLOGY I independently reviewed and interpreted chest x-ray and see no obvious focalities or pneumothorax I also reviewed radiologist's formal read.   PROCEDURES:  Critical Care performed: Yes, see critical care procedure note(s)  Procedures   MEDICATIONS ORDERED IN ED: Medications  heparin ADULT infusion 100 units/mL (25000  units/233mL) (1,350 Units/hr Intravenous New Bag/Given 10/24/22 0556)  furosemide (LASIX) injection 40 mg (40 mg Intravenous Given 10/24/22 0531)  metoprolol tartrate (LOPRESSOR) injection 5 mg (5 mg Intravenous Given 10/24/22 0545)  heparin bolus via infusion 5,000 Units (5,000 Units Intravenous Bolus from Bag 10/24/22 0557)  iohexol (OMNIPAQUE) 350 MG/ML injection 75 mL (75 mLs Intravenous Contrast Given 10/24/22 0610)  metoprolol tartrate (LOPRESSOR) tablet 50 mg (50 mg Oral Given 10/24/22 1610)    External physician / consultants:  I spoke with hospitalist for admit and  regarding care plan for this patient.   IMPRESSION / MDM / ASSESSMENT AND PLAN / ED COURSE  I reviewed the triage vital signs and the nursing notes.                                Patient's presentation is most consistent with acute presentation with potential threat to life or bodily function.  Differential diagnosis includes, but is not limited to, atrial fibrillation with RVR, PE, ACS, CHF exacerbation   The patient is on the cardiac monitor to evaluate for evidence of arrhythmia and/or significant heart rate changes.  MDM:    Patient with history of A-fib and PE but not taking his medications for several months now.  He presents in atrial fibrillation with RVR along with some chest pressure and exertional dyspnea.  He also has evidence of CHF exacerbation with exertional dyspnea, peripheral edema, and weight gain with B-lines on ultrasound.  His RVR may be due to a combination of CHF exacerbation and medication nonadherence.  Will check for ACS with serial troponins given his chest pressure as well.  Concern for PE given his noncompliance with anticoagulation history of PE and atrial fibrillation.  Check CT angiogram.  For rate control will give metoprolol in place of his diltiazem given history of systolic heart failure.  For fluid overload, diuresis initiated.  Given he has been off of anticoagulation and is in A-fib  with a history of PE, will start heparin.  After initial workup and stabilization in the emergency department he will require admission.   --- Rate improved with 3 doses of IV metoprolol now in the 110s to 120s, remains normotensive.  Ordered for 50 mg p.o. metoprolol.  Diuresis initiated.  CT angiogram pending.  Heparin started.  HR unchanged, normotensive. CTA neg for PE. I anticipate further HR improvement w diuresis and PO metoprolol given. If RVR recurs or worsens would consider amiodarone/cardiology consult while inpt.       FINAL CLINICAL IMPRESSION(S) / ED DIAGNOSES   Final diagnoses:  Atrial fibrillation  with RVR (HCC)  Nonadherence to medication  Exertional dyspnea  Nonspecific chest pain  Acute on chronic congestive heart failure, unspecified heart failure type (HCC)     Rx / DC Orders   ED Discharge Orders     None        Note:  This document was prepared using Dragon voice recognition software and may include unintentional dictation errors.    Pilar Jarvis, MD 10/24/22 Wilber Oliphant    Pilar Jarvis, MD 10/24/22 9811    Pilar Jarvis, MD 10/24/22 [phone]

## 2022-10-24 NOTE — ED Notes (Signed)
Pt reports hx of afib. Has not taken any prescribed medications since January

## 2022-10-24 NOTE — Progress Notes (Signed)
ANTICOAGULATION CONSULT NOTE  Pharmacy Consult for heparin infusion Indication: atrial fibrillation  No Known Allergies  Patient Measurements: Height: 5\' 10"  (177.8 cm) Weight: 88.5 kg (195 lb) IBW/kg (Calculated) : 73 Heparin Dosing Weight: 88.5 kg  Vital Signs: Temp: 97.8 F (36.6 C) (08/09 0513) Temp Source: Oral (08/09 0513) BP: 140/108 (08/09 0509) Pulse Rate: 187 (08/09 0509)  Labs: Recent Labs    10/24/22 0513  HGB 14.3  HCT 43.5  PLT 228    CrCl cannot be calculated (Patient's most recent lab result is older than the maximum 21 days allowed.).   Medical History: Past Medical History:  Diagnosis Date   Alcohol abuse    Chronic pain    COVID-19    a. 02/2019   HFrEF (heart failure with reduced ejection fraction) (HCC)    a. 05/2019 Echo: EF 35-40%.   History of medication noncompliance    Longstanding persistent atrial fibrillation (HCC)    a. Dx 03/2018; b. 05/2019 recurrent Afib in setting of PE. CHA2DS2VASc = 1-->Eliquis later changed to xarelto; b. 07/2019 s/p DCCV (150J); d. 08/2019 admit w/ AF RVR in settting of noncompliance - longstanding persistent afib since on bb/ccb rx.   NICM (nonischemic cardiomyopathy) (HCC)    a. 05/2019 Echo: EF 35-40%, gr1 DD. Mod enlarged RV. Mildly dil LA. Mild to mod dil RA. Triv AI; b. 05/2019 MV: EF 32%, no ischemia.   Pulmonary embolism (HCC)    a. 05/2019 CTA Chest: mild amt of PE w/in a lower lobe branch of RPA; b. 08/2019 CTA Chest: Small PE in RML and RLL PA.    Medications:  PTA Meds: Xarelto 20 mg daily  Assessment: Pt is a 62 yo male with h/o A fib on Xarelto presenting to ED c/o mid CP and SOB.  Pt states "he has stopped taking his anticoagulation and rate control medications since January because he did not have insurance or access to refills.   Goal of Therapy:  Heparin level 0.3-0.7 units/ml Monitor platelets by anticoagulation protocol: Yes   Plan:  Bolus 5000 units x 1 Start heparin infusion at 1350  units/hr Will check HL in 6 hrs after start of infusion. CBC daily while on heparin  Otelia Sergeant, PharmD, [facility] 10/24/2022 5:39 AM

## 2022-10-24 NOTE — Progress Notes (Signed)
Transition of Care [facility]) - Inpatient Brief Assessment   Patient Details  Name: Calvin Esparza MRN: [id number] Date of Birth: 1961/03/10  [facility] CM/SW Contact:    Truddie Hidden, RN Phone Number: 10/24/2022, 3:41 PM   Clinical Narrative: TOC continuing ongoing assessments for needs that may arise and changes in discharge plan.   Transition of Care Asessment: Insurance and Status: Insurance coverage has been reviewed Patient has primary care physician: No Home environment has been reviewed: Return home Prior level of function:: independent Prior/Current Home Services: No current home services Social Determinants of Health Reivew: SDOH reviewed no interventions necessary Readmission risk has been reviewed: Yes Transition of care needs: no transition of care needs at this time

## 2022-10-24 NOTE — Consult Note (Addendum)
ANTICOAGULATION CONSULT NOTE -  Pharmacy Consult for Heparin Indication: atrial fibrillation  No Known Allergies  Patient Measurements: Height: 5\' 10"  (177.8 cm) Weight: 88.5 kg (195 lb) IBW/kg (Calculated) : 73 Heparin Dosing Weight: 88.5 kg  Vital Signs: Temp: 97.8 F (36.6 C) (08/09 0930) Temp Source: Oral (08/09 0930) BP: 116/95 (08/09 0930) Pulse Rate: 123 (08/09 0930)  Labs: Recent Labs    10/24/22 0513 10/24/22 0543 10/24/22 0732 10/24/22 1153  HGB 14.3  --   --   --   HCT 43.5  --   --   --   PLT 228  --   --   --   APTT  --  27  --   --   LABPROT  --  15.1  --   --   INR  --  1.2  --   --   HEPARINUNFRC  --  <0.10*  --  0.44  CREATININE 1.20  --   --   --   TROPONINIHS 15  --  14  --     Estimated Creatinine Clearance: 73.3 mL/min (by C-G formula based on SCr of 1.2 mg/dL).   Medical History: Past Medical History:  Diagnosis Date   Alcohol abuse    Chronic pain    COVID-19    a. 02/2019   HFrEF (heart failure with reduced ejection fraction) (HCC)    a. 05/2019 Echo: EF 35-40%.   History of medication noncompliance    Longstanding persistent atrial fibrillation (HCC)    a. Dx 03/2018; b. 05/2019 recurrent Afib in setting of PE. CHA2DS2VASc = 1-->Eliquis later changed to xarelto; b. 07/2019 s/p DCCV (150J); d. 08/2019 admit w/ AF RVR in settting of noncompliance - longstanding persistent afib since on bb/ccb rx.   NICM (nonischemic cardiomyopathy) (HCC)    a. 05/2019 Echo: EF 35-40%, gr1 DD. Mod enlarged RV. Mildly dil LA. Mild to mod dil RA. Triv AI; b. 05/2019 MV: EF 32%, no ischemia.   Pulmonary embolism (HCC)    a. 05/2019 CTA Chest: mild amt of PE w/in a lower lobe branch of RPA; b. 08/2019 CTA Chest: Small PE in RML and RLL PA.    Medications:  Patient hasn't been taking Rivaroxaban since January due to insurance reasons  Also not on any rate control medications for same reason (Digoxin and Metoprolol)  Assessment: Calvin Esparza is a 62 yo old male  with a history of afib was on rivaroxaban PTA with RVR and PE not taking any of his medications since January of 2024 due to lost of insurance. Presented to the ED with a CHF exacerbation, dyspnea, peripheral edema and weight gain. Pharmacy consulted for heparin due to afib with history of PE. Baseline heparin level was < 0.1, so we will monitor heparin using heparin level. CHADSVASC score: 4  8/9 1153 HL 0.44.   Goal of Therapy:  Heparin level 0.3-0.7 units/ml Monitor platelets by anticoagulation protocol: Yes   Plan:  Heparin level is therapeutic. Will continue heparin at 1350 units/hr. Recheck heparin level in 6 hours. CBC daily while on heparin.    Effie Shy 10/24/2022,12:19 PM

## 2022-10-24 NOTE — Progress Notes (Signed)
*  PRELIMINARY RESULTS* Echocardiogram 2D Echocardiogram has been performed.  Carolyne Fiscal 10/24/2022, 1:41 PM

## 2022-10-24 NOTE — TOC Benefit Eligibility Note (Signed)
Patient Product/process development scientist completed.    The patient is insured through [employer]. Patient has [employer], may use a copay card, and/or apply for patient assistance if available.    Ran test claim for Entresto 24-26 mg and the current 30 day co-pay is $656.56 due to a $7500 deductible.  Ran test claim for Xarelto 20 mg and the current 30 day co-pay is $543.58 due to a $7500 deductible.  Ran test claim for Jardiance 10 mg and Requires Prior Authorizatoin  Ran test claim for Farxiga 10 mg and Non Formulary   This test claim was processed through [employer]- copay amounts may vary at other pharmacies due to [employer], or as the patient moves through the different stages of their insurance plan.     Roland Earl, CPHT Pharmacy Patient Advocate Specialist [facility] Patient Advocate Team Direct Number: [phone]  Fax: [phone]

## 2022-10-24 NOTE — Consult Note (Signed)
ANTICOAGULATION CONSULT NOTE  Pharmacy Consult for Heparin Infusion Indication: atrial fibrillation  No Known Allergies  Patient Measurements: Height: 5\' 10"  (177.8 cm) Weight: 88.5 kg (195 lb) IBW/kg (Calculated) : 73 Heparin Dosing Weight: 88.5 kg  Vital Signs: Temp: 97.8 F (36.6 C) (08/09 0930) Temp Source: Oral (08/09 0930) BP: 116/95 (08/09 0930) Pulse Rate: 123 (08/09 0930)  Labs: Recent Labs    10/24/22 0513 10/24/22 0543 10/24/22 0732 10/24/22 1008 10/24/22 1153  HGB 14.3  --   --   --   --   HCT 43.5  --   --   --   --   PLT 228  --   --   --   --   APTT  --  27  --   --   --   LABPROT  --  15.1  --   --   --   INR  --  1.2  --   --   --   HEPARINUNFRC  --  <0.10*  --  0.52 0.44  CREATININE 1.20  --   --   --   --   TROPONINIHS 15  --  14  --   --     Estimated Creatinine Clearance: 73.3 mL/min (by C-G formula based on SCr of 1.2 mg/dL).   Medical History: Past Medical History:  Diagnosis Date   Alcohol abuse    Chronic pain    COVID-19    a. 02/2019   HFrEF (heart failure with reduced ejection fraction) (HCC)    a. 05/2019 Echo: EF 35-40%.   History of medication noncompliance    Longstanding persistent atrial fibrillation (HCC)    a. Dx 03/2018; b. 05/2019 recurrent Afib in setting of PE. CHA2DS2VASc = 1-->Eliquis later changed to xarelto; b. 07/2019 s/p DCCV (150J); d. 08/2019 admit w/ AF RVR in settting of noncompliance - longstanding persistent afib since on bb/ccb rx.   NICM (nonischemic cardiomyopathy) (HCC)    a. 05/2019 Echo: EF 35-40%, gr1 DD. Mod enlarged RV. Mildly dil LA. Mild to mod dil RA. Triv AI; b. 05/2019 MV: EF 32%, no ischemia.   Pulmonary embolism (HCC)    a. 05/2019 CTA Chest: mild amt of PE w/in a lower lobe branch of RPA; b. 08/2019 CTA Chest: Small PE in RML and RLL PA.    Medications:  Patient hasn't been taking Rivaroxaban since January due to insurance reasons  Also not on any rate control medications for same reason (Digoxin  and Metoprolol)  Assessment: Calvin Esparza is a 62 yo old male with a history of afib was on rivaroxaban PTA with RVR and PE not taking any of his medications since January of 2024 due to loss of insurance. Presented to the ED with a CHF exacerbation, dyspnea, peripheral edema and weight gain. Baseline heparin level was < 0.1, so we will monitor heparin using heparin level. CHADS-VASc: 4. Pharmacy consulted for heparin due to afib with history of PE.   Baseline Labs: aPTT 27, HL <0.10, PT 15.1, INR 1.2, Hgb 14.3, Hct 43.5, Plt 228   Goal of Therapy:  Heparin level 0.3-0.7 units/ml Monitor platelets by anticoagulation protocol: Yes   Date Time HL Rate/Comment  8/9 1153 0.44 1350/therapeutic x1 8/9 1808 0.52 1350/therapeutic x2   Plan:  Continue heparin infusion at 1350 units/hr Check HL daily while on heparin infusion Continue to monitor H&H and platelets daily while on heparin infusion   Celene Squibb, PharmD Clinical Pharmacist 10/24/2022 7:00 PM

## 2022-10-24 NOTE — Progress Notes (Signed)
[facility] HF Stewardship  PCP: Lauro Regulus, MD  PCP-Cardiologist: Julien Nordmann, MD  HPI: Calvin Esparza is a 62 y.o. male with CHF, HTN, HLD, gout, PE and PAF on Xarelto, alcohol abuse, presented with worsening of fatigue, exertional dyspnea, and increasing leg swelling. Echo in 2021 showed LVEF of 35-40%. LVEF increased to 40-45% in 07/2021. New echo pending. Patient has been off HF medications since January due to cost and experienced progression of symptoms starting in June. BNP 910 on admission.   Pertinent Lab Values: BUN  Date Value Ref Range Status  10/24/2022 22 (H) 6 - 20 mg/dL Final  08/65/7846 22 6 - 24 mg/dL Final   Potassium  Date Value Ref Range Status  10/24/2022 3.8 3.5 - 5.1 mmol/L Final   Sodium  Date Value Ref Range Status  10/24/2022 141 135 - 145 mmol/L Final  12/03/2020 140 134 - 144 mmol/L Final   B Natriuretic Peptide  Date Value Ref Range Status  10/24/2022 910.5 (H) 0.0 - 100.0 pg/mL Final    Comment:    Performed at [facility], [address] [place], [place] [address]   Magnesium  Date Value Ref Range Status  10/08/2021 2.4 1.7 - 2.4 mg/dL Final    Comment:    Performed at [facility], [address] [place], [place] [address]   TSH  Date Value Ref Range Status  02/25/2020 1.449 0.350 - 4.500 uIU/mL Final    Comment:    Performed by a 3rd Generation assay with a functional sensitivity of <=0.01 uIU/mL. Performed at [facility], [address] [place], [place] [address]     Vital Signs: Temp:  [97.8 F (36.6 C)] 97.8 F (36.6 C) (08/09 0930) Pulse Rate:  [123-187] 123 (08/09 0930) Cardiac Rhythm: Atrial fibrillation (08/09 1019) Resp:  [14-27] 18 (08/09 0930) BP: (94-149)/(73-108) 116/95 (08/09 0930) SpO2:  [96 %-100 %] 97 % (08/09 0930) Weight:  [88.5 kg (195 lb)] 88.5 kg (195 lb) (08/09 0502)   Intake/Output Summary (Last 24 hours) at 10/24/2022 1211 Last data filed at 10/24/2022  0908 Gross per 24 hour  Intake --  Output 1350 ml  Net -1350 ml    Current HF Medications:  Metoprolol tartrate 50 mg BID Farxiga 10 mg daily Digoxin 0.125 mg daily Furosemide 40 mg BID  Prior to admission HF Medications:  none  Assessment: 1. Acute on chronic systolic heart failure (LVEF in 2023 40-45%), due to NICM. NYHA class III-IV symptoms.  -Significant respiratory symptoms with orthopnea. SBP ~120, HR 120s 2/2 Afib. Metoprolol and digoxin added for HF and Afib RVR. -Wt at home ~195, none measured today. No LEE on exam. Agree with IV Lasix. Renal function at baseline.  -K 3.8, can consider adding spironolactone first to maintain K >4.   Plan: 1) Medication changes recommended at this time: -Consider spironolactone 12.5 mg daily on  8/10 if BP stable. -Consider Kcl 40 meq x 1 today  2) Patient assistance: -Patient qualifies for copay cards with commercial insurance.  -Deductible is $7,500, therefore copay cards are essential before discharge. Entresto copay cards help cover deductible. Sherryll Burger copay: $656.56 -Jardiance requires prior authorization -Xarelto copay: $543.58   3) Education: -To be completed prior to discharge.  Medication Assistance / Insurance Benefits Check:  Does the patient have prescription insurance? Prescription Insurance: Commercial ([employer])  Does the patient qualify for medication assistance through manufacturers or grants? Pending   Eligible grants and/or patient assistance programs: pending   Medication  assistance applications in progress: pending   Medication assistance applications approved: pending  Outpatient Pharmacy:  Prior to admission outpatient pharmacy: none  Is the patient willing to use [facility] pharmacy at discharge? Yes  Is the patient willing to transition their outpatient pharmacy to utilize a [facility] outpatient pharmacy?   TBD  Thank you for involving pharmacy in this patient's  care.  Enos Fling, PharmD, BCPS Clinical Pharmacist 10/24/2022 12:13 PM

## 2022-10-25 ENCOUNTER — Encounter: Payer: Self-pay | Admitting: Internal Medicine

## 2022-10-25 ENCOUNTER — Inpatient Hospital Stay: Payer: 59

## 2022-10-25 DIAGNOSIS — I4821 Permanent atrial fibrillation: Secondary | ICD-10-CM | POA: Diagnosis not present

## 2022-10-25 DIAGNOSIS — I5043 Acute on chronic combined systolic (congestive) and diastolic (congestive) heart failure: Secondary | ICD-10-CM | POA: Diagnosis not present

## 2022-10-25 DIAGNOSIS — I509 Heart failure, unspecified: Secondary | ICD-10-CM

## 2022-10-25 DIAGNOSIS — I4891 Unspecified atrial fibrillation: Secondary | ICD-10-CM

## 2022-10-25 LAB — GLUCOSE, CAPILLARY
Glucose-Capillary: 94 mg/dL (ref 70–99)
Glucose-Capillary: 106 mg/dL — ABNORMAL HIGH (ref 70–99)

## 2022-10-25 MED ORDER — ASPIRIN 81 MG PO CHEW: 81.0000 mg | CHEWABLE_TABLET | ORAL | Status: DC

## 2022-10-25 MED ORDER — POTASSIUM CHLORIDE CRYS ER 20 MEQ PO TBCR
40.0000 meq | EXTENDED_RELEASE_TABLET | Freq: Once | ORAL | Status: AC
  Administered 2022-10-25: 40 meq via ORAL
  Filled 2022-10-25: qty 2

## 2022-10-25 MED ORDER — ASPIRIN 81 MG PO CHEW
81.0000 mg | CHEWABLE_TABLET | ORAL | Status: AC
  Administered 2022-10-27: 81 mg via ORAL
  Filled 2022-10-25: qty 1

## 2022-10-25 MED ORDER — LOSARTAN POTASSIUM 25 MG PO TABS
12.5000 mg | ORAL_TABLET | Freq: Every day | ORAL | Status: DC
  Administered 2022-10-25 – 2022-10-26 (×2): 12.5 mg via ORAL
  Filled 2022-10-25 (×3): qty 1
  Filled 2022-10-25: qty 0.5

## 2022-10-25 NOTE — Plan of Care (Signed)

## 2022-10-25 NOTE — TOC CM/SW Note (Signed)
TOC consult received for SA resources. Resources have been added to be included on the AVS.   , LCSW Transitions of Care Department [phone]

## 2022-10-25 NOTE — Consult Note (Signed)
Cardiology Consult    Patient ID: Calvin Esparza MRN: [id number], DOB/AGE: 1961-03-11   Admit date: 10/24/2022 Date of Consult: 10/25/2022  Primary Physician: Lauro Regulus, MD Primary Cardiologist: Julien Nordmann, MD Requesting Provider: Dorris Carnes. Lyn Hollingshead, DO  Patient Profile    Calvin Esparza is a 62 y.o. male with a history of NICM, chronic HFrEF, permanent Afib, PE in 2021, noncompliance, ETOH abuse, gout, and chronic pain who is being seen today for the evaluation of recurrent CHF at the request of Dr. Lyn Hollingshead.  Past Medical History   Past Medical History:  Diagnosis Date   Alcohol abuse    Chronic pain    COVID-19    a. 02/2019   HFrEF (heart failure with reduced ejection fraction) (HCC)    a. 05/2019 Echo: EF 35-40%; b. 07/2021 Echo: EF 40-45%; c. 10/2022 Echo: EF 20-25%, glob HK, mildly reduced RV fxn, sev dil LA, mid-mod MR, mild AI.   History of medication noncompliance    Longstanding persistent atrial fibrillation (HCC)    a. Dx 03/2018; b. 05/2019 recurrent Afib in setting of PE. CHA2DS2VASc = 1-->Eliquis later changed to xarelto; b. 07/2019 s/p DCCV (150J); d. 08/2019 admit w/ AF RVR in settting of noncompliance - longstanding persistent afib since on bb/ccb rx.   NICM (nonischemic cardiomyopathy) (HCC)    a. 05/2019 Echo: EF 35-40%; b. 05/2019 MV: EF 32%, no ischemia; c. 07/2021 Echo: EF 40-45%; d. 10/2022 Echo: EF 20-25%, glob HK.   Pulmonary embolism (HCC)    a. 05/2019 CTA Chest: mild amt of PE w/in a lower lobe branch of RPA; b. 08/2019 CTA Chest: Small PE in RML and RLL PA.    Past Surgical History:  Procedure Laterality Date   CARDIOVERSION N/A 07/29/2019   Procedure: CARDIOVERSION;  Surgeon: Antonieta Iba, MD;  Location: [facility] ORS;  Service: Cardiovascular;  Laterality: N/A;   COLONOSCOPY WITH PROPOFOL N/A 05/24/2020   Procedure: COLONOSCOPY WITH PROPOFOL;  Surgeon: Toney Reil, MD;  Location: [facility] ENDOSCOPY;  Service: Gastroenterology;  Laterality:  N/A;   ESOPHAGOGASTRODUODENOSCOPY (EGD) WITH PROPOFOL N/A 02/27/2020   Procedure: ESOPHAGOGASTRODUODENOSCOPY (EGD) WITH PROPOFOL;  Surgeon: Toney Reil, MD;  Location: [facility] ENDOSCOPY;  Service: Gastroenterology;  Laterality: N/A;   ESOPHAGOGASTRODUODENOSCOPY (EGD) WITH PROPOFOL N/A 05/24/2020   Procedure: ESOPHAGOGASTRODUODENOSCOPY (EGD) WITH PROPOFOL;  Surgeon: Toney Reil, MD;  Location: [facility] ENDOSCOPY;  Service: Gastroenterology;  Laterality: N/A;     Allergies  No Known Allergies  History of Present Illness    62 y.o. male with a history of NICM, chronic HFrEF, permanent Afib, PE in 2021, noncompliance, ETOH abuse, gout, and chronic pain.  He was initially dx w/ Afib in 03/2018.  He was intermittently compliant w/ meds, which delayed mgmt.  In 05/2019, he was admitted w/ PE and Afib w/ RVR.  EF 35-40% at that time.  F/u stress testing was non-ischemic. He was d/c'd on anticoagulation and subsequently underwent DCCV in 07/2019 but was readmitted w/ recurrent PE and afib in the setting of med noncompliance in 08/2019.  Afib has since been managed conservatively.  F/u echo in 07/2021 showed sl improvement in EF to 40-45%.  He was last seen in cardiology clinic in 09/2021, at which time he was felt to be euvolemic.  He was maintained on ? blocker, ccb, digoxin, and xarelto.  Unfortunately, [name] stopped taking most of his medications in January 2024.  He notes that though he has insurance, he had to jump through some hoops  to get Xarelto and he just got tired of it.  He ran out of his metoprolol, diltiazem, and digoxin, and simply did not refill.  He has not been using drugs or alcohol.  He still has torsemide at home.  He was doing well until about a month to a month and a half ago, when he started noticing increasing abdominal girth with progressive dyspnea on exertion.  He has been taking torsemide at least once every other day and on some occasions, twice a day.  Over the past week,  despite using torsemide, he has had progressive dyspnea, orthopnea, and increasing abdominal girth.  He has also been experiencing exertional chest tightness, especially while working outside in the heat.  As a result of progressive symptoms, he presented to the emergency department August 9.   On arrival, he was hypertensive and tachycardic.  BP 140/108.  First recorded HR of 189, though 12 lead shows Afib @ 129 w/ nonspecific T changes (no acute changes).  Labs notable for BNP of 910.5. HsTrop nl @ 14.  CXR w/ cardiomegaly and vasc congestion.  CTA chest neg for PE.  He was treated with intravenous Lasix, heparin, and beta-blocker, and admitted for further evaluation.  F/u CXR 8/10 w/ ongoing cardiomegaly and mild central vascular prominence, not significantly changed from 8/9 study.  Echo performed follow admission showed an EF of 20 to 25% with global hypokinesis, reduced RV function, severely dilated left atrium, and mild to moderate MR with mild AI.  This morning, he denies chest pain.  He is not experiencing dyspnea at this time, though notes if he lies flat, he becomes more uncomfortable and dyspneic.  Inpatient Medications     dapagliflozin propanediol  10 mg Oral QAC breakfast   digoxin  0.125 mg Oral Daily   folic acid  1 mg Oral Daily   furosemide  40 mg Intravenous Q12H   metoprolol tartrate  50 mg Oral BID   multivitamin with minerals  1 tablet Oral Daily   sodium chloride flush  3 mL Intravenous Q12H   thiamine  100 mg Oral Daily   Or   thiamine  100 mg Intravenous Daily    Family History    Family History  Problem Relation Age of Onset   Bone cancer Father    Stroke Father    Diabetes Sister    He indicated that his mother is alive. He indicated that his father is deceased. He indicated that both of his sisters are alive.   Social History    Social History   Socioeconomic History   Marital status: Married    Spouse name: Not on file   Number of children: Not on file    Years of education: Not on file   Highest education level: Not on file  Occupational History   Not on file  Tobacco Use   Smoking status: Never   Smokeless tobacco: Current    Types: Chew  Vaping Use   Vaping status: Never Used  Substance and Sexual Activity   Alcohol use: Yes    Alcohol/week: 7.0 standard drinks of alcohol    Types: 7 Cans of beer per week    Comment: He states he hasn't drank in the past 2 weeks but has been a heavy drinker in the past   Drug use: Not Currently   Sexual activity: Not on file  Other Topics Concern   Not on file  Social History Narrative   Lives locally w/ wife.  Does not routinely exercise.  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   Social Determinants of Health   Financial Resource Strain: Not on file  Food Insecurity: No Food Insecurity (10/24/2022)   Hunger Vital Sign    Worried About Running Out of Food in the Last Year: Never true    Ran Out of Food in the Last Year: Never true  Transportation Needs: No Transportation Needs (10/24/2022)   PRAPARE - Administrator, Civil Service (Medical): No    Lack of Transportation (Non-Medical): No  Physical Activity: Not on file  Stress: Not on file  Social Connections: Not on file  Intimate Partner Violence: Not At Risk (10/24/2022)   Humiliation, Afraid, Rape, and Kick questionnaire    Fear of Current or Ex-Partner: No    Emotionally Abused: No    Physically Abused: No    Sexually Abused: No     Review of Systems    General:  No chills, fever, night sweats or weight changes.  Cardiovascular:  +++ chest pain, +++ dyspnea on exertion, +++ increase in abdominal girth with occasional lower extremity edema, +++ orthopnea, no palpitations, paroxysmal nocturnal dyspnea. Dermatological: No rash, lesions/masses Respiratory: No cough, +++ dyspnea Urologic: No hematuria, dysuria Abdominal:   +++ Increase in abdominal girth.  No nausea, vomiting, diarrhea, bright red blood per rectum, melena,  or hematemesis Neurologic:  No visual changes, wkns, changes in mental status. All other systems reviewed and are otherwise negative except as noted above.  Physical Exam    Blood pressure 112/87, pulse (!) 115, temperature 98.3 F (36.8 C), resp. rate 18, height 5\' 10"  (1.778 m), weight 87.2 kg, SpO2 97%.  General: Pleasant, NAD Psych: Normal affect. Neuro: Alert and oriented X 3. Moves all extremities spontaneously. HEENT: Normal  Neck: Supple without bruits.  Moderately elevated JVD. Lungs:  Resp regular and unlabored, CTA. Heart: Irregularly irregular, no s3, s4, or murmurs. Abdomen: Soft, non-tender, non-distended, BS + x 4.  Extremities: No clubbing, cyanosis or edema. DP/PT2+, Radials 2+ and equal bilaterally.  Labs    Cardiac Enzymes Recent Labs  Lab 10/24/22 0513 10/24/22 0732  TROPONINIHS 15 14     BNP    Component Value Date/Time   BNP 910.5 (H) 10/24/2022 0513    Lab Results  Component Value Date   WBC 5.1 10/25/2022   HGB 13.5 10/25/2022   HCT 40.8 10/25/2022   MCV 86.4 10/25/2022   PLT 179 10/25/2022    Recent Labs  Lab 10/25/22 0539  NA 137  K 3.3*  CL 99  CO2 26  BUN 20  CREATININE 1.04  CALCIUM 8.6*  GLUCOSE 99   Lab Results  Component Value Date   CHOL 209 (H) 07/10/2020   HDL 57 07/10/2020   LDLCALC 133 (H) 07/10/2020   TRIG 107 07/10/2020     Radiology Studies    DG Chest 1 View  Result Date: 10/25/2022 CLINICAL DATA:  [address] with CHF. EXAM: CHEST  1 VIEW COMPARISON:  CTA chest yesterday at 6:15 a.m., portable chest yesterday at 5:08 a.m. FINDINGS: 7:01 a.m. Moderate cardiomegaly. There continues to be mild central vascular prominence but no overt edema. The lungs are clear of infiltrates. There is no substantial pleural effusion. The mediastinum is stable with lipomatosis and mild aortic tortuosity and atherosclerosis. No new osseous abnormality. Thoracic spondylosis. Overlying monitor wires. IMPRESSION: Cardiomegaly with mild  central vascular prominence but no overt edema or evidence of pneumonia. No significant change from yesterday's study. Electronically  Signed   By: Almira Bar M.D.   On: 10/25/2022 07:46   CT Angio Chest PE W/Cm &/Or Wo Cm  Result Date: 10/24/2022 CLINICAL DATA:  62 year old male with history of mid chest pain and shortness of breath. EXAM: CT ANGIOGRAPHY CHEST WITH CONTRAST TECHNIQUE: Multidetector CT imaging of the chest was performed using the standard protocol during bolus administration of intravenous contrast. Multiplanar CT image reconstructions and MIPs were obtained to evaluate the vascular anatomy. RADIATION DOSE REDUCTION: This exam was performed according to the departmental dose-optimization program which includes automated exposure control, adjustment of the mA and/or kV according to patient size and/or use of iterative reconstruction technique. CONTRAST:  75mL OMNIPAQUE IOHEXOL 350 MG/ML SOLN COMPARISON:  Chest CTA 09/08/2019. FINDINGS: Cardiovascular: No filling defects are noted within the pulmonary arterial tree to suggest pulmonary embolism. Heart size is enlarged with left atrial dilatation. There is no significant pericardial fluid, thickening or pericardial calcification. No atherosclerotic calcifications are noted in the thoracic aorta or the coronary arteries. Mediastinum/Nodes: Multiple prominent borderline enlarged mediastinal and bilateral hilar lymph nodes are noted, nonspecific. Mildly enlarged right paratracheal lymph node (axial image 41 of series 4) currently measuring 1.6 cm in short axis (previously 1.3 cm on 09/08/2019). Esophagus is unremarkable in appearance. No axillary lymphadenopathy. Lungs/Pleura: No acute consolidative airspace disease. No pleural effusions. No suspicious appearing pulmonary nodules or masses are noted. Upper Abdomen: Aortic atherosclerosis. Musculoskeletal: There are no aggressive appearing lytic or blastic lesions noted in the visualized portions of  the skeleton. Review of the MIP images confirms the above findings. IMPRESSION: 1. No acute findings are noted in the thorax to account for the patient's symptoms. Specifically, no evidence of pulmonary embolism. 2. Cardiomegaly with left atrial dilatation. 3. Mildly enlarged right paratracheal lymph node, only minimally increased in size compared to prior examination from 09/08/2019. This is nonspecific and favored to be benign. Follow-up contrast-enhanced chest CT is recommended in 6 months to ensure the stability or regression of this finding. Electronically Signed   By: Trudie Reed M.D.   On: 10/24/2022 06:29   DG Chest Port 1 View  Result Date: 10/24/2022 CLINICAL DATA:  62 year old male with history of chest pain. EXAM: PORTABLE CHEST 1 VIEW COMPARISON:  Chest x-ray 07/17/2021. FINDINGS: Lung volumes are normal. No consolidative airspace disease. No pleural effusions. No pneumothorax. No evidence of pulmonary edema. Heart size is mildly enlarged. The patient is rotated to the right on today's exam, resulting in distortion of the mediastinal contours and reduced diagnostic sensitivity and specificity for mediastinal pathology. IMPRESSION: 1. No radiographic evidence of acute cardiopulmonary disease. 2. Mild cardiomegaly. Electronically Signed   By: Trudie Reed M.D.   On: 10/24/2022 05:30    ECG & Cardiac Imaging    Afib @ 129 w/ nonspecific T changes  - personally reviewed.  Assessment & Plan    1.  Acute on chronic heart failure with reduced ejection fraction/nonischemic cardiomyopathy: Patient with a history of cardiomyopathy dating back to 2021, felt to be driven by prior heavy alcohol use, and longstanding atrial fibrillation with complicated management in the setting of prior noncompliance.  He stopped taking his medicines earlier this year in January, and over the past month to a month and a half, has been having progressive dyspnea on exertion, occasional lower extremity swelling,  increasing abdominal girth, and more recently orthopnea.  He has also been experiencing exertional chest tightness.  He presented to the emergency department on August 9 due to progression of symptoms.  A-fib with RVR on arrival with a BNP of 910.5.  Troponin was normal.  Chest x-ray showed cardiomegaly with vascular congestion.  CT of the chest was negative for PE.  Echo showed worsening of LV function with an EF of 20 to 25% with global hypokinesis.  He has responded well to IV Lasix thus far and renal function is stable.  He continues to have bibasilar crackles and JVD, and notes that he still cannot lie completely flat.  Continue Lasix 40 mg IV twice daily as well as beta-blocker, digoxin, and SGLT2 inhibitor.  Will add low-dose losartan and consider transition to [facility], though with history of noncompliance, he would be better served with once a day medications whenever possible.  Consider MRA, though I am concerned regarding potential for not following up, and need for labs.  With further reduction in LV function and chest tightness, I think he would benefit from diagnostic catheterization during this admission.  Will discuss with Dr. Cristal Deer.  2.  Permanent atrial fibrillation: Poorly rate controlled in the setting of not taking beta-blocker and digoxin at home.  He was also on diltiazem previously, as it was felt to be required for better rate control despite LV dysfunction.  With further reduction in LV dysfunction, would not resume diltiazem.  Continue current dose of beta-blocker and digoxin and adjust the former as necessary.  Currently on heparin.  Pending need for invasive procedures, will hold off on resuming oral anticoagulation at this time though we will look to get him back on Xarelto and will ask pharmacy to help with cost analysis.  3.  Hypokalemia: Supplement.  4.  Chest tightness: See #1.  Patient has been having exertional chest tightness.  Troponin normal.  Low risk Myoview in  2021.  In light of worsening LV dysfunction, and progressive symptoms, would likely benefit from diagnostic catheterization prior to discharge.  Risk Assessment/Risk Scores:     TIMI Risk Score for Unstable Angina or Non-ST Elevation MI:   The patient's TIMI risk score is 1, which indicates a 5% risk of all cause mortality, new or recurrent myocardial infarction or need for urgent revascularization in the next 14 days.  New York Heart Association (NYHA) Functional Class NYHA Class III  CHA2DS2-VASc Score = 1   This indicates a 0.6% annual risk of stroke. The patient's score is based upon: CHF History: 1 HTN History: 0 Diabetes History: 0 Stroke History: 0 Vascular Disease History: 0 Age Score: 0 Gender Score: 0      Signed, Nicolasa Ducking, NP 10/25/2022, 11:35 AM  For questions or updates, please contact   Please consult [email or web address] for contact info under Cardiology/STEMI.

## 2022-10-25 NOTE — Hospital Course (Addendum)
Calvin Esparza is a 62 y.o. male with medical history significant of chronic HFrEF with LVEF 30-40%, HTN, HLD, gout, PE and PAF on Xarelto, alcohol abuse, presented with worsening of fatigue, exertional dyspnea, and increasing leg swelling. Patient stopped taking all his medications since January this year due to insurance coverage issue. Started about 2 months ago, patient started to feel fatigued, exertional dyspnea and gradually getting worse.  08/09: to ED. chest x-ray showed cardiomegaly and pulmonary congestion, CT angiogram negative for PE. Creatinine 1.2, K3.8, WBC 7.2, hemoglobin 14.3. HR 170's. Tx w/ po metoprolol and IV lasix. Admitted for CHF, Afib.  08/10: HR remains elevated 110s. BP improved. Net IO Since Admission: -3L. Cr 1.04. Echo reviewed - EF 20-25%, global hypokinesis, indeterminate diastolic parameters, RV fxn reduced, mild pulm HTN, RA and LA dilation, mild/mod MR (compared to Echo 07/2021 w/ EF 40-45%). Cardio consult - plan for cardiac cath.  08/11: pending cath, continue diuresis, Net IO Since Admission: -6L 08/12: Net IO Since Admission: -7.5L. Cardiac cath showing significantly reduced cardiac output. Transferring to SDU for milrinone gtt   08/13: stable on milrinone.   Consultants:  Cardiology   Procedures: 10/27/22 cardiac cath       ASSESSMENT & PLAN:   Principal Problem:   CHF (congestive heart failure) (HCC) Active Problems:   Atrial fibrillation with RVR (HCC)   Acute on chronic combined systolic (congestive) and diastolic (congestive) heart failure (HCC)  Acute on chronic HFrEF decompensation Pulmonary HTN Severe reduced CO on cath  Secondary to medication noncompliance Transfer to SDU for milrinone gtt  Diuresis held for now Strict I&O, fluid restriction diet  Beta blocker - metoprolol succinate 12.5 mg daily  Farxiga Consider challenging with a low-dose spironolactone if blood pressure, renal function, and potassium allow.  Low threshold for  transfer to [facility] if his cardiac output does and/or we are unable to wean him from milrinone for further management by the advanced heart failure team.   Afib RVR - RVR resolved, remains elevated HR 110s Heparin gtt, plan to transition back to apixaban once it is clear that no further invasive procedures will be necessary at this admission.  Previously on diltiazem, will avoid in CHF  Metoprolol succinate 12.5 mg daily  Digoxin Telemetry until rate improved/stable  Cardiology following    History of cocaine abuse UDS ordered, pending   Alcohol abuse No symptoms or signs of active withdrawal CIWA protocol with as needed benzo Thiamine, Folic acid    History of PE CTA Chest negative this admission for PE Anticoag as above see Afib   DVT prophylaxis: heparin per cardiology  Pertinent IV fluids/nutrition: no continuous IV fluids w/ CHF, fluid restricted cardiac diet  Central lines / invasive devices: none  Code Status: FULL CODE ACP documentation reviewed: 10/25/22 none on file   Current Admission Status: inpatient   TOC needs / Dispo plan: TBD Barriers to discharge / significant pending items: SDU on milrinone gtt, anticipate here several more days, possible transfer to [facility] per cardiology

## 2022-10-25 NOTE — Progress Notes (Signed)
PROGRESS NOTE    MARQUAY COULON   WUJ:[id number] DOB: 05/30/1960  DOA: 10/24/2022 Date of Service: 10/25/22 PCP: Lauro Regulus, MD     Brief Narrative / Hospital Course:  RHYSON TRAUGHBER is a 62 y.o. male with medical history significant of chronic HFrEF with LVEF 30-40%, HTN, HLD, gout, PE and PAF on Xarelto, alcohol abuse, presented with worsening of fatigue, exertional dyspnea, and increasing leg swelling. Patient stopped taking all his medications since January this year due to insurance coverage issue. Started about 2 months ago, patient started to feel fatigued, exertional dyspnea and gradually getting worse.  08/09: to ED. chest x-ray showed cardiomegaly and pulmonary congestion, CT angiogram negative for PE. Creatinine 1.2, K3.8, WBC 7.2, hemoglobin 14.3. HR 170's. Tx w/ po metoprolol and IV lasix. Admitted for CHF, Afib.  08/10: HR remains elevated 110s. BP improved. Net IO Since Admission: -3,628.16 mL [10/25/22 0900]. Cr 1.04. Echo reviewed - EF 20-25%, global hypokinesis, indeterminate diastolic parameters, RV fxn reduced, mild pulm HTN, RA and LA dilation, mild/mod MR (compared to Echo 07/2021 w/ EF 40-45%). Cardio consult placed.   Consultants:  Cardiology   Procedures: none      ASSESSMENT & PLAN:   Principal Problem:   CHF (congestive heart failure) (HCC) Active Problems:   Atrial fibrillation with RVR (HCC)   Acute on chronic combined systolic (congestive) and diastolic (congestive) heart failure (HCC)    Acute on chronic HFrEF decompensation Pulmonary HTN Secondary to medication noncompliance Diuresis Lasix 40 mg IV q12h Strict I&O, fluid restriction diet  Beta blocker  [facility] consult - question need for ischemic eval given worsening EF   Afib RVR RVR resolved but still tachycardic  Heparin gtt x24h, plan transition to po anticoag, was previously on Xarelto, will ask pharmacy to confirm coverage/access to anticoag Beta  blocker Received 1 dose of digoxin 0.25 mg, was previously on 0.125 mg daily  Previously on diltiazem, will avoid in CHF  Resume beta blocker  Telemetry until rate improved/stable  Cardiology consult - appreciate recs re: continue digoxin vs other, given hx noncompliance    History of cocaine abuse UDS ordered,pending   Alcohol abuse No symptoms or signs of active withdrawal CIWA protocol with as needed benzo Thiamine, Folic acid    History of PE CTA Chest negative this admission for PE Anticoag as above see Afib   DVT prophylaxis: currently heparin gtt, plan transition to anticoagulation  Pertinent IV fluids/nutrition: no conitnueous IV fluids w/ CHF, fluid restricted cardiac diet  Central lines / invasive devices: none  Code Status: FULL CODE ACP documentation reviewed: 10/25/22 none on file   Current Admission Status: inpatient   TOC needs / Dispo plan: med assistance, other TBD Barriers to discharge / significant pending items: clinical improvement, diuresis, may need              Subjective / Brief ROS:  Patient reports doing okay this morning only issue is his alarms beeping  Denies CP. Still some SOB but better  Pain controlled.  Denies new weakness.  Reports no concerns w/ urination/defecation.   Family Communication: none at this time     Objective Findings:  Vitals:   10/25/22 0200 10/25/22 0332 10/25/22 0500 10/25/22 0600  BP:  112/87  112/87  Pulse: (!) 110 (!) 115  (!) 115  Resp:  18    Temp:  98.3 F (36.8 C)  98.3 F (36.8 C)  TempSrc:  Oral    SpO2:  97%  97%  Weight:   87.2 kg   Height:        Intake/Output Summary (Last 24 hours) at 10/25/2022 1132 Last data filed at 10/25/2022 0500 Gross per 24 hour  Intake 721.84 ml  Output 3000 ml  Net -2278.16 ml   Filed Weights   10/24/22 0502 10/25/22 0500  Weight: 88.5 kg 87.2 kg    Examination:  Physical Exam Constitutional:      General: He is not in acute  distress. Cardiovascular:     Rate and Rhythm: Tachycardia present. Rhythm irregular.  Pulmonary:     Effort: Pulmonary effort is normal.     Breath sounds: Examination of the right-lower field reveals rales. Examination of the left-lower field reveals rales. Rales present.  Abdominal:     Palpations: Abdomen is soft.  Musculoskeletal:     Right lower leg: Edema present.     Left lower leg: Edema present.  Skin:    General: Skin is warm and dry.  Neurological:     General: No focal deficit present.     Mental Status: He is alert and oriented to person, place, and time.  Psychiatric:        Mood and Affect: Mood normal.        Behavior: Behavior normal.          Scheduled Medications:   dapagliflozin propanediol  10 mg Oral QAC breakfast   digoxin  0.125 mg Oral Daily   folic acid  1 mg Oral Daily   furosemide  40 mg Intravenous Q12H   metoprolol tartrate  50 mg Oral BID   multivitamin with minerals  1 tablet Oral Daily   sodium chloride flush  3 mL Intravenous Q12H   thiamine  100 mg Oral Daily   Or   thiamine  100 mg Intravenous Daily    Continuous Infusions:  sodium chloride     heparin 1,350 Units/hr (10/25/22 0500)    PRN Medications:  sodium chloride, acetaminophen, LORazepam **OR** LORazepam, metoprolol tartrate, ondansetron (ZOFRAN) IV, sodium chloride flush  Antimicrobials from admission:  Anti-infectives (From admission, onward)    None           Data Reviewed:  I have personally reviewed the following...  CBC: Recent Labs  Lab 10/24/22 0513 10/25/22 0539  WBC 7.2 5.1  HGB 14.3 13.5  HCT 43.5 40.8  MCV 87.9 86.4  PLT 228 179   Basic Metabolic Panel: Recent Labs  Lab 10/24/22 0513 10/25/22 0539  NA 141 137  K 3.8 3.3*  CL 102 99  CO2 26 26  GLUCOSE 160* 99  BUN 22* 20  CREATININE 1.20 1.04  CALCIUM 9.1 8.6*   GFR: Estimated Creatinine Clearance: 78 mL/min (by C-G formula based on SCr of 1.04 mg/dL). Liver Function  Tests: No results for input(s): "AST", "ALT", "ALKPHOS", "BILITOT", "PROT", "ALBUMIN" in the last 168 hours. No results for input(s): "LIPASE", "AMYLASE" in the last 168 hours. No results for input(s): "AMMONIA" in the last 168 hours. Coagulation Profile: Recent Labs  Lab 10/24/22 0543  INR 1.2   Cardiac Enzymes: No results for input(s): "CKTOTAL", "CKMB", "CKMBINDEX", "TROPONINI" in the last 168 hours. BNP (last 3 results) No results for input(s): "PROBNP" in the last 8760 hours. HbA1C: Recent Labs    10/24/22 0854  HGBA1C 6.4*   CBG: Recent Labs  Lab 10/24/22 0932 10/24/22 1143 10/24/22 1747 10/24/22 2201 10/25/22 0809  GLUCAP 122* 106* 122* 127* 94   Lipid Profile: No results for  input(s): "CHOL", "HDL", "LDLCALC", "TRIG", "CHOLHDL", "LDLDIRECT" in the last 72 hours. Thyroid Function Tests: No results for input(s): "TSH", "T4TOTAL", "FREET4", "T3FREE", "THYROIDAB" in the last 72 hours. Anemia Panel: No results for input(s): "VITAMINB12", "FOLATE", "FERRITIN", "TIBC", "IRON", "RETICCTPCT" in the last 72 hours. Most Recent Urinalysis On File:  No results found for: "COLORURINE", "APPEARANCEUR", "LABSPEC", "PHURINE", "GLUCOSEU", "HGBUR", "BILIRUBINUR", "KETONESUR", "PROTEINUR", "UROBILINOGEN", "NITRITE", "LEUKOCYTESUR" Sepsis Labs: @LABRCNTIP (procalcitonin:4,lacticidven:4) Microbiology: No results found for this or any previous visit (from the past 240 hour(s)).    Radiology Studies last 3 days: DG Chest 1 View  Result Date: 10/25/2022 CLINICAL DATA:  [address] with CHF. EXAM: CHEST  1 VIEW COMPARISON:  CTA chest yesterday at 6:15 a.m., portable chest yesterday at 5:08 a.m. FINDINGS: 7:01 a.m. Moderate cardiomegaly. There continues to be mild central vascular prominence but no overt edema. The lungs are clear of infiltrates. There is no substantial pleural effusion. The mediastinum is stable with lipomatosis and mild aortic tortuosity and atherosclerosis. No new osseous  abnormality. Thoracic spondylosis. Overlying monitor wires. IMPRESSION: Cardiomegaly with mild central vascular prominence but no overt edema or evidence of pneumonia. No significant change from yesterday's study. Electronically Signed   By: Almira Bar M.D.   On: 10/25/2022 07:46   ECHOCARDIOGRAM COMPLETE  Result Date: 10/24/2022    ECHOCARDIOGRAM REPORT   Patient Name:   TRAESHAWN ALBANY Date of Exam: 10/24/2022 Medical Rec #:  [id number]        Height:       70.0 in Accession #:    [id number]       Weight:       195.0 lb Date of Birth:  [birth year]       BSA:          2.065 m Patient Age:    61 years         BP:           118/106 mmHg Patient Gender: M                HR:           107 bpm. Exam Location:  [facility] Procedure: 2D Echo, Cardiac Doppler, Color Doppler and Intracardiac            Opacification Agent Indications:     CHF  History:         Patient has prior history of Echocardiogram examinations, most                  recent 07/18/2021. CHF and Cardiomyopathy, Arrythmias:Atrial                  Fibrillation; Risk Factors:Hypertension and Dyslipidemia. ETOH                  abuse.  Sonographer:     Mikki Harbor Referring Phys:  [id number] Emeline General Diagnosing Phys: Debbe Odea MD IMPRESSIONS  1. Left ventricular ejection fraction, by estimation, is 20 to 25%. Left ventricular ejection fraction by PLAX is 23 %. The left ventricle has severely decreased function. The left ventricle demonstrates global hypokinesis. Left ventricular diastolic parameters are indeterminate.  2. Right ventricular systolic function is mildly reduced. The right ventricular size is mildly enlarged. There is mildly elevated pulmonary artery systolic pressure.  3. Left atrial size was severely dilated.  4. Right atrial size was mildly dilated.  5. The mitral valve is normal in structure. Mild to moderate mitral valve regurgitation.  6. The aortic valve is tricuspid.  Aortic valve regurgitation is mild.  7. The inferior vena  cava is dilated in size with >50% respiratory variability, suggesting right atrial pressure of 8 mmHg. FINDINGS  Left Ventricle: Left ventricular ejection fraction, by estimation, is 20 to 25%. Left ventricular ejection fraction by PLAX is 23 %. The left ventricle has severely decreased function. The left ventricle demonstrates global hypokinesis. Definity contrast agent was given IV to delineate the left ventricular endocardial borders. The left ventricular internal cavity size was normal in size. There is no left ventricular hypertrophy. Left ventricular diastolic parameters are indeterminate. Right Ventricle: The right ventricular size is mildly enlarged. No increase in right ventricular wall thickness. Right ventricular systolic function is mildly reduced. There is mildly elevated pulmonary artery systolic pressure. The tricuspid regurgitant  velocity is 2.68 m/s, and with an assumed right atrial pressure of 15 mmHg, the estimated right ventricular systolic pressure is 43.7 mmHg. Left Atrium: Left atrial size was severely dilated. Right Atrium: Right atrial size was mildly dilated. Pericardium: There is no evidence of pericardial effusion. Mitral Valve: The mitral valve is normal in structure. Mild to moderate mitral valve regurgitation. MV peak gradient, 3.0 mmHg. The mean mitral valve gradient is 1.0 mmHg. Tricuspid Valve: The tricuspid valve is normal in structure. Tricuspid valve regurgitation is mild. Aortic Valve: The aortic valve is tricuspid. Aortic valve regurgitation is mild. Aortic valve mean gradient measures 2.0 mmHg. Aortic valve peak gradient measures 3.2 mmHg. Aortic valve area, by VTI measures 1.98 cm. Pulmonic Valve: The pulmonic valve was not well visualized. Pulmonic valve regurgitation is not visualized. Aorta: The aortic root and ascending aorta are structurally normal, with no evidence of dilitation. Venous: The inferior vena cava is dilated in size with greater than 50% respiratory  variability, suggesting right atrial pressure of 8 mmHg. IAS/Shunts: No atrial level shunt detected by color flow Doppler.  LEFT VENTRICLE PLAX 2D LV EF:         Left ventricular ejection fraction by PLAX is 23 %. LVIDd:         5.70 cm LVIDs:         5.10 cm LV PW:         0.80 cm LV IVS:        1.10 cm LVOT diam:     2.00 cm LV SV:         31 LV SV Index:   15 LVOT Area:     3.14 cm  RIGHT VENTRICLE RV Basal diam:  4.70 cm RV Mid diam:    3.40 cm RV S prime:     12.30 cm/s LEFT ATRIUM              Index        RIGHT ATRIUM           Index LA diam:        5.10 cm  2.47 cm/m   RA Area:     24.00 cm LA Vol (A2C):   116.0 ml 56.17 ml/m  RA Volume:   78.60 ml  38.06 ml/m LA Vol (A4C):   149.0 ml 72.16 ml/m LA Biplane Vol: 141.0 ml 68.28 ml/m  AORTIC VALVE                    PULMONIC VALVE AV Area (Vmax):    2.14 cm     PV Vmax:       0.68 m/s AV Area (Vmean):   1.93 cm     PV  Peak grad:  1.8 mmHg AV Area (VTI):     1.98 cm AV Vmax:           90.10 cm/s AV Vmean:          61.100 cm/s AV VTI:            0.159 m AV Peak Grad:      3.2 mmHg AV Mean Grad:      2.0 mmHg LVOT Vmax:         61.25 cm/s LVOT Vmean:        37.450 cm/s LVOT VTI:          0.100 m LVOT/AV VTI ratio: 0.63  AORTA Ao Root diam: 3.40 cm Ao Asc diam:  3.30 cm MITRAL VALVE               TRICUSPID VALVE MV Area (PHT): 4.68 cm    TR Peak grad:   28.7 mmHg MV Area VTI:   2.08 cm    TR Vmax:        268.00 cm/s MV Peak grad:  3.0 mmHg MV Mean grad:  1.0 mmHg    SHUNTS MV Vmax:       0.86 m/s    Systemic VTI:  0.10 m MV Vmean:      50.3 cm/s   Systemic Diam: 2.00 cm MV Decel Time: 162 msec MV E velocity: 57.10 cm/s Debbe Odea MD Electronically signed by Debbe Odea MD Signature Date/Time: 10/24/2022/4:08:29 PM    Final    CT Angio Chest PE W/Cm &/Or Wo Cm  Result Date: 10/24/2022 CLINICAL DATA:  62 year old male with history of mid chest pain and shortness of breath. EXAM: CT ANGIOGRAPHY CHEST WITH CONTRAST TECHNIQUE: Multidetector CT  imaging of the chest was performed using the standard protocol during bolus administration of intravenous contrast. Multiplanar CT image reconstructions and MIPs were obtained to evaluate the vascular anatomy. RADIATION DOSE REDUCTION: This exam was performed according to the departmental dose-optimization program which includes automated exposure control, adjustment of the mA and/or kV according to patient size and/or use of iterative reconstruction technique. CONTRAST:  75mL OMNIPAQUE IOHEXOL 350 MG/ML SOLN COMPARISON:  Chest CTA 09/08/2019. FINDINGS: Cardiovascular: No filling defects are noted within the pulmonary arterial tree to suggest pulmonary embolism. Heart size is enlarged with left atrial dilatation. There is no significant pericardial fluid, thickening or pericardial calcification. No atherosclerotic calcifications are noted in the thoracic aorta or the coronary arteries. Mediastinum/Nodes: Multiple prominent borderline enlarged mediastinal and bilateral hilar lymph nodes are noted, nonspecific. Mildly enlarged right paratracheal lymph node (axial image 41 of series 4) currently measuring 1.6 cm in short axis (previously 1.3 cm on 09/08/2019). Esophagus is unremarkable in appearance. No axillary lymphadenopathy. Lungs/Pleura: No acute consolidative airspace disease. No pleural effusions. No suspicious appearing pulmonary nodules or masses are noted. Upper Abdomen: Aortic atherosclerosis. Musculoskeletal: There are no aggressive appearing lytic or blastic lesions noted in the visualized portions of the skeleton. Review of the MIP images confirms the above findings. IMPRESSION: 1. No acute findings are noted in the thorax to account for the patient's symptoms. Specifically, no evidence of pulmonary embolism. 2. Cardiomegaly with left atrial dilatation. 3. Mildly enlarged right paratracheal lymph node, only minimally increased in size compared to prior examination from 09/08/2019. This is nonspecific and  favored to be benign. Follow-up contrast-enhanced chest CT is recommended in 6 months to ensure the stability or regression of this finding. Electronically Signed   By: Brayton Mars.D.  On: 10/24/2022 06:29   DG Chest Port 1 View  Result Date: 10/24/2022 CLINICAL DATA:  62 year old male with history of chest pain. EXAM: PORTABLE CHEST 1 VIEW COMPARISON:  Chest x-ray 07/17/2021. FINDINGS: Lung volumes are normal. No consolidative airspace disease. No pleural effusions. No pneumothorax. No evidence of pulmonary edema. Heart size is mildly enlarged. The patient is rotated to the right on today's exam, resulting in distortion of the mediastinal contours and reduced diagnostic sensitivity and specificity for mediastinal pathology. IMPRESSION: 1. No radiographic evidence of acute cardiopulmonary disease. 2. Mild cardiomegaly. Electronically Signed   By: Trudie Reed M.D.   On: 10/24/2022 05:30             LOS: 1 day   .  Sunnie Nielsen, DO [facility] 10/25/2022, 11:32 AM    Dictation software may have been used to generate the above note. Typos may occur and escape review in typed/dictated notes. Please contact Dr Lyn Hollingshead directly for clarity if needed.  Staff may message me via secure chat in Epic  but this may not receive an immediate response,  please page me for urgent matters!  If 7PM-7AM, please contact night coverage [email or web address]

## 2022-10-25 NOTE — Consult Note (Signed)
ANTICOAGULATION CONSULT NOTE  Pharmacy Consult for Heparin Infusion Indication: atrial fibrillation  No Known Allergies  Patient Measurements: Height: 5\' 10"  (177.8 cm) Weight: 87.2 kg (192 lb 4.7 oz) IBW/kg (Calculated) : 73 Heparin Dosing Weight: 88.5 kg  Vital Signs: Temp: 98.3 F (36.8 C) (08/10 0600) Temp Source: Oral (08/10 0332) BP: 112/87 (08/10 0600) Pulse Rate: 115 (08/10 0600)  Labs: Recent Labs    10/24/22 0513 10/24/22 0543 10/24/22 0543 10/24/22 0732 10/24/22 1008 10/24/22 1153 10/25/22 0539  HGB 14.3  --   --   --   --   --  13.5  HCT 43.5  --   --   --   --   --  40.8  PLT 228  --   --   --   --   --  179  APTT  --  27  --   --   --   --   --   LABPROT  --  15.1  --   --   --   --   --   INR  --  1.2  --   --   --   --   --   HEPARINUNFRC  --  <0.10*   < >  --  0.52 0.44 0.42  CREATININE 1.20  --   --   --   --   --  1.04  TROPONINIHS 15  --   --  14  --   --   --    < > = values in this interval not displayed.    Estimated Creatinine Clearance: 78 mL/min (by C-G formula based on SCr of 1.04 mg/dL).   Medical History: Past Medical History:  Diagnosis Date   Alcohol abuse    Chronic pain    COVID-19    a. 02/2019   HFrEF (heart failure with reduced ejection fraction) (HCC)    a. 05/2019 Echo: EF 35-40%.   History of medication noncompliance    Longstanding persistent atrial fibrillation (HCC)    a. Dx 03/2018; b. 05/2019 recurrent Afib in setting of PE. CHA2DS2VASc = 1-->Eliquis later changed to xarelto; b. 07/2019 s/p DCCV (150J); d. 08/2019 admit w/ AF RVR in settting of noncompliance - longstanding persistent afib since on bb/ccb rx.   NICM (nonischemic cardiomyopathy) (HCC)    a. 05/2019 Echo: EF 35-40%, gr1 DD. Mod enlarged RV. Mildly dil LA. Mild to mod dil RA. Triv AI; b. 05/2019 MV: EF 32%, no ischemia.   Pulmonary embolism (HCC)    a. 05/2019 CTA Chest: mild amt of PE w/in a lower lobe branch of RPA; b. 08/2019 CTA Chest: Small PE in RML and  RLL PA.    Medications:  Patient hasn't been taking Rivaroxaban since January due to insurance reasons  Also not on any rate control medications for same reason (Digoxin and Metoprolol)  Assessment: Calvin Esparza is a 62 yo old male with a history of afib was on rivaroxaban PTA with RVR and PE not taking any of his medications since January of 2024 due to loss of insurance. Presented to the ED with a CHF exacerbation, dyspnea, peripheral edema and weight gain. Baseline heparin level was < 0.1, so we will monitor heparin using heparin level. CHADS-VASc: 4. Pharmacy consulted for heparin due to afib with history of PE.   Baseline Labs: aPTT 27, HL <0.10, PT 15.1, INR 1.2, Hgb 14.3, Hct 43.5, Plt 228   Goal of Therapy:  Heparin level 0.3-0.7 units/ml Monitor  platelets by anticoagulation protocol: Yes   Date Time HL Rate/Comment  8/9 1153 0.44 1350/therapeutic x1 8/9 1808 0.52 1350/therapeutic x2 8/10 0539 0.42 1350/therapeutic x3   Plan:  Continue heparin infusion at 1350 units/hr Check HL daily while on heparin infusion Continue to monitor H&H and platelets daily while on heparin infusion   Bettey Costa, PharmD Clinical Pharmacist 10/25/2022 7:15 AM

## 2022-10-25 NOTE — Progress Notes (Signed)
Per charge nurse, there is no clip/vest on the unit to do this reading.

## 2022-10-25 NOTE — Plan of Care (Signed)

## 2022-10-25 NOTE — Discharge Instructions (Signed)

## 2022-10-26 DIAGNOSIS — I4821 Permanent atrial fibrillation: Secondary | ICD-10-CM | POA: Diagnosis not present

## 2022-10-26 DIAGNOSIS — I5043 Acute on chronic combined systolic (congestive) and diastolic (congestive) heart failure: Secondary | ICD-10-CM | POA: Diagnosis not present

## 2022-10-26 LAB — BASIC METABOLIC PANEL WITH GFR
Anion gap: 11 (ref 5–15)
BUN: 23 mg/dL — ABNORMAL HIGH (ref 6–20)
CO2: 25 mmol/L (ref 22–32)
Calcium: 8.8 mg/dL — ABNORMAL LOW (ref 8.9–10.3)
Chloride: 98 mmol/L (ref 98–111)
Creatinine, Ser: 1.21 mg/dL (ref 0.61–1.24)
GFR, Estimated: >60 mL/min (ref >60)
Glucose, Bld: 103 mg/dL — ABNORMAL HIGH (ref 70–99)
Potassium: 3.5 mmol/L (ref 3.5–5.1)
Sodium: 134 mmol/L — ABNORMAL LOW (ref 135–145)

## 2022-10-26 LAB — HEPARIN LEVEL (UNFRACTIONATED): Heparin Unfractionated: 0.4 [IU]/mL (ref 0.30–0.70)

## 2022-10-26 MED ORDER — POTASSIUM CHLORIDE CRYS ER 20 MEQ PO TBCR
40.0000 meq | EXTENDED_RELEASE_TABLET | Freq: Once | ORAL | Status: AC
  Administered 2022-10-26: 40 meq via ORAL
  Filled 2022-10-26: qty 2

## 2022-10-26 MED ORDER — SENNOSIDES-DOCUSATE SODIUM 8.6-50 MG PO TABS: 2.0000 | ORAL_TABLET | Freq: Every evening | ORAL | Status: DC | PRN

## 2022-10-26 MED ORDER — SODIUM CHLORIDE 0.9 % IV SOLN: INTRAVENOUS | Status: DC

## 2022-10-26 MED ORDER — TRAZODONE HCL 100 MG PO TABS
100.0000 mg | ORAL_TABLET | Freq: Every evening | ORAL | Status: DC | PRN
  Administered 2022-10-26 – 2022-10-29 (×2): 100 mg via ORAL
  Filled 2022-10-26 (×2): qty 1

## 2022-10-26 MED ORDER — POLYETHYLENE GLYCOL 3350 17 G PO PACK
17.0000 g | PACK | Freq: Every day | ORAL | Status: DC
  Administered 2022-10-29: 17 g via ORAL
  Filled 2022-10-26 (×3): qty 1

## 2022-10-26 MED ORDER — METOPROLOL SUCCINATE ER 100 MG PO TB24
100.0000 mg | ORAL_TABLET | Freq: Every day | ORAL | Status: DC
  Administered 2022-10-27: 100 mg via ORAL
  Filled 2022-10-26: qty 1

## 2022-10-26 MED ORDER — TORSEMIDE 20 MG PO TABS
40.0000 mg | ORAL_TABLET | Freq: Every day | ORAL | Status: DC
  Administered 2022-10-27: 40 mg via ORAL
  Filled 2022-10-26: qty 2

## 2022-10-26 NOTE — Progress Notes (Deleted)
Discharged to [facility] with family driving and son in car as well. Discharge instruction given to and reviewed with patient and his son by Susy Frizzle, Forensic scientist. Patient left [facility] alert with no distress noted.

## 2022-10-26 NOTE — Plan of Care (Signed)
  Problem: Coping: Goal: Ability to adjust to condition or change in health will improve Outcome: Progressing   Problem: Fluid Volume: Goal: Ability to maintain a balanced intake and output will improve Outcome: Progressing   Problem: Metabolic: Goal: Ability to maintain appropriate glucose levels will improve Outcome: Progressing   

## 2022-10-26 NOTE — Progress Notes (Signed)
PROGRESS NOTE    Calvin Esparza   XBM:[id number] DOB: [birth year]  DOA: 10/24/2022 Date of Service: 10/26/22 PCP: Calvin Regulus, MD     Brief Narrative / Hospital Course:  Calvin Esparza is a 62 y.o. male with medical history significant of chronic HFrEF with LVEF 30-40%, HTN, HLD, gout, PE and PAF on Xarelto, alcohol abuse, presented with worsening of fatigue, exertional dyspnea, and increasing leg swelling. Patient stopped taking all his medications since January this year due to insurance coverage issue. Started about 2 months ago, patient started to feel fatigued, exertional dyspnea and gradually getting worse.  08/09: to ED. chest x-ray showed cardiomegaly and pulmonary congestion, CT angiogram negative for PE. Creatinine 1.2, K3.8, WBC 7.2, hemoglobin 14.3. HR 170's. Tx w/ po metoprolol and IV lasix. Admitted for CHF, Afib.  08/10: HR remains elevated 110s. BP improved. Net IO Since Admission: -3L. Cr 1.04. Echo reviewed - EF 20-25%, global hypokinesis, indeterminate diastolic parameters, RV fxn reduced, mild pulm HTN, RA and LA dilation, mild/mod MR (compared to Echo 07/2021 w/ EF 40-45%). Cardio consult - plan for cardiac cath.  08/11: pending cath, continue diuresis, Net IO Since Admission: -6L  Consultants:  Cardiology   Procedures: none      ASSESSMENT & PLAN:   Principal Problem:   CHF (congestive heart failure) (HCC) Active Problems:   Atrial fibrillation with RVR (HCC)   Acute on chronic combined systolic (congestive) and diastolic (congestive) heart failure (HCC)    Acute on chronic HFrEF decompensation Pulmonary HTN Secondary to medication noncompliance Diuresis Lasix 40 mg IV q12h Strict I&O, fluid restriction diet  Beta blocker  Farx[facility] Cardiology planning catheterization for ischemic eval   Afib RVR - RVR resolved, remains elevated HR 110s Heparin gtt pending cardiac cath  Previously on diltiazem, will avoid in CHF  Metoprolol   Digoxin Telemetry until rate improved/stable  Cardiology following    History of cocaine abuse UDS ordered, pending   Alcohol abuse No symptoms or signs of active withdrawal CIWA protocol with as needed benzo Thiamine, Folic acid    History of PE CTA Chest negative this admission for PE Anticoag as above see Afib   DVT prophylaxis: currently heparin gtt, plan transition to anticoagulation following cardaic cath  Pertinent IV fluids/nutrition: no continuous IV fluids w/ CHF, fluid restricted cardiac diet  Central lines / invasive devices: none  Code Status: FULL CODE ACP documentation reviewed: 10/25/22 none on file   Current Admission Status: inpatient   TOC needs / Dispo plan: med assistance, other TBD Barriers to discharge / significant pending items: cardiac cath pending for tomorrow              Subjective / Brief ROS:  Patient reports doing okay this morning  Denies CP.  SOB still present but better than yesterday Pain controlled.  Denies new weakness.  Reports no concerns w/ urination/defecation.   Family Communication: none at this time     Objective Findings:  Vitals:   10/25/22 2351 10/26/22 0537 10/26/22 0834 10/26/22 1140  BP: 108/82 (!) 110/90 105/74 91/68  Pulse: (!) 111 (!) 112 (!) 110 96  Resp: 18 16 16 16   Temp: 98.1 F (36.7 C) 97.8 F (36.6 C) 98.7 F (37.1 C) 98.7 F (37.1 C)  TempSrc: Oral Oral Oral Oral  SpO2: 94% 96% 93% 94%  Weight:  87.1 kg    Height:        Intake/Output Summary (Last 24 hours) at 10/26/2022 1308 Last data  filed at 10/26/2022 1035 Gross per 24 hour  Intake 1095.44 ml  Output 3300 ml  Net -2204.56 ml   Filed Weights   10/24/22 0502 10/25/22 0500 10/26/22 0537  Weight: 88.5 kg 87.2 kg 87.1 kg    Examination:  Physical Exam Constitutional:      General: He is not in acute distress. Cardiovascular:     Rate and Rhythm: Tachycardia present. Rhythm irregular.  Pulmonary:     Effort: Pulmonary  effort is normal.     Breath sounds: Examination of the right-lower field reveals rales. Examination of the left-lower field reveals rales. Rales present.  Abdominal:     Palpations: Abdomen is soft.  Musculoskeletal:     Right lower leg: Edema present.     Left lower leg: Edema present.  Skin:    General: Skin is warm and dry.  Neurological:     General: No focal deficit present.     Mental Status: He is alert and oriented to person, place, and time.  Psychiatric:        Mood and Affect: Mood normal.        Behavior: Behavior normal.          Scheduled Medications:   [START ON 10/27/2022] aspirin  81 mg Oral Pre-Cath   dapagliflozin propanediol  10 mg Oral QAC breakfast   digoxin  0.125 mg Oral Daily   folic acid  1 mg Oral Daily   losartan  12.5 mg Oral Daily   [START ON 10/27/2022] metoprolol succinate  100 mg Oral Daily   metoprolol tartrate  50 mg Oral BID   multivitamin with minerals  1 tablet Oral Daily   sodium chloride flush  3 mL Intravenous Q12H   thiamine  100 mg Oral Daily   Or   thiamine  100 mg Intravenous Daily   [START ON 10/27/2022] torsemide  40 mg Oral Daily    Continuous Infusions:  sodium chloride     heparin 1,350 Units/hr (10/25/22 1950)    PRN Medications:  sodium chloride, acetaminophen, LORazepam **OR** LORazepam, metoprolol tartrate, ondansetron (ZOFRAN) IV, sodium chloride flush  Antimicrobials from admission:  Anti-infectives (From admission, onward)    None           Data Reviewed:  I have personally reviewed the following...  CBC: Recent Labs  Lab 10/24/22 0513 10/25/22 0539  WBC 7.2 5.1  HGB 14.3 13.5  HCT 43.5 40.8  MCV 87.9 86.4  PLT 228 179   Basic Metabolic Panel: Recent Labs  Lab 10/24/22 0513 10/25/22 0539 10/26/22 0422  NA 141 137 134*  K 3.8 3.3* 3.5  CL 102 99 98  CO2 26 26 25   GLUCOSE 160* 99 103*  BUN 22* 20 23*  CREATININE 1.20 1.04 1.21  CALCIUM 9.1 8.6* 8.8*   GFR: Estimated Creatinine  Clearance: 67 mL/min (by C-G formula based on SCr of 1.21 mg/dL). Liver Function Tests: No results for input(s): "AST", "ALT", "ALKPHOS", "BILITOT", "PROT", "ALBUMIN" in the last 168 hours. No results for input(s): "LIPASE", "AMYLASE" in the last 168 hours. No results for input(s): "AMMONIA" in the last 168 hours. Coagulation Profile: Recent Labs  Lab 10/24/22 0543  INR 1.2   Cardiac Enzymes: No results for input(s): "CKTOTAL", "CKMB", "CKMBINDEX", "TROPONINI" in the last 168 hours. BNP (last 3 results) No results for input(s): "PROBNP" in the last 8760 hours. HbA1C: Recent Labs    10/24/22 0854  HGBA1C 6.4*   CBG: Recent Labs  Lab 10/24/22 1143 10/24/22  1747 10/24/22 2201 10/25/22 0809 10/25/22 1603  GLUCAP 106* 122* 127* 94 106*   Lipid Profile: No results for input(s): "CHOL", "HDL", "LDLCALC", "TRIG", "CHOLHDL", "LDLDIRECT" in the last 72 hours. Thyroid Function Tests: No results for input(s): "TSH", "T4TOTAL", "FREET4", "T3FREE", "THYROIDAB" in the last 72 hours. Anemia Panel: No results for input(s): "VITAMINB12", "FOLATE", "FERRITIN", "TIBC", "IRON", "RETICCTPCT" in the last 72 hours. Most Recent Urinalysis On File:  No results found for: "COLORURINE", "APPEARANCEUR", "LABSPEC", "PHURINE", "GLUCOSEU", "HGBUR", "BILIRUBINUR", "KETONESUR", "PROTEINUR", "UROBILINOGEN", "NITRITE", "LEUKOCYTESUR" Sepsis Labs: @LABRCNTIP (procalcitonin:4,lacticidven:4) Microbiology: No results found for this or any previous visit (from the past 240 hour(s)).    Radiology Studies last 3 days: DG Chest 1 View  Result Date: 10/25/2022 CLINICAL DATA:  [address] with CHF. EXAM: CHEST  1 VIEW COMPARISON:  CTA chest yesterday at 6:15 a.m., portable chest yesterday at 5:08 a.m. FINDINGS: 7:01 a.m. Moderate cardiomegaly. There continues to be mild central vascular prominence but no overt edema. The lungs are clear of infiltrates. There is no substantial pleural effusion. The mediastinum is stable  with lipomatosis and mild aortic tortuosity and atherosclerosis. No new osseous abnormality. Thoracic spondylosis. Overlying monitor wires. IMPRESSION: Cardiomegaly with mild central vascular prominence but no overt edema or evidence of pneumonia. No significant change from yesterday's study. Electronically Signed   By: Almira Bar M.D.   On: 10/25/2022 07:46   ECHOCARDIOGRAM COMPLETE  Result Date: 10/24/2022    ECHOCARDIOGRAM REPORT   Patient Name:   ANASTASIA DEHOOG Date of Exam: 10/24/2022 Medical Rec #:  [id number]        Height:       70.0 in Accession #:    [id number]       Weight:       195.0 lb Date of Birth:  [birth year]       BSA:          2.065 m Patient Age:    61 years         BP:           118/106 mmHg Patient Gender: M                HR:           107 bpm. Exam Location:  [facility] Procedure: 2D Echo, Cardiac Doppler, Color Doppler and Intracardiac            Opacification Agent Indications:     CHF  History:         Patient has prior history of Echocardiogram examinations, most                  recent 07/18/2021. CHF and Cardiomyopathy, Arrythmias:Atrial                  Fibrillation; Risk Factors:Hypertension and Dyslipidemia. ETOH                  abuse.  Sonographer:     Mikki Harbor Referring Phys:  [id number] Emeline General Diagnosing Phys: Debbe Odea MD IMPRESSIONS  1. Left ventricular ejection fraction, by estimation, is 20 to 25%. Left ventricular ejection fraction by PLAX is 23 %. The left ventricle has severely decreased function. The left ventricle demonstrates global hypokinesis. Left ventricular diastolic parameters are indeterminate.  2. Right ventricular systolic function is mildly reduced. The right ventricular size is mildly enlarged. There is mildly elevated pulmonary artery systolic pressure.  3. Left atrial size was severely dilated.  4. Right atrial size was mildly dilated.  5. The mitral valve is normal in structure. Mild to moderate mitral valve regurgitation.  6. The aortic  valve is tricuspid. Aortic valve regurgitation is mild.  7. The inferior vena cava is dilated in size with >50% respiratory variability, suggesting right atrial pressure of 8 mmHg. FINDINGS  Left Ventricle: Left ventricular ejection fraction, by estimation, is 20 to 25%. Left ventricular ejection fraction by PLAX is 23 %. The left ventricle has severely decreased function. The left ventricle demonstrates global hypokinesis. Definity contrast agent was given IV to delineate the left ventricular endocardial borders. The left ventricular internal cavity size was normal in size. There is no left ventricular hypertrophy. Left ventricular diastolic parameters are indeterminate. Right Ventricle: The right ventricular size is mildly enlarged. No increase in right ventricular wall thickness. Right ventricular systolic function is mildly reduced. There is mildly elevated pulmonary artery systolic pressure. The tricuspid regurgitant  velocity is 2.68 m/s, and with an assumed right atrial pressure of 15 mmHg, the estimated right ventricular systolic pressure is 43.7 mmHg. Left Atrium: Left atrial size was severely dilated. Right Atrium: Right atrial size was mildly dilated. Pericardium: There is no evidence of pericardial effusion. Mitral Valve: The mitral valve is normal in structure. Mild to moderate mitral valve regurgitation. MV peak gradient, 3.0 mmHg. The mean mitral valve gradient is 1.0 mmHg. Tricuspid Valve: The tricuspid valve is normal in structure. Tricuspid valve regurgitation is mild. Aortic Valve: The aortic valve is tricuspid. Aortic valve regurgitation is mild. Aortic valve mean gradient measures 2.0 mmHg. Aortic valve peak gradient measures 3.2 mmHg. Aortic valve area, by VTI measures 1.98 cm. Pulmonic Valve: The pulmonic valve was not well visualized. Pulmonic valve regurgitation is not visualized. Aorta: The aortic root and ascending aorta are structurally normal, with no evidence of dilitation. Venous: The  inferior vena cava is dilated in size with greater than 50% respiratory variability, suggesting right atrial pressure of 8 mmHg. IAS/Shunts: No atrial level shunt detected by color flow Doppler.  LEFT VENTRICLE PLAX 2D LV EF:         Left ventricular ejection fraction by PLAX is 23 %. LVIDd:         5.70 cm LVIDs:         5.10 cm LV PW:         0.80 cm LV IVS:        1.10 cm LVOT diam:     2.00 cm LV SV:         31 LV SV Index:   15 LVOT Area:     3.14 cm  RIGHT VENTRICLE RV Basal diam:  4.70 cm RV Mid diam:    3.40 cm RV S prime:     12.30 cm/s LEFT ATRIUM              Index        RIGHT ATRIUM           Index LA diam:        5.10 cm  2.47 cm/m   RA Area:     24.00 cm LA Vol (A2C):   116.0 ml 56.17 ml/m  RA Volume:   78.60 ml  38.06 ml/m LA Vol (A4C):   149.0 ml 72.16 ml/m LA Biplane Vol: 141.0 ml 68.28 ml/m  AORTIC VALVE                    PULMONIC VALVE AV Area (Vmax):    2.14 cm     PV  Vmax:       0.68 m/s AV Area (Vmean):   1.93 cm     PV Peak grad:  1.8 mmHg AV Area (VTI):     1.98 cm AV Vmax:           90.10 cm/s AV Vmean:          61.100 cm/s AV VTI:            0.159 m AV Peak Grad:      3.2 mmHg AV Mean Grad:      2.0 mmHg LVOT Vmax:         61.25 cm/s LVOT Vmean:        37.450 cm/s LVOT VTI:          0.100 m LVOT/AV VTI ratio: 0.63  AORTA Ao Root diam: 3.40 cm Ao Asc diam:  3.30 cm MITRAL VALVE               TRICUSPID VALVE MV Area (PHT): 4.68 cm    TR Peak grad:   28.7 mmHg MV Area VTI:   2.08 cm    TR Vmax:        268.00 cm/s MV Peak grad:  3.0 mmHg MV Mean grad:  1.0 mmHg    SHUNTS MV Vmax:       0.86 m/s    Systemic VTI:  0.10 m MV Vmean:      50.3 cm/s   Systemic Diam: 2.00 cm MV Decel Time: 162 msec MV E velocity: 57.10 cm/s Debbe Odea MD Electronically signed by Debbe Odea MD Signature Date/Time: 10/24/2022/4:08:29 PM    Final    CT Angio Chest PE W/Cm &/Or Wo Cm  Result Date: 10/24/2022 CLINICAL DATA:  62 year old male with history of mid chest pain and shortness of  breath. EXAM: CT ANGIOGRAPHY CHEST WITH CONTRAST TECHNIQUE: Multidetector CT imaging of the chest was performed using the standard protocol during bolus administration of intravenous contrast. Multiplanar CT image reconstructions and MIPs were obtained to evaluate the vascular anatomy. RADIATION DOSE REDUCTION: This exam was performed according to the departmental dose-optimization program which includes automated exposure control, adjustment of the mA and/or kV according to patient size and/or use of iterative reconstruction technique. CONTRAST:  75mL OMNIPAQUE IOHEXOL 350 MG/ML SOLN COMPARISON:  Chest CTA 09/08/2019. FINDINGS: Cardiovascular: No filling defects are noted within the pulmonary arterial tree to suggest pulmonary embolism. Heart size is enlarged with left atrial dilatation. There is no significant pericardial fluid, thickening or pericardial calcification. No atherosclerotic calcifications are noted in the thoracic aorta or the coronary arteries. Mediastinum/Nodes: Multiple prominent borderline enlarged mediastinal and bilateral hilar lymph nodes are noted, nonspecific. Mildly enlarged right paratracheal lymph node (axial image 41 of series 4) currently measuring 1.6 cm in short axis (previously 1.3 cm on 09/08/2019). Esophagus is unremarkable in appearance. No axillary lymphadenopathy. Lungs/Pleura: No acute consolidative airspace disease. No pleural effusions. No suspicious appearing pulmonary nodules or masses are noted. Upper Abdomen: Aortic atherosclerosis. Musculoskeletal: There are no aggressive appearing lytic or blastic lesions noted in the visualized portions of the skeleton. Review of the MIP images confirms the above findings. IMPRESSION: 1. No acute findings are noted in the thorax to account for the patient's symptoms. Specifically, no evidence of pulmonary embolism. 2. Cardiomegaly with left atrial dilatation. 3. Mildly enlarged right paratracheal lymph node, only minimally increased in  size compared to prior examination from 09/08/2019. This is nonspecific and favored to be benign. Follow-up contrast-enhanced chest CT is recommended in  6 months to ensure the stability or regression of this finding. Electronically Signed   By: Trudie Reed M.D.   On: 10/24/2022 06:29   DG Chest Port 1 View  Result Date: 10/24/2022 CLINICAL DATA:  62 year old male with history of chest pain. EXAM: PORTABLE CHEST 1 VIEW COMPARISON:  Chest x-ray 07/17/2021. FINDINGS: Lung volumes are normal. No consolidative airspace disease. No pleural effusions. No pneumothorax. No evidence of pulmonary edema. Heart size is mildly enlarged. The patient is rotated to the right on today's exam, resulting in distortion of the mediastinal contours and reduced diagnostic sensitivity and specificity for mediastinal pathology. IMPRESSION: 1. No radiographic evidence of acute cardiopulmonary disease. 2. Mild cardiomegaly. Electronically Signed   By: Trudie Reed M.D.   On: 10/24/2022 05:30             LOS: 2 days   .  Sunnie Nielsen, DO [facility] 10/26/2022, 1:08 PM    Dictation software may have been used to generate the above note. Typos may occur and escape review in typed/dictated notes. Please contact Dr Lyn Hollingshead directly for clarity if needed.  Staff may message me via secure chat in Epic  but this may not receive an immediate response,  please page me for urgent matters!  If 7PM-7AM, please contact night coverage [email or web address]

## 2022-10-26 NOTE — Progress Notes (Signed)
Mobility Specialist - Progress Note   10/26/22 1001  Mobility  Activity Ambulated with assistance in hallway;Stood at bedside;Dangled on edge of bed  Level of Assistance Standby assist, set-up cues, supervision of patient - no hands on  Assistive Device None  Distance Ambulated (ft) 200 ft  Activity Response Tolerated well  Mobility Referral Yes  $Mobility charge 1 Mobility  Mobility Specialist Start Time (ACUTE ONLY) 0932  Mobility Specialist Stop Time (ACUTE ONLY) 0946  Mobility Specialist Time Calculation (min) (ACUTE ONLY) 14 min   Pt supine in bed on RA upon arrival. Pt completes bed mobility, STS, and ambulates in hallway Supervision with no physical assist needed. Pt returns to EOB with needs in reach.   Terrilyn Saver  Mobility Specialist  10/26/22 10:02 AM

## 2022-10-26 NOTE — Progress Notes (Signed)
Cardiology Progress Note   Patient Name: Calvin Esparza Date of Encounter: 10/26/2022  Primary Cardiologist: Julien Nordmann, MD  Subjective   Breathing improved.  Notes good response to lasix yesterday.  No chest pain. Plan for cath tomorrow - all questions answered.  Inpatient Medications    Scheduled Meds:  [START ON 10/27/2022] aspirin  81 mg Oral Pre-Cath   dapagliflozin propanediol  10 mg Oral QAC breakfast   digoxin  0.125 mg Oral Daily   folic acid  1 mg Oral Daily   furosemide  40 mg Intravenous Q12H   losartan  12.5 mg Oral Daily   metoprolol tartrate  50 mg Oral BID   multivitamin with minerals  1 tablet Oral Daily   sodium chloride flush  3 mL Intravenous Q12H   thiamine  100 mg Oral Daily   Or   thiamine  100 mg Intravenous Daily   Continuous Infusions:  sodium chloride     heparin 1,350 Units/hr (10/25/22 1950)   PRN Meds: sodium chloride, acetaminophen, LORazepam **OR** LORazepam, metoprolol tartrate, ondansetron (ZOFRAN) IV, sodium chloride flush   Vital Signs    Vitals:   10/25/22 1942 10/25/22 2351 10/26/22 0537 10/26/22 0834  BP: 113/84 108/82 (!) 110/90 105/74  Pulse: (!) 107 (!) 111 (!) 112 (!) 110  Resp: 18 18 16 16   Temp:  98.1 F (36.7 C) 97.8 F (36.6 C) 98.7 F (37.1 C)  TempSrc:  Oral Oral Oral  SpO2: 98% 94% 96% 93%  Weight:   87.1 kg   Height:        Intake/Output Summary (Last 24 hours) at 10/26/2022 0907 Last data filed at 10/26/2022 0600 Gross per 24 hour  Intake 1041.59 ml  Output 3450 ml  Net -2408.41 ml   Filed Weights   10/24/22 0502 10/25/22 0500 10/26/22 0537  Weight: 88.5 kg 87.2 kg 87.1 kg    Physical Exam   GEN: Well nourished, well developed, in no acute distress.  HEENT: Grossly normal.  Neck: Supple, no JVD, carotid bruits, or masses. Cardiac: IR, IR, tachy, no murmurs, rubs, or gallops. No clubbing, cyanosis, edema.  Radials 2+, DP/PT 2+ and equal bilaterally.  Respiratory:  Respirations regular and  unlabored, crackles @ L base, otw CTA. GI: Soft, nontender, nondistended, BS + x 4. MS: no deformity or atrophy. Skin: warm and dry, no rash. Neuro:  Strength and sensation are intact. Psych: AAOx3.  Normal affect.  Labs    Chemistry Recent Labs  Lab 10/24/22 0513 10/25/22 0539 10/26/22 0422  NA 141 137 134*  K 3.8 3.3* 3.5  CL 102 99 98  CO2 26 26 25   GLUCOSE 160* 99 103*  BUN 22* 20 23*  CREATININE 1.20 1.04 1.21  CALCIUM 9.1 8.6* 8.8*  GFRNONAA >60 >60 >60  ANIONGAP 13 12 11      Hematology Recent Labs  Lab 10/24/22 0513 10/25/22 0539  WBC 7.2 5.1  RBC 4.95 4.72  HGB 14.3 13.5  HCT 43.5 40.8  MCV 87.9 86.4  MCH 28.9 28.6  MCHC 32.9 33.1  RDW 16.9* 16.6*  PLT 228 179    Cardiac Enzymes  Recent Labs  Lab 10/24/22 0513 10/24/22 0732  TROPONINIHS 15 14      BNP    Component Value Date/Time   BNP 910.5 (H) 10/24/2022 0513   Lipids  Lab Results  Component Value Date   CHOL 209 (H) 07/10/2020   HDL 57 07/10/2020   LDLCALC 133 (H) 07/10/2020   TRIG  107 07/10/2020   CHOLHDL 3.7 07/10/2020    HbA1c  Lab Results  Component Value Date   HGBA1C 6.4 (H) 10/24/2022    Radiology    DG Chest 1 View  Result Date: 10/25/2022 CLINICAL DATA:  [address] with CHF. EXAM: CHEST  1 VIEW COMPARISON:  CTA chest yesterday at 6:15 a.m., portable chest yesterday at 5:08 a.m. FINDINGS: 7:01 a.m. Moderate cardiomegaly. There continues to be mild central vascular prominence but no overt edema. The lungs are clear of infiltrates. There is no substantial pleural effusion. The mediastinum is stable with lipomatosis and mild aortic tortuosity and atherosclerosis. No new osseous abnormality. Thoracic spondylosis. Overlying monitor wires. IMPRESSION: Cardiomegaly with mild central vascular prominence but no overt edema or evidence of pneumonia. No significant change from yesterday's study. Electronically Signed   By: Almira Bar M.D.   On: 10/25/2022 07:46   CT Angio Chest PE  W/Cm &/Or Wo Cm  Result Date: 10/24/2022 CLINICAL DATA:  62 year old male with history of mid chest pain and shortness of breath. EXAM: CT ANGIOGRAPHY CHEST WITH CONTRAST TECHNIQUE: Multidetector CT imaging of the chest was performed using the standard protocol during bolus administration of intravenous contrast. Multiplanar CT image reconstructions and MIPs were obtained to evaluate the vascular anatomy. RADIATION DOSE REDUCTION: This exam was performed according to the departmental dose-optimization program which includes automated exposure control, adjustment of the mA and/or kV according to patient size and/or use of iterative reconstruction technique. CONTRAST:  75mL OMNIPAQUE IOHEXOL 350 MG/ML SOLN COMPARISON:  Chest CTA 09/08/2019. FINDINGS: Cardiovascular: No filling defects are noted within the pulmonary arterial tree to suggest pulmonary embolism. Heart size is enlarged with left atrial dilatation. There is no significant pericardial fluid, thickening or pericardial calcification. No atherosclerotic calcifications are noted in the thoracic aorta or the coronary arteries. Mediastinum/Nodes: Multiple prominent borderline enlarged mediastinal and bilateral hilar lymph nodes are noted, nonspecific. Mildly enlarged right paratracheal lymph node (axial image 41 of series 4) currently measuring 1.6 cm in short axis (previously 1.3 cm on 09/08/2019). Esophagus is unremarkable in appearance. No axillary lymphadenopathy. Lungs/Pleura: No acute consolidative airspace disease. No pleural effusions. No suspicious appearing pulmonary nodules or masses are noted. Upper Abdomen: Aortic atherosclerosis. Musculoskeletal: There are no aggressive appearing lytic or blastic lesions noted in the visualized portions of the skeleton. Review of the MIP images confirms the above findings. IMPRESSION: 1. No acute findings are noted in the thorax to account for the patient's symptoms. Specifically, no evidence of pulmonary embolism.  2. Cardiomegaly with left atrial dilatation. 3. Mildly enlarged right paratracheal lymph node, only minimally increased in size compared to prior examination from 09/08/2019. This is nonspecific and favored to be benign. Follow-up contrast-enhanced chest CT is recommended in 6 months to ensure the stability or regression of this finding. Electronically Signed   By: Trudie Reed M.D.   On: 10/24/2022 06:29   DG Chest Port 1 View  Result Date: 10/24/2022 CLINICAL DATA:  62 year old male with history of chest pain. EXAM: PORTABLE CHEST 1 VIEW COMPARISON:  Chest x-ray 07/17/2021. FINDINGS: Lung volumes are normal. No consolidative airspace disease. No pleural effusions. No pneumothorax. No evidence of pulmonary edema. Heart size is mildly enlarged. The patient is rotated to the right on today's exam, resulting in distortion of the mediastinal contours and reduced diagnostic sensitivity and specificity for mediastinal pathology. IMPRESSION: 1. No radiographic evidence of acute cardiopulmonary disease. 2. Mild cardiomegaly. Electronically Signed   By: Trudie Reed M.D.   On: 10/24/2022 05:30  Telemetry    Afib 100-110 - Personally Reviewed  Cardiac Studies   2D Echocardiogram 8.9.2024   1. Left ventricular ejection fraction, by estimation, is 20 to 25%. Left  ventricular ejection fraction by PLAX is 23 %. The left ventricle has  severely decreased function. The left ventricle demonstrates global  hypokinesis. Left ventricular diastolic  parameters are indeterminate.   2. Right ventricular systolic function is mildly reduced. The right  ventricular size is mildly enlarged. There is mildly elevated pulmonary  artery systolic pressure.   3. Left atrial size was severely dilated.   4. Right atrial size was mildly dilated.   5. The mitral valve is normal in structure. Mild to moderate mitral valve  regurgitation.   6. The aortic valve is tricuspid. Aortic valve regurgitation is mild.   7.  The inferior vena cava is dilated in size with >50% respiratory  variability, suggesting right atrial pressure of 8 mmHg.  _____________   Patient Profile     62 y.o. male with a history of NICM, chronic HFrEF, permanent Afib, PE in 2021, noncompliance, ETOH abuse, gout, and chronic pain, who was admitted 8/9 w/ volume overload and rapid Afib in the setting of noncompliance.  Assessment & Plan    1.  Acute on chronic heart failure with reduced ejection fraction/nonischemic cardiomyopathy: Patient with a history of cardiomyopathy dating back to 2021, felt to be driven by prior heavy alcohol use, and longstanding atrial fibrillation with complicated management in the setting of prior noncompliance.  Stopped meds except prn lasix in 03/2022. Admitted 8/9 w/ progressive volume overload, dyspnea, orthopnea, and inc abd girth.  Echo w/ further reduction in EF to 20-25%.  He has responded well to IV lasix.  Minus 2.4 L since overnight and 6 L since admission.  No significant change in wt - 87.1 kg this AM, which is actually below office wt from last year.  BUN/Creat up slightly this AM @ 23/1.21 respectively, though overall similar to admission creat. HR's low 100's to 1-teens. BP's relatively soft - low 100's.  Appears euvolemic on exam.  Received IV lasix this AM.  Will hold PM dose and change to torsemide for tomorrow (was on @ home).  Cont ? blocker and will transition to metoprolol succinate for tomorrow.  Cont low-dose ARB.  Pressure likely too soft to transition to entresto or add MRA, though will consider if stable following ? blocker transition. Cont farxiga.  Plan for R & L heart cath on 8/12.  The patient understands that risks include but are not limited to stroke (1 in 1000), death (1 in 1000), kidney failure [usually temporary] (1 in 500), bleeding (1 in 200), allergic reaction [possibly serious] (1 in 200), and agrees to proceed.    2.  Permanent Afib:  rates 100 - 1-teens.  Cont ? blocker and  consolidate to metop succinate for tomorrow.  Cont digoxin and f/u level in AM.  Cont heparin w/ plan to transition back to xarelto post-cath (stopped taking xarelto @ home earlier this year but notes that his insurance made it affordable - will ask pharmacy to evaluate cost prior to discharge).  3.  [place]: Intermittent chest tightness prior to admission.  HsTrop nl.  Cath tomorrow as outlined above.  4.  Hypokalemia: Low nl @ 3.5.  Will supplement today.  F/u bmet in AM.  Signed, Nicolasa Ducking, NP  10/26/2022, 9:07 AM    For questions or updates, please contact   Please consult [email or web address] for contact  info under Cardiology/STEMI.

## 2022-10-26 NOTE — H&P (View-Only) (Signed)
Cardiology Progress Note   Patient Name: Calvin Esparza Date of Encounter: 10/26/2022  Primary Cardiologist: Julien Nordmann, MD  Subjective   Breathing improved.  Notes good response to lasix yesterday.  No chest pain. Plan for cath tomorrow - all questions answered.  Inpatient Medications    Scheduled Meds:  [START ON 10/27/2022] aspirin  81 mg Oral Pre-Cath   dapagliflozin propanediol  10 mg Oral QAC breakfast   digoxin  0.125 mg Oral Daily   folic acid  1 mg Oral Daily   furosemide  40 mg Intravenous Q12H   losartan  12.5 mg Oral Daily   metoprolol tartrate  50 mg Oral BID   multivitamin with minerals  1 tablet Oral Daily   sodium chloride flush  3 mL Intravenous Q12H   thiamine  100 mg Oral Daily   Or   thiamine  100 mg Intravenous Daily   Continuous Infusions:  sodium chloride     heparin 1,350 Units/hr (10/25/22 1950)   PRN Meds: sodium chloride, acetaminophen, LORazepam **OR** LORazepam, metoprolol tartrate, ondansetron (ZOFRAN) IV, sodium chloride flush   Vital Signs    Vitals:   10/25/22 1942 10/25/22 2351 10/26/22 0537 10/26/22 0834  BP: 113/84 108/82 (!) 110/90 105/74  Pulse: (!) 107 (!) 111 (!) 112 (!) 110  Resp: 18 18 16 16   Temp:  98.1 F (36.7 C) 97.8 F (36.6 C) 98.7 F (37.1 C)  TempSrc:  Oral Oral Oral  SpO2: 98% 94% 96% 93%  Weight:   87.1 kg   Height:        Intake/Output Summary (Last 24 hours) at 10/26/2022 0907 Last data filed at 10/26/2022 0600 Gross per 24 hour  Intake 1041.59 ml  Output 3450 ml  Net -2408.41 ml   Filed Weights   10/24/22 0502 10/25/22 0500 10/26/22 0537  Weight: 88.5 kg 87.2 kg 87.1 kg    Physical Exam   GEN: Well nourished, well developed, in no acute distress.  HEENT: Grossly normal.  Neck: Supple, no JVD, carotid bruits, or masses. Cardiac: IR, IR, tachy, no murmurs, rubs, or gallops. No clubbing, cyanosis, edema.  Radials 2+, DP/PT 2+ and equal bilaterally.  Respiratory:  Respirations regular and  unlabored, crackles @ L base, otw CTA. GI: Soft, nontender, nondistended, BS + x 4. MS: no deformity or atrophy. Skin: warm and dry, no rash. Neuro:  Strength and sensation are intact. Psych: AAOx3.  Normal affect.  Labs    Chemistry Recent Labs  Lab 10/24/22 0513 10/25/22 0539 10/26/22 0422  NA 141 137 134*  K 3.8 3.3* 3.5  CL 102 99 98  CO2 26 26 25   GLUCOSE 160* 99 103*  BUN 22* 20 23*  CREATININE 1.20 1.04 1.21  CALCIUM 9.1 8.6* 8.8*  GFRNONAA >60 >60 >60  ANIONGAP 13 12 11      Hematology Recent Labs  Lab 10/24/22 0513 10/25/22 0539  WBC 7.2 5.1  RBC 4.95 4.72  HGB 14.3 13.5  HCT 43.5 40.8  MCV 87.9 86.4  MCH 28.9 28.6  MCHC 32.9 33.1  RDW 16.9* 16.6*  PLT 228 179    Cardiac Enzymes  Recent Labs  Lab 10/24/22 0513 10/24/22 0732  TROPONINIHS 15 14      BNP    Component Value Date/Time   BNP 910.5 (H) 10/24/2022 0513   Lipids  Lab Results  Component Value Date   CHOL 209 (H) 07/10/2020   HDL 57 07/10/2020   LDLCALC 133 (H) 07/10/2020   TRIG  107 07/10/2020   CHOLHDL 3.7 07/10/2020    HbA1c  Lab Results  Component Value Date   HGBA1C 6.4 (H) 10/24/2022    Radiology    DG Chest 1 View  Result Date: 10/25/2022 CLINICAL DATA:  [address] with CHF. EXAM: CHEST  1 VIEW COMPARISON:  CTA chest yesterday at 6:15 a.m., portable chest yesterday at 5:08 a.m. FINDINGS: 7:01 a.m. Moderate cardiomegaly. There continues to be mild central vascular prominence but no overt edema. The lungs are clear of infiltrates. There is no substantial pleural effusion. The mediastinum is stable with lipomatosis and mild aortic tortuosity and atherosclerosis. No new osseous abnormality. Thoracic spondylosis. Overlying monitor wires. IMPRESSION: Cardiomegaly with mild central vascular prominence but no overt edema or evidence of pneumonia. No significant change from yesterday's study. Electronically Signed   By: Almira Bar M.D.   On: 10/25/2022 07:46   CT Angio Chest PE  W/Cm &/Or Wo Cm  Result Date: 10/24/2022 CLINICAL DATA:  62 year old male with history of mid chest pain and shortness of breath. EXAM: CT ANGIOGRAPHY CHEST WITH CONTRAST TECHNIQUE: Multidetector CT imaging of the chest was performed using the standard protocol during bolus administration of intravenous contrast. Multiplanar CT image reconstructions and MIPs were obtained to evaluate the vascular anatomy. RADIATION DOSE REDUCTION: This exam was performed according to the departmental dose-optimization program which includes automated exposure control, adjustment of the mA and/or kV according to patient size and/or use of iterative reconstruction technique. CONTRAST:  75mL OMNIPAQUE IOHEXOL 350 MG/ML SOLN COMPARISON:  Chest CTA 09/08/2019. FINDINGS: Cardiovascular: No filling defects are noted within the pulmonary arterial tree to suggest pulmonary embolism. Heart size is enlarged with left atrial dilatation. There is no significant pericardial fluid, thickening or pericardial calcification. No atherosclerotic calcifications are noted in the thoracic aorta or the coronary arteries. Mediastinum/Nodes: Multiple prominent borderline enlarged mediastinal and bilateral hilar lymph nodes are noted, nonspecific. Mildly enlarged right paratracheal lymph node (axial image 41 of series 4) currently measuring 1.6 cm in short axis (previously 1.3 cm on 09/08/2019). Esophagus is unremarkable in appearance. No axillary lymphadenopathy. Lungs/Pleura: No acute consolidative airspace disease. No pleural effusions. No suspicious appearing pulmonary nodules or masses are noted. Upper Abdomen: Aortic atherosclerosis. Musculoskeletal: There are no aggressive appearing lytic or blastic lesions noted in the visualized portions of the skeleton. Review of the MIP images confirms the above findings. IMPRESSION: 1. No acute findings are noted in the thorax to account for the patient's symptoms. Specifically, no evidence of pulmonary embolism.  2. Cardiomegaly with left atrial dilatation. 3. Mildly enlarged right paratracheal lymph node, only minimally increased in size compared to prior examination from 09/08/2019. This is nonspecific and favored to be benign. Follow-up contrast-enhanced chest CT is recommended in 6 months to ensure the stability or regression of this finding. Electronically Signed   By: Trudie Reed M.D.   On: 10/24/2022 06:29   DG Chest Port 1 View  Result Date: 10/24/2022 CLINICAL DATA:  62 year old male with history of chest pain. EXAM: PORTABLE CHEST 1 VIEW COMPARISON:  Chest x-ray 07/17/2021. FINDINGS: Lung volumes are normal. No consolidative airspace disease. No pleural effusions. No pneumothorax. No evidence of pulmonary edema. Heart size is mildly enlarged. The patient is rotated to the right on today's exam, resulting in distortion of the mediastinal contours and reduced diagnostic sensitivity and specificity for mediastinal pathology. IMPRESSION: 1. No radiographic evidence of acute cardiopulmonary disease. 2. Mild cardiomegaly. Electronically Signed   By: Trudie Reed M.D.   On: 10/24/2022 05:30  Telemetry    Afib 100-110 - Personally Reviewed  Cardiac Studies   2D Echocardiogram 8.9.2024   1. Left ventricular ejection fraction, by estimation, is 20 to 25%. Left  ventricular ejection fraction by PLAX is 23 %. The left ventricle has  severely decreased function. The left ventricle demonstrates global  hypokinesis. Left ventricular diastolic  parameters are indeterminate.   2. Right ventricular systolic function is mildly reduced. The right  ventricular size is mildly enlarged. There is mildly elevated pulmonary  artery systolic pressure.   3. Left atrial size was severely dilated.   4. Right atrial size was mildly dilated.   5. The mitral valve is normal in structure. Mild to moderate mitral valve  regurgitation.   6. The aortic valve is tricuspid. Aortic valve regurgitation is mild.   7.  The inferior vena cava is dilated in size with >50% respiratory  variability, suggesting right atrial pressure of 8 mmHg.  _____________   Patient Profile     62 y.o. male with a history of NICM, chronic HFrEF, permanent Afib, PE in 2021, noncompliance, ETOH abuse, gout, and chronic pain, who was admitted 8/9 w/ volume overload and rapid Afib in the setting of noncompliance.  Assessment & Plan    1.  Acute on chronic heart failure with reduced ejection fraction/nonischemic cardiomyopathy: Patient with a history of cardiomyopathy dating back to 2021, felt to be driven by prior heavy alcohol use, and longstanding atrial fibrillation with complicated management in the setting of prior noncompliance.  Stopped meds except prn lasix in 03/2022. Admitted 8/9 w/ progressive volume overload, dyspnea, orthopnea, and inc abd girth.  Echo w/ further reduction in EF to 20-25%.  He has responded well to IV lasix.  Minus 2.4 L since overnight and 6 L since admission.  No significant change in wt - 87.1 kg this AM, which is actually below office wt from last year.  BUN/Creat up slightly this AM @ 23/1.21 respectively, though overall similar to admission creat. HR's low 100's to 1-teens. BP's relatively soft - low 100's.  Appears euvolemic on exam.  Received IV lasix this AM.  Will hold PM dose and change to torsemide for tomorrow (was on @ home).  Cont ? blocker and will transition to metoprolol succinate for tomorrow.  Cont low-dose ARB.  Pressure likely too soft to transition to entresto or add MRA, though will consider if stable following ? blocker transition. Cont farxiga.  Plan for R & L heart cath on 8/12.  The patient understands that risks include but are not limited to stroke (1 in 1000), death (1 in 1000), kidney failure [usually temporary] (1 in 500), bleeding (1 in 200), allergic reaction [possibly serious] (1 in 200), and agrees to proceed.    2.  Permanent Afib:  rates 100 - 1-teens.  Cont ? blocker and  consolidate to metop succinate for tomorrow.  Cont digoxin and f/u level in AM.  Cont heparin w/ plan to transition back to xarelto post-cath (stopped taking xarelto @ home earlier this year but notes that his insurance made it affordable - will ask pharmacy to evaluate cost prior to discharge).  3.  [place]: Intermittent chest tightness prior to admission.  HsTrop nl.  Cath tomorrow as outlined above.  4.  Hypokalemia: Low nl @ 3.5.  Will supplement today.  F/u bmet in AM.  Signed, Nicolasa Ducking, NP  10/26/2022, 9:07 AM    For questions or updates, please contact   Please consult [email or web address] for contact  info under Cardiology/STEMI.

## 2022-10-26 NOTE — Consult Note (Signed)
ANTICOAGULATION CONSULT NOTE  Pharmacy Consult for Heparin Infusion Indication: atrial fibrillation  No Known Allergies  Patient Measurements: Height: 5\' 10"  (177.8 cm) Weight: 87.2 kg (192 lb 4.7 oz) IBW/kg (Calculated) : 73 Heparin Dosing Weight: 88.5 kg  Vital Signs: Temp: 97.8 F (36.6 C) (08/11 0537) Temp Source: Oral (08/11 0537) BP: 110/90 (08/11 0537) Pulse Rate: 112 (08/11 0537)  Labs: Recent Labs    10/24/22 0513 10/24/22 0543 10/24/22 0732 10/24/22 1008 10/24/22 1153 10/25/22 0539 10/26/22 0422  HGB 14.3  --   --   --   --  13.5  --   HCT 43.5  --   --   --   --  40.8  --   PLT 228  --   --   --   --  179  --   APTT  --  27  --   --   --   --   --   LABPROT  --  15.1  --   --   --   --   --   INR  --  1.2  --   --   --   --   --   HEPARINUNFRC  --  <0.10*  --    < > 0.44 0.42 0.40  CREATININE 1.20  --   --   --   --  1.04  --   TROPONINIHS 15  --  14  --   --   --   --    < > = values in this interval not displayed.    Estimated Creatinine Clearance: 78 mL/min (by C-G formula based on SCr of 1.04 mg/dL).   Medical History: Past Medical History:  Diagnosis Date   Alcohol abuse    Chronic pain    COVID-19    a. 02/2019   HFrEF (heart failure with reduced ejection fraction) (HCC)    a. 05/2019 Echo: EF 35-40%; b. 07/2021 Echo: EF 40-45%; c. 10/2022 Echo: EF 20-25%, glob HK, mildly reduced RV fxn, sev dil LA, mid-mod MR, mild AI.   History of medication noncompliance    Longstanding persistent atrial fibrillation (HCC)    a. Dx 03/2018; b. 05/2019 recurrent Afib in setting of PE. CHA2DS2VASc = 1-->Eliquis later changed to xarelto; b. 07/2019 s/p DCCV (150J); d. 08/2019 admit w/ AF RVR in settting of noncompliance - longstanding persistent afib since on bb/ccb rx.   NICM (nonischemic cardiomyopathy) (HCC)    a. 05/2019 Echo: EF 35-40%; b. 05/2019 MV: EF 32%, no ischemia; c. 07/2021 Echo: EF 40-45%; d. 10/2022 Echo: EF 20-25%, glob HK.   Pulmonary embolism (HCC)     a. 05/2019 CTA Chest: mild amt of PE w/in a lower lobe branch of RPA; b. 08/2019 CTA Chest: Small PE in RML and RLL PA.    Medications:  Patient hasn't been taking Rivaroxaban since January due to insurance reasons  Also not on any rate control medications for same reason (Digoxin and Metoprolol)  Assessment: Calvin Esparza is a 62 yo old male with a history of afib was on rivaroxaban PTA with RVR and PE not taking any of his medications since January of 2024 due to loss of insurance. Presented to the ED with a CHF exacerbation, dyspnea, peripheral edema and weight gain. Baseline heparin level was < 0.1, so we will monitor heparin using heparin level. CHADS-VASc: 4. Pharmacy consulted for heparin due to afib with history of PE.   Baseline Labs: aPTT 27, HL <0.10, PT  15.1, INR 1.2, Hgb 14.3, Hct 43.5, Plt 228   Goal of Therapy:  Heparin level 0.3-0.7 units/ml Monitor platelets by anticoagulation protocol: Yes   Date Time HL Rate/Comment  8/9 1153 0.44 1350/therapeutic x1 8/9 1808 0.52 1350/therapeutic x2 8/10 0539 0.42 1350/therapeutic x3 8/11 0422 0.40 1350/therapeutic x 4   Plan:  Continue heparin infusion at 1350 units/hr Check HL daily while on heparin infusion Continue to monitor H&H and platelets daily while on heparin infusion   Otelia Sergeant, PharmD, [facility] 10/26/2022 5:53 AM

## 2022-10-27 ENCOUNTER — Other Ambulatory Visit (HOSPITAL_COMMUNITY): Payer: Self-pay

## 2022-10-27 ENCOUNTER — Encounter
Admission: EM | Disposition: A | Discharge status: Home / Self Care | Payer: Self-pay | Source: Home / Self Care | Attending: Osteopathic Medicine

## 2022-10-27 ENCOUNTER — Telehealth (HOSPITAL_COMMUNITY): Payer: Self-pay

## 2022-10-27 ENCOUNTER — Inpatient Hospital Stay: Payer: 59

## 2022-10-27 ENCOUNTER — Other Ambulatory Visit: Payer: Self-pay

## 2022-10-27 DIAGNOSIS — I5023 Acute on chronic systolic (congestive) heart failure: Secondary | ICD-10-CM | POA: Diagnosis not present

## 2022-10-27 DIAGNOSIS — I5043 Acute on chronic combined systolic (congestive) and diastolic (congestive) heart failure: Secondary | ICD-10-CM | POA: Diagnosis not present

## 2022-10-27 DIAGNOSIS — I5021 Acute systolic (congestive) heart failure: Secondary | ICD-10-CM

## 2022-10-27 DIAGNOSIS — I4821 Permanent atrial fibrillation: Secondary | ICD-10-CM | POA: Diagnosis not present

## 2022-10-27 HISTORY — PX: RIGHT/LEFT HEART CATH AND CORONARY ANGIOGRAPHY: CATH118266

## 2022-10-27 LAB — POCT I-STAT EG7
Acid-Base Excess: 6 mmol/L — ABNORMAL HIGH (ref 0.0–2.0)
Bicarbonate: 31.2 mmol/L — ABNORMAL HIGH (ref 20.0–28.0)
Calcium, Ion: 1.14 mmol/L — ABNORMAL LOW (ref 1.15–1.40)
HCT: 48 % (ref 39.0–52.0)
Hemoglobin: 16.3 g/dL (ref 13.0–17.0)
O2 Saturation: 53 %
Potassium: 3.8 mmol/L (ref 3.5–5.1)
Sodium: 140 mmol/L (ref 135–145)
TCO2: 33 mmol/L — ABNORMAL HIGH (ref 22–32)
pCO2, Ven: 46.8 mmHg (ref 44–60)
pH, Ven: 7.431 — ABNORMAL HIGH (ref 7.25–7.43)
pO2, Ven: 28 mmHg — CL (ref 32–45)

## 2022-10-27 LAB — POCT I-STAT 7, (LYTES, BLD GAS, ICA,H+H)
Acid-Base Excess: 6 mmol/L — ABNORMAL HIGH (ref 0.0–2.0)
Bicarbonate: 29.8 mmol/L — ABNORMAL HIGH (ref 20.0–28.0)
Calcium, Ion: 1.17 mmol/L (ref 1.15–1.40)
HCT: 49 % (ref 39.0–52.0)
Hemoglobin: 16.7 g/dL (ref 13.0–17.0)
O2 Saturation: 97 %
Potassium: 3.9 mmol/L (ref 3.5–5.1)
Sodium: 138 mmol/L (ref 135–145)
TCO2: 31 mmol/L (ref 22–32)
pCO2 arterial: 37.4 mmHg (ref 32–48)
pH, Arterial: 7.509 — ABNORMAL HIGH (ref 7.35–7.45)
pO2, Arterial: 77 mmHg — ABNORMAL LOW (ref 83–108)

## 2022-10-27 LAB — MRSA NEXT GEN BY PCR, NASAL: MRSA by PCR Next Gen: NOT DETECTED

## 2022-10-27 LAB — COOXEMETRY PANEL
Carboxyhemoglobin: 1.6 % — ABNORMAL HIGH (ref 0.5–1.5)
Methemoglobin: <0.7 % (ref 0.0–1.5)
O2 Saturation: 46.5 %
Total hemoglobin: 14.9 g/dL (ref 12.0–16.0)
Total oxygen content: 45.7 %

## 2022-10-27 LAB — LACTIC ACID, PLASMA
Lactic Acid, Venous: 0.9 mmol/L (ref 0.5–1.9)
Lactic Acid, Venous: 1.5 mmol/L (ref 0.5–1.9)

## 2022-10-27 LAB — HEPARIN LEVEL (UNFRACTIONATED): Heparin Unfractionated: 0.16 IU/mL — ABNORMAL LOW (ref 0.30–0.70)

## 2022-10-27 SURGERY — RIGHT/LEFT HEART CATH AND CORONARY ANGIOGRAPHY
Anesthesia: Moderate Sedation

## 2022-10-27 MED ORDER — METOPROLOL SUCCINATE ER 25 MG PO TB24
12.5000 mg | ORAL_TABLET | Freq: Every day | ORAL | Status: DC
  Administered 2022-10-28 – 2022-10-29 (×2): 12.5 mg via ORAL
  Filled 2022-10-27 (×3): qty 0.5

## 2022-10-27 MED ORDER — SODIUM CHLORIDE 0.9% FLUSH
10.0000 mL | Freq: Two times a day (BID) | INTRAVENOUS | Status: DC
  Administered 2022-10-27: 20 mL
  Administered 2022-10-28: 10 mL
  Administered 2022-10-28: 20 mL
  Administered 2022-10-29 – 2022-10-30 (×4): 10 mL

## 2022-10-27 MED ORDER — IOHEXOL 300 MG/ML  SOLN
INTRAMUSCULAR | Status: DC | PRN
  Administered 2022-10-27: 32 mL

## 2022-10-27 MED ORDER — HEPARIN SODIUM (PORCINE) 1000 UNIT/ML IJ SOLN
INTRAMUSCULAR | Status: DC | PRN
  Administered 2022-10-27: 4000 [IU] via INTRAVENOUS

## 2022-10-27 MED ORDER — SODIUM CHLORIDE 0.9 % IV SOLN: 250.0000 mL | INTRAVENOUS | Status: DC | PRN

## 2022-10-27 MED ORDER — HEPARIN SODIUM (PORCINE) 1000 UNIT/ML IJ SOLN
INTRAMUSCULAR | Status: AC
  Filled 2022-10-27: qty 10

## 2022-10-27 MED ORDER — MILRINONE LACTATE IN DEXTROSE 20-5 MG/100ML-% IV SOLN
0.0625 ug/kg/min | INTRAVENOUS | Status: DC
  Administered 2022-10-27 – 2022-10-28 (×2): 0.125 ug/kg/min via INTRAVENOUS
  Filled 2022-10-27: qty 100

## 2022-10-27 MED ORDER — HEPARIN BOLUS VIA INFUSION
2650.0000 [IU] | Freq: Once | INTRAVENOUS | Status: AC
  Administered 2022-10-27: 2650 [IU] via INTRAVENOUS
  Filled 2022-10-27: qty 2650

## 2022-10-27 MED ORDER — VERAPAMIL HCL 2.5 MG/ML IV SOLN
INTRAVENOUS | Status: DC | PRN
  Administered 2022-10-27 (×2): 2.5 mg via INTRA_ARTERIAL

## 2022-10-27 MED ORDER — LIDOCAINE HCL 1 % IJ SOLN
INTRAMUSCULAR | Status: AC
  Filled 2022-10-27: qty 20

## 2022-10-27 MED ORDER — SODIUM CHLORIDE 0.9% FLUSH
3.0000 mL | Freq: Two times a day (BID) | INTRAVENOUS | Status: DC
  Administered 2022-10-27 – 2022-10-28 (×3): 3 mL via INTRAVENOUS

## 2022-10-27 MED ORDER — FENTANYL CITRATE (PF) 100 MCG/2ML IJ SOLN
INTRAMUSCULAR | Status: DC | PRN
  Administered 2022-10-27: 12.5 ug via INTRAVENOUS

## 2022-10-27 MED ORDER — HEPARIN (PORCINE) 25000 UT/250ML-% IV SOLN
1200.0000 [IU]/h | INTRAVENOUS | Status: DC
  Administered 2022-10-27: 1350 [IU]/h via INTRAVENOUS
  Administered 2022-10-28: 1700 [IU]/h via INTRAVENOUS
  Administered 2022-10-29 (×2): 1550 [IU]/h via INTRAVENOUS
  Administered 2022-10-30: 1400 [IU]/h via INTRAVENOUS
  Filled 2022-10-27 (×5): qty 250

## 2022-10-27 MED ORDER — CHLORHEXIDINE GLUCONATE CLOTH 2 % EX PADS
6.0000 | MEDICATED_PAD | Freq: Every day | CUTANEOUS | Status: DC
  Administered 2022-10-27 – 2022-10-29 (×3): 6 via TOPICAL

## 2022-10-27 MED ORDER — SODIUM CHLORIDE 0.9% FLUSH: 3.0000 mL | INTRAVENOUS | Status: DC | PRN

## 2022-10-27 MED ORDER — VERAPAMIL HCL 2.5 MG/ML IV SOLN
INTRAVENOUS | Status: AC
  Filled 2022-10-27: qty 2

## 2022-10-27 MED ORDER — SODIUM CHLORIDE 0.9% FLUSH: 10.0000 mL | INTRAVENOUS | Status: DC | PRN

## 2022-10-27 MED ORDER — MIDAZOLAM HCL 2 MG/2ML IJ SOLN
INTRAMUSCULAR | Status: DC | PRN
  Administered 2022-10-27: .5 mg via INTRAVENOUS

## 2022-10-27 MED ORDER — HEPARIN (PORCINE) IN NACL 2000-0.9 UNIT/L-% IV SOLN
INTRAVENOUS | Status: DC | PRN
  Administered 2022-10-27: 1000 mL

## 2022-10-27 MED ORDER — MILRINONE LACTATE IN DEXTROSE 20-5 MG/100ML-% IV SOLN
0.2500 ug/kg/min | INTRAVENOUS | Status: DC
  Administered 2022-10-27: 0.25 ug/kg/min via INTRAVENOUS
  Filled 2022-10-27 (×2): qty 100

## 2022-10-27 MED ORDER — LIDOCAINE HCL (PF) 1 % IJ SOLN
INTRAMUSCULAR | Status: DC | PRN
  Administered 2022-10-27 (×2): 2 mL

## 2022-10-27 MED ORDER — FENTANYL CITRATE (PF) 100 MCG/2ML IJ SOLN
INTRAMUSCULAR | Status: AC
  Filled 2022-10-27: qty 2

## 2022-10-27 MED ORDER — HEPARIN (PORCINE) IN NACL 1000-0.9 UT/500ML-% IV SOLN
INTRAVENOUS | Status: AC
  Filled 2022-10-27: qty 1000

## 2022-10-27 MED ORDER — HYDRALAZINE HCL 20 MG/ML IJ SOLN: 10.0000 mg | INTRAMUSCULAR | Status: AC | PRN

## 2022-10-27 MED ORDER — MIDAZOLAM HCL 2 MG/2ML IJ SOLN
INTRAMUSCULAR | Status: AC
  Filled 2022-10-27: qty 2

## 2022-10-27 MED ORDER — SODIUM CHLORIDE 0.9 % IV SOLN: INTRAVENOUS | Status: AC

## 2022-10-27 SURGICAL SUPPLY — 12 items
CATH 5FR JL3.5 JR4 ANG PIG MP (CATHETERS) IMPLANT
CATH BALLN WEDGE 5F 110CM (CATHETERS) IMPLANT
DEVICE RAD TR BAND REGULAR (VASCULAR PRODUCTS) IMPLANT
DRAPE BRACHIAL (DRAPES) IMPLANT
GLIDESHEATH SLEND SS 6F .021 (SHEATH) IMPLANT
GUIDEWIRE INQWIRE 1.5J.035X260 (WIRE) IMPLANT
INQWIRE 1.5J .035X260CM (WIRE) ×1
PACK CARDIAC CATH (CUSTOM PROCEDURE TRAY) ×1 IMPLANT
PROTECTION STATION PRESSURIZED (MISCELLANEOUS) ×1
SET ATX-X65L (MISCELLANEOUS) IMPLANT
SHEATH GLIDE SLENDER 4/5FR (SHEATH) IMPLANT
STATION PROTECTION PRESSURIZED (MISCELLANEOUS) IMPLANT

## 2022-10-27 NOTE — Progress Notes (Signed)
Unable to complete assessment at this time as IV team RN is in room inserting PICC line.

## 2022-10-27 NOTE — Telephone Encounter (Signed)
Pharmacy Patient Advocate Encounter   Received notification from  Inpatient  that prior authorization for Jardiance 10mg  is required/requested.   Insurance verification completed.   The patient is insured through [facility] .   Per test claim: PA required; PA submitted to [facility] via CoverMyMeds Key/confirmation #/EOC [id number] Status is pending

## 2022-10-27 NOTE — TOC Benefit Eligibility Note (Signed)
Pharmacy Patient Advocate [facility] verification completed.    The patient is insured through [employer].     Ran test claim for Eliquis and the current 30 day co-pay is $325.10.   This test claim was processed through [facility]- copay amounts may vary at other pharmacies due to pharmacy/plan contracts, or as the patient moves through the different stages of their insurance plan.

## 2022-10-27 NOTE — Progress Notes (Signed)
Peripherally Inserted Central Catheter Placement  The IV Nurse has discussed with the patient and/or persons authorized to consent for the patient, the purpose of this procedure and the potential benefits and risks involved with this procedure.  The benefits include less needle sticks, lab draws from the catheter, and the patient may be discharged home with the catheter. Risks include, but not limited to, infection, bleeding, blood clot (thrombus formation), and puncture of an artery; nerve damage and irregular heartbeat and possibility to perform a PICC exchange if needed/ordered by physician.  Alternatives to this procedure were also discussed.  Bard Power PICC patient education guide, fact sheet on infection prevention and patient information card has been provided to patient /or left at bedside.    PICC Placement Documentation  PICC Double Lumen 10/27/22 Left Basilic 47 cm 2 cm (Active)  Indication for Insertion or Continuance of Line Vasoactive infusions 10/27/22 1400  Exposed Catheter (cm) 2 cm 10/27/22 1400  Site Assessment Clean, Dry, Intact 10/27/22 1400  Lumen #1 Status Flushed;Saline locked;Blood return noted 10/27/22 1400  Lumen #2 Status Flushed;Saline locked;Blood return noted 10/27/22 1400  Dressing Type Transparent;Securing device 10/27/22 1400  Dressing Status Antimicrobial disc in place;Clean, Dry, Intact 10/27/22 1400  Line Care Connections checked and tightened 10/27/22 1400  Line Adjustment (NICU/IV Team Only) No 10/27/22 1400  Dressing Intervention New dressing 10/27/22 1400  Dressing Change Due 11/03/22 10/27/22 1400       Franne Grip Emmett 10/27/2022, 2:08 PM

## 2022-10-27 NOTE — Progress Notes (Signed)
   Patient Name: Calvin Esparza Date of Encounter: 10/27/2022 [facility] Cardiologist: Julien Nordmann, MD   Interval Summary  .    Patient feeling well today without chest pain, shortness of breath, or palpitations.  Vital Signs .    Vitals:   10/27/22 1204 10/27/22 1215 10/27/22 1230 10/27/22 1245  BP: 100/80 101/83 97/82 90/77   Pulse: 89 (!) 102 91 85  Resp: 15 15 20 13   Temp:      TempSrc:      SpO2: 95% 93% 95% 94%  Weight:      Height:        Intake/Output Summary (Last 24 hours) at 10/27/2022 1249 Last data filed at 10/27/2022 0520 Gross per 24 hour  Intake 52.38 ml  Output 1200 ml  Net -1147.62 ml      10/27/2022    3:45 AM 10/26/2022    5:37 AM 10/25/2022    5:00 AM  Last 3 Weights  Weight (lbs) 178 lb 9.2 oz 192 lb 0.3 oz 192 lb 4.7 oz  Weight (kg) 81 kg 87.1 kg 87.224 kg      Telemetry/ECG    Atrial fibrillation- Personally Reviewed  Physical Exam .   GEN: No acute distress.   Neck: No JVD Cardiac: Irregularly irregular rhythm with 1/6 systolic murmur. Respiratory: Clear to auscultation bilaterally. GI: Soft, nontender, non-distended  MS: No edema  Assessment & Plan .     Acute on chronic HFrEF: Mr. Clermont appears euvolemic.  Right and left heart filling pressures were normal and catheterization, though cardiac output was severely reduced. -Transfer to stepdown for initiation of milrinone.  Will begin milrinone infusion at 0.25 mcg/kg/min, with titration as blood pressure and heart rate allow. -PICC line to be placed. -Decrease metoprolol succinate to 12.5 mg daily in the setting of low output heart failure and need for inotropes. -Hold standing diuretic.  Maintain net even fluid balance. -Continue current doses of digoxin, dapagliflozin, and losartan. -Low threshold for transfer to [facility] if his cardiac output does and/or we are unable to wean him from milrinone for further management by the advanced heart failure team.  Permanent  atrial fibrillation: Ventricular rates reasonably well-controlled. -Decrease metoprolol succinate in the setting of low output heart failure.  Avoid nondihydropyridine calcium channel blockers like diltiazem and verapamil. -Continue digoxin. -Consider challenging with a low-dose spironolactone if blood pressure, renal function, and potassium allow. -Resume heparin infusion 2 hours after TR band removal.  Plan to transition back to apixaban once it is clear that no further invasive procedures will be necessary at this admission.  Polysubstance abuse: Patient with history of heavy alcohol use and remote cocaine use. -Continue monitoring for alcohol withdrawal. -Agree with checking urine drug screen.  If the patient is actively using substances, this would limit his candidacy for advanced heart failure treatments.  For questions or updates, please contact  [facility] Please consult [email or web address] for contact info under [facility].     Signed, Yvonne Kendall, MD

## 2022-10-27 NOTE — Consult Note (Signed)
ANTICOAGULATION CONSULT NOTE  Pharmacy Consult for Heparin Infusion Indication: atrial fibrillation  No Known Allergies  Patient Measurements: Height: 5\' 10"  (177.8 cm) Weight: 81 kg (178 lb 9.2 oz) IBW/kg (Calculated) : 73 Heparin Dosing Weight: 88.5 kg  Vital Signs: Temp: 97.9 F (36.6 C) (08/12 0345) Temp Source: Oral (08/12 0345) BP: 88/64 (08/12 0402) Pulse Rate: 97 (08/12 0402)  Labs: Recent Labs    10/24/22 0732 10/24/22 1008 10/25/22 0539 10/26/22 0422 10/27/22 0440  HGB  --   --  13.5  --  14.9  HCT  --   --  40.8  --  44.5  PLT  --   --  179  --  192  HEPARINUNFRC  --    < > 0.42 0.40 0.39  CREATININE  --   --  1.04 1.21 1.08  TROPONINIHS 14  --   --   --   --    < > = values in this interval not displayed.    Estimated Creatinine Clearance: 75.1 mL/min (by C-G formula based on SCr of 1.08 mg/dL).   Medical History: Past Medical History:  Diagnosis Date   Alcohol abuse    Chronic pain    COVID-19    a. 02/2019   HFrEF (heart failure with reduced ejection fraction) (HCC)    a. 05/2019 Echo: EF 35-40%; b. 07/2021 Echo: EF 40-45%; c. 10/2022 Echo: EF 20-25%, glob HK, mildly reduced RV fxn, sev dil LA, mid-mod MR, mild AI.   History of medication noncompliance    Longstanding persistent atrial fibrillation (HCC)    a. Dx 03/2018; b. 05/2019 recurrent Afib in setting of PE. CHA2DS2VASc = 1-->Eliquis later changed to xarelto; b. 07/2019 s/p DCCV (150J); d. 08/2019 admit w/ AF RVR in settting of noncompliance - longstanding persistent afib since on bb/ccb rx.   NICM (nonischemic cardiomyopathy) (HCC)    a. 05/2019 Echo: EF 35-40%; b. 05/2019 MV: EF 32%, no ischemia; c. 07/2021 Echo: EF 40-45%; d. 10/2022 Echo: EF 20-25%, glob HK.   Pulmonary embolism (HCC)    a. 05/2019 CTA Chest: mild amt of PE w/in a lower lobe branch of RPA; b. 08/2019 CTA Chest: Small PE in RML and RLL PA.    Medications:  Patient hasn't been taking Rivaroxaban since January due to insurance  reasons  Also not on any rate control medications for same reason (Digoxin and Metoprolol)  Assessment: Calvin Esparza is a 62 yo old male with a history of afib was on rivaroxaban PTA with RVR and PE not taking any of his medications since January of 2024 due to loss of insurance. Presented to the ED with a CHF exacerbation, dyspnea, peripheral edema and weight gain. Baseline heparin level was < 0.1, so we will monitor heparin using heparin level. CHADS-VASc: 4. Pharmacy consulted for heparin due to afib with history of PE.   Baseline Labs: aPTT 27, HL <0.10, PT 15.1, INR 1.2, Hgb 14.3, Hct 43.5, Plt 228   Goal of Therapy:  Heparin level 0.3-0.7 units/ml Monitor platelets by anticoagulation protocol: Yes   Date Time HL Rate/Comment  8/9 1153 0.44 1350/therapeutic x1 8/9 1808 0.52 1350/therapeutic x2 8/10 0539 0.42 1350/therapeutic x3 8/11 0422 0.40 1350/therapeutic x 4 8/12 0440 0.39 1350/therapeutic x 5   Plan:  Continue heparin infusion at 1350 units/hr Cath procedure scheduled for 1330 Pharmacy to f/u anticoag plan post op Continue to monitor H&H and platelets daily while on heparin infusion   Otelia Sergeant, PharmD, [facility] 10/27/2022 6:50  AM

## 2022-10-27 NOTE — Progress Notes (Signed)
[facility] HF Stewardship  PCP: Lauro Regulus, MD  PCP-Cardiologist: Julien Nordmann, MD  HPI: Calvin Esparza is a 62 y.o. male with CHF, HTN, HLD, gout, PE and PAF on Xarelto, alcohol abuse, presented with worsening of fatigue, exertional dyspnea, and increasing leg swelling. Echo in 2021 showed LVEF of 35-40%. LVEF increased to 40-45% in 07/2021. New echo pending. Patient has been off HF medications since January due to cost and experienced progression of symptoms starting in June. BNP 910 on admission. Diuresed with IV Lasix. Transitioned to PO diuretic after mild creatine bump and symptom improvement. Undergoing LHC on 10/27/22.  Pertinent Lab Values: BUN  Date Value Ref Range Status  10/27/2022 21 (H) 6 - 20 mg/dL Final  16/12/9602 22 6 - 24 mg/dL Final   Potassium  Date Value Ref Range Status  10/27/2022 3.9 3.5 - 5.1 mmol/L Final   Sodium  Date Value Ref Range Status  10/27/2022 138 135 - 145 mmol/L Final  12/03/2020 140 134 - 144 mmol/L Final   B Natriuretic Peptide  Date Value Ref Range Status  10/24/2022 910.5 (H) 0.0 - 100.0 pg/mL Final    Comment:    Performed at [facility], [address] [place], [place] [address]   Magnesium  Date Value Ref Range Status  10/08/2021 2.4 1.7 - 2.4 mg/dL Final    Comment:    Performed at [facility], [address] [place], [place] [address]   TSH  Date Value Ref Range Status  02/25/2020 1.449 0.350 - 4.500 uIU/mL Final    Comment:    Performed by a 3rd Generation assay with a functional sensitivity of <=0.01 uIU/mL. Performed at [facility], [address] [place], [place] [address]     Vital Signs: Temp:  [97.2 F (36.2 C)-99.1 F (37.3 C)] 97.8 F (36.6 C) (08/12 0948) Pulse Rate:  [77-119] 95 (08/12 1010) Cardiac Rhythm: Atrial flutter (08/12 0948) Resp:  [13-20] 13 (08/12 1010) BP: (84-110)/(64-79) 106/79 (08/12 0948) SpO2:  [90 %-97 %] 90 % (08/12 1010) Weight:  [81 kg (178  lb 9.2 oz)] 81 kg (178 lb 9.2 oz) (08/12 0345)   Intake/Output Summary (Last 24 hours) at 10/27/2022 1145 Last data filed at 10/27/2022 0520 Gross per 24 hour  Intake 52.38 ml  Output 1200 ml  Net -1147.62 ml    Current HF Medications:  Metoprolol tartrate 50 mg BID Farxiga 10 mg daily Digoxin 0.125 mg daily Furosemide 40 mg BID  Prior to admission HF Medications:  none  Assessment: 1. Acute on chronic systolic heart failure (LVEF in 2023 40-45%), due to NICM. NYHA class III-IV symptoms.  -Respiratory symptoms significantly improved. SBP ~90-10s1, HR variable. Remains in Afib. Metoprolol and digoxin added for HF and Afib RVR. -Wt at home ~195, now down to 178, but suspect the difference in standing and bed weights. No LEE on exam. Agree with IV Lasix. Renal function at baseline.   Plan: 1) Medication changes recommended at this time: -Consider swapping from [place] to [place] due to insurance formulary.  2) Patient assistance: -Patient qualifies for copay cards with commercial insurance.  -Deductible is $7,500, therefore copay cards are essential before discharge. Entresto copay cards help cover deductible. Sherryll Burger copay: $656.56 -Jardiance requires prior authorization -Xarelto copay: $543.58   3) Education: -To be completed prior to discharge.  Medication Assistance / Insurance Benefits Check:  Does the patient have prescription insurance? Prescription Insurance: Commercial ([employer])  Does the patient qualify for medication assistance through  manufacturers or grants? Pending   Eligible grants and/or patient assistance programs: pending   Medication assistance applications in progress: pending   Medication assistance applications approved: pending  Outpatient Pharmacy:  Prior to admission outpatient pharmacy: none  Is the patient willing to use [facility] pharmacy at discharge? Yes  Is the patient willing to transition their outpatient  pharmacy to utilize a [facility] outpatient pharmacy?   TBD  Thank you for involving pharmacy in this patient's care.  Enos Fling, PharmD, BCPS Clinical Pharmacist 10/27/2022 11:45 AM

## 2022-10-27 NOTE — Progress Notes (Signed)
PROGRESS NOTE    Calvin Esparza   JWJ:[id number] DOB: 07/18/1960  DOA: 10/24/2022 Date of Service: 10/27/22 PCP: Lauro Regulus, MD     Brief Narrative / Hospital Course:  Calvin Esparza is a 62 y.o. male with medical history significant of chronic HFrEF with LVEF 30-40%, HTN, HLD, gout, PE and PAF on Xarelto, alcohol abuse, presented with worsening of fatigue, exertional dyspnea, and increasing leg swelling. Patient stopped taking all his medications since January this year due to insurance coverage issue. Started about 2 months ago, patient started to feel fatigued, exertional dyspnea and gradually getting worse.  08/09: to ED. chest x-ray showed cardiomegaly and pulmonary congestion, CT angiogram negative for PE. Creatinine 1.2, K3.8, WBC 7.2, hemoglobin 14.3. HR 170's. Tx w/ po metoprolol and IV lasix. Admitted for CHF, Afib.  08/10: HR remains elevated 110s. BP improved. Net IO Since Admission: -3L. Cr 1.04. Echo reviewed - EF 20-25%, global hypokinesis, indeterminate diastolic parameters, RV fxn reduced, mild pulm HTN, RA and LA dilation, mild/mod MR (compared to Echo 07/2021 w/ EF 40-45%). Cardio consult - plan for cardiac cath.  08/11: pending cath, continue diuresis, Net IO Since Admission: -6L 08/12: Net IO Since Admission: -7.5L. Cardiac cath showing significantly reduced cardiac output. Transferring to SDU for milrinone gtt    Consultants:  Cardiology   Procedures: 10/27/22 cardiac cath       ASSESSMENT & PLAN:   Principal Problem:   CHF (congestive heart failure) (HCC) Active Problems:   Atrial fibrillation with RVR (HCC)   Acute on chronic combined systolic (congestive) and diastolic (congestive) heart failure (HCC)    Acute on chronic HFrEF decompensation Pulmonary HTN Severe reduced CO on cath  Secondary to medication noncompliance Transfer to SDU for milrinone gtt  Diuresis held for now Strict I&O, fluid restriction diet  Beta blocker -  metoprolol succinate 12.5 mg daily  Farxiga Consider challenging with a low-dose spironolactone if blood pressure, renal function, and potassium allow.  Low threshold for transfer to [facility] if his cardiac output does and/or we are unable to wean him from milrinone for further management by the advanced heart failure team.   Afib RVR - RVR resolved, remains elevated HR 110s Heparin gtt, plan to transition back to apixaban once it is clear that no further invasive procedures will be necessary at this admission.  Previously on diltiazem, will avoid in CHF  Metoprolol succinate 12.5 mg daily  Digoxin Telemetry until rate improved/stable  Cardiology following    History of cocaine abuse UDS ordered, pending   Alcohol abuse No symptoms or signs of active withdrawal CIWA protocol with as needed benzo Thiamine, Folic acid    History of PE CTA Chest negative this admission for PE Anticoag as above see Afib   DVT prophylaxis: currently heparin gtt, plan transition to anticoagulation following cardaic cath  Pertinent IV fluids/nutrition: no continuous IV fluids w/ CHF, fluid restricted cardiac diet  Central lines / invasive devices: none  Code Status: FULL CODE ACP documentation reviewed: 10/25/22 none on file   Current Admission Status: inpatient   TOC needs / Dispo plan: med assistance, other TBD Barriers to discharge / significant pending items: to SDU today for milrinone gtt, anticipate here several more days, possible transfer [facility] per cardiology              Subjective / Brief ROS:  Patient reports doing okay this morning  Examined in specials recovery following cath Denies CP.  SOB still present  but better than yesterday Pain controlled.  Denies new weakness.  Reports no concerns w/ urination/defecation.   Family Communication: none at this time     Objective Findings:  Vitals:   10/27/22 1230 10/27/22 1245 10/27/22 1300 10/27/22 1315  BP: 97/82  90/77 115/84 98/77  Pulse: 91 85 90 83  Resp: 20 13 17 15   Temp:      TempSrc:      SpO2: 95% 94% 94% 95%  Weight:      Height:        Intake/Output Summary (Last 24 hours) at 10/27/2022 1349 Last data filed at 10/27/2022 0520 Gross per 24 hour  Intake 52.38 ml  Output 1200 ml  Net -1147.62 ml   Filed Weights   10/25/22 0500 10/26/22 0537 10/27/22 0345  Weight: 87.2 kg 87.1 kg 81 kg    Examination:  Physical Exam Constitutional:      General: He is not in acute distress. Cardiovascular:     Rate and Rhythm: Normal rate. Rhythm irregular.     Heart sounds: Murmur heard.     Systolic murmur is present.  Pulmonary:     Effort: Pulmonary effort is normal.  Abdominal:     Palpations: Abdomen is soft.  Skin:    General: Skin is warm and dry.  Neurological:     General: No focal deficit present.     Mental Status: He is alert and oriented to person, place, and time.  Psychiatric:        Mood and Affect: Mood normal.        Behavior: Behavior normal.          Scheduled Medications:   [MAR Hold] dapagliflozin propanediol  10 mg Oral QAC breakfast   [MAR Hold] digoxin  0.125 mg Oral Daily   [MAR Hold] folic acid  1 mg Oral Daily   [MAR Hold] losartan  12.5 mg Oral Daily   [START ON 10/28/2022] metoprolol succinate  12.5 mg Oral Daily   [MAR Hold] multivitamin with minerals  1 tablet Oral Daily   [MAR Hold] polyethylene glycol  17 g Oral Daily   [MAR Hold] sodium chloride flush  3 mL Intravenous Q12H   sodium chloride flush  3 mL Intravenous Q12H   [MAR Hold] thiamine  100 mg Oral Daily   Or   [MAR Hold] thiamine  100 mg Intravenous Daily    Continuous Infusions:  [MAR Hold] sodium chloride     sodium chloride     heparin     milrinone      PRN Medications:  [MAR Hold] sodium chloride, sodium chloride, [MAR Hold] acetaminophen, hydrALAZINE, [MAR Hold] ondansetron (ZOFRAN) IV, [MAR Hold] senna-docusate, [MAR Hold] sodium chloride flush, sodium chloride flush,  [MAR Hold] traZODone  Antimicrobials from admission:  Anti-infectives (From admission, onward)    None           Data Reviewed:  I have personally reviewed the following...  CBC: Recent Labs  Lab 10/24/22 0513 10/25/22 0539 10/27/22 0440 10/27/22 1129 10/27/22 1132  WBC 7.2 5.1 6.1  --   --   HGB 14.3 13.5 14.9 16.3 16.7  HCT 43.5 40.8 44.5 48.0 49.0  MCV 87.9 86.4 85.6  --   --   PLT 228 179 192  --   --    Basic Metabolic Panel: Recent Labs  Lab 10/24/22 0513 10/25/22 0539 10/26/22 0422 10/27/22 0440 10/27/22 1129 10/27/22 1132  NA 141 137 134* 135 140 138  K 3.8  3.3* 3.5 3.6 3.8 3.9  CL 102 99 98 99  --   --   CO2 26 26 25 27   --   --   GLUCOSE 160* 99 103* 101*  --   --   BUN 22* 20 23* 21*  --   --   CREATININE 1.20 1.04 1.21 1.08  --   --   CALCIUM 9.1 8.6* 8.8* 9.0  --   --    GFR: Estimated Creatinine Clearance: 75.1 mL/min (by C-G formula based on SCr of 1.08 mg/dL). Liver Function Tests: No results for input(s): "AST", "ALT", "ALKPHOS", "BILITOT", "PROT", "ALBUMIN" in the last 168 hours. No results for input(s): "LIPASE", "AMYLASE" in the last 168 hours. No results for input(s): "AMMONIA" in the last 168 hours. Coagulation Profile: Recent Labs  Lab 10/24/22 0543  INR 1.2   Cardiac Enzymes: No results for input(s): "CKTOTAL", "CKMB", "CKMBINDEX", "TROPONINI" in the last 168 hours. BNP (last 3 results) No results for input(s): "PROBNP" in the last 8760 hours. HbA1C: No results for input(s): "HGBA1C" in the last 72 hours.  CBG: Recent Labs  Lab 10/24/22 1143 10/24/22 1747 10/24/22 2201 10/25/22 0809 10/25/22 1603  GLUCAP 106* 122* 127* 94 106*   Lipid Profile: No results for input(s): "CHOL", "HDL", "LDLCALC", "TRIG", "CHOLHDL", "LDLDIRECT" in the last 72 hours. Thyroid Function Tests: No results for input(s): "TSH", "T4TOTAL", "FREET4", "T3FREE", "THYROIDAB" in the last 72 hours. Anemia Panel: No results for input(s):  "VITAMINB12", "FOLATE", "FERRITIN", "TIBC", "IRON", "RETICCTPCT" in the last 72 hours. Most Recent Urinalysis On File:  No results found for: "COLORURINE", "APPEARANCEUR", "LABSPEC", "PHURINE", "GLUCOSEU", "HGBUR", "BILIRUBINUR", "KETONESUR", "PROTEINUR", "UROBILINOGEN", "NITRITE", "LEUKOCYTESUR" Sepsis Labs: @LABRCNTIP (procalcitonin:4,lacticidven:4) Microbiology: No results found for this or any previous visit (from the past 240 hour(s)).    Radiology Studies last 3 days: [place] EKG Site Rite  Result Date: 10/27/2022 If [facility] image not attached, placement could not be confirmed due to current cardiac rhythm.  CARDIAC CATHETERIZATION  Result Date: 10/27/2022 Conclusions: No angiographically significant coronary artery disease. Upper normal left and right heart filling pressures. Severely reduced Fick cardiac output/index.  Low PAPi suggests a component of right heart failure as well. Recommendations: Transfer to stepdown and initiate milrinone therapy. Decrease metoprolol succinate in the setting of low-output heart failure. Maintain net even fluid balance. If cardiac output cannot be improved and/or milrinone cannot be weaned off, will need to consider transfer to [facility] for further management by the advanced heart failure team. Yvonne Kendall, MD [facility]  DG Chest 1 View  Result Date: 10/25/2022 CLINICAL DATA:  40981 with CHF. EXAM: CHEST  1 VIEW COMPARISON:  CTA chest yesterday at 6:15 a.m., portable chest yesterday at 5:08 a.m. FINDINGS: 7:01 a.m. Moderate cardiomegaly. There continues to be mild central vascular prominence but no overt edema. The lungs are clear of infiltrates. There is no substantial pleural effusion. The mediastinum is stable with lipomatosis and mild aortic tortuosity and atherosclerosis. No new osseous abnormality. Thoracic spondylosis. Overlying monitor wires. IMPRESSION: Cardiomegaly with mild central vascular prominence but no overt edema or evidence of  pneumonia. No significant change from yesterday's study. Electronically Signed   By: Almira Bar M.D.   On: 10/25/2022 07:46   ECHOCARDIOGRAM COMPLETE  Result Date: 10/24/2022    ECHOCARDIOGRAM REPORT   Patient Name:   JULE DESTEFANO Date of Exam: 10/24/2022 Medical Rec #:  [id number]        Height:       70.0 in Accession #:  [id number]       Weight:       195.0 lb Date of Birth:  [birth year]       BSA:          2.065 m Patient Age:    61 years         BP:           118/106 mmHg Patient Gender: M                HR:           107 bpm. Exam Location:  [facility] Procedure: 2D Echo, Cardiac Doppler, Color Doppler and Intracardiac            Opacification Agent Indications:     CHF  History:         Patient has prior history of Echocardiogram examinations, most                  recent 07/18/2021. CHF and Cardiomyopathy, Arrythmias:Atrial                  Fibrillation; Risk Factors:Hypertension and Dyslipidemia. ETOH                  abuse.  Sonographer:     Mikki Harbor Referring Phys:  [id number] Emeline General Diagnosing Phys: Debbe Odea MD IMPRESSIONS  1. Left ventricular ejection fraction, by estimation, is 20 to 25%. Left ventricular ejection fraction by PLAX is 23 %. The left ventricle has severely decreased function. The left ventricle demonstrates global hypokinesis. Left ventricular diastolic parameters are indeterminate.  2. Right ventricular systolic function is mildly reduced. The right ventricular size is mildly enlarged. There is mildly elevated pulmonary artery systolic pressure.  3. Left atrial size was severely dilated.  4. Right atrial size was mildly dilated.  5. The mitral valve is normal in structure. Mild to moderate mitral valve regurgitation.  6. The aortic valve is tricuspid. Aortic valve regurgitation is mild.  7. The inferior vena cava is dilated in size with >50% respiratory variability, suggesting right atrial pressure of 8 mmHg. FINDINGS  Left Ventricle: Left ventricular ejection  fraction, by estimation, is 20 to 25%. Left ventricular ejection fraction by PLAX is 23 %. The left ventricle has severely decreased function. The left ventricle demonstrates global hypokinesis. Definity contrast agent was given IV to delineate the left ventricular endocardial borders. The left ventricular internal cavity size was normal in size. There is no left ventricular hypertrophy. Left ventricular diastolic parameters are indeterminate. Right Ventricle: The right ventricular size is mildly enlarged. No increase in right ventricular wall thickness. Right ventricular systolic function is mildly reduced. There is mildly elevated pulmonary artery systolic pressure. The tricuspid regurgitant  velocity is 2.68 m/s, and with an assumed right atrial pressure of 15 mmHg, the estimated right ventricular systolic pressure is 43.7 mmHg. Left Atrium: Left atrial size was severely dilated. Right Atrium: Right atrial size was mildly dilated. Pericardium: There is no evidence of pericardial effusion. Mitral Valve: The mitral valve is normal in structure. Mild to moderate mitral valve regurgitation. MV peak gradient, 3.0 mmHg. The mean mitral valve gradient is 1.0 mmHg. Tricuspid Valve: The tricuspid valve is normal in structure. Tricuspid valve regurgitation is mild. Aortic Valve: The aortic valve is tricuspid. Aortic valve regurgitation is mild. Aortic valve mean gradient measures 2.0 mmHg. Aortic valve peak gradient measures 3.2 mmHg. Aortic valve area, by VTI measures 1.98 cm. Pulmonic Valve: The pulmonic valve was not well visualized.  Pulmonic valve regurgitation is not visualized. Aorta: The aortic root and ascending aorta are structurally normal, with no evidence of dilitation. Venous: The inferior vena cava is dilated in size with greater than 50% respiratory variability, suggesting right atrial pressure of 8 mmHg. IAS/Shunts: No atrial level shunt detected by color flow Doppler.  LEFT VENTRICLE PLAX 2D LV EF:          Left ventricular ejection fraction by PLAX is 23 %. LVIDd:         5.70 cm LVIDs:         5.10 cm LV PW:         0.80 cm LV IVS:        1.10 cm LVOT diam:     2.00 cm LV SV:         31 LV SV Index:   15 LVOT Area:     3.14 cm  RIGHT VENTRICLE RV Basal diam:  4.70 cm RV Mid diam:    3.40 cm RV S prime:     12.30 cm/s LEFT ATRIUM              Index        RIGHT ATRIUM           Index LA diam:        5.10 cm  2.47 cm/m   RA Area:     24.00 cm LA Vol (A2C):   116.0 ml 56.17 ml/m  RA Volume:   78.60 ml  38.06 ml/m LA Vol (A4C):   149.0 ml 72.16 ml/m LA Biplane Vol: 141.0 ml 68.28 ml/m  AORTIC VALVE                    PULMONIC VALVE AV Area (Vmax):    2.14 cm     PV Vmax:       0.68 m/s AV Area (Vmean):   1.93 cm     PV Peak grad:  1.8 mmHg AV Area (VTI):     1.98 cm AV Vmax:           90.10 cm/s AV Vmean:          61.100 cm/s AV VTI:            0.159 m AV Peak Grad:      3.2 mmHg AV Mean Grad:      2.0 mmHg LVOT Vmax:         61.25 cm/s LVOT Vmean:        37.450 cm/s LVOT VTI:          0.100 m LVOT/AV VTI ratio: 0.63  AORTA Ao Root diam: 3.40 cm Ao Asc diam:  3.30 cm MITRAL VALVE               TRICUSPID VALVE MV Area (PHT): 4.68 cm    TR Peak grad:   28.7 mmHg MV Area VTI:   2.08 cm    TR Vmax:        268.00 cm/s MV Peak grad:  3.0 mmHg MV Mean grad:  1.0 mmHg    SHUNTS MV Vmax:       0.86 m/s    Systemic VTI:  0.10 m MV Vmean:      50.3 cm/s   Systemic Diam: 2.00 cm MV Decel Time: 162 msec MV E velocity: 57.10 cm/s Debbe Odea MD Electronically signed by Debbe Odea MD Signature Date/Time: 10/24/2022/4:08:29 PM    Final    CT Angio Chest  PE W/Cm &/Or Wo Cm  Result Date: 10/24/2022 CLINICAL DATA:  62 year old male with history of mid chest pain and shortness of breath. EXAM: CT ANGIOGRAPHY CHEST WITH CONTRAST TECHNIQUE: Multidetector CT imaging of the chest was performed using the standard protocol during bolus administration of intravenous contrast. Multiplanar CT image reconstructions and MIPs  were obtained to evaluate the vascular anatomy. RADIATION DOSE REDUCTION: This exam was performed according to the departmental dose-optimization program which includes automated exposure control, adjustment of the mA and/or kV according to patient size and/or use of iterative reconstruction technique. CONTRAST:  75mL OMNIPAQUE IOHEXOL 350 MG/ML SOLN COMPARISON:  Chest CTA 09/08/2019. FINDINGS: Cardiovascular: No filling defects are noted within the pulmonary arterial tree to suggest pulmonary embolism. Heart size is enlarged with left atrial dilatation. There is no significant pericardial fluid, thickening or pericardial calcification. No atherosclerotic calcifications are noted in the thoracic aorta or the coronary arteries. Mediastinum/Nodes: Multiple prominent borderline enlarged mediastinal and bilateral hilar lymph nodes are noted, nonspecific. Mildly enlarged right paratracheal lymph node (axial image 41 of series 4) currently measuring 1.6 cm in short axis (previously 1.3 cm on 09/08/2019). Esophagus is unremarkable in appearance. No axillary lymphadenopathy. Lungs/Pleura: No acute consolidative airspace disease. No pleural effusions. No suspicious appearing pulmonary nodules or masses are noted. Upper Abdomen: Aortic atherosclerosis. Musculoskeletal: There are no aggressive appearing lytic or blastic lesions noted in the visualized portions of the skeleton. Review of the MIP images confirms the above findings. IMPRESSION: 1. No acute findings are noted in the thorax to account for the patient's symptoms. Specifically, no evidence of pulmonary embolism. 2. Cardiomegaly with left atrial dilatation. 3. Mildly enlarged right paratracheal lymph node, only minimally increased in size compared to prior examination from 09/08/2019. This is nonspecific and favored to be benign. Follow-up contrast-enhanced chest CT is recommended in 6 months to ensure the stability or regression of this finding. Electronically Signed    By: Trudie Reed M.D.   On: 10/24/2022 06:29   DG Chest Port 1 View  Result Date: 10/24/2022 CLINICAL DATA:  62 year old male with history of chest pain. EXAM: PORTABLE CHEST 1 VIEW COMPARISON:  Chest x-ray 07/17/2021. FINDINGS: Lung volumes are normal. No consolidative airspace disease. No pleural effusions. No pneumothorax. No evidence of pulmonary edema. Heart size is mildly enlarged. The patient is rotated to the right on today's exam, resulting in distortion of the mediastinal contours and reduced diagnostic sensitivity and specificity for mediastinal pathology. IMPRESSION: 1. No radiographic evidence of acute cardiopulmonary disease. 2. Mild cardiomegaly. Electronically Signed   By: Trudie Reed M.D.   On: 10/24/2022 05:30             LOS: 3 days   .  Sunnie Nielsen, DO [facility] 10/27/2022, 1:49 PM    Dictation software may have been used to generate the above note. Typos may occur and escape review in typed/dictated notes. Please contact Dr Lyn Hollingshead directly for clarity if needed.  Staff may message me via secure chat in Epic  but this may not receive an immediate response,  please page me for urgent matters!  If 7PM-7AM, please contact night coverage [email or web address]

## 2022-10-27 NOTE — Progress Notes (Signed)
Pt arrived to unit at this time. Pt is alert and oriented X4. Pt's VSS on arrival, on room air. Milrinone gtt infusing through PIV, waiting on xray confirmation for PICC line placement.

## 2022-10-27 NOTE — Interval H&P Note (Signed)
History and Physical Interval Note:  10/27/2022 10:44 AM  Calvin Esparza  has presented today for surgery, with the diagnosis of congestive heart failure and unstable angina.  The various methods of treatment have been discussed with the patient and family. After consideration of risks, benefits and other options for treatment, the patient has consented to  Procedure(s): RIGHT/LEFT HEART CATH AND CORONARY ANGIOGRAPHY (N/A) as a surgical intervention.  The patient's history has been reviewed, patient examined, no change in status, stable for surgery.  I have reviewed the patient's chart and labs.  Questions were answered to the patient's satisfaction.     Cath Lab Visit (complete for each Cath Lab visit)  Clinical Evaluation Leading to the Procedure:   ACS: Yes.    Non-ACS:    Cristal Deer 

## 2022-10-27 NOTE — Consult Note (Signed)
ANTICOAGULATION CONSULT NOTE  Pharmacy Consult for Heparin Infusion Indication: atrial fibrillation  No Known Allergies  Patient Measurements: Height: 5\' 10"  (177.8 cm) Weight: 81 kg (178 lb 9.2 oz) IBW/kg (Calculated) : 73 Heparin Dosing Weight: 88.5 kg  Vital Signs: Temp: 97.9 F (36.6 C) (08/12 2000) Temp Source: Oral (08/12 2000) BP: 106/60 (08/12 2300) Pulse Rate: 97 (08/12 2300)  Labs: Recent Labs    10/25/22 0539 10/26/22 0422 10/27/22 0440 10/27/22 1129 10/27/22 1132 10/27/22 2259  HGB 13.5  --  14.9 16.3 16.7  --   HCT 40.8  --  44.5 48.0 49.0  --   PLT 179  --  192  --   --   --   HEPARINUNFRC 0.42 0.40 0.39  --   --  0.16*  CREATININE 1.04 1.21 1.08  --   --   --     Estimated Creatinine Clearance: 75.1 mL/min (by C-G formula based on SCr of 1.08 mg/dL).   Medical History: Past Medical History:  Diagnosis Date   Alcohol abuse    Chronic pain    COVID-19    a. 02/2019   HFrEF (heart failure with reduced ejection fraction) (HCC)    a. 05/2019 Echo: EF 35-40%; b. 07/2021 Echo: EF 40-45%; c. 10/2022 Echo: EF 20-25%, glob HK, mildly reduced RV fxn, sev dil LA, mid-mod MR, mild AI.   History of medication noncompliance    Longstanding persistent atrial fibrillation (HCC)    a. Dx 03/2018; b. 05/2019 recurrent Afib in setting of PE. CHA2DS2VASc = 1-->Eliquis later changed to xarelto; b. 07/2019 s/p DCCV (150J); d. 08/2019 admit w/ AF RVR in settting of noncompliance - longstanding persistent afib since on bb/ccb rx.   NICM (nonischemic cardiomyopathy) (HCC)    a. 05/2019 Echo: EF 35-40%; b. 05/2019 MV: EF 32%, no ischemia; c. 07/2021 Echo: EF 40-45%; d. 10/2022 Echo: EF 20-25%, glob HK.   Pulmonary embolism (HCC)    a. 05/2019 CTA Chest: mild amt of PE w/in a lower lobe branch of RPA; b. 08/2019 CTA Chest: Small PE in RML and RLL PA.    Medications:  Patient hasn't been taking Rivaroxaban since January due to insurance reasons  Also not on any rate control  medications for same reason (Digoxin and Metoprolol)  Assessment: Calvin Esparza is a 61 y.o. male with a history of afib was on rivaroxaban PTA with RVR and PE not taking any of his medications since January of 2024 due to loss of insurance. Presented to the ED with a CHF exacerbation, dyspnea, peripheral edema and weight gain. Baseline heparin level was < 0.10, so we will monitor heparin using heparin level. CHADS-VASc: 4. Pharmacy consulted for initiate and manage heparin infusion.   Baseline Labs: aPTT 27, HL <0.10, PT 15.1, INR 1.2, Hgb 14.3, Hct 43.5, Plt 228   Goal of Therapy:  Heparin level 0.3-0.7 units/ml Monitor platelets by anticoagulation protocol: Yes   Date Time HL Rate/Comment  8/9 1153 0.44 1350/therapeutic x1 8/9 1808 0.52 1350/therapeutic x2 8/10 0539 0.42 1350/therapeutic  8/11 0422 0.40 1350/therapeutic  8/12 0440 0.39 1350/therapeutic  8/12     2259    0.16    1350/SUBtherapeutic   Plan:  8/12:  HL @ 2259 = 0.16, SUBtherapeutic - Will order heparin 2650 units IV X 1 bolus and increase drip rate to 1700 units/hr.  - Will recheck HL 6 hrs after rate change  Continue to monitor H&H and platelets daily while on heparin infusion  Scherrie Gerlach, PharmD Clinical Pharmacist 10/27/2022 11:27 PM

## 2022-10-27 NOTE — Consult Note (Signed)
ANTICOAGULATION CONSULT NOTE  Pharmacy Consult for Heparin Infusion Indication: atrial fibrillation  No Known Allergies  Patient Measurements: Height: 5\' 10"  (177.8 cm) Weight: 81 kg (178 lb 9.2 oz) IBW/kg (Calculated) : 73 Heparin Dosing Weight: 88.5 kg  Vital Signs: Temp: 97.8 F (36.6 C) (08/12 0948) Temp Source: Oral (08/12 0948) BP: 90/77 (08/12 1245) Pulse Rate: 85 (08/12 1245)  Labs: Recent Labs    10/25/22 0539 10/26/22 0422 10/27/22 0440 10/27/22 1129 10/27/22 1132  HGB 13.5  --  14.9 16.3 16.7  HCT 40.8  --  44.5 48.0 49.0  PLT 179  --  192  --   --   HEPARINUNFRC 0.42 0.40 0.39  --   --   CREATININE 1.04 1.21 1.08  --   --     Estimated Creatinine Clearance: 75.1 mL/min (by C-G formula based on SCr of 1.08 mg/dL).   Medical History: Past Medical History:  Diagnosis Date   Alcohol abuse    Chronic pain    COVID-19    a. 02/2019   HFrEF (heart failure with reduced ejection fraction) (HCC)    a. 05/2019 Echo: EF 35-40%; b. 07/2021 Echo: EF 40-45%; c. 10/2022 Echo: EF 20-25%, glob HK, mildly reduced RV fxn, sev dil LA, mid-mod MR, mild AI.   History of medication noncompliance    Longstanding persistent atrial fibrillation (HCC)    a. Dx 03/2018; b. 05/2019 recurrent Afib in setting of PE. CHA2DS2VASc = 1-->Eliquis later changed to xarelto; b. 07/2019 s/p DCCV (150J); d. 08/2019 admit w/ AF RVR in settting of noncompliance - longstanding persistent afib since on bb/ccb rx.   NICM (nonischemic cardiomyopathy) (HCC)    a. 05/2019 Echo: EF 35-40%; b. 05/2019 MV: EF 32%, no ischemia; c. 07/2021 Echo: EF 40-45%; d. 10/2022 Echo: EF 20-25%, glob HK.   Pulmonary embolism (HCC)    a. 05/2019 CTA Chest: mild amt of PE w/in a lower lobe branch of RPA; b. 08/2019 CTA Chest: Small PE in RML and RLL PA.    Medications:  Patient hasn't been taking Rivaroxaban since January due to insurance reasons  Also not on any rate control medications for same reason (Digoxin and  Metoprolol)  Assessment: Calvin Esparza is a 62 y.o. male with a history of afib was on rivaroxaban PTA with RVR and PE not taking any of his medications since January of 2024 due to loss of insurance. Presented to the ED with a CHF exacerbation, dyspnea, peripheral edema and weight gain. Baseline heparin level was < 0.10, so we will monitor heparin using heparin level. CHADS-VASc: 4. Pharmacy consulted for initiate and manage heparin infusion.   Baseline Labs: aPTT 27, HL <0.10, PT 15.1, INR 1.2, Hgb 14.3, Hct 43.5, Plt 228   Goal of Therapy:  Heparin level 0.3-0.7 units/ml Monitor platelets by anticoagulation protocol: Yes   Date Time HL Rate/Comment  8/9 1153 0.44 1350/therapeutic x1 8/9 1808 0.52 1350/therapeutic x2 8/10 0539 0.42 1350/therapeutic  8/11 0422 0.40 1350/therapeutic  8/12 0440 0.39 1350/therapeutic   Plan:  Resume heparin infusion at 1350 units/hr 2 hours after TR band removal  Check HL 6 hours after resuming heparin infusion Continue to monitor H&H and platelets daily while on heparin infusion   Celene Squibb, PharmD Clinical Pharmacist 10/27/2022 1:05 PM

## 2022-10-28 ENCOUNTER — Encounter: Payer: Self-pay | Admitting: Internal Medicine

## 2022-10-28 ENCOUNTER — Other Ambulatory Visit (HOSPITAL_COMMUNITY): Payer: Self-pay

## 2022-10-28 DIAGNOSIS — I5023 Acute on chronic systolic (congestive) heart failure: Secondary | ICD-10-CM | POA: Diagnosis not present

## 2022-10-28 DIAGNOSIS — I5043 Acute on chronic combined systolic (congestive) and diastolic (congestive) heart failure: Secondary | ICD-10-CM | POA: Diagnosis not present

## 2022-10-28 LAB — HEPARIN LEVEL (UNFRACTIONATED)
Heparin Unfractionated: 0.7 IU/mL (ref 0.30–0.70)
Heparin Unfractionated: 0.75 IU/mL — ABNORMAL HIGH (ref 0.30–0.70)
Heparin Unfractionated: 0.67 IU/mL (ref 0.30–0.70)

## 2022-10-28 LAB — COOXEMETRY PANEL
Carboxyhemoglobin: 1.8 % — ABNORMAL HIGH (ref 0.5–1.5)
Methemoglobin: 1 % (ref 0.0–1.5)
O2 Saturation: 64.1 %
Total hemoglobin: 14.7 g/dL (ref 12.0–16.0)
Total oxygen content: 62.3 %

## 2022-10-28 MED ORDER — COLCHICINE 0.6 MG PO TABS
0.6000 mg | ORAL_TABLET | Freq: Two times a day (BID) | ORAL | Status: DC
  Administered 2022-10-29 – 2022-10-31 (×6): 0.6 mg via ORAL
  Filled 2022-10-28 (×6): qty 1

## 2022-10-28 MED ORDER — FUROSEMIDE 20 MG PO TABS
20.0000 mg | ORAL_TABLET | Freq: Every day | ORAL | Status: DC
  Administered 2022-10-28 – 2022-10-31 (×4): 20 mg via ORAL
  Filled 2022-10-28 (×4): qty 1

## 2022-10-28 MED ORDER — LOSARTAN POTASSIUM 50 MG PO TABS
25.0000 mg | ORAL_TABLET | Freq: Every day | ORAL | Status: DC
  Administered 2022-10-28: 25 mg via ORAL

## 2022-10-28 MED ORDER — SPIRONOLACTONE 12.5 MG HALF TABLET
12.5000 mg | ORAL_TABLET | Freq: Every day | ORAL | Status: DC
  Administered 2022-10-28 – 2022-10-29 (×2): 12.5 mg via ORAL
  Filled 2022-10-28 (×2): qty 1

## 2022-10-28 MED ORDER — METHYLPREDNISOLONE SODIUM SUCC 125 MG IJ SOLR
60.0000 mg | Freq: Once | INTRAMUSCULAR | Status: AC
  Administered 2022-10-29: 60 mg via INTRAVENOUS
  Filled 2022-10-28: qty 2

## 2022-10-28 NOTE — Consult Note (Signed)
ANTICOAGULATION CONSULT NOTE  Pharmacy Consult for Heparin Infusion Indication: atrial fibrillation  No Known Allergies  Patient Measurements: Height: 5\' 10"  (177.8 cm) Weight: 86.5 kg (190 lb 11.2 oz) IBW/kg (Calculated) : 73 Heparin Dosing Weight: 88.5 kg  Vital Signs: Temp: 98.7 F (37.1 C) (08/13 1200) Temp Source: Axillary (08/13 1200) BP: 116/72 (08/13 1300) Pulse Rate: 38 (08/13 1300)  Labs: Recent Labs    10/26/22 0422 10/26/22 0422 10/27/22 0440 10/27/22 1129 10/27/22 1132 10/27/22 2259 10/28/22 0531 10/28/22 0630 10/28/22 1211  HGB  --    < > 14.9 16.3 16.7  --  14.6  --   --   HCT  --    < > 44.5 48.0 49.0  --  45.8  --   --   PLT  --   --  192  --   --   --  186  --   --   HEPARINUNFRC 0.40  --  0.39  --   --  0.16*  --  0.70 0.75*  CREATININE 1.21  --  1.08  --   --   --  1.10  --   --    < > = values in this interval not displayed.    Estimated Creatinine Clearance: 73.7 mL/min (by C-G formula based on SCr of 1.1 mg/dL).   Medical History: Past Medical History:  Diagnosis Date   Alcohol abuse    Chronic pain    COVID-19    a. 02/2019   HFrEF (heart failure with reduced ejection fraction) (HCC)    a. 05/2019 Echo: EF 35-40%; b. 07/2021 Echo: EF 40-45%; c. 10/2022 Echo: EF 20-25%, glob HK, mildly reduced RV fxn, sev dil LA, mid-mod MR, mild AI.   History of medication noncompliance    Longstanding persistent atrial fibrillation (HCC)    a. Dx 03/2018; b. 05/2019 recurrent Afib in setting of PE. CHA2DS2VASc = 1-->Eliquis later changed to xarelto; b. 07/2019 s/p DCCV (150J); d. 08/2019 admit w/ AF RVR in settting of noncompliance - longstanding persistent afib since on bb/ccb rx.   NICM (nonischemic cardiomyopathy) (HCC)    a. 05/2019 Echo: EF 35-40%; b. 05/2019 MV: EF 32%, no ischemia; c. 07/2021 Echo: EF 40-45%; d. 10/2022 Echo: EF 20-25%, glob HK.   Pulmonary embolism (HCC)    a. 05/2019 CTA Chest: mild amt of PE w/in a lower lobe branch of RPA; b. 08/2019  CTA Chest: Small PE in RML and RLL PA.    Medications:  Patient hasn't been taking Rivaroxaban since January due to insurance reasons  Also not on any rate control medications for same reason (Digoxin and Metoprolol)  Assessment: Calvin Esparza is a 62 y.o. male with a history of afib was on rivaroxaban PTA with RVR and PE not taking any of his medications since January of 2024 due to loss of insurance. Presented to the ED with a CHF exacerbation, dyspnea, peripheral edema and weight gain. Baseline heparin level was < 0.10, so we will monitor heparin using heparin level. CHADS-VASc: 4. Pharmacy consulted for initiate and manage heparin infusion.   Baseline Labs: aPTT 27, HL <0.10, PT 15.1, INR 1.2, Hgb 14.3, Hct 43.5, Plt 228   Goal of Therapy:  Heparin level 0.3-0.7 units/ml Monitor platelets by anticoagulation protocol: Yes   Date Time HL Rate/Comment  8/9 1153 0.44 1350/therapeutic x1 8/9 1808 0.52 1350/therapeutic x2 8/10 0539 0.42 1350/therapeutic  8/11 0422 0.40 1350/therapeutic  8/12 0440 0.39 1350/therapeutic  8/12  2259    0.16    1350/SUBtherapeutic  8/13     0630    0.70  1700/Therapeutic X 1  8/13 1211 0.75 1700/SUPRAtherapeutic  Plan:  - Decrease heparin rate to 1550 units/hr - Check HL 6 hrs after rate change - Continue to monitor H&H and platelets daily while on heparin infusion   Bettey Costa, PharmD Clinical Pharmacist 10/28/2022 1:42 PM

## 2022-10-28 NOTE — Progress Notes (Signed)
PROGRESS NOTE    [name]   ZOX:[id number] DOB: Aug 07, 1960  DOA: 10/24/2022 Date of Service: 10/28/22 PCP: Lauro Regulus, MD     Brief Narrative / Hospital Course:  Calvin Esparza is a 62 y.o. male with medical history significant of chronic HFrEF with LVEF 30-40%, HTN, HLD, gout, PE and PAF on Xarelto, alcohol abuse, presented with worsening of fatigue, exertional dyspnea, and increasing leg swelling. Patient stopped taking all his medications since January this year due to insurance coverage issue. Started about 2 months ago, patient started to feel fatigued, exertional dyspnea and gradually getting worse.  08/09: to ED. chest x-ray showed cardiomegaly and pulmonary congestion, CT angiogram negative for PE. Creatinine 1.2, K3.8, WBC 7.2, hemoglobin 14.3. HR 170's. Tx w/ po metoprolol and IV lasix. Admitted for CHF, Afib.  08/10: HR remains elevated 110s. BP improved. Net IO Since Admission: -3L. Cr 1.04. Echo reviewed - EF 20-25%, global hypokinesis, indeterminate diastolic parameters, RV fxn reduced, mild pulm HTN, RA and LA dilation, mild/mod MR (compared to Echo 07/2021 w/ EF 40-45%). Cardio consult - plan for cardiac cath.  08/11: pending cath, continue diuresis, Net IO Since Admission: -6L 08/12: Net IO Since Admission: -7.5L. Cardiac cath showing significantly reduced cardiac output. Transferring to SDU for milrinone gtt   08/13: stable on milrinone.   Consultants:  Cardiology   Procedures: 10/27/22 cardiac cath       ASSESSMENT & PLAN:   Principal Problem:   CHF (congestive heart failure) (HCC) Active Problems:   Atrial fibrillation with RVR (HCC)   Acute on chronic combined systolic (congestive) and diastolic (congestive) heart failure (HCC)  Acute on chronic HFrEF decompensation Pulmonary HTN Severe reduced CO on cath  Secondary to medication noncompliance Transfer to SDU for milrinone gtt  Diuresis held for now Strict I&O, fluid restriction  diet  Beta blocker - metoprolol succinate 12.5 mg daily  Farxiga Consider challenging with a low-dose spironolactone if blood pressure, renal function, and potassium allow.  Low threshold for transfer to [facility] if his cardiac output does and/or we are unable to wean him from milrinone for further management by the advanced heart failure team.   Afib RVR - RVR resolved, remains elevated HR 110s Heparin gtt, plan to transition back to apixaban once it is clear that no further invasive procedures will be necessary at this admission.  Previously on diltiazem, will avoid in CHF  Metoprolol succinate 12.5 mg daily  Digoxin Telemetry until rate improved/stable  Cardiology following    History of cocaine abuse UDS ordered, pending   Alcohol abuse No symptoms or signs of active withdrawal CIWA protocol with as needed benzo Thiamine, Folic acid    History of PE CTA Chest negative this admission for PE Anticoag as above see Afib   DVT prophylaxis: heparin per cardiology  Pertinent IV fluids/nutrition: no continuous IV fluids w/ CHF, fluid restricted cardiac diet  Central lines / invasive devices: none  Code Status: FULL CODE ACP documentation reviewed: 10/25/22 none on file   Current Admission Status: inpatient   TOC needs / Dispo plan: TBD Barriers to discharge / significant pending items: SDU on milrinone gtt, anticipate here several more days, possible transfer to [facility] per cardiology              Subjective / Brief ROS:  Patient reports doing okay this morning  Denies CP.  Pain controlled.  Denies new weakness.  Reports no concerns w/ urination/defecation.   Family Communication: none at  this time     Objective Findings:  Vitals:   10/28/22 1000 10/28/22 1100 10/28/22 1200 10/28/22 1300  BP: 113/83 115/77 109/67 116/72  Pulse: (!) 103 (!) 104 (!) 108 (!) 38  Resp: 17 11 18 19   Temp:   98.7 F (37.1 C)   TempSrc:   Axillary   SpO2: 93% 93% 96% 94%   Weight:      Height:        Intake/Output Summary (Last 24 hours) at 10/28/2022 1423 Last data filed at 10/28/2022 1414 Gross per 24 hour  Intake 1647.78 ml  Output 1350 ml  Net 297.78 ml   Filed Weights   10/27/22 0345 10/27/22 1521 10/28/22 0500  Weight: 81 kg 81 kg 86.5 kg    Examination:  Physical Exam Constitutional:      General: He is not in acute distress. Cardiovascular:     Rate and Rhythm: Normal rate. Rhythm irregular.     Heart sounds: Murmur heard.     Systolic murmur is present.  Pulmonary:     Effort: Pulmonary effort is normal.  Abdominal:     Palpations: Abdomen is soft.  Skin:    General: Skin is warm and dry.  Neurological:     General: No focal deficit present.     Mental Status: He is alert and oriented to person, place, and time.  Psychiatric:        Mood and Affect: Mood normal.        Behavior: Behavior normal.          Scheduled Medications:   Chlorhexidine Gluconate Cloth  6 each Topical Daily   dapagliflozin propanediol  10 mg Oral QAC breakfast   digoxin  0.125 mg Oral Daily   folic acid  1 mg Oral Daily   furosemide  20 mg Oral Daily   losartan  25 mg Oral Daily   metoprolol succinate  12.5 mg Oral Daily   multivitamin with minerals  1 tablet Oral Daily   polyethylene glycol  17 g Oral Daily   sodium chloride flush  10-40 mL Intracatheter Q12H   sodium chloride flush  3 mL Intravenous Q12H   sodium chloride flush  3 mL Intravenous Q12H   spironolactone  12.5 mg Oral Daily   thiamine  100 mg Oral Daily   Or   thiamine  100 mg Intravenous Daily    Continuous Infusions:  sodium chloride     sodium chloride     heparin 1,550 Units/hr (10/28/22 1345)   milrinone 0.0625 mcg/kg/min (10/28/22 1345)    PRN Medications:  sodium chloride, sodium chloride, acetaminophen, ondansetron (ZOFRAN) IV, senna-docusate, sodium chloride flush, sodium chloride flush, sodium chloride flush, traZODone  Antimicrobials from admission:   Anti-infectives (From admission, onward)    None           Data Reviewed:  I have personally reviewed the following...  CBC: Recent Labs  Lab 10/24/22 0513 10/25/22 0539 10/27/22 0440 10/27/22 1129 10/27/22 1132 10/28/22 0531  WBC 7.2 5.1 6.1  --   --  5.5  HGB 14.3 13.5 14.9 16.3 16.7 14.6  HCT 43.5 40.8 44.5 48.0 49.0 45.8  MCV 87.9 86.4 85.6  --   --  89.8  PLT 228 179 192  --   --  186   Basic Metabolic Panel: Recent Labs  Lab 10/24/22 0513 10/25/22 0539 10/26/22 0422 10/27/22 0440 10/27/22 1129 10/27/22 1132 10/28/22 0531  NA 141 137 134* 135 140 138 136  K 3.8 3.3* 3.5 3.6 3.8 3.9 3.5  CL 102 99 98 99  --   --  100  CO2 26 26 25 27   --   --  29  GLUCOSE 160* 99 103* 101*  --   --  127*  BUN 22* 20 23* 21*  --   --  20  CREATININE 1.20 1.04 1.21 1.08  --   --  1.10  CALCIUM 9.1 8.6* 8.8* 9.0  --   --  8.8*  MG  --   --   --   --   --   --  2.6*   GFR: Estimated Creatinine Clearance: 73.7 mL/min (by C-G formula based on SCr of 1.1 mg/dL). Liver Function Tests: No results for input(s): "AST", "ALT", "ALKPHOS", "BILITOT", "PROT", "ALBUMIN" in the last 168 hours. No results for input(s): "LIPASE", "AMYLASE" in the last 168 hours. No results for input(s): "AMMONIA" in the last 168 hours. Coagulation Profile: Recent Labs  Lab 10/24/22 0543  INR 1.2   Cardiac Enzymes: No results for input(s): "CKTOTAL", "CKMB", "CKMBINDEX", "TROPONINI" in the last 168 hours. BNP (last 3 results) No results for input(s): "PROBNP" in the last 8760 hours. HbA1C: No results for input(s): "HGBA1C" in the last 72 hours.  CBG: Recent Labs  Lab 10/24/22 1143 10/24/22 1747 10/24/22 2201 10/25/22 0809 10/25/22 1603  GLUCAP 106* 122* 127* 94 106*   Lipid Profile: No results for input(s): "CHOL", "HDL", "LDLCALC", "TRIG", "CHOLHDL", "LDLDIRECT" in the last 72 hours. Thyroid Function Tests: No results for input(s): "TSH", "T4TOTAL", "FREET4", "T3FREE", "THYROIDAB"  in the last 72 hours. Anemia Panel: No results for input(s): "VITAMINB12", "FOLATE", "FERRITIN", "TIBC", "IRON", "RETICCTPCT" in the last 72 hours. Most Recent Urinalysis On File:  No results found for: "COLORURINE", "APPEARANCEUR", "LABSPEC", "PHURINE", "GLUCOSEU", "HGBUR", "BILIRUBINUR", "KETONESUR", "PROTEINUR", "UROBILINOGEN", "NITRITE", "LEUKOCYTESUR" Sepsis Labs: @LABRCNTIP (procalcitonin:4,lacticidven:4) Microbiology: Recent Results (from the past 240 hour(s))  MRSA Next Gen by PCR, Nasal     Status: None   Collection Time: 10/27/22  3:34 PM   Specimen: Nasal Mucosa; Nasal Swab  Result Value Ref Range Status   MRSA by PCR Next Gen NOT DETECTED NOT DETECTED Final    Comment: (NOTE) The GeneXpert MRSA Assay (FDA approved for NASAL specimens only), is one component of a comprehensive MRSA colonization surveillance program. It is not intended to diagnose MRSA infection nor to guide or monitor treatment for MRSA infections. Test performance is not FDA approved in patients less than 31 years old. Performed at [facility], [address] [place], [place] [address]       Radiology Studies last 3 days: [facility] Chest [facility] 1 View  Result Date: 10/27/2022 CLINICAL DATA:  PICC line placement EXAM: PORTABLE CHEST 1 VIEW COMPARISON:  10/25/2018 FINDINGS: Left PICC line tip: Lower SVC. No pneumothorax or complicating feature. Moderate enlargement of the cardiopericardial silhouette. The lungs appear clear. No blunting of the costophrenic angles. Thoracic spondylosis. IMPRESSION: 1. Left PICC line tip: Lower SVC. 2. Moderate enlargement of the cardiopericardial silhouette. Electronically Signed   By: Gaylyn Rong M.D.   On: 10/27/2022 15:32   [place] EKG Site Rite  Result Date: 10/27/2022 If Site Rite image not attached, placement could not be confirmed due to current cardiac rhythm.  CARDIAC CATHETERIZATION  Result Date: 10/27/2022 Conclusions: No angiographically significant  coronary artery disease. Upper normal left and right heart filling pressures. Severely reduced Fick cardiac output/index.  Low PAPi suggests a component of right heart failure as well. Recommendations: Transfer  to stepdown and initiate milrinone therapy. Decrease metoprolol succinate in the setting of low-output heart failure. Maintain net even fluid balance. If cardiac output cannot be improved and/or milrinone cannot be weaned off, will need to consider transfer to [facility] for further management by the advanced heart failure team. Yvonne Kendall, MD [facility]  DG Chest 1 View  Result Date: 10/25/2022 CLINICAL DATA:  [address] with CHF. EXAM: CHEST  1 VIEW COMPARISON:  CTA chest yesterday at 6:15 a.m., portable chest yesterday at 5:08 a.m. FINDINGS: 7:01 a.m. Moderate cardiomegaly. There continues to be mild central vascular prominence but no overt edema. The lungs are clear of infiltrates. There is no substantial pleural effusion. The mediastinum is stable with lipomatosis and mild aortic tortuosity and atherosclerosis. No new osseous abnormality. Thoracic spondylosis. Overlying monitor wires. IMPRESSION: Cardiomegaly with mild central vascular prominence but no overt edema or evidence of pneumonia. No significant change from yesterday's study. Electronically Signed   By: Almira Bar M.D.   On: 10/25/2022 07:46             LOS: 4 days   .  Sunnie Nielsen, DO [facility] 10/28/2022, 2:23 PM    Dictation software may have been used to generate the above note. Typos may occur and escape review in typed/dictated notes. Please contact Dr Lyn Hollingshead directly for clarity if needed.  Staff may message me via secure chat in Epic  but this may not receive an immediate response,  please page me for urgent matters!  If 7PM-7AM, please contact night coverage [email or web address]

## 2022-10-28 NOTE — Plan of Care (Signed)
  Problem: Coping: Goal: Ability to adjust to condition or change in health will improve Outcome: Progressing   Problem: Fluid Volume: Goal: Ability to maintain a balanced intake and output will improve Outcome: Progressing   Problem: Tissue Perfusion: Goal: Adequacy of tissue perfusion will improve Outcome: Progressing   Problem: Clinical Measurements: Goal: Will remain free from infection Outcome: Progressing   Problem: Pain Managment: Goal: General experience of comfort will improve Outcome: Progressing   Problem: Safety: Goal: Ability to remain free from injury will improve Outcome: Progressing   Problem: Skin Integrity: Goal: Risk for impaired skin integrity will decrease Outcome: Progressing

## 2022-10-28 NOTE — Telephone Encounter (Signed)
Pharmacy Patient Advocate Encounter  Received notification from [facility] that Prior Authorization for  Jardiance 10mg   has been APPROVED from 10/27/2022 to 10/26/2023. Ran test claim, Copay is $$583.19 due to $7500 deductilbe. This test claim was processed through [facility]- copay amounts may vary at other pharmacies due to pharmacy/plan contracts, or as the patient moves through the different stages of their insurance plan.   PA #/Case ID/Reference #: [id number]

## 2022-10-28 NOTE — Consult Note (Signed)
ANTICOAGULATION CONSULT NOTE  Pharmacy Consult for Heparin Infusion Indication: atrial fibrillation  No Known Allergies  Patient Measurements: Height: 5\' 10"  (177.8 cm) Weight: 86.5 kg (190 lb 11.2 oz) IBW/kg (Calculated) : 73 Heparin Dosing Weight: 88.5 kg  Vital Signs: Temp: 98.5 F (36.9 C) (08/13 2000) Temp Source: Oral (08/13 2000) BP: 101/74 (08/13 2000) Pulse Rate: 106 (08/13 2000)  Labs: Recent Labs    10/26/22 0422 10/26/22 0422 10/27/22 0440 10/27/22 1129 10/27/22 1132 10/27/22 2259 10/28/22 0531 10/28/22 0630 10/28/22 1211 10/28/22 2009  HGB  --    < > 14.9 16.3 16.7  --  14.6  --   --   --   HCT  --    < > 44.5 48.0 49.0  --  45.8  --   --   --   PLT  --   --  192  --   --   --  186  --   --   --   HEPARINUNFRC 0.40  --  0.39  --   --    < >  --  0.70 0.75* 0.67  CREATININE 1.21  --  1.08  --   --   --  1.10  --   --   --    < > = values in this interval not displayed.    Estimated Creatinine Clearance: 73.7 mL/min (by C-G formula based on SCr of 1.1 mg/dL).   Medical History: Past Medical History:  Diagnosis Date   Alcohol abuse    Chronic pain    COVID-19    a. 02/2019   HFrEF (heart failure with reduced ejection fraction) (HCC)    a. 05/2019 Echo: EF 35-40%; b. 07/2021 Echo: EF 40-45%; c. 10/2022 Echo: EF 20-25%, glob HK, mildly reduced RV fxn, sev dil LA, mid-mod MR, mild AI.   History of medication noncompliance    Longstanding persistent atrial fibrillation (HCC)    a. Dx 03/2018; b. 05/2019 recurrent Afib in setting of PE. CHA2DS2VASc = 1-->Eliquis later changed to xarelto; b. 07/2019 s/p DCCV (150J); d. 08/2019 admit w/ AF RVR in settting of noncompliance - longstanding persistent afib since on bb/ccb rx.   NICM (nonischemic cardiomyopathy) (HCC)    a. 05/2019 Echo: EF 35-40%; b. 05/2019 MV: EF 32%, no ischemia; c. 07/2021 Echo: EF 40-45%; d. 10/2022 Echo: EF 20-25%, glob HK.   Pulmonary embolism (HCC)    a. 05/2019 CTA Chest: mild amt of PE w/in a  lower lobe branch of RPA; b. 08/2019 CTA Chest: Small PE in RML and RLL PA.    Medications:  Patient hasn't been taking Rivaroxaban since January due to insurance reasons  Also not on any rate control medications for same reason (Digoxin and Metoprolol)  Assessment: Calvin Esparza is a 62 y.o. male with a history of afib was on rivaroxaban PTA with RVR and PE not taking any of his medications since January of 2024 due to loss of insurance. Presented to the ED with a CHF exacerbation, dyspnea, peripheral edema and weight gain. Baseline heparin level was < 0.10, so we will monitor heparin using heparin level. CHADS-VASc: 4. Pharmacy consulted for initiate and manage heparin infusion.   Baseline Labs: aPTT 27, HL <0.10, PT 15.1, INR 1.2, Hgb 14.3, Hct 43.5, Plt 228   Goal of Therapy:  Heparin level 0.3-0.7 units/ml Monitor platelets by anticoagulation protocol: Yes   Date Time HL Rate/Comment  8/9 1153 0.44 1350/therapeutic x1 8/9 1808 0.52 1350/therapeutic x2 8/10 0539  0.42 1350/therapeutic  8/11 0422 0.40 1350/therapeutic  8/12 0440 0.39 1350/therapeutic  8/12     2259    0.16    1350/SUBtherapeutic  8/13     0630    0.70  1700/Therapeutic X 1  8/13 1211 0.75 1700/SUPRAtherapeutic 8/13 2009 0.67   Plan:  Heparin level is therapeutic. Will continue heparin infusion at  1550 units/hr. Recheck heparin level and CBC in 6 hours.   Ronnald Ramp, PharmD Clinical Pharmacist 10/28/2022 8:59 PM

## 2022-10-28 NOTE — TOC CM/SW Note (Addendum)
Cm received TOC consult for lost of insurance. Pt stated that he has not taken any medications since the beginning of the year. CM informed Pharmacist on medication assistance. Also pt has been given SA education. Cm waiting for PT/OT eval for SNF recommendations. Cm continue to follow for toc needs.

## 2022-10-28 NOTE — Consult Note (Signed)
ANTICOAGULATION CONSULT NOTE  Pharmacy Consult for Heparin Infusion Indication: atrial fibrillation  No Known Allergies  Patient Measurements: Height: 5\' 10"  (177.8 cm) Weight: 86.5 kg (190 lb 11.2 oz) IBW/kg (Calculated) : 73 Heparin Dosing Weight: 88.5 kg  Vital Signs: Temp: 98 F (36.7 C) (08/13 0200) Temp Source: Oral (08/13 0200) BP: 114/91 (08/13 0600) Pulse Rate: 99 (08/13 0700)  Labs: Recent Labs    10/26/22 0422 10/26/22 0422 10/27/22 0440 10/27/22 1129 10/27/22 1132 10/27/22 2259 10/28/22 0531 10/28/22 0630  HGB  --    < > 14.9 16.3 16.7  --  14.6  --   HCT  --    < > 44.5 48.0 49.0  --  45.8  --   PLT  --   --  192  --   --   --  186  --   HEPARINUNFRC 0.40  --  0.39  --   --  0.16*  --  0.70  CREATININE 1.21  --  1.08  --   --   --  1.10  --    < > = values in this interval not displayed.    Estimated Creatinine Clearance: 73.7 mL/min (by C-G formula based on SCr of 1.1 mg/dL).   Medical History: Past Medical History:  Diagnosis Date   Alcohol abuse    Chronic pain    COVID-19    a. 02/2019   HFrEF (heart failure with reduced ejection fraction) (HCC)    a. 05/2019 Echo: EF 35-40%; b. 07/2021 Echo: EF 40-45%; c. 10/2022 Echo: EF 20-25%, glob HK, mildly reduced RV fxn, sev dil LA, mid-mod MR, mild AI.   History of medication noncompliance    Longstanding persistent atrial fibrillation (HCC)    a. Dx 03/2018; b. 05/2019 recurrent Afib in setting of PE. CHA2DS2VASc = 1-->Eliquis later changed to xarelto; b. 07/2019 s/p DCCV (150J); d. 08/2019 admit w/ AF RVR in settting of noncompliance - longstanding persistent afib since on bb/ccb rx.   NICM (nonischemic cardiomyopathy) (HCC)    a. 05/2019 Echo: EF 35-40%; b. 05/2019 MV: EF 32%, no ischemia; c. 07/2021 Echo: EF 40-45%; d. 10/2022 Echo: EF 20-25%, glob HK.   Pulmonary embolism (HCC)    a. 05/2019 CTA Chest: mild amt of PE w/in a lower lobe branch of RPA; b. 08/2019 CTA Chest: Small PE in RML and RLL PA.     Medications:  Patient hasn't been taking Rivaroxaban since January due to insurance reasons  Also not on any rate control medications for same reason (Digoxin and Metoprolol)  Assessment: Calvin Esparza is a 62 y.o. male with a history of afib was on rivaroxaban PTA with RVR and PE not taking any of his medications since January of 2024 due to loss of insurance. Presented to the ED with a CHF exacerbation, dyspnea, peripheral edema and weight gain. Baseline heparin level was < 0.10, so we will monitor heparin using heparin level. CHADS-VASc: 4. Pharmacy consulted for initiate and manage heparin infusion.   Baseline Labs: aPTT 27, HL <0.10, PT 15.1, INR 1.2, Hgb 14.3, Hct 43.5, Plt 228   Goal of Therapy:  Heparin level 0.3-0.7 units/ml Monitor platelets by anticoagulation protocol: Yes   Date Time HL Rate/Comment  8/9 1153 0.44 1350/therapeutic x1 8/9 1808 0.52 1350/therapeutic x2 8/10 0539 0.42 1350/therapeutic  8/11 0422 0.40 1350/therapeutic  8/12 0440 0.39 1350/therapeutic  8/12     2259    0.16    1350/SUBtherapeutic  8/13  0630    0.70     1700/Therapeutic X 1   Plan:  8/13:  HL @ 0630 = 0.70, therapeutic X 1 - Will continue pt on current rate and recheck HL in 6 hrs Continue to monitor H&H and platelets daily while on heparin infusion   , D, PharmD Clinical Pharmacist 10/28/2022 7:09 AM

## 2022-10-28 NOTE — Progress Notes (Signed)
Rounding Note    Patient Name: Calvin Esparza Date of Encounter: 10/28/2022  [facility] Cardiologist: Julien Nordmann, MD   Subjective   Patient seen on a.m. rounds.  Denies any chest pain or worsening shortness of breath.  Did endorse some visual changes with blurry vision this morning.  Overall states that he feels better than he has in quite some time.  -437.5 mL output in the last 24 hours, -8 L output since admission.  O2 saturation 76.1 on Cooxemetry panel this morning.  CVP reading 10-12.  Inpatient Medications    Scheduled Meds:  Chlorhexidine Gluconate Cloth  6 each Topical Daily   dapagliflozin propanediol  10 mg Oral QAC breakfast   digoxin  0.125 mg Oral Daily   folic acid  1 mg Oral Daily   losartan  25 mg Oral Daily   metoprolol succinate  12.5 mg Oral Daily   multivitamin with minerals  1 tablet Oral Daily   polyethylene glycol  17 g Oral Daily   sodium chloride flush  10-40 mL Intracatheter Q12H   sodium chloride flush  3 mL Intravenous Q12H   sodium chloride flush  3 mL Intravenous Q12H   thiamine  100 mg Oral Daily   Or   thiamine  100 mg Intravenous Daily   Continuous Infusions:  sodium chloride     sodium chloride     heparin 1,700 Units/hr (10/28/22 0000)   milrinone 0.125 mcg/kg/min (10/28/22 0358)   PRN Meds: sodium chloride, sodium chloride, acetaminophen, ondansetron (ZOFRAN) IV, senna-docusate, sodium chloride flush, sodium chloride flush, sodium chloride flush, traZODone   Vital Signs    Vitals:   10/28/22 0500 10/28/22 0600 10/28/22 0700 10/28/22 0800  BP: 108/68 (!) 114/91  (!) 123/90  Pulse: 98 (!) 44 99 (!) 50  Resp: 16 20 16 18   Temp:    97.9 F (36.6 C)  TempSrc:    Oral  SpO2: 93% 95% 96% 94%  Weight: 86.5 kg     Height:        Intake/Output Summary (Last 24 hours) at 10/28/2022 0833 Last data filed at 10/28/2022 0603 Gross per 24 hour  Intake 462.46 ml  Output 900 ml  Net -437.54 ml      10/28/2022    5:00  AM 10/27/2022    3:21 PM 10/27/2022    3:45 AM  Last 3 Weights  Weight (lbs) 190 lb 11.2 oz 178 lb 9.2 oz 178 lb 9.2 oz  Weight (kg) 86.5 kg 81 kg 81 kg      Telemetry    Rate controlled atrial fibrillation - Personally Reviewed  ECG    No new tracings- Personally Reviewed  Physical Exam   GEN: No acute distress.   Neck: No JVD Cardiac: IR IR, I/VI murmur without rubs, or gallops.  Respiratory: Clear to auscultation bilaterally. GI: Soft, nontender, non-distended  MS: No edema; No deformity. Neuro:  Nonfocal  Psych: Normal affect   Labs    High Sensitivity Troponin:   Recent Labs  Lab 10/24/22 0513 10/24/22 0732  TROPONINIHS 15 14     Chemistry Recent Labs  Lab 10/26/22 0422 10/27/22 0440 10/27/22 1129 10/27/22 1132 10/28/22 0531  NA 134* 135 140 138 136  K 3.5 3.6 3.8 3.9 3.5  CL 98 99  --   --  100  CO2 25 27  --   --  29  GLUCOSE 103* 101*  --   --  127*  BUN 23* 21*  --   --  20  CREATININE 1.21 1.08  --   --  1.10  CALCIUM 8.8* 9.0  --   --  8.8*  MG  --   --   --   --  2.6*  GFRNONAA >60 >60  --   --  >60  ANIONGAP 11 9  --   --  7    Lipids No results for input(s): "CHOL", "TRIG", "HDL", "LABVLDL", "LDLCALC", "CHOLHDL" in the last 168 hours.  Hematology Recent Labs  Lab 10/25/22 0539 10/27/22 0440 10/27/22 1129 10/27/22 1132 10/28/22 0531  WBC 5.1 6.1  --   --  5.5  RBC 4.72 5.20  --   --  5.10  HGB 13.5 14.9 16.3 16.7 14.6  HCT 40.8 44.5 48.0 49.0 45.8  MCV 86.4 85.6  --   --  89.8  MCH 28.6 28.7  --   --  28.6  MCHC 33.1 33.5  --   --  31.9  RDW 16.6* 16.5*  --   --  16.7*  PLT 179 192  --   --  186   Thyroid No results for input(s): "TSH", "FREET4" in the last 168 hours.  BNP Recent Labs  Lab 10/24/22 0513  BNP 910.5*    DDimer No results for input(s): "DDIMER" in the last 168 hours.   Radiology    DG Chest Port 1 View  Result Date: 10/27/2022 CLINICAL DATA:  PICC line placement EXAM: PORTABLE CHEST 1 VIEW COMPARISON:   10/25/2018 FINDINGS: Left PICC line tip: Lower SVC. No pneumothorax or complicating feature. Moderate enlargement of the cardiopericardial silhouette. The lungs appear clear. No blunting of the costophrenic angles. Thoracic spondylosis. IMPRESSION: 1. Left PICC line tip: Lower SVC. 2. Moderate enlargement of the cardiopericardial silhouette. Electronically Signed   By: Gaylyn Rong M.D.   On: 10/27/2022 15:32   [place] EKG Site Rite  Result Date: 10/27/2022 If Site Rite image not attached, placement could not be confirmed due to current cardiac rhythm.  CARDIAC CATHETERIZATION  Result Date: 10/27/2022 Conclusions: No angiographically significant coronary artery disease. Upper normal left and right heart filling pressures. Severely reduced Fick cardiac output/index.  Low PAPi suggests a component of right heart failure as well. Recommendations: Transfer to stepdown and initiate milrinone therapy. Decrease metoprolol succinate in the setting of low-output heart failure. Maintain net even fluid balance. If cardiac output cannot be improved and/or milrinone cannot be weaned off, will need to consider transfer to [facility] for further management by the advanced heart failure team. Yvonne Kendall, MD [facility]   Cardiac Studies  [facility] 10/27/22 Conclusions: No angiographically significant coronary artery disease. Upper normal left and right heart filling pressures. Severely reduced Fick cardiac output/index.  Low PAPi suggests a component of right heart failure as well.   Recommendations: Transfer to stepdown and initiate milrinone therapy. Decrease metoprolol succinate in the setting of low-output heart failure. Maintain net even fluid balance. If cardiac output cannot be improved and/or milrinone cannot be weaned off, will need to consider transfer to [facility] for further management by the advanced heart failure team.  TTE 10/24/22 1. Left ventricular ejection fraction, by estimation, is 20  to 25%. Left  ventricular ejection fraction by PLAX is 23 %. The left ventricle has  severely decreased function. The left ventricle demonstrates global  hypokinesis. Left ventricular diastolic  parameters are indeterminate.   2. Right ventricular systolic function is mildly reduced. The right  ventricular size is mildly enlarged. There is mildly elevated pulmonary  artery systolic pressure.   3. Left atrial size was severely dilated.   4. Right atrial size was mildly dilated.   5. The mitral valve is normal in structure. Mild to moderate mitral valve  regurgitation.   6. The aortic valve is tricuspid. Aortic valve regurgitation is mild.   7. The inferior vena cava is dilated in size with >50% respiratory  variability, suggesting right atrial pressure of 8 mmHg.   Patient Profile     62 y.o. male with a history of NICM, chronic HFrEF, permanent A-fib, PE in 2021, noncompliance, EtOH abuse, gout, and chronic pain, who was admitted 8/9 with volume overload and rapid atrial fibrillation in the setting of noncompliance.  Assessment & Plan    Acute on chronic heart failure with reduced ejection fraction/nonischemic cardiomyopathy -LVEF on echocardiogram 20-25%, severely decreased function, mild ventricular systolic function is mildly reduced, mild to moderate mitral valve regurgitation, mild aortic regurgitation -Maintain net even fluid balance -Diuretics remain on hold -Toprol-XL decreased -Milrinone drip decreased 0.0625 mcg/kg/min -CVP 10-12 -Repeat Cooxemetry panel this this afternoon -Continued on digoxin, Farxiga (change to [place] on discharge for insurance purposes), and losartan -Daily weights, I's and O's, low-sodium diet -If unable to wean from milrinone for further management by advanced heart failure need to be transferred to [facility]  Permanent atrial fibrillation -Remains rate controlled atrial fibrillation on monitor -Continued on heparin infusion -Continued on digoxin  and Toprol-XL -Continue with telemetry monitoring -Toprol-XL was decreased in the setting of low output heart failure -Continue to avoid calcium channel blockers like diltiazem and verapamil -Plan will be to transition off of heparin infusion to apixaban once he has no further invasive procedures necessary this admission  Polysubstance abuse -Patient with history of heavy alcohol use and remote cocaine use -UDS pending -Continue monitoring for alcohol withdrawal -If UDS positive it limits his candidacy for advanced heart failure treatments     CHA2DS2-VASc Score = 1   This indicates a 0.6% annual risk of stroke. The patient's score is based upon: CHF History: 1 HTN History: 0 Diabetes History: 0 Stroke History: 0 Vascular Disease History: 0 Age Score: 0 Gender Score: 0        For questions or updates, please contact [facility] Please consult [email or web address] for contact info under        Signed,  , NP  10/28/2022, 8:33 AM

## 2022-10-28 NOTE — Progress Notes (Signed)
[facility] HF Stewardship  PCP: Lauro Regulus, MD  PCP-Cardiologist: Julien Nordmann, MD  HPI: Calvin Esparza is a 62 y.o. male with CHF, HTN, HLD, gout, PE and PAF on Xarelto, alcohol abuse, presented with worsening of fatigue, exertional dyspnea, and increasing leg swelling. Echo in 2021 showed LVEF of 35-40%. LVEF increased to 40-45% in 07/2021. New echo pending. Patient has been off HF medications since January due to cost and experienced progression of symptoms starting in June. BNP 910 on admission. Diuresed with IV Lasix. Transitioned to PO diuretic after mild creatine bump and symptom improvement. LHC on 10/27/22 showed no obstructive CAD. RHC 10/27/22 showed RA 8, PA 23/16 (18), PCWP 16, CO 2.7, CI 1.4, PAPi 0.9. Given low output heart failure, milrinone started and transferred to ICU.   Pertinent Lab Values: BUN  Date Value Ref Range Status  10/28/2022 20 6 - 20 mg/dL Final  16/12/9602 22 6 - 24 mg/dL Final   Potassium  Date Value Ref Range Status  10/28/2022 3.5 3.5 - 5.1 mmol/L Final   Sodium  Date Value Ref Range Status  10/28/2022 136 135 - 145 mmol/L Final  12/03/2020 140 134 - 144 mmol/L Final   B Natriuretic Peptide  Date Value Ref Range Status  10/24/2022 910.5 (H) 0.0 - 100.0 pg/mL Final    Comment:    Performed at [facility], [address] [place], [place] [address]   Magnesium  Date Value Ref Range Status  10/28/2022 2.6 (H) 1.7 - 2.4 mg/dL Final    Comment:    Performed at [facility], [address] [place], [place] [address]   TSH  Date Value Ref Range Status  02/25/2020 1.449 0.350 - 4.500 uIU/mL Final    Comment:    Performed by a 3rd Generation assay with a functional sensitivity of <=0.01 uIU/mL. Performed at [facility], [address] [place], [place] [address]     Vital Signs: Temp:  [97.2 F (36.2 C)-98 F (36.7 C)] 97.9 F (36.6 C) (08/13 0800) Pulse Rate:  [35-119] 50 (08/13 0800) Cardiac  Rhythm: Atrial fibrillation (08/12 2050) Resp:  [11-27] 18 (08/13 0800) BP: (89-134)/(57-94) 123/90 (08/13 0800) SpO2:  [88 %-96 %] 94 % (08/13 0800) Weight:  [81 kg (178 lb 9.2 oz)-86.5 kg (190 lb 11.2 oz)] 86.5 kg (190 lb 11.2 oz) (08/13 0500)   Intake/Output Summary (Last 24 hours) at 10/28/2022 0818 Last data filed at 10/28/2022 0603 Gross per 24 hour  Intake 462.46 ml  Output 900 ml  Net -437.54 ml    Current HF Medications:  Metoprolol succinate 12.5 mg daily Farxiga 10 mg daily Digoxin 0.125 mg daily Furosemide 40 mg BID  Prior to admission HF Medications:  None (non-adherence)  Assessment: 1. Acute on chronic systolic heart failure (LVEF in 2023 40-45%), due to NICM. NYHA class III-IV symptoms.  - BP improved with improvement in CI to 3.52 by Fick based on COOX of 76.1 on milrinone 0.25. Dose reduced by cardiology this AM.  -Reports feeling great now. No symptoms at this time. -Wt at home ~195, now down to 190.7.  -CVP stable from 6-9 overnight per RN. No diuretics yesterday. Can start oral diuretics today.   Plan: 1) Medication changes recommended at this time: -Consider adding spironolactone 12.5 mg daily -Consider adding furosemide 20 mg daily to maintain euvolemia -Consider swapping from [place] to [place] due to insurance formulary.  2) Patient assistance: -Patient qualifies for copay cards with commercial insurance.  -Deductible  is $7,500, therefore copay cards are essential before discharge. Entresto copay cards help cover deductible. Sherryll Burger copay: $656.56 -Jardiance requires prior authorization -Xarelto copay: $543.58  3) Education: -To be completed prior to discharge.  Medication Assistance / Insurance Benefits Check:  Does the patient have prescription insurance? Prescription Insurance: Commercial ([employer])  Does the patient qualify for medication assistance through manufacturers or grants? Pending   Eligible grants and/or patient  assistance programs: pending   Medication assistance applications in progress: pending   Medication assistance applications approved: pending  Outpatient Pharmacy:  Prior to admission outpatient pharmacy: none  Is the patient willing to use [facility] pharmacy at discharge? Yes  Is the patient willing to transition their outpatient pharmacy to utilize a [facility] outpatient pharmacy?   TBD  Thank you for involving pharmacy in this patient's care.  Enos Fling, PharmD, BCPS Clinical Pharmacist 10/28/2022 8:18 AM

## 2022-10-29 ENCOUNTER — Other Ambulatory Visit (HOSPITAL_COMMUNITY): Payer: Self-pay

## 2022-10-29 DIAGNOSIS — Z91148 Patient's other noncompliance with medication regimen for other reason: Secondary | ICD-10-CM

## 2022-10-29 DIAGNOSIS — I4891 Unspecified atrial fibrillation: Secondary | ICD-10-CM | POA: Diagnosis not present

## 2022-10-29 DIAGNOSIS — I5021 Acute systolic (congestive) heart failure: Secondary | ICD-10-CM | POA: Diagnosis not present

## 2022-10-29 DIAGNOSIS — I4821 Permanent atrial fibrillation: Secondary | ICD-10-CM | POA: Diagnosis not present

## 2022-10-29 DIAGNOSIS — I509 Heart failure, unspecified: Secondary | ICD-10-CM

## 2022-10-29 DIAGNOSIS — R079 Chest pain, unspecified: Secondary | ICD-10-CM

## 2022-10-29 DIAGNOSIS — R0609 Other forms of dyspnea: Secondary | ICD-10-CM

## 2022-10-29 LAB — COOXEMETRY PANEL
Carboxyhemoglobin: 1.2 % (ref 0.5–1.5)
Methemoglobin: <0.7 % (ref 0.0–1.5)
O2 Saturation: 58.9 %
Total hemoglobin: 15.7 g/dL (ref 12.0–16.0)
Total oxygen content: 58 %

## 2022-10-29 MED ORDER — DILTIAZEM HCL 25 MG/5ML IV SOLN
2.5000 mg | Freq: Once | INTRAVENOUS | Status: AC
  Administered 2022-10-29: 2.5 mg via INTRAVENOUS
  Filled 2022-10-29: qty 5

## 2022-10-29 MED ORDER — SPIRONOLACTONE 25 MG PO TABS: 25.0000 mg | ORAL_TABLET | Freq: Every day | ORAL | Status: DC

## 2022-10-29 MED ORDER — SODIUM CHLORIDE 0.9 % IV BOLUS
250.0000 mL | Freq: Once | INTRAVENOUS | Status: AC
  Administered 2022-10-29: 250 mL via INTRAVENOUS

## 2022-10-29 MED ORDER — LOSARTAN POTASSIUM 25 MG PO TABS
37.5000 mg | ORAL_TABLET | Freq: Every day | ORAL | Status: DC
  Administered 2022-10-29: 37.5 mg via ORAL
  Filled 2022-10-29 (×2): qty 1.5

## 2022-10-29 NOTE — Progress Notes (Signed)
PROGRESS NOTE    Calvin Esparza   UXL:[id number] DOB: [birth year]  DOA: 10/24/2022 Date of Service: 10/29/22 PCP: Lauro Regulus, MD  Brief Narrative / Hospital Course:  Calvin Esparza is a 62 y.o. male with medical history significant of chronic HFrEF with LVEF 30-40%, HTN, HLD, gout, PE and PAF on Xarelto, alcohol abuse, presented with worsening of fatigue, exertional dyspnea, and increasing leg swelling. Patient stopped taking all his medications since January this year due to insurance coverage issue. Started about 2 months ago, patient started to feel fatigued, exertional dyspnea and gradually getting worse.  08/09: to ED. chest x-ray showed cardiomegaly and pulmonary congestion, CT angiogram negative for PE. Creatinine 1.2, K3.8, WBC 7.2, hemoglobin 14.3. HR 170's. Tx w/ po metoprolol and IV lasix. Admitted for CHF, Afib.  08/10: HR remains elevated 110s. BP improved. Net IO Since Admission: -3L. Cr 1.04. Echo reviewed - EF 20-25%, global hypokinesis, indeterminate diastolic parameters, RV fxn reduced, mild pulm HTN, RA and LA dilation, mild/mod MR (compared to Echo 07/2021 w/ EF 40-45%). Cardio consult - plan for cardiac cath.  08/11: pending cath, continue diuresis, Net IO Since Admission: -6L 08/12: Net IO Since Admission: -7.5L. Cardiac cath showing significantly reduced cardiac output. Transferring to SDU for milrinone gtt   08/13: stable on milrinone.  8/14: discontinued milrinone and remains stable but borderline low BP. Has not been out of bed today yet.   Consultants:  Cardiology    Procedures: 10/27/22 cardiac cath     ASSESSMENT & PLAN:   Acute on chronic HFrEF decompensation  Pulmonary HTN  Afib RVR POA- HR well controlled at rest. Increases with exertion up to 160s. Cardiology following, appreciate your care Dc milrinone Continue to monitor [facility] Continue metoprolol, digoxin Digoxin level check tomorrow Continue diuresis- volume status greatly improved.   Increased spironolactone Continue heparin gtt. Eventually transition to eliquis when no procedure verified Strict I&O, fluid restriction diet  [place]    History of cocaine use UDS ordered, pending    Alcohol abuse No symptoms or signs of active withdrawal CIWA protocol with as needed benzo Thiamine, Folic acid    History of PE CTA Chest negative this admission for PE Anticoag as above see Afib     DVT prophylaxis: heparin per cardiology  Central lines / invasive devices: none Code Status: FULL CODE ACP documentation reviewed   Current Admission Status: inpatient   TOC needs / Dispo plan: TBD Barriers to discharge / significant pending items: further medical stabilization  Subjective / Brief ROS:  Patient reports doing well. He feels significantly more energetic today and was able to sit up in bed and feed himself which is much greater than yesterday. 3 days since last BM, per patient. Not uncomfortable. Denies chest pain or SOB.   Family Communication: none at bedside  Objective Findings: Blood pressure 96/61, pulse 90, temperature 98.1 F (36.7 C), resp. rate 19, height 5\' 10"  (1.778 m), weight 88.7 kg, SpO2 92%.  Examination:  Physical Exam Vitals reviewed.  Constitutional:      General: He is not in acute distress.    Appearance: He is well-developed.  Cardiovascular:     Rate and Rhythm: Regular rhythm. Tachycardia present.     Heart sounds: Murmur heard.     Systolic murmur is present.  Pulmonary:     Effort: Pulmonary effort is normal. No respiratory distress.     Breath sounds: Normal breath sounds.  Abdominal:     General: Bowel sounds are  normal.     Palpations: Abdomen is soft.     Tenderness: There is no abdominal tenderness.  Skin:    General: Skin is warm and dry.  Neurological:     General: No focal deficit present.     Mental Status: He is alert and oriented to person, place, and time.  Psychiatric:        Mood and Affect: Mood normal.         Behavior: Behavior normal.    Data Reviewed:  I have personally reviewed the following...  CBC: Recent Labs  Lab 10/24/22 0513 10/25/22 0539 10/27/22 0440 10/27/22 1129 10/27/22 1132 10/28/22 0531 10/29/22 0146  WBC 7.2 5.1 6.1  --   --  5.5 6.7  HGB 14.3 13.5 14.9 16.3 16.7 14.6 14.6  HCT 43.5 40.8 44.5 48.0 49.0 45.8 44.3  MCV 87.9 86.4 85.6  --   --  89.8 87.5  PLT 228 179 192  --   --  186 177   Basic Metabolic Panel: Recent Labs  Lab 10/25/22 0539 10/26/22 0422 10/27/22 0440 10/27/22 1129 10/27/22 1132 10/28/22 0531 10/29/22 0146  NA 137 134* 135 140 138 136 136  K 3.3* 3.5 3.6 3.8 3.9 3.5 3.8  CL 99 98 99  --   --  100 102  CO2 26 25 27   --   --  29 27  GLUCOSE 99 103* 101*  --   --  127* 146*  BUN 20 23* 21*  --   --  20 21*  CREATININE 1.04 1.21 1.08  --   --  1.10 1.14  CALCIUM 8.6* 8.8* 9.0  --   --  8.8* 8.7*  MG  --   --   --   --   --  2.6* 2.4   Radiology Studies last 3 days: DG Chest Port 1 View  Result Date: 10/27/2022 CLINICAL DATA:  PICC line placement EXAM: PORTABLE CHEST 1 VIEW COMPARISON:  10/25/2018 FINDINGS: Left PICC line tip: Lower SVC. No pneumothorax or complicating feature. Moderate enlargement of the cardiopericardial silhouette. The lungs appear clear. No blunting of the costophrenic angles. Thoracic spondylosis. IMPRESSION: 1. Left PICC line tip: Lower SVC. 2. Moderate enlargement of the cardiopericardial silhouette. Electronically Signed   By: Gaylyn Rong M.D.   On: 10/27/2022 15:32   [place] EKG Site Rite  Result Date: 10/27/2022 If Site Rite image not attached, placement could not be confirmed due to current cardiac rhythm.  CARDIAC CATHETERIZATION  Result Date: 10/27/2022 Conclusions: No angiographically significant coronary artery disease. Upper normal left and right heart filling pressures. Severely reduced Fick cardiac output/index.  Low PAPi suggests a component of right heart failure as well. Recommendations: Transfer  to stepdown and initiate milrinone therapy. Decrease metoprolol succinate in the setting of low-output heart failure. Maintain net even fluid balance. If cardiac output cannot be improved and/or milrinone cannot be weaned off, will need to consider transfer to [facility] for further management by the advanced heart failure team. Yvonne Kendall, MD [facility]     LOS: 5 days   .  Leeroy Bock, DO [facility] 10/29/2022, 8:39 AM    If 7PM-7AM, please contact night coverage [email or web address]

## 2022-10-29 NOTE — Progress Notes (Signed)
Rounding Note    Patient Name: Calvin Esparza Date of Encounter: 10/29/2022  [facility] Cardiologist: Julien Nordmann, MD   Subjective   Patient seen on AM rounds. Denies any chest pain and states that breathing has improved. +422.7 mls in the last 24 hours, Co-Ox saturation 74.6 this AM.  CVP running 7-9. Sitting on the side of the bed this morning.  Heart rate noted to be elevated with activity.  Inpatient Medications    Scheduled Meds:  Chlorhexidine Gluconate Cloth  6 each Topical Daily   colchicine  0.6 mg Oral BID   dapagliflozin propanediol  10 mg Oral QAC breakfast   digoxin  0.125 mg Oral Daily   folic acid  1 mg Oral Daily   furosemide  20 mg Oral Daily   losartan  25 mg Oral Daily   metoprolol succinate  12.5 mg Oral Daily   multivitamin with minerals  1 tablet Oral Daily   polyethylene glycol  17 g Oral Daily   sodium chloride flush  10-40 mL Intracatheter Q12H   sodium chloride flush  3 mL Intravenous Q12H   sodium chloride flush  3 mL Intravenous Q12H   spironolactone  12.5 mg Oral Daily   thiamine  100 mg Oral Daily   Or   thiamine  100 mg Intravenous Daily   Continuous Infusions:  sodium chloride     sodium chloride     heparin 1,550 Units/hr (10/29/22 0541)   milrinone 0.0617 mcg/kg/min (10/29/22 0541)   PRN Meds: sodium chloride, sodium chloride, acetaminophen, ondansetron (ZOFRAN) IV, senna-docusate, sodium chloride flush, sodium chloride flush, sodium chloride flush, traZODone   Vital Signs    Vitals:   10/29/22 0300 10/29/22 0400 10/29/22 0500 10/29/22 0600  BP: (!) 143/99 (!) 123/97 113/82 123/87  Pulse: (!) 106 65 64 (!) 111  Resp: 11 19 15 17   Temp:      TempSrc:      SpO2: 91% (!) 89% 91% 92%  Weight:   88.7 kg   Height:        Intake/Output Summary (Last 24 hours) at 10/29/2022 0834 Last data filed at 10/29/2022 0720 Gross per 24 hour  Intake 2197.67 ml  Output 2375 ml  Net -177.33 ml      10/29/2022    5:00 AM  10/28/2022    5:00 AM 10/27/2022    3:21 PM  Last 3 Weights  Weight (lbs) 195 lb 8.8 oz 190 lb 11.2 oz 178 lb 9.2 oz  Weight (kg) 88.7 kg 86.5 kg 81 kg      Telemetry    Rate controlled atrial fibrillation with occasional bursts of RVR - Personally Reviewed  ECG    No new tracings - Personally Reviewed  Physical Exam   GEN: No acute distress.   Neck: No JVD Cardiac: RRR, I/VI systolic murmur without rubs, or gallops.  Respiratory: Clear upper lung with diminished bases to auscultation bilaterally.  Respirations are unlabored at rest on room air GI: Soft, nontender, non-distended  MS: No edema; No deformity. Neuro:  Nonfocal  Psych: Normal affect   Labs    High Sensitivity Troponin:   Recent Labs  Lab 10/24/22 0513 10/24/22 0732  TROPONINIHS 15 14     Chemistry Recent Labs  Lab 10/27/22 0440 10/27/22 1129 10/27/22 1132 10/28/22 0531 10/29/22 0146  NA 135   < > 138 136 136  K 3.6   < > 3.9 3.5 3.8  CL 99  --   --  100 102  CO2 27  --   --  29 27  GLUCOSE 101*  --   --  127* 146*  BUN 21*  --   --  20 21*  CREATININE 1.08  --   --  1.10 1.14  CALCIUM 9.0  --   --  8.8* 8.7*  MG  --   --   --  2.6* 2.4  GFRNONAA >60  --   --  >60 >60  ANIONGAP 9  --   --  7 7   < > = values in this interval not displayed.    Lipids No results for input(s): "CHOL", "TRIG", "HDL", "LABVLDL", "LDLCALC", "CHOLHDL" in the last 168 hours.  Hematology Recent Labs  Lab 10/27/22 0440 10/27/22 1129 10/27/22 1132 10/28/22 0531 10/29/22 0146  WBC 6.1  --   --  5.5 6.7  RBC 5.20  --   --  5.10 5.06  HGB 14.9   < > 16.7 14.6 14.6  HCT 44.5   < > 49.0 45.8 44.3  MCV 85.6  --   --  89.8 87.5  MCH 28.7  --   --  28.6 28.9  MCHC 33.5  --   --  31.9 33.0  RDW 16.5*  --   --  16.7* 16.8*  PLT 192  --   --  186 177   < > = values in this interval not displayed.   Thyroid No results for input(s): "TSH", "FREET4" in the last 168 hours.  BNP Recent Labs  Lab 10/24/22 0513  BNP  910.5*    DDimer No results for input(s): "DDIMER" in the last 168 hours.   Radiology    DG Chest Port 1 View  Result Date: 10/27/2022 CLINICAL DATA:  PICC line placement EXAM: PORTABLE CHEST 1 VIEW COMPARISON:  10/25/2018 FINDINGS: Left PICC line tip: Lower SVC. No pneumothorax or complicating feature. Moderate enlargement of the cardiopericardial silhouette. The lungs appear clear. No blunting of the costophrenic angles. Thoracic spondylosis. IMPRESSION: 1. Left PICC line tip: Lower SVC. 2. Moderate enlargement of the cardiopericardial silhouette. Electronically Signed   By: Gaylyn Rong M.D.   On: 10/27/2022 15:32   [place] EKG Site Rite  Result Date: 10/27/2022 If Site Rite image not attached, placement could not be confirmed due to current cardiac rhythm.  CARDIAC CATHETERIZATION  Result Date: 10/27/2022 Conclusions: No angiographically significant coronary artery disease. Upper normal left and right heart filling pressures. Severely reduced Fick cardiac output/index.  Low PAPi suggests a component of right heart failure as well. Recommendations: Transfer to stepdown and initiate milrinone therapy. Decrease metoprolol succinate in the setting of low-output heart failure. Maintain net even fluid balance. If cardiac output cannot be improved and/or milrinone cannot be weaned off, will need to consider transfer to [facility] for further management by the advanced heart failure team. Yvonne Kendall, MD [facility]   Cardiac Studies   [facility] 10/27/22 Conclusions: No angiographically significant coronary artery disease. Upper normal left and right heart filling pressures. Severely reduced Fick cardiac output/index.  Low PAPi suggests a component of right heart failure as well.   Recommendations: Transfer to stepdown and initiate milrinone therapy. Decrease metoprolol succinate in the setting of low-output heart failure. Maintain net even fluid balance. If cardiac output cannot be  improved and/or milrinone cannot be weaned off, will need to consider transfer to [facility] for further management by the advanced heart failure team.   TTE 10/24/22 1. Left ventricular ejection fraction,  by estimation, is 20 to 25%. Left  ventricular ejection fraction by PLAX is 23 %. The left ventricle has  severely decreased function. The left ventricle demonstrates global  hypokinesis. Left ventricular diastolic  parameters are indeterminate.   2. Right ventricular systolic function is mildly reduced. The right  ventricular size is mildly enlarged. There is mildly elevated pulmonary  artery systolic pressure.   3. Left atrial size was severely dilated.   4. Right atrial size was mildly dilated.   5. The mitral valve is normal in structure. Mild to moderate mitral valve  regurgitation.   6. The aortic valve is tricuspid. Aortic valve regurgitation is mild.   7. The inferior vena cava is dilated in size with >50% respiratory  variability, suggesting right atrial pressure of 8 mmHg.   Patient Profile     62 y.o. male with a history of NICM, chronic HFrEF, permanent atrial fibrillation, PE in 2021, noncompliance, EtOH abuse, gout, chronic pain, who was admitted on 8/9 with volume overload and rapid atrial fibrillation in the setting of noncompliance  Assessment & Plan    Acute on chronic heart failure with reduced ejection fraction/nonischemic cardiomyopathy -LVEF on echo 20-25%, severely decreased function, right ventricular systolic function is mildly reduced, mild to moderate mitral valve regurgitation, mild aortic regurgitation -Continue to maintain net even fluid balance -+400 mL this morning -Continue furosemide 20 mg daily -Toprol-XL continued -Milrinone drip discontinued this morning -Co-Ox sat of 74.6 this am, repeat ordered for this afternoon once milrinone drip was stopped -Continue on digoxin and Farxiga (changed to [place] on discharge for insurance purposes),  spironolactone, and losartan with increase in dose this morning -Daily weights, I's and O's, low-sodium diet  Permanent atrial fibrillation -Remains rate controlled with occasional burst of RVR -Continued on dig and Toprol-XL -Toprol-XL dosing limited in the setting of low output heart failure -Continue to avoid calcium channel blockers like diltiazem or verapamil -Currently on heparin infusion with plan to transition back to apixaban -Continue on telemetry monitoring  Polysubstance abuse -Patient with history of heavy alcohol use remote cocaine use -Continue to monitor for alcohol withdrawal      For questions or updates, please contact [facility] Please consult [email or web address] for contact info under        Signed,  , NP  10/29/2022, 8:34 AM

## 2022-10-29 NOTE — Consult Note (Signed)
ANTICOAGULATION CONSULT NOTE  Pharmacy Consult for Heparin Infusion Indication: atrial fibrillation  No Known Allergies  Patient Measurements: Height: 5\' 10"  (177.8 cm) Weight: 86.5 kg (190 lb 11.2 oz) IBW/kg (Calculated) : 73 Heparin Dosing Weight: 88.5 kg  Vital Signs: Temp: 97.5 F (36.4 C) (08/14 0200) Temp Source: Oral (08/14 0200) BP: 121/79 (08/14 0200) Pulse Rate: 98 (08/14 0200)  Labs: Recent Labs    10/27/22 0440 10/27/22 1129 10/27/22 1132 10/27/22 2259 10/28/22 0531 10/28/22 0630 10/28/22 1211 10/28/22 2009 10/29/22 0146  HGB 14.9   < > 16.7  --  14.6  --   --   --  14.6  HCT 44.5   < > 49.0  --  45.8  --   --   --  44.3  PLT 192  --   --   --  186  --   --   --  177  HEPARINUNFRC 0.39  --   --    < >  --    < > 0.75* 0.67 0.67  CREATININE 1.08  --   --   --  1.10  --   --   --  1.14   < > = values in this interval not displayed.    Estimated Creatinine Clearance: 71.2 mL/min (by C-G formula based on SCr of 1.14 mg/dL).   Medical History: Past Medical History:  Diagnosis Date   Alcohol abuse    Chronic pain    COVID-19    a. 02/2019   HFrEF (heart failure with reduced ejection fraction) (HCC)    a. 05/2019 Echo: EF 35-40%; b. 07/2021 Echo: EF 40-45%; c. 10/2022 Echo: EF 20-25%, glob HK, mildly reduced RV fxn, sev dil LA, mid-mod MR, mild AI.   History of medication noncompliance    Longstanding persistent atrial fibrillation (HCC)    a. Dx 03/2018; b. 05/2019 recurrent Afib in setting of PE. CHA2DS2VASc = 1-->Eliquis later changed to xarelto; b. 07/2019 s/p DCCV (150J); d. 08/2019 admit w/ AF RVR in settting of noncompliance - longstanding persistent afib since on bb/ccb rx.   NICM (nonischemic cardiomyopathy) (HCC)    a. 05/2019 Echo: EF 35-40%; b. 05/2019 MV: EF 32%, no ischemia; c. 07/2021 Echo: EF 40-45%; d. 10/2022 Echo: EF 20-25%, glob HK.   Pulmonary embolism (HCC)    a. 05/2019 CTA Chest: mild amt of PE w/in a lower lobe branch of RPA; b. 08/2019 CTA  Chest: Small PE in RML and RLL PA.    Medications:  Patient hasn't been taking Rivaroxaban since January due to insurance reasons  Also not on any rate control medications for same reason (Digoxin and Metoprolol)  Assessment: Calvin Esparza is a 62 y.o. male with a history of afib was on rivaroxaban PTA with RVR and PE not taking any of his medications since January of 2024 due to loss of insurance. Presented to the ED with a CHF exacerbation, dyspnea, peripheral edema and weight gain. Baseline heparin level was < 0.10, so we will monitor heparin using heparin level. CHADS-VASc: 4. Pharmacy consulted for initiate and manage heparin infusion.   Baseline Labs: aPTT 27, HL <0.10, PT 15.1, INR 1.2, Hgb 14.3, Hct 43.5, Plt 228   Goal of Therapy:  Heparin level 0.3-0.7 units/ml Monitor platelets by anticoagulation protocol: Yes   Date Time HL Rate/Comment  8/9 1153 0.44 1350/therapeutic x1 8/9 1808 0.52 1350/therapeutic x2 8/10 0539 0.42 1350/therapeutic  8/11 0422 0.40 1350/therapeutic  8/12 0440 0.39 1350/therapeutic  8/12  2259    0.16    1350/SUBtherapeutic  8/13     0630    0.70  1700/Therapeutic X 1  8/13 1211 0.75 1700/SUPRAtherapeutic 8/13 2009 0.67  8/14     0146    0.67     1550/therapeutic X 2   Plan:  8/14:  HL @ 0146 = 0.67, therapeutic X 2 - Will continue pt on current rate and recheck HL on 8/15 with AM labs.   Scherrie Gerlach, PharmD Clinical Pharmacist 10/29/2022 2:40 AM

## 2022-10-29 NOTE — Plan of Care (Signed)
  Problem: Nutritional: Goal: Maintenance of adequate nutrition will improve Outcome: Progressing   Problem: Skin Integrity: Goal: Risk for impaired skin integrity will decrease Outcome: Progressing   Problem: Tissue Perfusion: Goal: Adequacy of tissue perfusion will improve Outcome: Progressing   Problem: Clinical Measurements: Goal: Will remain free from infection Outcome: Progressing Goal: Diagnostic test results will improve Outcome: Progressing Goal: Respiratory complications will improve Outcome: Progressing Goal: Cardiovascular complication will be avoided Outcome: Progressing   Problem: Activity: Goal: Risk for activity intolerance will decrease Outcome: Progressing

## 2022-10-29 NOTE — Progress Notes (Addendum)
Uneventful day. States he feels better today. Sat up for breakfast and lunch. Up to the toilet with supervision. Continues to void in urinal. Very jovial and joking. Milnirone drip stopped at 0909. No significant symptoms to no Milnirone drip. Heparin drip continues at 1550 units/hour.

## 2022-10-29 NOTE — Progress Notes (Signed)
[facility] HF Stewardship  PCP: Lauro Regulus, MD  PCP-Cardiologist: Julien Nordmann, MD  HPI: Calvin Esparza is a 62 y.o. male with CHF, HTN, HLD, gout, PE and PAF on Xarelto, alcohol abuse, presented with worsening of fatigue, exertional dyspnea, and increasing leg swelling. Echo in 2021 showed LVEF of 35-40%. LVEF increased to 40-45% in 07/2021. New echo pending. Patient has been off HF medications since January due to cost and experienced progression of symptoms starting in June. BNP 910 on admission. Diuresed with IV Lasix. Transitioned to PO diuretic after mild creatine bump and symptom improvement. LHC on 10/27/22 showed no obstructive CAD. RHC 10/27/22 showed RA 8, PA 23/16 (18), PCWP 16, CO 2.7, CI 1.4, PAPi 0.9. Given low output heart failure, milrinone started 8/12 and transferred to ICU. Milrinone weaned off on 8/14 with marked improvement in BP.  Pertinent Lab Values: BUN  Date Value Ref Range Status  10/29/2022 21 (H) 6 - 20 mg/dL Final  16/12/9602 22 6 - 24 mg/dL Final   Potassium  Date Value Ref Range Status  10/29/2022 3.8 3.5 - 5.1 mmol/L Final   Sodium  Date Value Ref Range Status  10/29/2022 136 135 - 145 mmol/L Final  12/03/2020 140 134 - 144 mmol/L Final   B Natriuretic Peptide  Date Value Ref Range Status  10/24/2022 910.5 (H) 0.0 - 100.0 pg/mL Final    Comment:    Performed at [facility], [address] [place], [place] [address]   Magnesium  Date Value Ref Range Status  10/29/2022 2.4 1.7 - 2.4 mg/dL Final    Comment:    Performed at [facility], [address] [place], [place] [address]   TSH  Date Value Ref Range Status  02/25/2020 1.449 0.350 - 4.500 uIU/mL Final    Comment:    Performed by a 3rd Generation assay with a functional sensitivity of <=0.01 uIU/mL. Performed at [facility], [address] [place], [place] [address]     Vital Signs: Temp:  [97.5 F (36.4 C)-98.7 F (37.1 C)] 97.5 F (36.4 C)  (08/14 0200) Pulse Rate:  [38-135] 111 (08/14 0600) Cardiac Rhythm: Atrial fibrillation (08/14 0446) Resp:  [11-25] 17 (08/14 0600) BP: (101-156)/(65-123) 123/87 (08/14 0600) SpO2:  [89 %-98 %] 92 % (08/14 0600) Weight:  [88.7 kg (195 lb 8.8 oz)] 88.7 kg (195 lb 8.8 oz) (08/14 0500)   Intake/Output Summary (Last 24 hours) at 10/29/2022 0743 Last data filed at 10/29/2022 0541 Gross per 24 hour  Intake 2197.67 ml  Output 1775 ml  Net 422.67 ml    Current HF Medications:  Metoprolol succinate 12.5 mg daily Losartan 37.5 mg daily Spironolactone 12.5 mg daily Farxiga 10 mg daily Digoxin 0.125 mg daily Furosemide 20 mg daily  Prior to admission HF Medications:  None (non-adherence)  Assessment: 1. Acute on chronic systolic heart failure (LVEF in 2023 40-45%), due to NICM. NYHA class III-IV symptoms.  -BP improved. Losartan increased by cardiology. Can transition to [facility] 8/15 if BP stable. -COOX 74.6 this AM on milrinone 0.0625. Milrinone stopped and repeat COOX ordered for this afternoon. -Dry weight appears to be ~190, currently 195 lbs. Reports improved energy, appetite, and breathing. -CVP 8 this AM. Okay on current diuretic dose for now.   Plan: 1) Medication changes recommended at this time: -Consider increasing spironolactone 25 mg daily -Consider swapping from [place] to [place] due to insurance formulary.  2) Patient assistance: -Patient qualifies for copay cards with commercial insurance.  -  Deductible is $7,500, therefore copay cards are essential before discharge. Entresto copay cards help cover deductible. Sherryll Burger copay: $656.56 -Jardiance requires prior authorization -Xarelto copay: $543.58  3) Education: -To be completed prior to discharge.  Medication Assistance / Insurance Benefits Check:  Does the patient have prescription insurance? Prescription Insurance: Commercial ([employer])  Does the patient qualify for medication assistance through  manufacturers or grants? Pending   Eligible grants and/or patient assistance programs: pending   Medication assistance applications in progress: pending   Medication assistance applications approved: pending  Outpatient Pharmacy:  Prior to admission outpatient pharmacy: none  Is the patient willing to use [facility] pharmacy at discharge? Yes  Is the patient willing to transition their outpatient pharmacy to utilize a [facility] outpatient pharmacy?   TBD  Thank you for involving pharmacy in this patient's care.  Enos Fling, PharmD, BCPS Clinical Pharmacist 10/29/2022 7:43 AM

## 2022-10-30 ENCOUNTER — Inpatient Hospital Stay: Payer: 59

## 2022-10-30 DIAGNOSIS — R079 Chest pain, unspecified: Secondary | ICD-10-CM | POA: Diagnosis not present

## 2022-10-30 DIAGNOSIS — I509 Heart failure, unspecified: Secondary | ICD-10-CM | POA: Diagnosis not present

## 2022-10-30 DIAGNOSIS — I4891 Unspecified atrial fibrillation: Secondary | ICD-10-CM | POA: Diagnosis not present

## 2022-10-30 DIAGNOSIS — R0609 Other forms of dyspnea: Secondary | ICD-10-CM | POA: Diagnosis not present

## 2022-10-30 DIAGNOSIS — Z91148 Patient's other noncompliance with medication regimen for other reason: Secondary | ICD-10-CM | POA: Diagnosis not present

## 2022-10-30 LAB — BASIC METABOLIC PANEL
Anion gap: 10 (ref 5–15)
BUN: 18 mg/dL (ref 6–20)
CO2: 25 mmol/L (ref 22–32)
Calcium: 9 mg/dL (ref 8.9–10.3)
Chloride: 102 mmol/L (ref 98–111)
Creatinine, Ser: 0.91 mg/dL (ref 0.61–1.24)
GFR, Estimated: >60 mL/min (ref >60)
Glucose, Bld: 116 mg/dL — ABNORMAL HIGH (ref 70–99)
Potassium: 4.2 mmol/L (ref 3.5–5.1)
Sodium: 137 mmol/L (ref 135–145)

## 2022-10-30 LAB — CBC
HCT: 44.6 % (ref 39.0–52.0)
Hemoglobin: 14.3 g/dL (ref 13.0–17.0)
MCH: 28.9 pg (ref 26.0–34.0)
MCHC: 32.1 g/dL (ref 30.0–36.0)
MCV: 90.1 fL (ref 80.0–100.0)
Platelets: 178 10*3/uL (ref 150–400)
RBC: 4.95 MIL/uL (ref 4.22–5.81)
RDW: 17 % — ABNORMAL HIGH (ref 11.5–15.5)
WBC: 8.8 10*3/uL (ref 4.0–10.5)
nRBC: 0 % (ref 0.0–0.2)

## 2022-10-30 LAB — COOXEMETRY PANEL
Carboxyhemoglobin: 0.8 % (ref 0.5–1.5)
Methemoglobin: <0.7 % (ref 0.0–1.5)
O2 Saturation: 58.9 %
Total hemoglobin: 14.6 g/dL (ref 12.0–16.0)
Total oxygen content: 58.1 %

## 2022-10-30 LAB — HEPARIN LEVEL (UNFRACTIONATED)
Heparin Unfractionated: 0.84 [IU]/mL — ABNORMAL HIGH (ref 0.30–0.70)
Heparin Unfractionated: 0.92 [IU]/mL — ABNORMAL HIGH (ref 0.30–0.70)
Heparin Unfractionated: 0.37 [IU]/mL (ref 0.30–0.70)

## 2022-10-30 LAB — DIGOXIN LEVEL: Digoxin Level: 0.5 ng/mL — ABNORMAL LOW (ref 0.8–2.0)

## 2022-10-30 LAB — MAGNESIUM: Magnesium: 2.6 mg/dL — ABNORMAL HIGH (ref 1.7–2.4)

## 2022-10-30 MED ORDER — BISACODYL 10 MG RE SUPP: 10.0000 mg | Freq: Every day | RECTAL | Status: DC | PRN

## 2022-10-30 MED ORDER — DOCUSATE SODIUM 100 MG PO CAPS: 100.0000 mg | ORAL_CAPSULE | Freq: Every day | ORAL | Status: DC

## 2022-10-30 MED ORDER — SPIRONOLACTONE 12.5 MG HALF TABLET
12.5000 mg | ORAL_TABLET | Freq: Every day | ORAL | Status: DC
  Administered 2022-10-30 – 2022-10-31 (×2): 12.5 mg via ORAL
  Filled 2022-10-30 (×2): qty 1

## 2022-10-30 MED ORDER — LOSARTAN POTASSIUM 25 MG PO TABS
12.5000 mg | ORAL_TABLET | Freq: Every day | ORAL | Status: DC
  Administered 2022-10-30 – 2022-10-31 (×2): 12.5 mg via ORAL
  Filled 2022-10-30: qty 1
  Filled 2022-10-30: qty 0.5

## 2022-10-30 MED ORDER — LACTULOSE 10 GM/15ML PO SOLN
10.0000 g | Freq: Every day | ORAL | Status: DC | PRN
  Administered 2022-10-30: 10 g via ORAL
  Filled 2022-10-30: qty 30

## 2022-10-30 MED ORDER — MORPHINE SULFATE (PF) 2 MG/ML IV SOLN
1.0000 mg | Freq: Once | INTRAVENOUS | Status: AC
  Administered 2022-10-30: 1 mg via INTRAVENOUS
  Filled 2022-10-30: qty 1

## 2022-10-30 MED ORDER — SORBITOL 70 % SOLN: 960.0000 mL | TOPICAL_OIL | Freq: Once | ORAL | Status: DC | PRN

## 2022-10-30 MED ORDER — POLYETHYLENE GLYCOL 3350 17 G PO PACK
17.0000 g | PACK | Freq: Two times a day (BID) | ORAL | Status: DC
  Administered 2022-10-30: 17 g via ORAL
  Filled 2022-10-30 (×2): qty 1

## 2022-10-30 NOTE — Consult Note (Signed)
ANTICOAGULATION CONSULT NOTE  Pharmacy Consult for Heparin Infusion Indication: atrial fibrillation  No Known Allergies  Patient Measurements: Height: 5\' 10"  (177.8 cm) Weight: 88.3 kg (194 lb 10.7 oz) IBW/kg (Calculated) : 73 Heparin Dosing Weight: 88.5 kg  Vital Signs: Temp: 97.8 F (36.6 C) (08/15 1929) Temp Source: Oral (08/15 1544) BP: 113/75 (08/15 1929) Pulse Rate: 101 (08/15 1929)  Labs: Recent Labs    10/28/22 0531 10/28/22 0630 10/29/22 0146 10/30/22 0401 10/30/22 1141 10/30/22 2003  HGB 14.6  --  14.6 14.3  --   --   HCT 45.8  --  44.3 44.6  --   --   PLT 186  --  177 178  --   --   HEPARINUNFRC  --    < > 0.67 0.84* 0.92* 0.37  CREATININE 1.10  --  1.14 0.91  --   --    < > = values in this interval not displayed.    Estimated Creatinine Clearance: 96.6 mL/min (by C-G formula based on SCr of 0.91 mg/dL).   Medical History: Past Medical History:  Diagnosis Date   Alcohol abuse    Chronic pain    COVID-19    a. 02/2019   HFrEF (heart failure with reduced ejection fraction) (HCC)    a. 05/2019 Echo: EF 35-40%; b. 07/2021 Echo: EF 40-45%; c. 10/2022 Echo: EF 20-25%, glob HK, mildly reduced RV fxn, sev dil LA, mid-mod MR, mild AI.   History of medication noncompliance    Longstanding persistent atrial fibrillation (HCC)    a. Dx 03/2018; b. 05/2019 recurrent Afib in setting of PE. CHA2DS2VASc = 1-->Eliquis later changed to xarelto; b. 07/2019 s/p DCCV (150J); d. 08/2019 admit w/ AF RVR in settting of noncompliance - longstanding persistent afib since on bb/ccb rx.   NICM (nonischemic cardiomyopathy) (HCC)    a. 05/2019 Echo: EF 35-40%; b. 05/2019 MV: EF 32%, no ischemia; c. 07/2021 Echo: EF 40-45%; d. 10/2022 Echo: EF 20-25%, glob HK.   Pulmonary embolism (HCC)    a. 05/2019 CTA Chest: mild amt of PE w/in a lower lobe branch of RPA; b. 08/2019 CTA Chest: Small PE in RML and RLL PA.    Medications:  Patient hasn't been taking Rivaroxaban since January due to  insurance reasons  Also not on any rate control medications for same reason (Digoxin and Metoprolol)  Assessment: Calvin Esparza is a 62 y.o. male with a history of afib was on rivaroxaban PTA with RVR and PE not taking any of his medications since January of 2024 due to loss of insurance. Presented to the ED with a CHF exacerbation, dyspnea, peripheral edema and weight gain. Baseline heparin level was < 0.10, so we will monitor heparin using heparin level. CHADS-VASc: 4. Pharmacy consulted for initiate and manage heparin infusion.   Baseline Labs: aPTT 27, HL <0.10, PT 15.1, INR 1.2, Hgb 14.3, Hct 43.5, Plt 228   Goal of Therapy:  Heparin level 0.3-0.7 units/ml Monitor platelets by anticoagulation protocol: Yes   Date Time HL Rate/Comment  8/9 1153 0.44 1350/therapeutic x1 8/9 1808 0.52 1350/therapeutic x2 8/10 0539 0.42 1350/therapeutic  8/11 0422 0.40 1350/therapeutic  8/12 0440 0.39 1350/therapeutic  8/12     2259    0.16    1350/SUBtherapeutic  8/13     0630    0.70  1700/therapeutic x1  8/13 1211 0.75 1700/SUPRAtherapeutic 8/13 2009 0.67  8/14     0146    0.67     1550/therapeutic x2  8/15     0401    0.84     1550/SUPRAtherapeutic  8/15 1141 0.92 1400/SUPRAtherapeutic 8/15     2008 0.37 1200/therapeutic x1  Plan:  - Will continue heparin drip rate at 1200 units/hr - Recheck HL in 6 hours - CBC daily   Netta Neat, PharmD Clinical Pharmacist 10/30/2022 8:38 PM

## 2022-10-30 NOTE — Progress Notes (Signed)
       CROSS COVER NOTE  NAME: Calvin Esparza MRN: [id number] DOB : May 04, 1960    Concern as stated by nurse / staff   Patient reported new onset abdominal pain described as "stomach in knots" 10/10 and feels constipated. Reported only 1 very small BM since admit 5 days ago Abd pain accompanied by vomiting     Pertinent findings on chart review: H & P labs reviewed  Assessment and  Interventions   Assessment:    10/30/2022    3:58 AM 10/30/2022    3:00 AM 10/30/2022    2:00 AM  Vitals with BMI  Weight 194 lbs 11 oz    BMI 27.93    Systolic  107 107  Diastolic  73 73  Pulse  94 100   Afebrile KUB without obstruction per rad report, per my review of image, appears to be some bowel dilitation likely reflecting his constipation Plan: Add colace nightly with daily miralax already ordered for bowel regimen Dulcolax suppository daily prn constipation Morphine 1 mg  IV x1 dose     Donnie Mesa NP [facility] Cross Cover 7pm-7am - check amion for availability Pager [phone]

## 2022-10-30 NOTE — Progress Notes (Addendum)
[facility] HF Stewardship  PCP: Lauro Regulus, MD  PCP-Cardiologist: Julien Nordmann, MD  HPI: Calvin Esparza is a 62 y.o. male with CHF, HTN, HLD, gout, PE and PAF on Xarelto, alcohol abuse, presented with worsening of fatigue, exertional dyspnea, and increasing leg swelling. Echo in 2021 showed LVEF of 35-40%. LVEF increased to 40-45% in 07/2021. New echo pending. Patient has been off HF medications since January due to cost and experienced progression of symptoms starting in June. BNP 910 on admission. Diuresed with IV Lasix. Transitioned to PO diuretic after mild creatine bump and symptom improvement. LHC on 10/27/22 showed no obstructive CAD. RHC 10/27/22 showed RA 8, PA 23/16 (18), PCWP 16, CO 2.7, CI 1.4, PAPi 0.9. Given low output heart failure, milrinone started 8/12 and transferred to ICU. Milrinone weaned off on 8/14. COOX off milrinone decreased to 58%.  Pertinent Lab Values: BUN  Date Value Ref Range Status  10/30/2022 18 6 - 20 mg/dL Final  16/12/9602 22 6 - 24 mg/dL Final   Potassium  Date Value Ref Range Status  10/30/2022 4.2 3.5 - 5.1 mmol/L Final   Sodium  Date Value Ref Range Status  10/30/2022 137 135 - 145 mmol/L Final  12/03/2020 140 134 - 144 mmol/L Final   B Natriuretic Peptide  Date Value Ref Range Status  10/24/2022 910.5 (H) 0.0 - 100.0 pg/mL Final    Comment:    Performed at [facility], [address] [place], [place] [address]   Magnesium  Date Value Ref Range Status  10/30/2022 2.6 (H) 1.7 - 2.4 mg/dL Final    Comment:    Performed at [facility], [address] [place], [place] [address]   TSH  Date Value Ref Range Status  02/25/2020 1.449 0.350 - 4.500 uIU/mL Final    Comment:    Performed by a 3rd Generation assay with a functional sensitivity of <=0.01 uIU/mL. Performed at [facility], [address] [place], [place] [address]     Vital Signs: Temp:  [97.8 F (36.6 C)-98.4 F (36.9 C)] 97.8 F  (36.6 C) (08/15 0800) Pulse Rate:  [42-106] 62 (08/15 0900) Cardiac Rhythm: Atrial fibrillation (08/15 0802) Resp:  [3-26] 23 (08/15 0900) BP: (86-125)/(61-93) 94/80 (08/15 0900) SpO2:  [91 %-98 %] 97 % (08/15 0900) Weight:  [88.3 kg (194 lb 10.7 oz)] 88.3 kg (194 lb 10.7 oz) (08/15 0358)   Intake/Output Summary (Last 24 hours) at 10/30/2022 1013 Last data filed at 10/30/2022 0800 Gross per 24 hour  Intake 728.27 ml  Output 1601 ml  Net -872.73 ml    Current HF Medications:  Metoprolol succinate 12.5 mg daily Losartan 12.5 mg daily Spironolactone 12.5 mg daily Farxiga 10 mg daily Digoxin 0.125 mg daily Furosemide 20 mg daily  Prior to admission HF Medications:  None (non-adherence)  Assessment: 1. Acute on chronic systolic heart failure (LVEF in 2023 40-45%), due to NICM. NYHA class III-IV symptoms.  -BP softer after increasing losartan.  -COOX 58% this AM off milrinone. BB likely contributing to poor COOX. -Dry weight appears to be ~190, currently 194 lbs. CVPs overnight inaccurate. Reports improved energy, appetite, and breathing. On toilet this AM - unable to assess.  Plan: 1) Medication changes recommended at this time: -Consider holding metoprolol succinate with low output HF and low BP. -Consider swapping from [place] to [place] due to insurance formulary.  2) Patient assistance: -Patient qualifies for copay cards with commercial insurance.  -Deductible is $7,500, therefore copay cards are  essential before discharge. Entresto copay cards help cover deductible. Sherryll Burger copay: $656.56 -Jardiance requires prior authorization -Xarelto copay: $543.58  3) Education: -To be completed prior to discharge.  Medication Assistance / Insurance Benefits Check:  Does the patient have prescription insurance? Prescription Insurance: Commercial ([employer]na/[employer])  Does the patient qualify for medication assistance through manufacturers or grants? Pending   Eligible grants  and/or patient assistance programs: pending   Medication assistance applications in progress: pending   Medication assistance applications approved: pending  Outpatient Pharmacy:  Prior to admission outpatient pharmacy: none  Is the patient willing to use [facility] pharmacy at discharge? Yes  Is the patient willing to transition their outpatient pharmacy to utilize a [facility] outpatient pharmacy?   TBD  Thank you for involving pharmacy in this patient's care.  Enos Fling, PharmD, BCPS Clinical Pharmacist 10/30/2022 10:13 AM

## 2022-10-30 NOTE — Consult Note (Signed)
ANTICOAGULATION CONSULT NOTE  Pharmacy Consult for Heparin Infusion Indication: atrial fibrillation  No Known Allergies  Patient Measurements: Height: 5\' 10"  (177.8 cm) Weight: 88.3 kg (194 lb 10.7 oz) IBW/kg (Calculated) : 73 Heparin Dosing Weight: 88.5 kg  Vital Signs: Temp: 97.8 F (36.6 C) (08/15 0800) Temp Source: Oral (08/15 0200) BP: 109/77 (08/15 1100) Pulse Rate: 116 (08/15 1100)  Labs: Recent Labs    10/28/22 0531 10/28/22 0630 10/29/22 0146 10/30/22 0401 10/30/22 1141  HGB 14.6  --  14.6 14.3  --   HCT 45.8  --  44.3 44.6  --   PLT 186  --  177 178  --   HEPARINUNFRC  --    < > 0.67 0.84* 0.92*  CREATININE 1.10  --  1.14 0.91  --    < > = values in this interval not displayed.    Estimated Creatinine Clearance: 96.6 mL/min (by C-G formula based on SCr of 0.91 mg/dL).   Medical History: Past Medical History:  Diagnosis Date   Alcohol abuse    Chronic pain    COVID-19    a. 02/2019   HFrEF (heart failure with reduced ejection fraction) (HCC)    a. 05/2019 Echo: EF 35-40%; b. 07/2021 Echo: EF 40-45%; c. 10/2022 Echo: EF 20-25%, glob HK, mildly reduced RV fxn, sev dil LA, mid-mod MR, mild AI.   History of medication noncompliance    Longstanding persistent atrial fibrillation (HCC)    a. Dx 03/2018; b. 05/2019 recurrent Afib in setting of PE. CHA2DS2VASc = 1-->Eliquis later changed to xarelto; b. 07/2019 s/p DCCV (150J); d. 08/2019 admit w/ AF RVR in settting of noncompliance - longstanding persistent afib since on bb/ccb rx.   NICM (nonischemic cardiomyopathy) (HCC)    a. 05/2019 Echo: EF 35-40%; b. 05/2019 MV: EF 32%, no ischemia; c. 07/2021 Echo: EF 40-45%; d. 10/2022 Echo: EF 20-25%, glob HK.   Pulmonary embolism (HCC)    a. 05/2019 CTA Chest: mild amt of PE w/in a lower lobe branch of RPA; b. 08/2019 CTA Chest: Small PE in RML and RLL PA.    Medications:  Patient hasn't been taking Rivaroxaban since January due to insurance reasons  Also not on any rate  control medications for same reason (Digoxin and Metoprolol)  Assessment: Calvin Esparza is a 62 y.o. male with a history of afib was on rivaroxaban PTA with RVR and PE not taking any of his medications since January of 2024 due to loss of insurance. Presented to the ED with a CHF exacerbation, dyspnea, peripheral edema and weight gain. Baseline heparin level was < 0.10, so we will monitor heparin using heparin level. CHADS-VASc: 4. Pharmacy consulted for initiate and manage heparin infusion.   Baseline Labs: aPTT 27, HL <0.10, PT 15.1, INR 1.2, Hgb 14.3, Hct 43.5, Plt 228   Goal of Therapy:  Heparin level 0.3-0.7 units/ml Monitor platelets by anticoagulation protocol: Yes   Date Time HL Rate/Comment  8/9 1153 0.44 1350/therapeutic x1 8/9 1808 0.52 1350/therapeutic x2 8/10 0539 0.42 1350/therapeutic  8/11 0422 0.40 1350/therapeutic  8/12 0440 0.39 1350/therapeutic  8/12     2259    0.16    1350/SUBtherapeutic  8/13     0630    0.70  1700/Therapeutic X 1  8/13 1211 0.75 1700/SUPRAtherapeutic 8/13 2009 0.67  8/14     0146    0.67     1550/therapeutic X 2  8/15     0401    0.84  1550/SUPRAtherapeutic  8/15 1141 0.92 1400/SUPRAtherapeutic  Plan:  - Will decrease heparin drip rate to 1200 units/hr - recheck HL 6 hrs after rate change  - CBC daily  Bettey Costa, PharmD Clinical Pharmacist 10/30/2022 1:11 PM

## 2022-10-30 NOTE — Progress Notes (Signed)
PROGRESS NOTE Calvin Esparza   WJX:[id number] DOB: [birth year]  DOA: 10/24/2022 Date of Service: 10/30/22 PCP: Lauro Regulus, MD  Brief Narrative / Hospital Course:  Calvin Esparza is a 62 y.o. male with medical history significant of chronic HFrEF with LVEF 30-40%, HTN, HLD, gout, PE and PAF on Xarelto, alcohol abuse, presented with worsening of fatigue, exertional dyspnea, and increasing leg swelling. Patient stopped taking all his medications since January this year due to insurance coverage issue. Started about 2 months ago, patient started to feel fatigued, exertional dyspnea and gradually getting worse.  08/09: to ED. chest x-ray showed cardiomegaly and pulmonary congestion, CT angiogram negative for PE. Creatinine 1.2, K3.8, WBC 7.2, hemoglobin 14.3. HR 170's. Tx w/ po metoprolol and IV lasix. Admitted for CHF, Afib.  08/10: HR remains elevated 110s. BP improved. Net IO Since Admission: -3L. Cr 1.04. Echo reviewed - EF 20-25%, global hypokinesis, indeterminate diastolic parameters, RV fxn reduced, mild pulm HTN, RA and LA dilation, mild/mod MR (compared to Echo 07/2021 w/ EF 40-45%). Cardio consult - plan for cardiac cath.  08/11: pending cath, continue diuresis, Net IO Since Admission: -6L 08/12: Net IO Since Admission: -7.5L. Cardiac cath showing significantly reduced cardiac output. Transferring to SDU for milrinone gtt   08/13: stable on milrinone.  8/14: discontinued milrinone and remains stable but borderline low BP. Has not been out of bed today yet. Event late at night with abdominal pain related to constipation worsened by bed-ridden and opioid medications 8/15: remains stable cardiac status with medication adjustments being made for soft blood pressures. Increased bowel regimen. Transfer out of step down   Consultants:  Cardiology    Procedures: 10/27/22 cardiac cath     ASSESSMENT & PLAN:   Acute on chronic HFrEF decompensation  Pulmonary HTN  Afib RVR POA- HR  well controlled at rest. Digoxin level 0.5 today Cardiology following, appreciate your care Dc'd milrinone Continue to monitor Cox Continue metoprolol, digoxin, losartan Continue diuresis- volume status greatly improved.  decrease spironolactone Continue heparin gtt. Eventually transition to eliquis when no procedure verified Strict I&O, fluid restriction diet  Farxiga  Constipation-  - continue miralax, senna, lactulose - PRN suppository and enema ordered - consider KUB if not improving    History of cocaine use UDS ordered, pending    Alcohol abuse No symptoms or signs of active withdrawal CIWA protocol with as needed benzo Thiamine, Folic acid    History of PE CTA Chest negative this admission for PE Anticoag as above see Afib     DVT prophylaxis: heparin per cardiology --> likely to change to oral today. No planned procedures Central lines / invasive devices: none Code Status: FULL CODE ACP documentation reviewed   Current Admission Status: inpatient   TOC needs / Dispo plan: TBD Barriers to discharge / significant pending items: further medical stabilization  Subjective / Brief ROS:  Patient reports doing well. Had severe abdominal pain and endorses several days since he had a good BM. Had a small one yesterday. Denies any past abdominal surgeries.   Family Communication: none at bedside  Objective Findings: Blood pressure 96/61, pulse 90, temperature 98.1 F (36.7 C), resp. rate 19, height 5\' 10"  (1.778 m), weight 88.7 kg, SpO2 92%.  Examination:  Physical Exam Vitals reviewed.  Constitutional:      General: He is not in acute distress.    Appearance: He is well-developed.  Cardiovascular:     Rate and Rhythm: Regular rhythm. Tachycardia present.  Heart sounds: Murmur heard.     Systolic murmur is present.  Pulmonary:     Effort: Pulmonary effort is normal. No respiratory distress.     Breath sounds: Normal breath sounds.  Abdominal:     General:  Bowel sounds are normal.     Palpations: Abdomen is soft.     Tenderness: There is no abdominal tenderness.  Skin:    General: Skin is warm and dry.  Neurological:     General: No focal deficit present.     Mental Status: He is alert and oriented to person, place, and time.  Psychiatric:        Mood and Affect: Mood normal.        Behavior: Behavior normal.   Data Reviewed:  I have personally reviewed the following...  CBC: Recent Labs  Lab 10/25/22 0539 10/27/22 0440 10/27/22 1129 10/27/22 1132 10/28/22 0531 10/29/22 0146 10/30/22 0401  WBC 5.1 6.1  --   --  5.5 6.7 8.8  HGB 13.5 14.9 16.3 16.7 14.6 14.6 14.3  HCT 40.8 44.5 48.0 49.0 45.8 44.3 44.6  MCV 86.4 85.6  --   --  89.8 87.5 90.1  PLT 179 192  --   --  186 177 178   Basic Metabolic Panel: Recent Labs  Lab 10/26/22 0422 10/27/22 0440 10/27/22 1129 10/27/22 1132 10/28/22 0531 10/29/22 0146 10/30/22 0401  NA 134* 135 140 138 136 136 137  K 3.5 3.6 3.8 3.9 3.5 3.8 4.2  CL 98 99  --   --  100 102 102  CO2 25 27  --   --  29 27 25   GLUCOSE 103* 101*  --   --  127* 146* 116*  BUN 23* 21*  --   --  20 21* 18  CREATININE 1.21 1.08  --   --  1.10 1.14 0.91  CALCIUM 8.8* 9.0  --   --  8.8* 8.7* 9.0  MG  --   --   --   --  2.6* 2.4 2.6*   Radiology Studies last 3 days: DG Abd 1 View  Result Date: 10/30/2022 CLINICAL DATA:  Abdominal pain with nausea and vomiting, initial encounter EXAM: ABDOMEN - 1 VIEW COMPARISON:  None Available. FINDINGS: Scattered large and small bowel gas is noted. No obstructive changes are seen. Mild scoliosis concave to the left is noted in the lumbar spine. IMPRESSION: No acute abnormality noted. Electronically Signed   By: Alcide Clever M.D.   On: 10/30/2022 02:25   DG Chest Port 1 View  Result Date: 10/27/2022 CLINICAL DATA:  PICC line placement EXAM: PORTABLE CHEST 1 VIEW COMPARISON:  10/25/2018 FINDINGS: Left PICC line tip: Lower SVC. No pneumothorax or complicating feature. Moderate  enlargement of the cardiopericardial silhouette. The lungs appear clear. No blunting of the costophrenic angles. Thoracic spondylosis. IMPRESSION: 1. Left PICC line tip: Lower SVC. 2. Moderate enlargement of the cardiopericardial silhouette. Electronically Signed   By: Gaylyn Rong M.D.   On: 10/27/2022 15:32   [place] EKG Site Rite  Result Date: 10/27/2022 If Site Rite image not attached, placement could not be confirmed due to current cardiac rhythm.  CARDIAC CATHETERIZATION  Result Date: 10/27/2022 Conclusions: No angiographically significant coronary artery disease. Upper normal left and right heart filling pressures. Severely reduced Fick cardiac output/index.  Low PAPi suggests a component of right heart failure as well. Recommendations: Transfer to stepdown and initiate milrinone therapy. Decrease metoprolol succinate in the setting of low-output heart failure. Maintain  net even fluid balance. If cardiac output cannot be improved and/or milrinone cannot be weaned off, will need to consider transfer to [facility] for further management by the advanced heart failure team. Yvonne Kendall, MD [facility]     LOS: 6 days   .  Leeroy Bock, DO [facility] 10/30/2022, 8:07 AM    If 7PM-7AM, please contact night coverage [email or web address]

## 2022-10-30 NOTE — Progress Notes (Signed)
Rounding Note    Patient Name: Calvin Esparza Date of Encounter: 10/30/2022  [facility] Cardiologist: Julien Nordmann, MD   Subjective   Sitting up in a chair, reports he remains constipated Severe abdominal pain last night, cross cover called for a.m. for severe abdominal pain requiring morphine Also given MiraLAX, Colace, lactulose Only small bowel movement yesterday, feels more to go Denies significant shortness of breath Blood pressure low 92 systolic, morning medications held so far Nurses report sinus tachycardia on exertion yesterday  Inpatient Medications    Scheduled Meds:  Chlorhexidine Gluconate Cloth  6 each Topical Daily   colchicine  0.6 mg Oral BID   dapagliflozin propanediol  10 mg Oral QAC breakfast   digoxin  0.125 mg Oral Daily   folic acid  1 mg Oral Daily   furosemide  20 mg Oral Daily   losartan  12.5 mg Oral Daily   multivitamin with minerals  1 tablet Oral Daily   polyethylene glycol  17 g Oral BID   sodium chloride flush  10-40 mL Intracatheter Q12H   spironolactone  12.5 mg Oral Daily   thiamine  100 mg Oral Daily   Or   thiamine  100 mg Intravenous Daily   Continuous Infusions:  heparin 1,400 Units/hr (10/30/22 1231)   PRN Meds: acetaminophen, bisacodyl, lactulose, ondansetron (ZOFRAN) IV, sodium chloride flush, sorbitol, milk of mag, mineral oil, glycerin (SMOG) enema, traZODone   Vital Signs    Vitals:   10/30/22 0800 10/30/22 0900 10/30/22 1000 10/30/22 1100  BP:  94/80 91/70 109/77  Pulse: 97 62 91 (!) 116  Resp:  (!) 23 16   Temp: 97.8 F (36.6 C)     TempSrc:      SpO2: 97% 97% 95% 92%  Weight:      Height:        Intake/Output Summary (Last 24 hours) at 10/30/2022 1340 Last data filed at 10/30/2022 0800 Gross per 24 hour  Intake 488.27 ml  Output 1300 ml  Net -811.73 ml      10/30/2022    3:58 AM 10/29/2022    5:00 AM 10/28/2022    5:00 AM  Last 3 Weights  Weight (lbs) 194 lb 10.7 oz 195 lb 8.8 oz 190  lb 11.2 oz  Weight (kg) 88.3 kg 88.7 kg 86.5 kg      Telemetry    Atrial fibrillation- Personally Reviewed  ECG     - Personally Reviewed  Physical Exam   GEN: No acute distress.   Neck: No JVD Cardiac: Irregularly irregular, no murmurs, rubs, or gallops.  Respiratory: Clear to auscultation bilaterally. GI: Soft, nontender, non-distended  MS: No edema; No deformity. Neuro:  Nonfocal  Psych: Normal affect   Labs    High Sensitivity Troponin:   Recent Labs  Lab 10/24/22 0513 10/24/22 0732  TROPONINIHS 15 14     Chemistry Recent Labs  Lab 10/28/22 0531 10/29/22 0146 10/30/22 0401  NA 136 136 137  K 3.5 3.8 4.2  CL 100 102 102  CO2 29 27 25   GLUCOSE 127* 146* 116*  BUN 20 21* 18  CREATININE 1.10 1.14 0.91  CALCIUM 8.8* 8.7* 9.0  MG 2.6* 2.4 2.6*  GFRNONAA >60 >60 >60  ANIONGAP 7 7 10     Lipids No results for input(s): "CHOL", "TRIG", "HDL", "LABVLDL", "LDLCALC", "CHOLHDL" in the last 168 hours.  Hematology Recent Labs  Lab 10/28/22 0531 10/29/22 0146 10/30/22 0401  WBC 5.5 6.7 8.8  RBC  5.10 5.06 4.95  HGB 14.6 14.6 14.3  HCT 45.8 44.3 44.6  MCV 89.8 87.5 90.1  MCH 28.6 28.9 28.9  MCHC 31.9 33.0 32.1  RDW 16.7* 16.8* 17.0*  PLT 186 177 178   Thyroid No results for input(s): "TSH", "FREET4" in the last 168 hours.  BNP Recent Labs  Lab 10/24/22 0513  BNP 910.5*    DDimer No results for input(s): "DDIMER" in the last 168 hours.   Radiology    DG Abd 1 View  Result Date: 10/30/2022 CLINICAL DATA:  Abdominal pain with nausea and vomiting, initial encounter EXAM: ABDOMEN - 1 VIEW COMPARISON:  None Available. FINDINGS: Scattered large and small bowel gas is noted. No obstructive changes are seen. Mild scoliosis concave to the left is noted in the lumbar spine. IMPRESSION: No acute abnormality noted. Electronically Signed   By: Alcide Clever M.D.   On: 10/30/2022 02:25    Cardiac Studies   Echo . Left ventricular ejection fraction, by estimation,  is 20 to 25%. Left  ventricular ejection fraction by PLAX is 23 %. The left ventricle has  severely decreased function. The left ventricle demonstrates global  hypokinesis. Left ventricular diastolic  parameters are indeterminate.   2. Right ventricular systolic function is mildly reduced. The right  ventricular size is mildly enlarged. There is mildly elevated pulmonary  artery systolic pressure.   3. Left atrial size was severely dilated.   4. Right atrial size was mildly dilated.   5. The mitral valve is normal in structure. Mild to moderate mitral valve  regurgitation.   6. The aortic valve is tricuspid. Aortic valve regurgitation is mild.   7. The inferior vena cava is dilated in size with >50% respiratory  variability, suggesting right atrial pressure of 8 mmHg.    Patient Profile   62 y.o. male with a history of NICM, chronic HFrEF, permanent atrial fibrillation, PE in 2021, noncompliance, EtOH abuse, gout, chronic pain, who was admitted on 8/9 with volume overload and rapid atrial fibrillation in the setting of noncompliance   Assessment & Plan    Acute on chronic diastolic and systolic CHF/nonischemic cardiomyopathy EF 20 to 25% on echocardiogram this admission down from 40 to 45% on prior study 2023 -Treated with milrinone infusion, weaned off yesterday morning -Co. ox stable 58.9 Medication titration limited secondary to hypotension -Will decrease losartan down to 12.5 daily, spironolactone 12.5 daily, continue digoxin, Farxiga (changed to [place] on discharge for insurance purposes)  Lasix 20 daily  -Permanent atrial fibrillation -Rate controlled with digoxin and metoprolol succinate -Heparin infusion with plan to transition back to Eliquis  Polysubstance abuse History of alcohol, cocaine Cessation recommended   Total encounter time more than 50 minutes  Greater than 50% was spent in counseling and coordination of care with the patient   For questions or  updates, please contact [facility] Please consult [email or web address] for contact info under        Signed, Julien Nordmann, MD  10/30/2022, 1:40 PM

## 2022-10-30 NOTE — Progress Notes (Signed)
Around 0130, patient called RN to room, complaining of upper epigastric pain and nausea. Patient stated that his stomach was "in knots," sitting on side of bed, unsure if it was related to needing to have a BM or not. Zofran given, but patient ended up vomiting. Patient sat on BSC but did not have BM. NP notified and KUB ordered for assessment. 1 mg morphine ordered for pain, which was effective. Patient felt more comfortable and was able to rest after pain med given.  Carmel Sacramento, RN

## 2022-10-30 NOTE — Progress Notes (Signed)
Good day. Up in the chair all day. Had 3 BMs but patient said he was still full of poop. 1515 Report called to [facility] RN on [phone] Patient transferred to room 241 via bed.

## 2022-10-30 NOTE — Consult Note (Signed)
ANTICOAGULATION CONSULT NOTE  Pharmacy Consult for Heparin Infusion Indication: atrial fibrillation  No Known Allergies  Patient Measurements: Height: 5\' 10"  (177.8 cm) Weight: 88.3 kg (194 lb 10.7 oz) IBW/kg (Calculated) : 73 Heparin Dosing Weight: 88.5 kg  Vital Signs: Temp: 98.1 F (36.7 C) (08/15 0200) Temp Source: Oral (08/15 0200) BP: 108/79 (08/15 0400) Pulse Rate: 100 (08/15 0400)  Labs: Recent Labs    10/28/22 0531 10/28/22 0630 10/28/22 2009 10/29/22 0146 10/30/22 0401  HGB 14.6  --   --  14.6 14.3  HCT 45.8  --   --  44.3 44.6  PLT 186  --   --  177 178  HEPARINUNFRC  --    < > 0.67 0.67 0.84*  CREATININE 1.10  --   --  1.14 0.91   < > = values in this interval not displayed.    Estimated Creatinine Clearance: 96.6 mL/min (by C-G formula based on SCr of 0.91 mg/dL).   Medical History: Past Medical History:  Diagnosis Date   Alcohol abuse    Chronic pain    COVID-19    a. 02/2019   HFrEF (heart failure with reduced ejection fraction) (HCC)    a. 05/2019 Echo: EF 35-40%; b. 07/2021 Echo: EF 40-45%; c. 10/2022 Echo: EF 20-25%, glob HK, mildly reduced RV fxn, sev dil LA, mid-mod MR, mild AI.   History of medication noncompliance    Longstanding persistent atrial fibrillation (HCC)    a. Dx 03/2018; b. 05/2019 recurrent Afib in setting of PE. CHA2DS2VASc = 1-->Eliquis later changed to xarelto; b. 07/2019 s/p DCCV (150J); d. 08/2019 admit w/ AF RVR in settting of noncompliance - longstanding persistent afib since on bb/ccb rx.   NICM (nonischemic cardiomyopathy) (HCC)    a. 05/2019 Echo: EF 35-40%; b. 05/2019 MV: EF 32%, no ischemia; c. 07/2021 Echo: EF 40-45%; d. 10/2022 Echo: EF 20-25%, glob HK.   Pulmonary embolism (HCC)    a. 05/2019 CTA Chest: mild amt of PE w/in a lower lobe branch of RPA; b. 08/2019 CTA Chest: Small PE in RML and RLL PA.    Medications:  Patient hasn't been taking Rivaroxaban since January due to insurance reasons  Also not on any rate  control medications for same reason (Digoxin and Metoprolol)  Assessment: Calvin Esparza is a 62 y.o. male with a history of afib was on rivaroxaban PTA with RVR and PE not taking any of his medications since January of 2024 due to loss of insurance. Presented to the ED with a CHF exacerbation, dyspnea, peripheral edema and weight gain. Baseline heparin level was < 0.10, so we will monitor heparin using heparin level. CHADS-VASc: 4. Pharmacy consulted for initiate and manage heparin infusion.   Baseline Labs: aPTT 27, HL <0.10, PT 15.1, INR 1.2, Hgb 14.3, Hct 43.5, Plt 228   Goal of Therapy:  Heparin level 0.3-0.7 units/ml Monitor platelets by anticoagulation protocol: Yes   Date Time HL Rate/Comment  8/9 1153 0.44 1350/therapeutic x1 8/9 1808 0.52 1350/therapeutic x2 8/10 0539 0.42 1350/therapeutic  8/11 0422 0.40 1350/therapeutic  8/12 0440 0.39 1350/therapeutic  8/12     2259    0.16    1350/SUBtherapeutic  8/13     0630    0.70  1700/Therapeutic X 1  8/13 1211 0.75 1700/SUPRAtherapeutic 8/13 2009 0.67  8/14     0146    0.67     1550/therapeutic X 2  8/15     0401    0.84  1550/SUPRAtherapeutic   Plan:  8/15:  HL @ 0401 = 0.84, SUPRAtherapeutic  - Will decrease heparin drip rate to 1400 units/hr and recheck HL 6 hrs after rate change   , D, PharmD Clinical Pharmacist 10/30/2022 5:02 AM

## 2022-10-31 ENCOUNTER — Other Ambulatory Visit: Payer: Self-pay | Admitting: *Deleted

## 2022-10-31 DIAGNOSIS — I509 Heart failure, unspecified: Secondary | ICD-10-CM | POA: Diagnosis not present

## 2022-10-31 DIAGNOSIS — I4891 Unspecified atrial fibrillation: Secondary | ICD-10-CM | POA: Diagnosis not present

## 2022-10-31 DIAGNOSIS — R0609 Other forms of dyspnea: Secondary | ICD-10-CM | POA: Diagnosis not present

## 2022-10-31 LAB — BASIC METABOLIC PANEL
Anion gap: 8 (ref 5–15)
BUN: 17 mg/dL (ref 6–20)
CO2: 28 mmol/L (ref 22–32)
Calcium: 9 mg/dL (ref 8.9–10.3)
Chloride: 103 mmol/L (ref 98–111)
Creatinine, Ser: 1.03 mg/dL (ref 0.61–1.24)
GFR, Estimated: >60 mL/min (ref >60)
Glucose, Bld: 95 mg/dL (ref 70–99)
Potassium: 4.4 mmol/L (ref 3.5–5.1)
Sodium: 139 mmol/L (ref 135–145)

## 2022-10-31 LAB — DIGOXIN LEVEL: Digoxin Level: 0.5 ng/mL — ABNORMAL LOW (ref 0.8–2.0)

## 2022-10-31 LAB — MAGNESIUM: Magnesium: 2.5 mg/dL — ABNORMAL HIGH (ref 1.7–2.4)

## 2022-10-31 LAB — HEPARIN LEVEL (UNFRACTIONATED): Heparin Unfractionated: 0.42 [IU]/mL (ref 0.30–0.70)

## 2022-10-31 MED ORDER — APIXABAN 5 MG PO TABS: 5.0000 mg | ORAL_TABLET | Freq: Two times a day (BID) | ORAL | 0 refills | Status: DC

## 2022-10-31 MED ORDER — EMPAGLIFLOZIN 10 MG PO TABS: 10.0000 mg | ORAL_TABLET | Freq: Every day | ORAL | Status: DC

## 2022-10-31 MED ORDER — FUROSEMIDE 20 MG PO TABS: 20.0000 mg | ORAL_TABLET | Freq: Every day | ORAL | 0 refills | Status: DC

## 2022-10-31 MED ORDER — SPIRONOLACTONE 25 MG PO TABS: 12.5000 mg | ORAL_TABLET | Freq: Every day | ORAL | 0 refills | Status: DC

## 2022-10-31 MED ORDER — APIXABAN 5 MG PO TABS
5.0000 mg | ORAL_TABLET | Freq: Two times a day (BID) | ORAL | Status: DC
  Administered 2022-10-31: 5 mg via ORAL
  Filled 2022-10-31: qty 1

## 2022-10-31 MED ORDER — LOSARTAN POTASSIUM 25 MG PO TABS: 12.5000 mg | ORAL_TABLET | Freq: Every day | ORAL | 0 refills | Status: DC

## 2022-10-31 MED ORDER — COLCHICINE 0.6 MG PO TABS: 0.6000 mg | ORAL_TABLET | Freq: Every day | ORAL | 0 refills | Status: AC | PRN

## 2022-10-31 MED ORDER — EMPAGLIFLOZIN 10 MG PO TABS: 10.0000 mg | ORAL_TABLET | Freq: Every day | ORAL | 0 refills | Status: DC

## 2022-10-31 MED ORDER — DIGOXIN 125 MCG PO TABS: 0.1250 mg | ORAL_TABLET | Freq: Every day | ORAL | 0 refills | Status: DC

## 2022-10-31 NOTE — Consult Note (Signed)
ANTICOAGULATION CONSULT NOTE  Pharmacy Consult for Heparin Infusion Indication: atrial fibrillation  No Known Allergies  Patient Measurements: Height: 5\' 10"  (177.8 cm) Weight: 88.3 kg (194 lb 10.7 oz) IBW/kg (Calculated) : 73 Heparin Dosing Weight: 88.5 kg  Vital Signs: Temp: 97.7 F (36.5 C) (08/16 0258) Temp Source: Oral (08/16 0258) BP: 102/80 (08/16 0258) Pulse Rate: 99 (08/16 0258)  Labs: Recent Labs    10/28/22 0531 10/28/22 0630 10/29/22 0146 10/30/22 0401 10/30/22 1141 10/30/22 2003 10/31/22 0251  HGB 14.6  --  14.6 14.3  --   --   --   HCT 45.8  --  44.3 44.6  --   --   --   PLT 186  --  177 178  --   --   --   HEPARINUNFRC  --    < > 0.67 0.84* 0.92* 0.37 0.42  CREATININE 1.10  --  1.14 0.91  --   --  1.03   < > = values in this interval not displayed.    Estimated Creatinine Clearance: 85.3 mL/min (by C-G formula based on SCr of 1.03 mg/dL).   Medical History: Past Medical History:  Diagnosis Date   Alcohol abuse    Chronic pain    COVID-19    a. 02/2019   HFrEF (heart failure with reduced ejection fraction) (HCC)    a. 05/2019 Echo: EF 35-40%; b. 07/2021 Echo: EF 40-45%; c. 10/2022 Echo: EF 20-25%, glob HK, mildly reduced RV fxn, sev dil LA, mid-mod MR, mild AI.   History of medication noncompliance    Longstanding persistent atrial fibrillation (HCC)    a. Dx 03/2018; b. 05/2019 recurrent Afib in setting of PE. CHA2DS2VASc = 1-->Eliquis later changed to xarelto; b. 07/2019 s/p DCCV (150J); d. 08/2019 admit w/ AF RVR in settting of noncompliance - longstanding persistent afib since on bb/ccb rx.   NICM (nonischemic cardiomyopathy) (HCC)    a. 05/2019 Echo: EF 35-40%; b. 05/2019 MV: EF 32%, no ischemia; c. 07/2021 Echo: EF 40-45%; d. 10/2022 Echo: EF 20-25%, glob HK.   Pulmonary embolism (HCC)    a. 05/2019 CTA Chest: mild amt of PE w/in a lower lobe branch of RPA; b. 08/2019 CTA Chest: Small PE in RML and RLL PA.    Medications:  Patient hasn't been taking  Rivaroxaban since January due to insurance reasons  Also not on any rate control medications for same reason (Digoxin and Metoprolol)  Assessment: Calvin Esparza is a 62 y.o. male with a history of afib was on rivaroxaban PTA with RVR and PE not taking any of his medications since January of 2024 due to loss of insurance. Presented to the ED with a CHF exacerbation, dyspnea, peripheral edema and weight gain. Baseline heparin level was < 0.10, so we will monitor heparin using heparin level. CHADS-VASc: 4. Pharmacy consulted for initiate and manage heparin infusion.   Baseline Labs: aPTT 27, HL <0.10, PT 15.1, INR 1.2, Hgb 14.3, Hct 43.5, Plt 228   Goal of Therapy:  Heparin level 0.3-0.7 units/ml Monitor platelets by anticoagulation protocol: Yes   Date Time HL Rate/Comment  8/9 1153 0.44 1350/therapeutic x1 8/9 1808 0.52 1350/therapeutic x2 8/10 0539 0.42 1350/therapeutic  8/11 0422 0.40 1350/therapeutic  8/12 0440 0.39 1350/therapeutic  8/12     2259    0.16    1350/SUBtherapeutic  8/13     0630    0.70  1700/therapeutic x1  8/13 1211 0.75 1700/SUPRAtherapeutic 8/13 2009 0.67  8/14  0146    0.67     1550/therapeutic x2  8/15     0401    0.84     1550/SUPRAtherapeutic  8/15 1141 0.92 1400/SUPRAtherapeutic 8/15     2008 0.37 1200/therapeutic x1 8/16     0251    0.42    1200/therapueitc X 2   Plan:  - Will continue heparin drip rate at 1200 units/hr - Recheck HL on 8/17 with AM labs.  - CBC daily   Doak Mah D, PharmD Clinical Pharmacist 10/31/2022 4:05 AM

## 2022-10-31 NOTE — Discharge Summary (Signed)
Physician Discharge Summary  Patient: Calvin Esparza:[id number] DOB: 1961/01/05   Code Status: Full Code Admit date: 10/24/2022 Discharge date: 10/31/2022 Disposition: Home, No home health services recommended PCP: Lauro Regulus, MD  Recommendations for Outpatient Follow-up:  Follow up with PCP within 1-2 weeks Regarding general hospital follow up and preventative care Recommend  Follow up with cardiology   Discharge Diagnoses:  Principal Problem:   CHF (congestive heart failure) (HCC) Active Problems:   Atrial fibrillation with RVR (HCC)   Acute on chronic combined systolic (congestive) and diastolic (congestive) heart failure (HCC)   Permanent atrial fibrillation (HCC)   Exertional dyspnea   Nonadherence to medication   Nonspecific chest pain  Brief Hospital Course Summary: Calvin Esparza is a 62 y.o. male with medical history significant of chronic HFrEF with LVEF 30-40%, HTN, HLD, gout, PE and PAF on Xarelto, alcohol dependence.  They presented with worsening of fatigue, exertional dyspnea, and increasing leg swelling. Patient stopped taking all his medications since January this year due to insurance coverage issue.   In the ED: chest x-ray showed cardiomegaly and pulmonary congestion, CT angiogram negative for PE. Creatinine 1.2, K3.8, WBC 7.2, hemoglobin 14.3. HR 170's. Picture consistent with CHF exacerbation and PAF.   Cardiology was consulted. He received diuresis and metoprolol. Echo showed EF 20-25%, global hypokinesis, indeterminate diastolic parameters, RV fxn reduced, mild pulm HTN, RA and LA dilation, mild/mod MR. L and R Cardiac cath 8/12 without significant obstructive disease. Started on milrinone gtt due to hypotension after procedure and was gradually able to wean from this over the next few days.  His HF medications were modified by cardiology throughout his stay to optimize his therapy and he tolerated well. He was asymptomatic and euvolemic on  day of discharge.   Discharge Condition: Good, improved Recommended discharge diet: Cardiac diet  Consultations: Cardiology  PCCM  Procedures/Studies: LHC, RHC  Discharge Instructions     Discharge patient   Complete by: As directed    Discharge disposition: 01-Home or Self Care   Discharge patient date: 10/31/2022      Allergies as of 10/31/2022   No Known Allergies      Medication List     STOP taking these medications    BC FAST PAIN RELIEF PO   diltiazem 180 MG 24 hr capsule Commonly known as: CARDIZEM CD   Farxiga 10 MG Tabs tablet Generic drug: dapagliflozin propanediol   metoprolol 200 MG 24 hr tablet Commonly known as: TOPROL-XL   potassium chloride SA 20 MEQ tablet Commonly known as: Klor-Con M20   torsemide 20 MG tablet Commonly known as: DEMADEX   Xarelto 20 MG Tabs tablet Generic drug: rivaroxaban       TAKE these medications    apixaban 5 MG Tabs tablet Commonly known as: ELIQUIS Take 1 tablet (5 mg total) by mouth 2 (two) times daily.   colchicine 0.6 MG tablet Take 1 tablet (0.6 mg total) by mouth daily as needed. What changed:  when to take this reasons to take this   digoxin 0.125 MG tablet Commonly known as: LANOXIN Take 1 tablet (0.125 mg total) by mouth daily. Start taking on: November 01, 2022 What changed:  medication strength See the new instructions.   empagliflozin 10 MG Tabs tablet Commonly known as: JARDIANCE Take 1 tablet (10 mg total) by mouth daily. Start taking on: November 01, 2022   furosemide 20 MG tablet Commonly known as: LASIX Take 1 tablet (20 mg total)  by mouth daily. Start taking on: November 01, 2022   losartan 25 MG tablet Commonly known as: COZAAR Take 0.5 tablets (12.5 mg total) by mouth daily. Start taking on: November 01, 2022   spironolactone 25 MG tablet Commonly known as: ALDACTONE Take 0.5 tablets (12.5 mg total) by mouth daily. Start taking on: November 01, 2022        Follow-up  Information     Gollan, Tollie Pizza, MD. Go to.   Specialty: Cardiology Contact information: [address] [place] [address] [phone]                Subjective   Pt reports feeling well today. Denies CP, SOB, LE swelling.  He denies orthostatic symptoms when getting up to move around his room. Endorses that he has the ability to get his meds from pharmacy and attend cardiology follow up. Plans to take his medications as directed.   All questions and concerns were addressed at time of discharge.  Objective  Blood pressure 103/82, pulse 100, temperature 97.6 F (36.4 C), resp. rate 18, height 5\' 10"  (1.778 m), weight 84.8 kg, SpO2 98%.   General: Pt is alert, awake, not in acute distress Cardiovascular: RRR, S1/S2 +, no rubs, no gallops Respiratory: CTA bilaterally, no wheezing, no rhonchi Abdominal: Soft, NT, ND, bowel sounds + Extremities: no edema, no cyanosis  The results of significant diagnostics from this hospitalization (including imaging, microbiology, ancillary and laboratory) are listed below for reference.   Imaging studies: DG Abd 1 View  Result Date: 10/30/2022 CLINICAL DATA:  Abdominal pain with nausea and vomiting, initial encounter EXAM: ABDOMEN - 1 VIEW COMPARISON:  None Available. FINDINGS: Scattered large and small bowel gas is noted. No obstructive changes are seen. Mild scoliosis concave to the left is noted in the lumbar spine. IMPRESSION: No acute abnormality noted. Electronically Signed   By: Alcide Clever M.D.   On: 10/30/2022 02:25   DG Chest Port 1 View  Result Date: 10/27/2022 CLINICAL DATA:  PICC line placement EXAM: PORTABLE CHEST 1 VIEW COMPARISON:  10/25/2018 FINDINGS: Left PICC line tip: Lower SVC. No pneumothorax or complicating feature. Moderate enlargement of the cardiopericardial silhouette. The lungs appear clear. No blunting of the costophrenic angles. Thoracic spondylosis. IMPRESSION: 1. Left PICC line tip: Lower SVC.  2. Moderate enlargement of the cardiopericardial silhouette. Electronically Signed   By: Gaylyn Rong M.D.   On: 10/27/2022 15:32   [place] EKG Site Rite  Result Date: 10/27/2022 If Site Rite image not attached, placement could not be confirmed due to current cardiac rhythm.  CARDIAC CATHETERIZATION  Result Date: 10/27/2022 Conclusions: No angiographically significant coronary artery disease. Upper normal left and right heart filling pressures. Severely reduced Fick cardiac output/index.  Low PAPi suggests a component of right heart failure as well. Recommendations: Transfer to stepdown and initiate milrinone therapy. Decrease metoprolol succinate in the setting of low-output heart failure. Maintain net even fluid balance. If cardiac output cannot be improved and/or milrinone cannot be weaned off, will need to consider transfer to [facility] for further management by the advanced heart failure team. Yvonne Kendall, MD [facility]  DG Chest 1 View  Result Date: 10/25/2022 CLINICAL DATA:  [address] with CHF. EXAM: CHEST  1 VIEW COMPARISON:  CTA chest yesterday at 6:15 a.m., portable chest yesterday at 5:08 a.m. FINDINGS: 7:01 a.m. Moderate cardiomegaly. There continues to be mild central vascular prominence but no overt edema. The lungs are clear of infiltrates. There is no  substantial pleural effusion. The mediastinum is stable with lipomatosis and mild aortic tortuosity and atherosclerosis. No new osseous abnormality. Thoracic spondylosis. Overlying monitor wires. IMPRESSION: Cardiomegaly with mild central vascular prominence but no overt edema or evidence of pneumonia. No significant change from yesterday's study. Electronically Signed   By: Almira Bar M.D.   On: 10/25/2022 07:46   ECHOCARDIOGRAM COMPLETE  Result Date: 10/24/2022    ECHOCARDIOGRAM REPORT   Patient Name:   Calvin Esparza Date of Exam: 10/24/2022 Medical Rec #:  [id number]        Height:       70.0 in Accession #:    [id number]        Weight:       195.0 lb Date of Birth:  [birth year]       BSA:          2.065 m Patient Age:    61 years         BP:           118/106 mmHg Patient Gender: M                HR:           107 bpm. Exam Location:  [facility] Procedure: 2D Echo, Cardiac Doppler, Color Doppler and Intracardiac            Opacification Agent Indications:     CHF  History:         Patient has prior history of Echocardiogram examinations, most                  recent 07/18/2021. CHF and Cardiomyopathy, Arrythmias:Atrial                  Fibrillation; Risk Factors:Hypertension and Dyslipidemia. ETOH                  abuse.  Sonographer:     Mikki Harbor Referring Phys:  [id number] Emeline General Diagnosing Phys: Debbe Odea MD IMPRESSIONS  1. Left ventricular ejection fraction, by estimation, is 20 to 25%. Left ventricular ejection fraction by PLAX is 23 %. The left ventricle has severely decreased function. The left ventricle demonstrates global hypokinesis. Left ventricular diastolic parameters are indeterminate.  2. Right ventricular systolic function is mildly reduced. The right ventricular size is mildly enlarged. There is mildly elevated pulmonary artery systolic pressure.  3. Left atrial size was severely dilated.  4. Right atrial size was mildly dilated.  5. The mitral valve is normal in structure. Mild to moderate mitral valve regurgitation.  6. The aortic valve is tricuspid. Aortic valve regurgitation is mild.  7. The inferior vena cava is dilated in size with >50% respiratory variability, suggesting right atrial pressure of 8 mmHg. FINDINGS  Left Ventricle: Left ventricular ejection fraction, by estimation, is 20 to 25%. Left ventricular ejection fraction by PLAX is 23 %. The left ventricle has severely decreased function. The left ventricle demonstrates global hypokinesis. Definity contrast agent was given IV to delineate the left ventricular endocardial borders. The left ventricular internal cavity size was normal in size. There  is no left ventricular hypertrophy. Left ventricular diastolic parameters are indeterminate. Right Ventricle: The right ventricular size is mildly enlarged. No increase in right ventricular wall thickness. Right ventricular systolic function is mildly reduced. There is mildly elevated pulmonary artery systolic pressure. The tricuspid regurgitant  velocity is 2.68 m/s, and with an assumed right atrial pressure of 15 mmHg, the estimated right ventricular systolic pressure  is 43.7 mmHg. Left Atrium: Left atrial size was severely dilated. Right Atrium: Right atrial size was mildly dilated. Pericardium: There is no evidence of pericardial effusion. Mitral Valve: The mitral valve is normal in structure. Mild to moderate mitral valve regurgitation. MV peak gradient, 3.0 mmHg. The mean mitral valve gradient is 1.0 mmHg. Tricuspid Valve: The tricuspid valve is normal in structure. Tricuspid valve regurgitation is mild. Aortic Valve: The aortic valve is tricuspid. Aortic valve regurgitation is mild. Aortic valve mean gradient measures 2.0 mmHg. Aortic valve peak gradient measures 3.2 mmHg. Aortic valve area, by VTI measures 1.98 cm. Pulmonic Valve: The pulmonic valve was not well visualized. Pulmonic valve regurgitation is not visualized. Aorta: The aortic root and ascending aorta are structurally normal, with no evidence of dilitation. Venous: The inferior vena cava is dilated in size with greater than 50% respiratory variability, suggesting right atrial pressure of 8 mmHg. IAS/Shunts: No atrial level shunt detected by color flow Doppler.  LEFT VENTRICLE PLAX 2D LV EF:         Left ventricular ejection fraction by PLAX is 23 %. LVIDd:         5.70 cm LVIDs:         5.10 cm LV PW:         0.80 cm LV IVS:        1.10 cm LVOT diam:     2.00 cm LV SV:         31 LV SV Index:   15 LVOT Area:     3.14 cm  RIGHT VENTRICLE RV Basal diam:  4.70 cm RV Mid diam:    3.40 cm RV S prime:     12.30 cm/s LEFT ATRIUM              Index         RIGHT ATRIUM           Index LA diam:        5.10 cm  2.47 cm/m   RA Area:     24.00 cm LA Vol (A2C):   116.0 ml 56.17 ml/m  RA Volume:   78.60 ml  38.06 ml/m LA Vol (A4C):   149.0 ml 72.16 ml/m LA Biplane Vol: 141.0 ml 68.28 ml/m  AORTIC VALVE                    PULMONIC VALVE AV Area (Vmax):    2.14 cm     PV Vmax:       0.68 m/s AV Area (Vmean):   1.93 cm     PV Peak grad:  1.8 mmHg AV Area (VTI):     1.98 cm AV Vmax:           90.10 cm/s AV Vmean:          61.100 cm/s AV VTI:            0.159 m AV Peak Grad:      3.2 mmHg AV Mean Grad:      2.0 mmHg LVOT Vmax:         61.25 cm/s LVOT Vmean:        37.450 cm/s LVOT VTI:          0.100 m LVOT/AV VTI ratio: 0.63  AORTA Ao Root diam: 3.40 cm Ao Asc diam:  3.30 cm MITRAL VALVE               TRICUSPID VALVE MV Area (PHT): 4.68 cm  TR Peak grad:   28.7 mmHg MV Area VTI:   2.08 cm    TR Vmax:        268.00 cm/s MV Peak grad:  3.0 mmHg MV Mean grad:  1.0 mmHg    SHUNTS MV Vmax:       0.86 m/s    Systemic VTI:  0.10 m MV Vmean:      50.3 cm/s   Systemic Diam: 2.00 cm MV Decel Time: 162 msec MV E velocity: 57.10 cm/s Debbe Odea MD Electronically signed by Debbe Odea MD Signature Date/Time: 10/24/2022/4:08:29 PM    Final    CT Angio Chest PE W/Cm &/Or Wo Cm  Result Date: 10/24/2022 CLINICAL DATA:  62 year old male with history of mid chest pain and shortness of breath. EXAM: CT ANGIOGRAPHY CHEST WITH CONTRAST TECHNIQUE: Multidetector CT imaging of the chest was performed using the standard protocol during bolus administration of intravenous contrast. Multiplanar CT image reconstructions and MIPs were obtained to evaluate the vascular anatomy. RADIATION DOSE REDUCTION: This exam was performed according to the departmental dose-optimization program which includes automated exposure control, adjustment of the mA and/or kV according to patient size and/or use of iterative reconstruction technique. CONTRAST:  75mL OMNIPAQUE IOHEXOL 350 MG/ML SOLN  COMPARISON:  Chest CTA 09/08/2019. FINDINGS: Cardiovascular: No filling defects are noted within the pulmonary arterial tree to suggest pulmonary embolism. Heart size is enlarged with left atrial dilatation. There is no significant pericardial fluid, thickening or pericardial calcification. No atherosclerotic calcifications are noted in the thoracic aorta or the coronary arteries. Mediastinum/Nodes: Multiple prominent borderline enlarged mediastinal and bilateral hilar lymph nodes are noted, nonspecific. Mildly enlarged right paratracheal lymph node (axial image 41 of series 4) currently measuring 1.6 cm in short axis (previously 1.3 cm on 09/08/2019). Esophagus is unremarkable in appearance. No axillary lymphadenopathy. Lungs/Pleura: No acute consolidative airspace disease. No pleural effusions. No suspicious appearing pulmonary nodules or masses are noted. Upper Abdomen: Aortic atherosclerosis. Musculoskeletal: There are no aggressive appearing lytic or blastic lesions noted in the visualized portions of the skeleton. Review of the MIP images confirms the above findings. IMPRESSION: 1. No acute findings are noted in the thorax to account for the patient's symptoms. Specifically, no evidence of pulmonary embolism. 2. Cardiomegaly with left atrial dilatation. 3. Mildly enlarged right paratracheal lymph node, only minimally increased in size compared to prior examination from 09/08/2019. This is nonspecific and favored to be benign. Follow-up contrast-enhanced chest CT is recommended in 6 months to ensure the stability or regression of this finding. Electronically Signed   By: Trudie Reed M.D.   On: 10/24/2022 06:29   DG Chest Port 1 View  Result Date: 10/24/2022 CLINICAL DATA:  62 year old male with history of chest pain. EXAM: PORTABLE CHEST 1 VIEW COMPARISON:  Chest x-ray 07/17/2021. FINDINGS: Lung volumes are normal. No consolidative airspace disease. No pleural effusions. No pneumothorax. No evidence of  pulmonary edema. Heart size is mildly enlarged. The patient is rotated to the right on today's exam, resulting in distortion of the mediastinal contours and reduced diagnostic sensitivity and specificity for mediastinal pathology. IMPRESSION: 1. No radiographic evidence of acute cardiopulmonary disease. 2. Mild cardiomegaly. Electronically Signed   By: Trudie Reed M.D.   On: 10/24/2022 05:30    Labs: Basic Metabolic Panel: Recent Labs  Lab 10/27/22 0440 10/27/22 1129 10/27/22 1132 10/28/22 0531 10/29/22 0146 10/30/22 0401 10/31/22 0251  NA 135   < > 138 136 136 137 139  K 3.6   < >  3.9 3.5 3.8 4.2 4.4  CL 99  --   --  100 102 102 103  CO2 27  --   --  29 27 25 28   GLUCOSE 101*  --   --  127* 146* 116* 95  BUN 21*  --   --  20 21* 18 17  CREATININE 1.08  --   --  1.10 1.14 0.91 1.03  CALCIUM 9.0  --   --  8.8* 8.7* 9.0 9.0  MG  --   --   --  2.6* 2.4 2.6* 2.5*   < > = values in this interval not displayed.   CBC: Recent Labs  Lab 10/25/22 0539 10/27/22 0440 10/27/22 1129 10/27/22 1132 10/28/22 0531 10/29/22 0146 10/30/22 0401  WBC 5.1 6.1  --   --  5.5 6.7 8.8  HGB 13.5 14.9 16.3 16.7 14.6 14.6 14.3  HCT 40.8 44.5 48.0 49.0 45.8 44.3 44.6  MCV 86.4 85.6  --   --  89.8 87.5 90.1  PLT 179 192  --   --  186 177 178   Microbiology: Results for orders placed or performed during the hospital encounter of 10/24/22  MRSA Next Gen by PCR, Nasal     Status: None   Collection Time: 10/27/22  3:34 PM   Specimen: Nasal Mucosa; Nasal Swab  Result Value Ref Range Status   MRSA by PCR Next Gen NOT DETECTED NOT DETECTED Final    Comment: (NOTE) The GeneXpert MRSA Assay (FDA approved for NASAL specimens only), is one component of a comprehensive MRSA colonization surveillance program. It is not intended to diagnose MRSA infection nor to guide or monitor treatment for MRSA infections. Test performance is not FDA approved in patients less than 30 years old. Performed at [facility], [address], [place], [place] [address]    Time coordinating discharge: Over 30 minutes  Leeroy Bock, MD  [facility] 10/31/2022, 1:53 PM

## 2022-10-31 NOTE — Progress Notes (Incomplete)
PROGRESS NOTE Calvin Esparza   NWG:[id number] DOB: [birth year]  DOA: 10/24/2022 Date of Service: 10/31/22 PCP: Lauro Regulus, MD  Brief Narrative / Hospital Course:  Calvin Esparza is a 62 y.o. male with medical history significant of chronic HFrEF with LVEF 30-40%, HTN, HLD, gout, PE and PAF on Xarelto, alcohol abuse, presented with worsening of fatigue, exertional dyspnea, and increasing leg swelling. Patient stopped taking all his medications since January this year due to insurance coverage issue. Started about 2 months ago, patient started to feel fatigued, exertional dyspnea and gradually getting worse.  08/09: to ED. chest x-ray showed cardiomegaly and pulmonary congestion, CT angiogram negative for PE. Creatinine 1.2, K3.8, WBC 7.2, hemoglobin 14.3. HR 170's. Tx w/ po metoprolol and IV lasix. Admitted for CHF, Afib.  08/10: HR remains elevated 110s. BP improved. Net IO Since Admission: -3L. Cr 1.04. Echo reviewed - EF 20-25%, global hypokinesis, indeterminate diastolic parameters, RV fxn reduced, mild pulm HTN, RA and LA dilation, mild/mod MR (compared to Echo 07/2021 w/ EF 40-45%). Cardio consult - plan for cardiac cath.  08/11: pending cath, continue diuresis, Net IO Since Admission: -6L 08/12: Net IO Since Admission: -7.5L. Cardiac cath showing significantly reduced cardiac output. Transferring to SDU for milrinone gtt   08/13: stable on milrinone.  8/14: discontinued milrinone and remains stable but borderline low BP. Has not been out of bed today yet. Event late at night with abdominal pain related to constipation worsened by bed-ridden and opioid medications 8/15: remains stable cardiac status with medication adjustments being made for soft blood pressures. Increased bowel regimen. Transfer out of step down   Consultants:  Cardiology    Procedures: 10/27/22 cardiac cath     ASSESSMENT & PLAN:   Acute on chronic HFrEF decompensation  Pulmonary HTN  Afib RVR POA- HR  well controlled at rest. Digoxin level 0.5 today Cardiology following, appreciate your care Dc'd milrinone Continue to monitor Cox Continue metoprolol, digoxin, losartan Continue diuresis- volume status greatly improved.  decrease spironolactone Continue heparin gtt. Eventually transition to eliquis when no procedure verified Strict I&O, fluid restriction diet  Farxiga  Constipation-  - continue miralax, senna, lactulose - PRN suppository and enema ordered - consider KUB if not improving    History of cocaine use UDS ordered, pending    Alcohol abuse No symptoms or signs of active withdrawal CIWA protocol with as needed benzo Thiamine, Folic acid    History of PE CTA Chest negative this admission for PE Anticoag as above see Afib     DVT prophylaxis: heparin per cardiology --> likely to change to oral today. No planned procedures Central lines / invasive devices: none Code Status: FULL CODE ACP documentation reviewed   Current Admission Status: inpatient   TOC needs / Dispo plan: TBD Barriers to discharge / significant pending items: further medical stabilization  Subjective / Brief ROS:  Patient reports doing well. Had severe abdominal pain and endorses several days since he had a good BM. Had a small one yesterday. Denies any past abdominal surgeries.   Family Communication: none at bedside  Objective Findings: Blood pressure 96/61, pulse 90, temperature 98.1 F (36.7 C), resp. rate 19, height 5\' 10"  (1.778 m), weight 88.7 kg, SpO2 92%.  Examination:  Physical Exam Vitals reviewed.  Constitutional:      General: He is not in acute distress.    Appearance: He is well-developed.  Cardiovascular:     Rate and Rhythm: Regular rhythm. Tachycardia present.  Heart sounds: Murmur heard.     Systolic murmur is present.  Pulmonary:     Effort: Pulmonary effort is normal. No respiratory distress.     Breath sounds: Normal breath sounds.  Abdominal:     General:  Bowel sounds are normal.     Palpations: Abdomen is soft.     Tenderness: There is no abdominal tenderness.  Skin:    General: Skin is warm and dry.  Neurological:     General: No focal deficit present.     Mental Status: He is alert and oriented to person, place, and time.  Psychiatric:        Mood and Affect: Mood normal.        Behavior: Behavior normal.    Data Reviewed:  I have personally reviewed the following...  CBC: Recent Labs  Lab 10/25/22 0539 10/27/22 0440 10/27/22 1129 10/27/22 1132 10/28/22 0531 10/29/22 0146 10/30/22 0401  WBC 5.1 6.1  --   --  5.5 6.7 8.8  HGB 13.5 14.9 16.3 16.7 14.6 14.6 14.3  HCT 40.8 44.5 48.0 49.0 45.8 44.3 44.6  MCV 86.4 85.6  --   --  89.8 87.5 90.1  PLT 179 192  --   --  186 177 178   Basic Metabolic Panel: Recent Labs  Lab 10/27/22 0440 10/27/22 1129 10/27/22 1132 10/28/22 0531 10/29/22 0146 10/30/22 0401 10/31/22 0251  NA 135   < > 138 136 136 137 139  K 3.6   < > 3.9 3.5 3.8 4.2 4.4  CL 99  --   --  100 102 102 103  CO2 27  --   --  29 27 25 28   GLUCOSE 101*  --   --  127* 146* 116* 95  BUN 21*  --   --  20 21* 18 17  CREATININE 1.08  --   --  1.10 1.14 0.91 1.03  CALCIUM 9.0  --   --  8.8* 8.7* 9.0 9.0  MG  --   --   --  2.6* 2.4 2.6* 2.5*   < > = values in this interval not displayed.   Radiology Studies last 3 days: DG Abd 1 View  Result Date: 10/30/2022 CLINICAL DATA:  Abdominal pain with nausea and vomiting, initial encounter EXAM: ABDOMEN - 1 VIEW COMPARISON:  None Available. FINDINGS: Scattered large and small bowel gas is noted. No obstructive changes are seen. Mild scoliosis concave to the left is noted in the lumbar spine. IMPRESSION: No acute abnormality noted. Electronically Signed   By: Alcide Clever M.D.   On: 10/30/2022 02:25   DG Chest Port 1 View  Result Date: 10/27/2022 CLINICAL DATA:  PICC line placement EXAM: PORTABLE CHEST 1 VIEW COMPARISON:  10/25/2018 FINDINGS: Left PICC line tip: Lower SVC.  No pneumothorax or complicating feature. Moderate enlargement of the cardiopericardial silhouette. The lungs appear clear. No blunting of the costophrenic angles. Thoracic spondylosis. IMPRESSION: 1. Left PICC line tip: Lower SVC. 2. Moderate enlargement of the cardiopericardial silhouette. Electronically Signed   By: Gaylyn Rong M.D.   On: 10/27/2022 15:32   [place] EKG Site Rite  Result Date: 10/27/2022 If Site Rite image not attached, placement could not be confirmed due to current cardiac rhythm.  CARDIAC CATHETERIZATION  Result Date: 10/27/2022 Conclusions: No angiographically significant coronary artery disease. Upper normal left and right heart filling pressures. Severely reduced Fick cardiac output/index.  Low PAPi suggests a component of right heart failure as well. Recommendations: Transfer to  stepdown and initiate milrinone therapy. Decrease metoprolol succinate in the setting of low-output heart failure. Maintain net even fluid balance. If cardiac output cannot be improved and/or milrinone cannot be weaned off, will need to consider transfer to [facility] for further management by the advanced heart failure team. Yvonne Kendall, MD [facility]     LOS: 7 days   .  Leeroy Bock, DO [facility] 10/31/2022, 7:50 AM    If 7PM-7AM, please contact night coverage [email or web address]

## 2022-10-31 NOTE — Plan of Care (Signed)
Care plan complete

## 2022-10-31 NOTE — Consult Note (Signed)
[facility] Customer service manager [facility]) Accountable Care Organization (ACO) [facility] Liaison Note  10/31/2022  TOBENNA COLLE 1960/06/06 [id number]  Location: [facility] Liaison screened the patient remotely at [facility].  Insurance: [employer] Advant[employer]   KAYLOR BORT is a 62 y.o. male who is a Primary Care Patient of Lauro Regulus, MD. The patient was screened for  readmission hospitalization with noted low risk score for unplanned readmission risk with 1 IP in 6 months.  The patient was assessed for potential [facility]) Care Management service needs for post hospital transition for care coordination. Review of patient's electronic medical record reveals patient was admitted for Atrial Fibrillation/CHF. Liaison attempted outreach call to bedside room and mobile device however unsuccessful. Based upon the admitting diagnosis will make a referral for care coordinator to complete a post hospital prevention readmission.  Plan: [facility] Liaison will continue to follow progress and disposition to asess for post hospital community care coordination/management needs.  Referral request for community care coordination upon pt's discharge to his home residence.   [facility] Care Management/Population Health does not replace or interfere with any arrangements made by the Inpatient Transition of Care team.   For questions contact:   Elliot Cousin, RN, [facility] Liaison [place]   Population Health Office Hours MTWF  8:00 am-6:00 pm Off on Thursday [phone] mobile [phone] [Office toll free line] Office Hours are M-F 8:30 - 5 pm [email or web address]

## 2022-10-31 NOTE — Progress Notes (Addendum)
[facility] HF Stewardship  PCP: Lauro Regulus, MD  PCP-Cardiologist: Julien Nordmann, MD  HPI: Calvin Esparza is a 62 y.o. male with CHF, HTN, HLD, gout, PE and PAF on Xarelto, alcohol abuse, presented with worsening of fatigue, exertional dyspnea, and increasing leg swelling. Echo in 2021 showed LVEF of 35-40%. LVEF increased to 40-45% in 07/2021. New echo pending. Patient has been off HF medications since January due to cost and experienced progression of symptoms starting in June. BNP 910 on admission. Diuresed with IV Lasix. Transitioned to PO diuretic after mild creatine bump and symptom improvement. LHC on 10/27/22 showed no obstructive CAD. RHC 10/27/22 showed RA 8, PA 23/16 (18), PCWP 16, CO 2.7, CI 1.4, PAPi 0.9. Given low output heart failure, milrinone started 8/12 and transferred to ICU. Milrinone weaned off on 8/14. COOX off milrinone decreased to 58%. Pt now ambulating halls without symptoms.  Pertinent Lab Values: BUN  Date Value Ref Range Status  10/31/2022 17 6 - 20 mg/dL Final  34/74/2595 22 6 - 24 mg/dL Final   Potassium  Date Value Ref Range Status  10/31/2022 4.4 3.5 - 5.1 mmol/L Final   Sodium  Date Value Ref Range Status  10/31/2022 139 135 - 145 mmol/L Final  12/03/2020 140 134 - 144 mmol/L Final   B Natriuretic Peptide  Date Value Ref Range Status  10/24/2022 910.5 (H) 0.0 - 100.0 pg/mL Final    Comment:    Performed at [facility], [address] [place], [place] [address]   Magnesium  Date Value Ref Range Status  10/31/2022 2.5 (H) 1.7 - 2.4 mg/dL Final    Comment:    Performed at [facility], [address] [place], [place] [address]   TSH  Date Value Ref Range Status  02/25/2020 1.449 0.350 - 4.500 uIU/mL Final    Comment:    Performed by a 3rd Generation assay with a functional sensitivity of <=0.01 uIU/mL. Performed at [facility], [address] [place], [place] [address]     Vital Signs: Temp:  [97.6 F  (36.4 C)-98.8 F (37.1 C)] 97.6 F (36.4 C) (08/16 0727) Pulse Rate:  [92-116] 100 (08/16 0727) Cardiac Rhythm: Atrial fibrillation (08/16 0743) Resp:  [15-20] 18 (08/16 0727) BP: (83-117)/(64-87) 103/82 (08/16 0727) SpO2:  [92 %-98 %] 98 % (08/16 0727) Weight:  [88.3 kg (194 lb 10.7 oz)] 88.3 kg (194 lb 10.7 oz) (08/16 0258)   Intake/Output Summary (Last 24 hours) at 10/31/2022 1022 Last data filed at 10/31/2022 1000 Gross per 24 hour  Intake 1566.27 ml  Output 850 ml  Net 716.27 ml    Current HF Medications:  Losartan 12.5 mg daily Spironolactone 12.5 mg daily Farxiga 10 mg daily Digoxin 0.125 mg daily Furosemide 20 mg daily  Prior to admission HF Medications:  None (non-adherence)  Assessment: 1. Acute on chronic systolic heart failure (LVEF in 2023 40-45%), due to NICM. NYHA class III-IV symptoms.  -BP stable, but too soft for further med titration. Euvolemic on exam. Standing weight today 186.9 lbs. Appears this is new dry weight. -Feels great today. Reports improved energy, appetite, and breathing Improved after stopping BB.   Plan: 1) Medication changes recommended at this time: -Consider swapping from [place] to [place] due to insurance formulary. -Consider stopping diltiazem at discharge given HFrEF  2) Patient assistance: -Patient qualifies for copay cards with commercial insurance.  -Deductible is $7,500, therefore copay cards are essential before discharge. Entresto copay cards help cover deductible. Sherryll Burger  copay: $656.56 -Jardiance requires prior authorization -Xarelto copay: $543.58  3) Education: -To be completed prior to discharge.  Medication Assistance / Insurance Benefits Check:  Does the patient have prescription insurance? Prescription Insurance: Commercial ([employer])  Does the patient qualify for medication assistance through manufacturers or grants? Not at this time. Copay cards provided. Can consider patient assistance if limits on  copay cards are met.  Outpatient Pharmacy:  Prior to admission outpatient pharmacy: none  Is the patient willing to use [facility] pharmacy at discharge? Yes  Is the patient willing to transition their outpatient pharmacy to utilize a [facility] outpatient pharmacy?  TBD  Thank you for involving pharmacy in this patient's care.  Enos Fling, PharmD, BCPS Clinical Pharmacist 10/31/2022 10:22 AM

## 2022-10-31 NOTE — Progress Notes (Signed)
Transition of Care [facility]) - Inpatient Brief Assessment   Patient Details  Name: Calvin Esparza MRN: [id number] Date of Birth: 1960/07/03  Transition of Care [facility]) CM/SW Contact:    Darolyn Rua, LCSW Phone Number: 10/31/2022, 9:51 AM   Clinical Narrative:  Patient from home presenting to ED with chest pain and dyspnea, hx of CHF and Substance abuse. CSW notes ED RNCM provided SA resources and consulted pharmacy for patient's report that he had not been getting his prescriptions.   PCP: Dr. Einar Crow Insurance: Monia Pouch [employer]  Please consult [facility] should discharge planning needs arise.   Transition of Care Asessment: Insurance and Status: Insurance coverage has been reviewed Patient has primary care physician: Yes Home environment has been reviewed: go hom Prior level of function:: independent Prior/Current Home Services: No current home services Social Determinants of Health Reivew: SDOH reviewed no interventions necessary Readmission risk has been reviewed: Yes Transition of care needs: no transition of care needs at this time

## 2022-10-31 NOTE — Progress Notes (Signed)
Progress Note  Patient Name: Calvin Esparza Date of Encounter: 10/31/2022  Primary Cardiologist: Mariah Milling  Subjective   No acute overnight cardiac events. Renal function stable off milrinone. BP improved with lower dose GDMT.   Inpatient Medications    Scheduled Meds:  apixaban  5 mg Oral BID   Chlorhexidine Gluconate Cloth  6 each Topical Daily   colchicine  0.6 mg Oral BID   digoxin  0.125 mg Oral Daily   [START ON 11/01/2022] empagliflozin  10 mg Oral Daily   folic acid  1 mg Oral Daily   furosemide  20 mg Oral Daily   losartan  12.5 mg Oral Daily   multivitamin with minerals  1 tablet Oral Daily   polyethylene glycol  17 g Oral BID   sodium chloride flush  10-40 mL Intracatheter Q12H   spironolactone  12.5 mg Oral Daily   thiamine  100 mg Oral Daily   Or   thiamine  100 mg Intravenous Daily   Continuous Infusions:  PRN Meds: acetaminophen, bisacodyl, lactulose, ondansetron (ZOFRAN) IV, sodium chloride flush, sorbitol, milk of mag, mineral oil, glycerin (SMOG) enema, traZODone   Vital Signs    Vitals:   10/31/22 0047 10/31/22 0258 10/31/22 0727 10/31/22 1000  BP: 96/78 102/80 103/82   Pulse: 93 99 100   Resp:  18 18   Temp: 98.2 F (36.8 C) 97.7 F (36.5 C) 97.6 F (36.4 C)   TempSrc: Oral Oral    SpO2:  98% 98%   Weight:  88.3 kg  84.8 kg  Height:        Intake/Output Summary (Last 24 hours) at 10/31/2022 1114 Last data filed at 10/31/2022 1000 Gross per 24 hour  Intake 1566.27 ml  Output 850 ml  Net 716.27 ml   Filed Weights   10/30/22 0358 10/31/22 0258 10/31/22 1000  Weight: 88.3 kg 88.3 kg 84.8 kg    Telemetry    Afib, 90s to 110s bpm - Personally Reviewed  ECG    No new tracings - Personally Reviewed  Physical Exam   GEN: No acute distress.   Neck: No JVD. Cardiac: IRIR, no murmurs, rubs, or gallops.  Respiratory: Clear to auscultation bilaterally.  GI: Soft, nontender, non-distended.   MS: No edema; No deformity. Neuro:  Alert  and oriented x 3; Nonfocal.  Psych: Normal affect.  Labs    Chemistry Recent Labs  Lab 10/29/22 0146 10/30/22 0401 10/31/22 0251  NA 136 137 139  K 3.8 4.2 4.4  CL 102 102 103  CO2 27 25 28   GLUCOSE 146* 116* 95  BUN 21* 18 17  CREATININE 1.14 0.91 1.03  CALCIUM 8.7* 9.0 9.0  GFRNONAA >60 >60 >60  ANIONGAP 7 10 8      Hematology Recent Labs  Lab 10/28/22 0531 10/29/22 0146 10/30/22 0401  WBC 5.5 6.7 8.8  RBC 5.10 5.06 4.95  HGB 14.6 14.6 14.3  HCT 45.8 44.3 44.6  MCV 89.8 87.5 90.1  MCH 28.6 28.9 28.9  MCHC 31.9 33.0 32.1  RDW 16.7* 16.8* 17.0*  PLT 186 177 178    Cardiac EnzymesNo results for input(s): "TROPONINI" in the last 168 hours. No results for input(s): "TROPIPOC" in the last 168 hours.   BNPNo results for input(s): "BNP", "PROBNP" in the last 168 hours.   DDimer No results for input(s): "DDIMER" in the last 168 hours.   Radiology    DG Abd 1 View  Result Date: 10/30/2022 IMPRESSION: No acute abnormality noted.  Electronically Signed   By: Alcide Clever M.D.   On: 10/30/2022 02:25    Cardiac Studies   [facility] 10/27/2022: Conclusions: No angiographically significant coronary artery disease. Upper normal left and right heart filling pressures. Severely reduced Fick cardiac output/index.  Low PAPi suggests a component of right heart failure as well.   Recommendations: Transfer to stepdown and initiate milrinone therapy. Decrease metoprolol succinate in the setting of low-output heart failure. Maintain net even fluid balance. If cardiac output cannot be improved and/or milrinone cannot be weaned off, will need to consider transfer to [facility] for further management by the advanced heart failure team. __________  2D echo 10/24/2022: 1. Left ventricular ejection fraction, by estimation, is 20 to 25%. Left  ventricular ejection fraction by PLAX is 23 %. The left ventricle has  severely decreased function. The left ventricle demonstrates global   hypokinesis. Left ventricular diastolic  parameters are indeterminate.   2. Right ventricular systolic function is mildly reduced. The right  ventricular size is mildly enlarged. There is mildly elevated pulmonary  artery systolic pressure.   3. Left atrial size was severely dilated.   4. Right atrial size was mildly dilated.   5. The mitral valve is normal in structure. Mild to moderate mitral valve  regurgitation.   6. The aortic valve is tricuspid. Aortic valve regurgitation is mild.   7. The inferior vena cava is dilated in size with >50% respiratory  variability, suggesting right atrial pressure of 8 mmHg.  __________  2D echo 07/18/2021: 1. Left ventricular ejection fraction, by estimation, is 40 to 45%. The  left ventricle has mildly decreased function. The left ventricle  demonstrates global hypokinesis. There is mild left ventricular  hypertrophy. Left ventricular diastolic parameters  are indeterminate.   2. Right ventricular systolic function is normal. The right ventricular  size is normal. Tricuspid regurgitation signal is inadequate for assessing  PA pressure.   3. Left atrial size was moderately dilated.   4. The mitral valve is normal in structure. Mild mitral valve  regurgitation. No evidence of mitral stenosis.   5. The aortic valve is normal in structure. Aortic valve regurgitation is  mild. No aortic stenosis is present.   6. The inferior vena cava is normal in size with greater than 50%  respiratory variability, suggesting right atrial pressure of 3 mmHg.   Patient Profile     62 y.o. male with history of chronic combined systolic and diastolic CHF with NICM, permanent Afib, PE in 2021, noncompliance, EtOH use, chronic pain, and gout who was admitted on 10/24/22 with volume overload and Afib with RVR in the setting of medication noncompliance.   Assessment & Plan    1. Acute on chronic combined systolic and diastolic CHF/NICM: -Volume status improved -With  noted progression of cardiomyopathy, he underwent R/LHC this admission that showed no significant obstructive CAD as above  -Has been weaned off milrinone infusion with stable Coox yesterday -Continue current medical therapy including losartan, spironolactone, Jardiance, Lasix, and digoxin -Relative hypotension precludes further escalation of GDMT, including transition from ARB to ARNI or resumption of beta blocker  2. Permanent Afib: -Ventricular rates 90s to 110s bpm -Digoxin  -Metoprolol has been held following 8/14 dose due to relative hypotension, restart as able -CHADS2VASc at least 2 (CHF, vascular disease)  -Eliquis 5 mg bid (does not meet reduced dosing criteria)  3. Polysubstance abuse: -Complete cessation advised      For questions or updates, please contact [facility] Please consult  [email or web address] for contact info under Cardiology/STEMI.    Signed, Eula Listen, PA-C [facility] Pager: [phone] 10/31/2022, 11:14 AM

## 2022-10-31 NOTE — Progress Notes (Signed)
Essie Hart RN aware of order to remove PICC line.

## 2022-11-03 ENCOUNTER — Telehealth: Payer: Self-pay | Admitting: *Deleted

## 2022-11-03 NOTE — Progress Notes (Signed)
  Care Coordination   Note   11/03/2022 Name: Calvin Esparza MRN: [id number] DOB: 08/18/1960  Calvin Esparza is a 62 y.o. year old male who sees Lauro Regulus, MD for primary care. I reached out to [name] by phone today to offer care coordination services.  [name] was given information about Care Coordination services today including:   The Care Coordination services include support from the care team which includes your Nurse Coordinator, Clinical Social Worker, or Pharmacist.  The Care Coordination team is here to help remove barriers to the health concerns and goals most important to you. Care Coordination services are voluntary, and the patient may decline or stop services at any time by request to their care team member.   Care Coordination Consent Status: Patient agreed to services and verbal consent obtained.   Follow up plan:  Telephone appointment with care coordination team member scheduled for:  11/07/2022  Encounter Outcome:  Pt. Scheduled from referral   Burman Nieves, [facility] Care Coordination Care Guide Direct Dial: [phone]

## 2022-11-06 ENCOUNTER — Encounter: Payer: Self-pay | Admitting: Family

## 2022-11-06 ENCOUNTER — Encounter: Payer: Self-pay | Admitting: Pharmacy Technician

## 2022-11-06 ENCOUNTER — Ambulatory Visit: Payer: 59 | Attending: Family | Admitting: Family

## 2022-11-06 VITALS — BP 109/85 | HR 53 | Wt 189.0 lb

## 2022-11-06 DIAGNOSIS — I4811 Longstanding persistent atrial fibrillation: Secondary | ICD-10-CM | POA: Diagnosis not present

## 2022-11-06 DIAGNOSIS — I5020 Unspecified systolic (congestive) heart failure: Secondary | ICD-10-CM | POA: Diagnosis not present

## 2022-11-06 DIAGNOSIS — I5022 Chronic systolic (congestive) heart failure: Secondary | ICD-10-CM | POA: Insufficient documentation

## 2022-11-06 DIAGNOSIS — I4819 Other persistent atrial fibrillation: Secondary | ICD-10-CM | POA: Diagnosis not present

## 2022-11-06 DIAGNOSIS — I272 Pulmonary hypertension, unspecified: Secondary | ICD-10-CM | POA: Insufficient documentation

## 2022-11-06 DIAGNOSIS — E785 Hyperlipidemia, unspecified: Secondary | ICD-10-CM | POA: Diagnosis not present

## 2022-11-06 DIAGNOSIS — Z7901 Long term (current) use of anticoagulants: Secondary | ICD-10-CM | POA: Insufficient documentation

## 2022-11-06 DIAGNOSIS — M109 Gout, unspecified: Secondary | ICD-10-CM | POA: Insufficient documentation

## 2022-11-06 DIAGNOSIS — I10 Essential (primary) hypertension: Secondary | ICD-10-CM | POA: Diagnosis not present

## 2022-11-06 DIAGNOSIS — Z79899 Other long term (current) drug therapy: Secondary | ICD-10-CM | POA: Insufficient documentation

## 2022-11-06 DIAGNOSIS — M25532 Pain in left wrist: Secondary | ICD-10-CM | POA: Insufficient documentation

## 2022-11-06 DIAGNOSIS — I110 Hypertensive heart disease with heart failure: Secondary | ICD-10-CM | POA: Diagnosis not present

## 2022-11-06 DIAGNOSIS — R0609 Other forms of dyspnea: Secondary | ICD-10-CM | POA: Insufficient documentation

## 2022-11-06 MED ORDER — FUROSEMIDE 20 MG PO TABS: 20.0000 mg | ORAL_TABLET | ORAL | 3 refills | Status: DC | PRN

## 2022-11-06 NOTE — Patient Instructions (Addendum)
Call Dr Ewell Poe office to get a hospital follow-up appointment scheduled. His number is [phone]   Take your furosemide (lasix) ONLY if you need it for swelling, shortness of breath or overnight weight gain of > 3 pounds.

## 2022-11-06 NOTE — Progress Notes (Signed)
PCP: Calvin Crow, MD (last seen 01/23) Primary Cardiologist: Calvin Listen, PA (last seen 07/23; returns 09/24)  HPI:   Calvin Esparza is a 62 y/o male with a history of atrial fibrillation, HTN, hyperlipidemia, PE, alcohol use, tobacco use (chewing tobacco) and chronic heart failure.   Admitted 07/17/21 due to SOB due to HF exacerbation. Initially given IV lasix with transition to oral diuretics. Elevated troponin thought to be due to demand ischemia. Discharged after 4 days.   Admitted 10/24/22 due to worsening of fatigue, exertional dyspnea, and increasing leg swelling. Patient stopped taking all his medications January of this year due to insurance coverage issue. Chest x-ray showed cardiomegaly and pulmonary congestion, CT angiogram negative for PE. Cardiology consult obtained. Echo showed EF 20-25%, global hypokinesis, indeterminate diastolic parameters, RV fxn reduced, mild pulm HTN, RA and LA dilation, mild/mod Calvin. L and R Cardiac cath 8/12 without significant obstructive disease. Started on milrinone gtt due to hypotension after procedure and then weaned off. Meds re-introduced.   Echo 06/08/19: EF 35-40% along with mild LVH and mild LAE.  Echo 07/18/21: EF 40-45% along with mild LVH, mild Calvin and moderate LAE.  Echo 10/24/22: EF 20-25% along with mildly elevated PA pressure, severe LAE, mild AR and mild/ moderate Calvin  RHC/LHC 10/27/22: No angiographically significant coronary artery disease. Upper normal left and right heart filling pressures. Severely reduced Fick cardiac output/index.  Low PAPi suggests a component of right heart failure as well.  He presents today for a follow-up visit with a chief complaint of gout in hands. Has associated redness/ swelling in his right pinky finger and left wrist pain along with this. Denies fatigue, shortness of breath, cough, chest pain, palpitations, abdominal distention, pedal edema, dizziness, difficulty sleeping or weight gain. Currently wearing wrist  brace on his left wrist as he doesn't know if he's experiencing carpal tunnel or not.   ROS: All systems negative except as listed in HPI, PMH and Problem List.  SH:  Social History   Socioeconomic History   Marital status: Married    Spouse name: Not on file   Number of children: Not on file   Years of education: Not on file   Highest education level: Not on file  Occupational History   Not on file  Tobacco Use   Smoking status: Never   Smokeless tobacco: Current    Types: Chew  Vaping Use   Vaping status: Never Used  Substance and Sexual Activity   Alcohol use: Yes    Alcohol/week: 7.0 standard drinks of alcohol    Types: 7 Cans of beer per week    Comment: He states he hasn't drank in the past 2 weeks but has been a heavy drinker in the past   Drug use: Not Currently   Sexual activity: Not on file  Other Topics Concern   Not on file  Social History Narrative   Lives locally w/ wife.  Does not routinely exercise.  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   Social Determinants of Health   Financial Resource Strain: Not on file  Food Insecurity: No Food Insecurity (10/24/2022)   Hunger Vital Sign    Worried About Running Out of Food in the Last Year: Never true    Ran Out of Food in the Last Year: Never true  Transportation Needs: No Transportation Needs (10/24/2022)   PRAPARE - Administrator, Civil Service (Medical): No    Lack of Transportation (Non-Medical): No  Physical Activity: Not  on file  Stress: Not on file  Social Connections: Not on file  Intimate Partner Violence: Not At Risk (10/24/2022)   Humiliation, Afraid, Rape, and Kick questionnaire    Fear of Current or Ex-Partner: No    Emotionally Abused: No    Physically Abused: No    Sexually Abused: No    FH:  Family History  Problem Relation Age of Onset   Bone cancer Father    Stroke Father    Diabetes Sister     Past Medical History:  Diagnosis Date   Alcohol abuse    Chronic pain     COVID-19    a. 02/2019   HFrEF (heart failure with reduced ejection fraction) (HCC)    a. 05/2019 Echo: EF 35-40%; b. 07/2021 Echo: EF 40-45%; c. 10/2022 Echo: EF 20-25%, glob HK, mildly reduced RV fxn, sev dil LA, mid-mod Calvin, mild AI.   History of medication noncompliance    Longstanding persistent atrial fibrillation (HCC)    a. Dx 03/2018; b. 05/2019 recurrent Afib in setting of PE. CHA2DS2VASc = 1-->Eliquis later changed to xarelto; b. 07/2019 s/p DCCV (150J); d. 08/2019 admit w/ AF RVR in settting of noncompliance - longstanding persistent afib since on bb/ccb rx.   NICM (nonischemic cardiomyopathy) (HCC)    a. 05/2019 Echo: EF 35-40%; b. 05/2019 MV: EF 32%, no ischemia; c. 07/2021 Echo: EF 40-45%; d. 10/2022 Echo: EF 20-25%, glob HK.   Pulmonary embolism (HCC)    a. 05/2019 CTA Chest: mild amt of PE w/in a lower lobe branch of RPA; b. 08/2019 CTA Chest: Small PE in RML and RLL PA.    Current Outpatient Medications  Medication Sig Dispense Refill   apixaban (ELIQUIS) 5 MG TABS tablet Take 1 tablet (5 mg total) by mouth 2 (two) times daily. 180 tablet 0   colchicine 0.6 MG tablet Take 1 tablet (0.6 mg total) by mouth daily as needed. 90 tablet 0   digoxin (LANOXIN) 0.125 MG tablet Take 1 tablet (0.125 mg total) by mouth daily. 90 tablet 0   empagliflozin (JARDIANCE) 10 MG TABS tablet Take 1 tablet (10 mg total) by mouth daily. 90 tablet 0   furosemide (LASIX) 20 MG tablet Take 1 tablet (20 mg total) by mouth daily. 90 tablet 0   losartan (COZAAR) 25 MG tablet Take 0.5 tablets (12.5 mg total) by mouth daily. 90 tablet 0   spironolactone (ALDACTONE) 25 MG tablet Take 0.5 tablets (12.5 mg total) by mouth daily. 45 tablet 0   No current facility-administered medications for this visit.   Vitals:   11/06/22 1026  BP: 109/85  Pulse: (!) 53  SpO2: 99%  Weight: 189 lb (85.7 kg)   Wt Readings from Last 3 Encounters:  11/06/22 189 lb (85.7 kg)  10/31/22 186 lb 14.4 oz (84.8 kg)  11/28/21 198 lb 6 oz  (90 kg)   Lab Results  Component Value Date   CREATININE 1.03 10/31/2022   CREATININE 0.91 10/30/2022   CREATININE 1.14 10/29/2022   PHYSICAL EXAM:  General:  Well appearing. No resp difficulty HEENT: normal Neck: supple. JVP flat. No lymphadenopathy or thryomegaly appreciated. Cor: PMI normal. Bradycardic, irregular rhythm. No rubs, gallops or murmurs. Lungs: clear Abdomen: soft, nontender, nondistended. No hepatosplenomegaly. No bruits or masses.  Extremities: no cyanosis, clubbing, rash, edema Neuro: alert & oriented x 3, cranial nerves grossly intact. Moves all 4 extremities w/o difficulty. Affect pleasant.   ECG: 10/25/22 showed AF RVR with HR 116   ASSESSMENT &  PLAN:  1: Chronic heart failure with reduced ejection fraction- - suspect due to AF as cath showed nonobstructive CAD - NYHA class I - euvolemic today - weighing daily; reminded to call for an overnight weight gain of >2 pounds or a weekly weight gain of >5 pounds - weight down 9 pounds from last visit here 1 year ago - Echo 06/08/19: EF 35-40% along with mild LVH and mild LAE.  - Echo 07/18/21: EF 40-45% along with mild LVH, mild Calvin and moderate LAE.  - Echo 10/24/22: EF 20-25% along with mildly elevated PA pressure, severe LAE, mild AR and mild/ moderate Calvin - RHC/LHC 10/27/22:   No angiographically significant coronary artery disease.   Upper normal left and right heart filling pressures.   Severely reduced Fick cardiac output/index.  Low PAPi suggests a component of right heart failure as well. - not adding salt to his food and tries to be aware of his sodium intake - discussed the importance of keeping his daily fluid intake to ~ 64 ounces - continue digoxin 0.125mg  daily - dig level 10/31/22 was 0.5 - continue jardiance 10mg  daily - continue losartan 25mg  daily; consider transitioning to entresto if BP maintains - continue spironolactone 25mg  daily - stop daily furosemide and take PRN for above weight gain,  swelling or SOB; gout could be related to diuretic usage - BMP today - BNP 10/24/22 was 910.5 - PharmD reconciled meds w/ patient   2: Atrial fibrillation/ PE- - saw cardiology (Calvin Esparza) 07/23 - continue apixaban 5mg  BID - continue digoxin 0.125mg  daily  3: HTN- - BP 109/85 - Telephone visit PCP Calvin Esparza) 01/23; emphasized getting f/u visit scheduled - BMP 10/31/22 reviewed and showed sodium 139, potassium 4.4, creatinine 1.03 and GFR >60 - BMP today  4: Gout- - currently taking colchicine 0.6mg  daily due to gout flare - right pinky finger is red, swollen and warm; emphasized getting PCP appt - can try soaking it in warm epsom salt in interim  5: Hyperlipdemia- - LDL 07/10/20 was 133 - lipoprotein a 10/28/22 was 34.3 - lipid panel today  Return in 1 month, sooner if needed.

## 2022-11-06 NOTE — Progress Notes (Signed)
[facility] - HEART FAILURE CLINIC - PHARMACIST COUNSELING NOTE  Guideline-Directed Medical Therapy/Evidence Based Medicine  ACE/ARB/ARNI: Losartan 25 mg daily Beta Blocker:  N/A Aldosterone Antagonist: Spironolactone 25 mg daily Diuretic: Furosemide 20 mg daily SGLT2i: Empagliflozin 10 mg daily  Adherence Assessment  Do you ever forget to take your medication? [] Yes [x] No  Do you ever skip doses due to side effects? [] Yes [x] No  Do you have trouble affording your medicines? [x] Yes [] No  Are you ever unable to pick up your medication due to transportation difficulties? [] Yes [x] No  Do you ever stop taking your medications because you don't believe they are helping? [] Yes [x] No  Do you check your weight daily? [x] Yes [] No   Adherence strategy: No system. Patient takes medications out of pill bottles, but he is fresh out of hospital and motivated. As we move further past the admission, a pillbox would be a good idea for patient to maintain adherence.  Barriers to obtaining medications: Cost ($$$ medications like Jardiance)  Vital signs: HR 53, BP 109/85, weight (pounds) 189 ECHO: Date 10/24/2022, EF 20-25%, notes LV global hypokinesis, LA severe dilation, elevated PA pressure     Latest Ref Rng & Units 10/31/2022    2:51 AM 10/30/2022    4:01 AM 10/29/2022    1:46 AM  BMP  Glucose 70 - 99 mg/dL 95  161  096   BUN 6 - 20 mg/dL 17  18  21    Creatinine 0.61 - 1.24 mg/dL 0.45  4.09  8.11   Sodium 135 - 145 mmol/L 139  137  136   Potassium 3.5 - 5.1 mmol/L 4.4  4.2  3.8   Chloride 98 - 111 mmol/L 103  102  102   CO2 22 - 32 mmol/L 28  25  27    Calcium 8.9 - 10.3 mg/dL 9.0  9.0  8.7     Past Medical History:  Diagnosis Date   Alcohol abuse    Chronic pain    COVID-19    a. 02/2019   HFrEF (heart failure with reduced ejection fraction) (HCC)    a. 05/2019 Echo: EF 35-40%; b. 07/2021 Echo: EF 40-45%; c. 10/2022 Echo: EF 20-25%, glob HK, mildly reduced RV fxn,  sev dil LA, mid-mod MR, mild AI.   History of medication noncompliance    Longstanding persistent atrial fibrillation (HCC)    a. Dx 03/2018; b. 05/2019 recurrent Afib in setting of PE. CHA2DS2VASc = 1-->Eliquis later changed to xarelto; b. 07/2019 s/p DCCV (150J); d. 08/2019 admit w/ AF RVR in settting of noncompliance - longstanding persistent afib since on bb/ccb rx.   NICM (nonischemic cardiomyopathy) (HCC)    a. 05/2019 Echo: EF 35-40%; b. 05/2019 MV: EF 32%, no ischemia; c. 07/2021 Echo: EF 40-45%; d. 10/2022 Echo: EF 20-25%, glob HK.   Pulmonary embolism (HCC)    a. 05/2019 CTA Chest: mild amt of PE w/in a lower lobe branch of RPA; b. 08/2019 CTA Chest: Small PE in RML and RLL PA.    ASSESSMENT 62 year old male who presents to the HF clinic after recent discharge for hospitalization for Afib with RVR and AoCHF. Patient is following up regarding his HFrEF. Metoprolol was discontinued during that admission.  Patient adherent to medications and has not missed any doses since discharge. He is motivated regarding his health from that admission. Endorses having not taking his medications in 2024 prior to that admission due to inability to afford. Patient has established medication access now.  He endorses taking whole tablets of spironolactone and losartan. He hadn't noticed the half tablet instructions. While he had been discharge on spiro 12.5 mg daily and losartan 12.5 mg daily, this means he has been taking 25 mg of each.  Patient remarked on being very thirsty. He is down 6 lbs from date of admission but 3 lbs above weight at time of discharge. Will discuss with Inetta Fermo about adjusting frequency of furosemide to help with QOL re: thirst given that he is now at target dose of spironolactone.  Recent ED Visit (past 6 months): Date - 10/24/2022, CC - Afib with RVR and AoCHF  PLAN HFrEF (EF 20-25%) Change furosemide to PRN Patient educated on importance of checking weights daily and keeping fluids below 2  L/day since he will be receiving less Lasix. Patient to call office for 3lb/24h or 5 lb/7d wt gain Continue Losartan 25 mg daily, Spironolactone 25 mg daily, Jardiance 10 mg daily, and Digoxin 0.125 mg daily    Time spent: 20 minutes  Will M. Dareen Piano, PharmD Clinical Pharmacist 11/06/2022 12:42 PM

## 2022-11-07 ENCOUNTER — Telehealth: Payer: Self-pay

## 2022-11-07 ENCOUNTER — Ambulatory Visit: Payer: Self-pay | Admitting: *Deleted

## 2022-11-07 DIAGNOSIS — I5020 Unspecified systolic (congestive) heart failure: Secondary | ICD-10-CM

## 2022-11-07 LAB — BASIC METABOLIC PANEL
BUN/Creatinine Ratio: 16 (ref 10–24)
BUN: 18 mg/dL (ref 8–27)
CO2: 21 mmol/L (ref 20–29)
Calcium: 10.1 mg/dL (ref 8.6–10.2)
Chloride: 101 mmol/L (ref 96–106)
Creatinine, Ser: 1.11 mg/dL (ref 0.76–1.27)
Glucose: 84 mg/dL (ref 70–99)
Potassium: 5.1 mmol/L (ref 3.5–5.2)
Sodium: 140 mmol/L (ref 134–144)
eGFR: 76 mL/min/{1.73_m2} (ref >59)

## 2022-11-07 LAB — LIPID PANEL
Chol/HDL Ratio: 5.4 ratio — ABNORMAL HIGH (ref 0.0–5.0)
Cholesterol, Total: 210 mg/dL — ABNORMAL HIGH (ref 100–199)
HDL: 39 mg/dL — ABNORMAL LOW (ref >39)
LDL Chol Calc (NIH): 139 mg/dL — ABNORMAL HIGH (ref 0–99)
Triglycerides: 177 mg/dL — ABNORMAL HIGH (ref 0–149)
VLDL Cholesterol Cal: 32 mg/dL (ref 5–40)

## 2022-11-07 MED ORDER — ATORVASTATIN CALCIUM 40 MG PO TABS: 40.0000 mg | ORAL_TABLET | Freq: Every day | ORAL | 3 refills | Status: DC

## 2022-11-07 MED ORDER — SPIRONOLACTONE 25 MG PO TABS: 12.5000 mg | ORAL_TABLET | Freq: Every day | ORAL | 3 refills | Status: DC

## 2022-11-07 NOTE — Telephone Encounter (Addendum)
Called pt to make him aware of Verdon Cummins , FNP recommendations.   Order for Atorvastatin 40 ( 1 tablet) daily. And Spironolactone 12.5 mg (0.5 tablet) daily.  Pt to come in 10 days for lab work. Lab orders placed.   Pt aware, agreeable, and verbalized understanding .  ----- Message from [facility]  ----- LDL (Bad cholesterol) is higher than we'd like it so begin atorvastatin 40mg  daily. Kidney function looks great. Potassium is normal although on the high side so decrease the spironolactone to 1/2 tablet daily.  BMET in 10 days.

## 2022-11-07 NOTE — Patient Outreach (Signed)
  Care Coordination   Initial Visit Note   11/08/2022 Name: Calvin Esparza MRN: [id number] DOB: 1960-03-19  Calvin Esparza is a 61 y.o. year old male who sees Calvin Regulus, MD for primary care. I spoke with  Calvin Esparza by phone today.  What matters to the patients health and wellness today?  Report he is feeling better since discharge home, will continue to be adherent to plan of care to prevent readmission.  Will contact this care manager with questions.     Goals Addressed             This Visit's Progress    COMPLETED: Effective management of CHF       Interventions Today    Flowsheet Row Most Recent Value  Chronic Disease   Chronic disease during today's visit Congestive Heart Failure (CHF)  [EF 20-25%]  General Interventions   General Interventions Discussed/Reviewed General Interventions Reviewed, Doctor Visits, Durable Medical Equipment (DME)  Doctor Visits Discussed/Reviewed Doctor Visits Reviewed, PCP, Specialist  [8/26 with PCP, 9/16 with HF, 9/18 with Cardiology]  Durable Medical Equipment (DME) Other, BP Cuff  [scale]  PCP/Specialist Visits Compliance with follow-up visit  Exercise Interventions   Exercise Discussed/Reviewed Weight Managment  Weight Management Weight maintenance  [report 187-189 pounds since discharge, aware to notify provider with weight gain greater than 3 pound in 1 day and 5 pounds in a week]  Education Interventions   Education Provided Provided Education  Provided Verbal Education On Nutrition, When to see the doctor, Medication  Nutrition Interventions   Nutrition Discussed/Reviewed Nutrition Discussed, Adding fruits and vegetables, Portion sizes, Decreasing fats, Decreasing salt  Pharmacy Interventions   Pharmacy Dicussed/Reviewed Affording Medications, Pharmacy Topics Discussed, Referral to Pharmacist  [Report Eliquis can be expansive]  Referral to Pharmacist Cannot afford medications              SDOH assessments  and interventions completed:  Yes  SDOH Interventions Today    Flowsheet Row Most Recent Value  SDOH Interventions   Food Insecurity Interventions Intervention Not Indicated  Housing Interventions Intervention Not Indicated  Transportation Interventions Intervention Not Indicated        Care Coordination Interventions:  Yes, provided   Follow up plan: Referral made to pharmacy team    Encounter Outcome:  Pt. Visit Completed   Kemper Durie, RN, MSN, [facility] Care Management Care Management Coordinator [phone]

## 2022-11-10 DIAGNOSIS — M10049 Idiopathic gout, unspecified hand: Secondary | ICD-10-CM | POA: Diagnosis not present

## 2022-11-10 DIAGNOSIS — I4891 Unspecified atrial fibrillation: Secondary | ICD-10-CM | POA: Diagnosis not present

## 2022-11-10 DIAGNOSIS — I10 Essential (primary) hypertension: Secondary | ICD-10-CM | POA: Diagnosis not present

## 2022-11-10 DIAGNOSIS — I5022 Chronic systolic (congestive) heart failure: Secondary | ICD-10-CM | POA: Diagnosis not present

## 2022-11-11 ENCOUNTER — Telehealth: Payer: Self-pay | Admitting: Pharmacy Technician

## 2022-11-11 DIAGNOSIS — Z5986 Financial insecurity: Secondary | ICD-10-CM

## 2022-11-11 NOTE — Progress Notes (Signed)
[facility] Customer service manager [facility])  [facility] Quality Pharmacy Team   11/11/2022  GAUGE SKLUZACEK [birth year] [id number]  Reason for referral: Medication assistance for Eliquis  Referral source: [facility] RN Kemper Durie Current insurance: [employer] development [employer]  Outreach:  Successful telephone call with patient.  HIPAA identifiers verified. Patient and wife informs that when they went to [facility] to pick up the Eliquis they were having trouble with the $10 copay card given to them by the MD. They informed [facility] said the card appeared to be a duplicate. Patient did inform he was able to receive a free 30 day supply by using another copay card given to him by the MD.  Medication Assistance Findings:  Medication assistance needs identified: Eliquis Patient and wife were agreeable to sign up for a new $10 copay card which was able to be done online thru [employer] website. Patient was able to get a new $10 copay card with the following billing information: BIN: [id number] Group: [id number] ID: [id number]  This information was also texted to the patient's wife per patient's request. Will also mail patient the confirmation.  Called [facility] and spoke to [facility] and provided her the copay card information for Elquis.  Thank you for allowing [facility] pharmacy to be a part of this patient's care.   Pattricia Boss, CPhT [place]  [facility] Office: [phone] Fax: [phone] Email: [email or web address]

## 2022-11-17 ENCOUNTER — Other Ambulatory Visit
Admission: RE | Admit: 2022-11-17 | Discharge: 2022-11-17 | Disposition: A | Discharge status: Home / Self Care | Payer: 59 | Attending: Family | Admitting: Family

## 2022-11-17 DIAGNOSIS — I5020 Unspecified systolic (congestive) heart failure: Secondary | ICD-10-CM | POA: Diagnosis not present

## 2022-11-17 LAB — BASIC METABOLIC PANEL WITH GFR
Anion gap: 12 (ref 5–15)
BUN: 22 mg/dL — ABNORMAL HIGH (ref 6–20)
CO2: 22 mmol/L (ref 22–32)
Calcium: 9.3 mg/dL (ref 8.9–10.3)
Chloride: 104 mmol/L (ref 98–111)
Creatinine, Ser: 0.93 mg/dL (ref 0.61–1.24)
GFR, Estimated: >60 mL/min (ref >60)
Glucose, Bld: 128 mg/dL — ABNORMAL HIGH (ref 70–99)
Potassium: 3.6 mmol/L (ref 3.5–5.1)
Sodium: 138 mmol/L (ref 135–145)

## 2022-11-18 ENCOUNTER — Telehealth: Payer: Self-pay

## 2022-11-25 NOTE — Telephone Encounter (Signed)
error 

## 2022-11-30 NOTE — Progress Notes (Unsigned)
Cardiology Office Note    Date:  12/03/2022   ID:  Calvin Esparza, DOB 29-Mar-1960, MRN [id number]  PCP:  Lauro Regulus, MD  Cardiologist:  Julien Nordmann, MD  Electrophysiologist:  None   Chief Complaint: Follow-up  History of Present Illness:   Calvin Esparza is a 62 y.o. male with history of biventricular failure with NICM, permanent A-fib, PE in 05/2019 and again in 08/2019 in the setting of medical noncompliance, cocaine abuse, alcohol use, medical nonadherence, chronic pain, and gout who presents for hospital follow-up as outlined below.  Calvin Esparza was diagnosed with A-fib in 03/2018 when Calvin Esparza presented with chest pain and dizziness.  Calvin Esparza was placed on beta-blocker and Xarelto, though was intermittently compliant with medical therapy. In 05/2019, Calvin Esparza presented to the ED with chest tightness and dyspnea and was found to be in A-fib with RVR.  CTA of the chest showed mild amount of pulmonary embolism within a lower branch of the right pulmonary artery.  Echo demonstrated an EF of 35 to 40% with grade 1 diastolic dysfunction and moderately enlarged right heart chambers.  Stress testing was undertaken and nonischemic.  Calvin Esparza was discharged on Eliquis with initial plan for rate control.  Calvin Esparza was subsequently transitioned to Xarelto to improve medication compliance and underwent DCCV in 07/2019.  Calvin Esparza was readmitted in 08/2019 in the setting of dyspnea and recurrent A-fib in the context of medication noncompliance.  CTA of the chest showed small pulmonary emboli in the right middle and lower lobe pulmonary arteries.  Calvin Esparza recurrent PE was felt to be secondary to medication nonadherence.  Calvin Esparza was readmitted in 09/2019 with A-fib and presyncope in the setting of dehydration with AKI.  Since then, the focus has been on rate control and anticoagulation adherence.  Calvin Esparza was seen in the ED in 02/2020 with exertional dyspnea and volume overload.  Calvin Esparza was in A-fib with RVR.  Calvin Esparza reported dysphagia and was not adherent with  any of Calvin Esparza pills except Xarelto.  With resumption of medications and IV diuresis Calvin Esparza had significant improvement.  Calvin Esparza underwent EGD which showed multiple gastric erosions, mid esophageal white lesion, and portal gastropathy.  No varices were noted.  Calvin Esparza was admitted to the hospital in 07/2021 with increased shortness of breath and was found to be COVID-positive.  Calvin Esparza was also felt to be volume up and treated with IV Lasix.  Urine drug screen was positive for cocaine.  Echo showed an EF of 40 to 45%, global hypokinesis, normal RV systolic function and ventricular cavity size, moderately dilated left atrium, mild mitral regurgitation, and an estimated right atrial pressure of 3 mmHg.  Calvin Esparza was last seen by general cardiology in 09/2021 and was euvolemic.  Recommendation was made to escalate GDMT as labs and medical adherence of mild.  Since then, Calvin Esparza was followed by the [facility] CHF clinic.   Calvin Esparza was admitted to the hospital in 10/2022 with acute on chronic combined systolic and diastolic CHF and A-fib with RVR in the setting of medication noncompliance.  High-sensitivity troponin negative x 2.  BNP 910.  CTA chest showed no PE or evidence of coronary artery calcification/aortic atherosclerosis.  Echo showed an EF of 20 to 25%, global hypokinesis, mildly reduced RV systolic function with mildly enlarged ventricular cavity size, mildly elevated RVSP estimated at 43.7 mmHg, severe left atrial dilatation, mildly dilated right atrium, mild to moderate mitral regurgitation, mild aortic insufficiency, and an estimated right atrial pressure of 8 mmHg.  Calvin Esparza had symptomatic  improvement with IV diuresis and underwent R/LHC that showed no angiographically significant CAD with upper normal left and right heart filling pressures, severely reduced cardiac output and index, and low PA PI suggestive of a component of right heart failure.  In this setting, Calvin Esparza was initiated on milrinone with improvement in oxygen saturation with subsequent  weaning, and recommendation to follow-up with the advanced heart failure service in the outpatient setting.  At time of discharge it was recommended Calvin Esparza stop diltiazem, Farxiga, Toprol-XL, KCl, torsemide, and rivaroxaban.  From a cardiac perspective, it was recommended Calvin Esparza take apixaban 5 mg twice daily, digoxin 0.125 mg daily, Jardiance 10 mg, furosemide 20 mg daily, losartan 12.5 mg daily, and spironolactone 12.5 mg daily.  Calvin Esparza was most recently seen by the [facility] CHF clinic on 12/01/2022 and was unclear what medications Calvin Esparza was taking.    Calvin Esparza comes in today and is without symptoms of angina or cardiac decompensation.  No dyspnea, palpitations, dizziness, presyncope, or syncope.  No lower extremity swelling, abdominal distention, or progressive orthopnea.  No falls, hematochezia, or melena.  Calvin Esparza indicates Calvin Esparza has not used cocaine since leading up to Calvin Esparza admission last month.  Reports adherence to apixaban, atorvastatin, digoxin, Jardiance, furosemide, losartan, and spironolactone.  Calvin Esparza has been taking furosemide on a daily basis since Calvin Esparza hospital discharge.  Calvin Esparza does not add salt to food though eats out at restaurants/takeout a couple days per week.  When compared to Calvin Esparza last in person clinic visit in this office (09/2021), Calvin Esparza weight is down 6 pounds.   Labs independently reviewed: 11/2022 - potassium 3.6, BUN 22, serum creatinine 0.93 10/2022 - TC 210, TG 177, HDL 39, LDL 139, digoxin 0.5, magnesium 2.5, Hgb 14.3, PLT 178, LP(a) 34, A1c 6.4 06/2020 - albumin 4.1, AST/ALT normal 02/2020 - TSH normal  Past Medical History:  Diagnosis Date   Alcohol abuse    Chronic pain    COVID-19    a. 02/2019   HFrEF (heart failure with reduced ejection fraction) (HCC)    a. 05/2019 Echo: EF 35-40%; b. 07/2021 Echo: EF 40-45%; c. 10/2022 Echo: EF 20-25%, glob HK, mildly reduced RV fxn, sev dil LA, mid-mod MR, mild AI.   History of medication noncompliance    Longstanding persistent atrial fibrillation (HCC)    a. Dx  03/2018; b. 05/2019 recurrent Afib in setting of PE. CHA2DS2VASc = 1-->Eliquis later changed to xarelto; b. 07/2019 s/p DCCV (150J); d. 08/2019 admit w/ AF RVR in settting of noncompliance - longstanding persistent afib since on bb/ccb rx.   NICM (nonischemic cardiomyopathy) (HCC)    a. 05/2019 Echo: EF 35-40%; b. 05/2019 MV: EF 32%, no ischemia; c. 07/2021 Echo: EF 40-45%; d. 10/2022 Echo: EF 20-25%, glob HK.   Pulmonary embolism (HCC)    a. 05/2019 CTA Chest: mild amt of PE w/in a lower lobe branch of RPA; b. 08/2019 CTA Chest: Small PE in RML and RLL PA.    Past Surgical History:  Procedure Laterality Date   CARDIOVERSION N/A 07/29/2019   Procedure: CARDIOVERSION;  Surgeon: Antonieta Iba, MD;  Location: [facility] ORS;  Service: Cardiovascular;  Laterality: N/A;   COLONOSCOPY WITH PROPOFOL N/A 05/24/2020   Procedure: COLONOSCOPY WITH PROPOFOL;  Surgeon: Toney Reil, MD;  Location: [facility] ENDOSCOPY;  Service: Gastroenterology;  Laterality: N/A;   ESOPHAGOGASTRODUODENOSCOPY (EGD) WITH PROPOFOL N/A 02/27/2020   Procedure: ESOPHAGOGASTRODUODENOSCOPY (EGD) WITH PROPOFOL;  Surgeon: Toney Reil, MD;  Location: [facility] ENDOSCOPY;  Service: Gastroenterology;  Laterality: N/A;  ESOPHAGOGASTRODUODENOSCOPY (EGD) WITH PROPOFOL N/A 05/24/2020   Procedure: ESOPHAGOGASTRODUODENOSCOPY (EGD) WITH PROPOFOL;  Surgeon: Toney Reil, MD;  Location: [facility] ENDOSCOPY;  Service: Gastroenterology;  Laterality: N/A;   RIGHT/LEFT HEART CATH AND CORONARY ANGIOGRAPHY N/A 10/27/2022   Procedure: RIGHT/LEFT HEART CATH AND CORONARY ANGIOGRAPHY;  Surgeon: Yvonne Kendall, MD;  Location: [facility] INVASIVE CV LAB;  Service: Cardiovascular;  Laterality: N/A;    Current Medications: Current Meds  Medication Sig   acetaminophen (TYLENOL) 500 MG tablet Take 500 mg by mouth 2 (two) times daily as needed for moderate pain or mild pain.   allopurinol (ZYLOPRIM) 300 MG tablet Take 300 mg by mouth daily.   apixaban (ELIQUIS) 5 MG  TABS tablet Take 1 tablet (5 mg total) by mouth 2 (two) times daily.   atorvastatin (LIPITOR) 40 MG tablet Take 1 tablet (40 mg total) by mouth daily.   colchicine 0.6 MG tablet Take 1 tablet (0.6 mg total) by mouth daily as needed. (Patient taking differently: Take 0.6 mg by mouth 2 (two) times daily.)   digoxin (LANOXIN) 0.125 MG tablet Take 1 tablet (0.125 mg total) by mouth daily.   empagliflozin (JARDIANCE) 10 MG TABS tablet Take 1 tablet (10 mg total) by mouth daily.   furosemide (LASIX) 20 MG tablet Take 1 tablet (20 mg total) by mouth as needed. (Patient taking differently: Take 20 mg by mouth daily.)   metoprolol succinate (TOPROL XL) 25 MG 24 hr tablet Take 0.5 tablets (12.5 mg total) by mouth daily.   spironolactone (ALDACTONE) 25 MG tablet Take 0.5 tablets (12.5 mg total) by mouth daily.   [DISCONTINUED] losartan (COZAAR) 25 MG tablet Take 0.5 tablets (12.5 mg total) by mouth daily.    Allergies:   Patient has no known allergies.   Social History   Socioeconomic History   Marital status: Married    Spouse name: Not on file   Number of children: Not on file   Years of education: Not on file   Highest education level: Not on file  Occupational History   Not on file  Tobacco Use   Smoking status: Never   Smokeless tobacco: Current    Types: Chew  Vaping Use   Vaping status: Never Used  Substance and Sexual Activity   Alcohol use: Yes    Alcohol/week: 7.0 standard drinks of alcohol    Types: 7 Cans of beer per week    Comment: Calvin Esparza states Calvin Esparza hasn't drank in the past 2 weeks but has been a heavy drinker in the past   Drug use: Not Currently   Sexual activity: Not on file  Other Topics Concern   Not on file  Social History Narrative   Lives locally w/ wife.  Does not routinely exercise.  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   Social Determinants of Health   Financial Resource Strain: Not on file  Food Insecurity: No Food Insecurity (11/07/2022)   Hunger Vital Sign     Worried About Running Out of Food in the Last Year: Never true    Ran Out of Food in the Last Year: Never true  Transportation Needs: No Transportation Needs (11/07/2022)   PRAPARE - Administrator, Civil Service (Medical): No    Lack of Transportation (Non-Medical): No  Physical Activity: Not on file  Stress: Not on file  Social Connections: Not on file     Family History:  The patient's family history includes Bone cancer in Calvin Esparza father; Diabetes in Calvin Esparza sister; Stroke in  Calvin Esparza father.  ROS:   12-point review of systems is negative unless otherwise noted in the HPI.   EKGs/Labs/Other Studies Reviewed:    Studies reviewed were summarized above. The additional studies were reviewed today:  2D echo 06/08/2019:  1. Left ventricular ejection fraction, by estimation, is 35 to 40%. The  left ventricle has moderately decreased function. The left ventricle  demonstrates global hypokinesis. There is mild left ventricular  hypertrophy. Left ventricular diastolic  parameters are consistent with Grade I diastolic dysfunction (impaired  relaxation).   2. Right ventricular systolic function is mildly reduced. The right  ventricular size is moderately enlarged.   3. Left atrial size was mildly dilated.   4. Right atrial size was mild to moderately dilated.   5. The mitral valve is grossly normal. No evidence of mitral valve  regurgitation.   6. The aortic valve is tricuspid. Aortic valve regurgitation is trivial.   7. The inferior vena cava is normal in size with greater than 50%  respiratory variability, suggesting right atrial pressure of 3 mmHg. __________   Eugenie Birks MPI 06/10/2019: Pharmacological myocardial perfusion imaging study with no significant ischemia Moderate global hypokinesis, EF estimated at 32% No EKG changes concerning for ischemia at peak stress or in recovery. Baseline EKG with atrial fibrillation, rate 100 bpm Low risk scan ___________   2D echo  07/18/2021: 1. Left ventricular ejection fraction, by estimation, is 40 to 45%. The  left ventricle has mildly decreased function. The left ventricle  demonstrates global hypokinesis. There is mild left ventricular  hypertrophy. Left ventricular diastolic parameters  are indeterminate.   2. Right ventricular systolic function is normal. The right ventricular  size is normal. Tricuspid regurgitation signal is inadequate for assessing  PA pressure.   3. Left atrial size was moderately dilated.   4. The mitral valve is normal in structure. Mild mitral valve  regurgitation. No evidence of mitral stenosis.   5. The aortic valve is normal in structure. Aortic valve regurgitation is  mild. No aortic stenosis is present.   6. The inferior vena cava is normal in size with greater than 50%  respiratory variability, suggesting right atrial pressure of 3 mmHg. __________  2D echo 10/24/2022: 1. Left ventricular ejection fraction, by estimation, is 20 to 25%. Left  ventricular ejection fraction by PLAX is 23 %. The left ventricle has  severely decreased function. The left ventricle demonstrates global  hypokinesis. Left ventricular diastolic  parameters are indeterminate.   2. Right ventricular systolic function is mildly reduced. The right  ventricular size is mildly enlarged. There is mildly elevated pulmonary  artery systolic pressure.   3. Left atrial size was severely dilated.   4. Right atrial size was mildly dilated.   5. The mitral valve is normal in structure. Mild to moderate mitral valve  regurgitation.   6. The aortic valve is tricuspid. Aortic valve regurgitation is mild.   7. The inferior vena cava is dilated in size with >50% respiratory  variability, suggesting right atrial pressure of 8 mmHg.  __________  [facility] 10/27/2022: Conclusions: No angiographically significant coronary artery disease. Upper normal left and right heart filling pressures. Severely reduced Fick cardiac  output/index.  Low PAPi suggests a component of right heart failure as well.   Recommendations: Transfer to stepdown and initiate milrinone therapy. Decrease metoprolol succinate in the setting of low-output heart failure. Maintain net even fluid balance. If cardiac output cannot be improved and/or milrinone cannot be weaned off, will need to consider  transfer to [facility] for further management by the advanced heart failure team.    EKG:  EKG is ordered today.  The EKG ordered today demonstrates atrial fibrillation with RVR, 111 bpm, LVH, poor R wave progression along the precordial leads, nonspecific ST-T changes  Recent Labs: 10/24/2022: B Natriuretic Peptide 910.5 10/31/2022: Magnesium 2.5 12/03/2022: BUN 17; Creatinine, Ser 1.01; Hemoglobin 15.5; Platelets 176; Potassium 3.9; Sodium 140  Recent Lipid Panel    Component Value Date/Time   CHOL 210 (H) 11/06/2022 1128   TRIG 177 (H) 11/06/2022 1128   HDL 39 (L) 11/06/2022 1128   CHOLHDL 5.4 (H) 11/06/2022 1128   LDLCALC 139 (H) 11/06/2022 1128    PHYSICAL EXAM:    VS:  BP 100/64 (BP Location: Left Arm, Patient Position: Sitting, Cuff Size: Normal)   Pulse (!) 111   Ht 5\' 9"  (1.753 m)   Wt 195 lb 3.2 oz (88.5 kg)   SpO2 97%   BMI 28.83 kg/m   BMI: Body mass index is 28.83 kg/m.  Physical Exam Vitals reviewed.  Constitutional:      Appearance: Calvin Esparza is well-developed.  HENT:     Head: Normocephalic and atraumatic.  Eyes:     General:        Right eye: No discharge.        Left eye: No discharge.  Neck:     Vascular: No JVD.  Cardiovascular:     Rate and Rhythm: Tachycardia present. Rhythm irregularly irregular.     Pulses:          Posterior tibial pulses are 2+ on the right side and 2+ on the left side.     Heart sounds: S1 normal and S2 normal. Heart sounds not distant. No midsystolic click and no opening snap. Murmur heard.     High-pitched blowing holosystolic murmur is present with a grade of 1/6 at the apex.      No friction rub.  Pulmonary:     Effort: Pulmonary effort is normal. No respiratory distress.     Breath sounds: Normal breath sounds. No decreased breath sounds, wheezing or rales.  Chest:     Chest wall: No tenderness.  Abdominal:     General: There is no distension.  Musculoskeletal:     Cervical back: Normal range of motion.     Right lower leg: No edema.     Left lower leg: No edema.     Comments: Lower extremity varicosities noted.  Skin:    General: Skin is warm and dry.     Nails: There is no clubbing.  Neurological:     Mental Status: Calvin Esparza is alert and oriented to person, place, and time.  Psychiatric:        Speech: Speech normal.        Behavior: Behavior normal.        Thought Content: Thought content normal.        Judgment: Judgment normal.     Wt Readings from Last 3 Encounters:  12/03/22 195 lb 3.2 oz (88.5 kg)  12/01/22 194 lb 12.8 oz (88.4 kg)  11/06/22 189 lb (85.7 kg)     ASSESSMENT & PLAN:   Biventricular failure with NICM: Appears euvolemic.  NYHA class III symptoms.  Cardiomyopathy exacerbated by medical nonadherence.  Relative hypotension, medical adherence, and follow-up have limited ability to escalate GDMT.  In an effort to improve ventricular rates with underlying permanent A-fib, in the context of relative hypotension, we will discontinue losartan and  start metoprolol succinate 12.5 mg daily.  Calvin Esparza will continue digoxin, Jardiance, spironolactone, and Lasix.  Check digoxin level and BMP today.  As able, escalate GDMT.  Will look to repeat a limited echo in several months time on maximally tolerated GDMT.  Anticipate referral to the advanced heart failure clinic at follow-up visit.  Permanent A-fib: Ventricular rate elevated at 111 in the office today.  Calvin Esparza reports ventricular rates in the 80s to 90s occasionally in the 1 teens at home.  In an effort to improve ventricular rates, we will discontinue losartan to allow for BP room to add metoprolol succinate  12.5 mg daily.  Continue digoxin 0.125 mg daily.  CHA2DS2-VASc at least 2 (CHF, vascular disease - aortic calcification noted on CTA chest in 2021).  Calvin Esparza remains on apixaban 5 mg twice daily and does not meet reduced dosing criteria.  Check digoxin level, BMP, and CBC.  Mitral regurgitation: Mild to moderate by echo during admission in 10/2022.  Monitor with periodic echo.  Polysubstance abuse: Denies any further cocaine or alcohol use.  Medical noncompliance: Reports adherence to medications since hospital discharge.  Abnormal chest CT: CT of the chest in 10/2022 showed a mildly enlarged right paratracheal lymph node only minimally increased in size when compared to study from 08/2019 and was favored to be benign and nonspecific.  Follow-up chest CT was recommended in 6 months time to ensure stability or progression of this finding.  This was not discussed with him in detail today.  Anticipate ordering follow-up chest CT at next visit, versus following up with PCP.   Disposition: F/u with Dr. Mariah Milling or an APP in 1 month.   Medication Adjustments/Labs and Tests Ordered: Current medicines are reviewed at length with the patient today.  Concerns regarding medicines are outlined above. Medication changes, Labs and Tests ordered today are summarized above and listed in the Patient Instructions accessible in Encounters.   Signed, Eula Listen, PA-C 12/03/2022 10:42 AM     [facility] [address] [place], [place] [address] [phone]

## 2022-12-01 ENCOUNTER — Encounter: Payer: Self-pay | Admitting: Family

## 2022-12-01 ENCOUNTER — Ambulatory Visit: Payer: 59 | Attending: Family | Admitting: Family

## 2022-12-01 VITALS — BP 103/86 | HR 82 | Wt 194.8 lb

## 2022-12-01 DIAGNOSIS — I428 Other cardiomyopathies: Secondary | ICD-10-CM | POA: Insufficient documentation

## 2022-12-01 DIAGNOSIS — Z86711 Personal history of pulmonary embolism: Secondary | ICD-10-CM | POA: Insufficient documentation

## 2022-12-01 DIAGNOSIS — I5020 Unspecified systolic (congestive) heart failure: Secondary | ICD-10-CM | POA: Diagnosis not present

## 2022-12-01 DIAGNOSIS — I5022 Chronic systolic (congestive) heart failure: Secondary | ICD-10-CM | POA: Insufficient documentation

## 2022-12-01 DIAGNOSIS — I110 Hypertensive heart disease with heart failure: Secondary | ICD-10-CM | POA: Insufficient documentation

## 2022-12-01 DIAGNOSIS — I4891 Unspecified atrial fibrillation: Secondary | ICD-10-CM | POA: Insufficient documentation

## 2022-12-01 DIAGNOSIS — I10 Essential (primary) hypertension: Secondary | ICD-10-CM | POA: Diagnosis not present

## 2022-12-01 DIAGNOSIS — F17220 Nicotine dependence, chewing tobacco, uncomplicated: Secondary | ICD-10-CM | POA: Insufficient documentation

## 2022-12-01 DIAGNOSIS — M109 Gout, unspecified: Secondary | ICD-10-CM | POA: Diagnosis not present

## 2022-12-01 DIAGNOSIS — Z7901 Long term (current) use of anticoagulants: Secondary | ICD-10-CM | POA: Diagnosis not present

## 2022-12-01 DIAGNOSIS — E785 Hyperlipidemia, unspecified: Secondary | ICD-10-CM | POA: Insufficient documentation

## 2022-12-01 DIAGNOSIS — I4819 Other persistent atrial fibrillation: Secondary | ICD-10-CM

## 2022-12-01 NOTE — Progress Notes (Signed)
PCP: Einar Crow, MD (last seen 08/24)) Primary Cardiologist: Eula Listen, PA (last seen 07/23; returns 09/24)  HPI:   Calvin Esparza is a 62 y/o male with a history of atrial fibrillation, HTN, hyperlipidemia, PE, alcohol use, tobacco use (chewing tobacco) and chronic heart failure.   Admitted 07/17/21 due to SOB due to HF exacerbation. Initially given IV lasix with transition to oral diuretics. Elevated troponin thought to be due to demand ischemia. Discharged after 4 days.   Admitted 10/24/22 due to worsening of fatigue, exertional dyspnea, and increasing leg swelling. Patient stopped taking all his medications January of this year due to insurance coverage issue. Chest x-ray showed cardiomegaly and pulmonary congestion, CT angiogram negative for PE. Cardiology consult obtained. Echo showed EF 20-25%, global hypokinesis, indeterminate diastolic parameters, RV fxn reduced, mild pulm HTN, RA and LA dilation, mild/mod Calvin. L and R Cardiac cath 8/12 without significant obstructive disease. Started on milrinone gtt due to hypotension after procedure and then weaned off. Meds re-introduced.   Echo 06/08/19: EF 35-40% along with mild LVH and mild LAE.  Echo 07/18/21: EF 40-45% along with mild LVH, mild Calvin and moderate LAE.  Echo 10/24/22: EF 20-25% along with mildly elevated PA pressure, severe LAE, mild AR and mild/ moderate Calvin  RHC/LHC 10/27/22: No angiographically significant coronary artery disease. Upper normal left and right heart filling pressures. Severely reduced Fick cardiac output/index.  Low PAPi suggests a component of right heart failure as well.  He presents today for a follow-up visit with a chief complaint of intermittent dizziness with sudden position changes. Denies any shortness of breath, fatigue, abdominal distention, pedal edema, cough, chest pain, palpitations, dizziness or difficulty sleeping. Does endorse a gradual weight gain because he admits that he's eating too much and too late  in the day and says that he's not getting any exercise.   Tried taking his furosemide PRN but says that he started to feel bloated so he resumed taking it every day.   ROS: All systems negative except as listed in HPI, PMH and Problem List.  SH:  Social History   Socioeconomic History   Marital status: Married    Spouse name: Not on file   Number of children: Not on file   Years of education: Not on file   Highest education level: Not on file  Occupational History   Not on file  Tobacco Use   Smoking status: Never   Smokeless tobacco: Current    Types: Chew  Vaping Use   Vaping status: Never Used  Substance and Sexual Activity   Alcohol use: Yes    Alcohol/week: 7.0 standard drinks of alcohol    Types: 7 Cans of beer per week    Comment: He states he hasn't drank in the past 2 weeks but has been a heavy drinker in the past   Drug use: Not Currently   Sexual activity: Not on file  Other Topics Concern   Not on file  Social History Narrative   Lives locally w/ wife.  Does not routinely exercise.  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   Social Determinants of Health   Financial Resource Strain: Not on file  Food Insecurity: No Food Insecurity (11/07/2022)   Hunger Vital Sign    Worried About Running Out of Food in the Last Year: Never true    Ran Out of Food in the Last Year: Never true  Transportation Needs: No Transportation Needs (11/07/2022)   PRAPARE - Transportation  Lack of Transportation (Medical): No    Lack of Transportation (Non-Medical): No  Physical Activity: Not on file  Stress: Not on file  Social Connections: Not on file  Intimate Partner Violence: Not At Risk (10/24/2022)   Humiliation, Afraid, Rape, and Kick questionnaire    Fear of Current or Ex-Partner: No    Emotionally Abused: No    Physically Abused: No    Sexually Abused: No    FH:  Family History  Problem Relation Age of Onset   Bone cancer Father    Stroke Father    Diabetes Sister      Past Medical History:  Diagnosis Date   Alcohol abuse    Chronic pain    COVID-19    a. 02/2019   HFrEF (heart failure with reduced ejection fraction) (HCC)    a. 05/2019 Echo: EF 35-40%; b. 07/2021 Echo: EF 40-45%; c. 10/2022 Echo: EF 20-25%, glob HK, mildly reduced RV fxn, sev dil LA, mid-mod Calvin, mild AI.   History of medication noncompliance    Longstanding persistent atrial fibrillation (HCC)    a. Dx 03/2018; b. 05/2019 recurrent Afib in setting of PE. CHA2DS2VASc = 1-->Eliquis later changed to xarelto; b. 07/2019 s/p DCCV (150J); d. 08/2019 admit w/ AF RVR in settting of noncompliance - longstanding persistent afib since on bb/ccb rx.   NICM (nonischemic cardiomyopathy) (HCC)    a. 05/2019 Echo: EF 35-40%; b. 05/2019 MV: EF 32%, no ischemia; c. 07/2021 Echo: EF 40-45%; d. 10/2022 Echo: EF 20-25%, glob HK.   Pulmonary embolism (HCC)    a. 05/2019 CTA Chest: mild amt of PE w/in a lower lobe branch of RPA; b. 08/2019 CTA Chest: Small PE in RML and RLL PA.    Current Outpatient Medications  Medication Sig Dispense Refill   acetaminophen (TYLENOL) 500 MG tablet Take 500 mg by mouth 2 (two) times daily as needed for moderate pain or mild pain.     apixaban (ELIQUIS) 5 MG TABS tablet Take 1 tablet (5 mg total) by mouth 2 (two) times daily. 180 tablet 0   colchicine 0.6 MG tablet Take 1 tablet (0.6 mg total) by mouth daily as needed. (Patient taking differently: Take 0.6 mg by mouth 2 (two) times daily.) 90 tablet 0   digoxin (LANOXIN) 0.125 MG tablet Take 1 tablet (0.125 mg total) by mouth daily. 90 tablet 0   empagliflozin (JARDIANCE) 10 MG TABS tablet Take 1 tablet (10 mg total) by mouth daily. 90 tablet 0   furosemide (LASIX) 20 MG tablet Take 1 tablet (20 mg total) by mouth as needed. 30 tablet 3   losartan (COZAAR) 25 MG tablet Take 0.5 tablets (12.5 mg total) by mouth daily. (Patient taking differently: Take 25 mg by mouth daily.) 90 tablet 0   spironolactone (ALDACTONE) 25 MG tablet Take 0.5  tablets (12.5 mg total) by mouth daily. 45 tablet 3   atorvastatin (LIPITOR) 40 MG tablet Take 1 tablet (40 mg total) by mouth daily. (Patient not taking: Reported on 12/01/2022) 90 tablet 3   No current facility-administered medications for this visit.   Vitals:   12/01/22 0949  BP: 103/86  Pulse: 82  SpO2: 98%  Weight: 194 lb 12.8 oz (88.4 kg)   Wt Readings from Last 3 Encounters:  12/01/22 194 lb 12.8 oz (88.4 kg)  11/06/22 189 lb (85.7 kg)  10/31/22 186 lb 14.4 oz (84.8 kg)   Lab Results  Component Value Date   CREATININE 0.93 11/17/2022   CREATININE 1.11  11/06/2022   CREATININE 1.03 10/31/2022   PHYSICAL EXAM:  General:  Well appearing. No resp difficulty HEENT: normal Neck: supple. JVP flat. No lymphadenopathy or thryomegaly appreciated. Cor: PMI normal. Normal rate irregular rhythm. No rubs, gallops or murmurs. Lungs: clear Abdomen: soft, nontender, nondistended. No hepatosplenomegaly. No bruits or masses.  Extremities: no cyanosis, clubbing, rash, edema Neuro: alert & oriented x 3, cranial nerves grossly intact. Moves all 4 extremities w/o difficulty. Affect pleasant.   ECG: 10/25/22 showed AF RVR with HR 116   ASSESSMENT & PLAN:  1: NICM with reduced ejection fraction- - suspect due to AF as cath showed nonobstructive CAD - NYHA class I - euvolemic today - weighing daily; reminded to call for an overnight weight gain of >2 pounds or a weekly weight gain of >5 pounds - weight up 5 pounds from last visit here 3 weeks ago - encouraged him to be mindful of portion sizes, time of day eating and increasing his exercise - Echo 06/08/19: EF 35-40% along with mild LVH and mild LAE.  - Echo 07/18/21: EF 40-45% along with mild LVH, mild Calvin and moderate LAE.  - Echo 10/24/22: EF 20-25% along with mildly elevated PA pressure, severe LAE, mild AR and mild/ moderate Calvin - RHC/LHC 10/27/22:   No angiographically significant coronary artery disease.   Upper normal left and right  heart filling pressures.   Severely reduced Fick cardiac output/index.  Low PAPi suggests a component of right heart failure as well. - not adding salt to his food and tries to be aware of his sodium intake - discussed the importance of keeping his daily fluid intake to ~ 64 ounces - continue digoxin 0.125mg  daily - dig level 10/31/22 was 0.5 - continue jardiance 10mg  daily - continue losartan 25mg  daily; current BP will not tolerate transition to entresto - continue spironolactone 25mg  daily - continue furosemide 20mg  daily - BNP 10/24/22 was 910.5  2: Atrial fibrillation/ PE- - saw cardiology ([facility]) 07/23 - continue apixaban 5mg  BID - continue digoxin 0.125mg  daily - he is unclear about whether he is taking metoprolol / diltiazem; have asked the pharmacy advocate to check about when these were last filled  3: HTN- - BP 103/86 - saw PCP Dareen Piano) 08/24 - BMP 10/31/22 reviewed and showed sodium 139, potassium 4.4, creatinine 1.03 and GFR >60  4: Gout- - now on allopurinol 300mg  daily per PCP  5: Hyperlipdemia- - continue atorvastatin 40mg  daily - LDL 11/06/22 was 139; has been out of his atorvastatin and just resumed it 11/07/22 - lipoprotein a 10/28/22 was 34.3  Return in 3 months, sooner if needed.

## 2022-12-01 NOTE — Patient Instructions (Addendum)
Great to see you today! Medication Changes:  None, continue current medications  Special Instructions // Education:  Do the following things EVERYDAY: Weigh yourself in the morning before breakfast. Write it down and keep it in a log. Take your medicines as prescribed Eat low salt foods--Limit salt (sodium) to 2000 mg per day.  Stay as active as you can everyday Limit all fluids for the day to less than 2 liters   Bring ALL medications including any over the counter or vitamins to every visit.    Follow-Up in: 3 months    If you have any questions or concerns before your next appointment please send [place] a message through [place] or call our office at [phone] Monday-Friday 8 am-5 pm.   If you have an urgent need after hours on the weekend please call your Primary Cardiologist or the [facility] in [place] at [phone].

## 2022-12-03 ENCOUNTER — Encounter: Payer: Self-pay | Admitting: Physician Assistant

## 2022-12-03 ENCOUNTER — Ambulatory Visit: Payer: 59 | Attending: Physician Assistant | Admitting: Physician Assistant

## 2022-12-03 ENCOUNTER — Other Ambulatory Visit
Admission: RE | Admit: 2022-12-03 | Discharge: 2022-12-03 | Disposition: A | Discharge status: Home / Self Care | Payer: 59 | Source: Ambulatory Visit | Attending: Physician Assistant | Admitting: Physician Assistant

## 2022-12-03 VITALS — BP 100/64 | HR 111 | Ht 69.0 in | Wt 195.2 lb

## 2022-12-03 DIAGNOSIS — I340 Nonrheumatic mitral (valve) insufficiency: Secondary | ICD-10-CM

## 2022-12-03 DIAGNOSIS — Z91199 Patient's noncompliance with other medical treatment and regimen due to unspecified reason: Secondary | ICD-10-CM | POA: Diagnosis not present

## 2022-12-03 DIAGNOSIS — R9389 Abnormal findings on diagnostic imaging of other specified body structures: Secondary | ICD-10-CM

## 2022-12-03 DIAGNOSIS — I5082 Biventricular heart failure: Secondary | ICD-10-CM

## 2022-12-03 DIAGNOSIS — I428 Other cardiomyopathies: Secondary | ICD-10-CM | POA: Diagnosis not present

## 2022-12-03 DIAGNOSIS — F1910 Other psychoactive substance abuse, uncomplicated: Secondary | ICD-10-CM

## 2022-12-03 DIAGNOSIS — I4821 Permanent atrial fibrillation: Secondary | ICD-10-CM | POA: Insufficient documentation

## 2022-12-03 LAB — BASIC METABOLIC PANEL WITH GFR
Anion gap: 10 (ref 5–15)
BUN: 17 mg/dL (ref 6–20)
CO2: 27 mmol/L (ref 22–32)
Calcium: 9.7 mg/dL (ref 8.9–10.3)
Chloride: 103 mmol/L (ref 98–111)
Creatinine, Ser: 1.01 mg/dL (ref 0.61–1.24)
GFR, Estimated: >60 mL/min (ref >60)
Glucose, Bld: 95 mg/dL (ref 70–99)
Potassium: 3.9 mmol/L (ref 3.5–5.1)
Sodium: 140 mmol/L (ref 135–145)

## 2022-12-03 LAB — CBC
HCT: 47.2 % (ref 39.0–52.0)
Hemoglobin: 15.5 g/dL (ref 13.0–17.0)
MCH: 29.2 pg (ref 26.0–34.0)
MCHC: 32.8 g/dL (ref 30.0–36.0)
MCV: 88.9 fL (ref 80.0–100.0)
Platelets: 176 10*3/uL (ref 150–400)
RBC: 5.31 MIL/uL (ref 4.22–5.81)
RDW: 17 % — ABNORMAL HIGH (ref 11.5–15.5)
WBC: 4.6 10*3/uL (ref 4.0–10.5)
nRBC: 0 % (ref 0.0–0.2)

## 2022-12-03 LAB — DIGOXIN LEVEL: Digoxin Level: 1 ng/mL (ref 0.8–2.0)

## 2022-12-03 MED ORDER — METOPROLOL SUCCINATE ER 25 MG PO TB24: 12.5000 mg | ORAL_TABLET | Freq: Every day | ORAL | 3 refills | Status: DC

## 2022-12-03 NOTE — Patient Instructions (Signed)
Medication Instructions:  Your physician recommends the following medication changes.  STOP TAKING: Losartan  START TAKING: Toprol XL 12.5 mg daily  *If you need a refill on your cardiac medications before your next appointment, please call your pharmacy*   Lab Work: Your provider would like for you to have following labs drawn today Digoxin Level, BMT, and CBC.   If you have labs (blood work) drawn today and your tests are completely normal, you will receive your results only by: MyChart Message (if you have MyChart) OR A paper copy in the mail If you have any lab test that is abnormal or we need to change your treatment, we will call you to review the results.  Follow-Up: At [facility], you and your health needs are our priority.  As part of our continuing mission to provide you with exceptional heart care, we have created designated Provider Care Teams.  These Care Teams include your primary Cardiologist (physician) and Advanced Practice Providers (APPs -  Physician Assistants and Nurse Practitioners) who all work together to provide you with the care you need, when you need it.  We recommend signing up for the patient portal called "MyChart".  Sign up information is provided on this After Visit Summary.  MyChart is used to connect with patients for Virtual Visits (Telemedicine).  Patients are able to view lab/test results, encounter notes, upcoming appointments, etc.  Non-urgent messages can be sent to your provider as well.   To learn more about what you can do with MyChart, go to [email or web address].    Your next appointment:   1 month(s)  Provider:   You may see Julien Nordmann, MD or one of the following Advanced Practice Providers on your designated Care Team:   Eula Listen, [place]

## 2022-12-08 ENCOUNTER — Other Ambulatory Visit: Payer: Self-pay | Admitting: Emergency Medicine

## 2022-12-08 DIAGNOSIS — Z79899 Other long term (current) drug therapy: Secondary | ICD-10-CM

## 2022-12-08 MED ORDER — DIGOXIN 62.5 MCG PO TABS: 0.0625 mg | ORAL_TABLET | Freq: Every day | ORAL | 0 refills | Status: DC

## 2022-12-18 ENCOUNTER — Other Ambulatory Visit: Payer: Self-pay

## 2022-12-18 DIAGNOSIS — Z79899 Other long term (current) drug therapy: Secondary | ICD-10-CM

## 2022-12-19 ENCOUNTER — Other Ambulatory Visit: Payer: Self-pay | Admitting: Emergency Medicine

## 2022-12-19 DIAGNOSIS — Z79899 Other long term (current) drug therapy: Secondary | ICD-10-CM

## 2022-12-19 LAB — DIGOXIN LEVEL: Digoxin, Serum: <0.4 ng/mL — ABNORMAL LOW (ref 0.5–0.9)

## 2022-12-19 MED ORDER — DIGOXIN 62.5 MCG PO TABS: ORAL_TABLET | ORAL | 3 refills | Status: DC

## 2022-12-26 ENCOUNTER — Other Ambulatory Visit: Payer: Self-pay | Admitting: Physician Assistant

## 2022-12-29 DIAGNOSIS — M1A09X1 Idiopathic chronic gout, multiple sites, with tophus (tophi): Secondary | ICD-10-CM | POA: Diagnosis not present

## 2023-01-02 ENCOUNTER — Ambulatory Visit: Payer: 59 | Admitting: Physician Assistant

## 2023-01-05 ENCOUNTER — Other Ambulatory Visit: Payer: Self-pay | Admitting: Student in an Organized Health Care Education/Training Program

## 2023-01-26 ENCOUNTER — Other Ambulatory Visit: Payer: Self-pay

## 2023-01-26 ENCOUNTER — Telehealth: Payer: Self-pay | Admitting: Family

## 2023-01-26 MED ORDER — EMPAGLIFLOZIN 10 MG PO TABS: 10.0000 mg | ORAL_TABLET | Freq: Every day | ORAL | 1 refills | Status: DC

## 2023-01-26 NOTE — Telephone Encounter (Signed)
Spoke with patient and verified pharmacy. Refill sent.

## 2023-01-27 ENCOUNTER — Ambulatory Visit: Payer: 59 | Attending: Physician Assistant | Admitting: Medical

## 2023-01-27 ENCOUNTER — Encounter: Payer: Self-pay | Admitting: Medical

## 2023-01-27 VITALS — BP 120/78 | HR 127 | Ht 70.0 in | Wt 202.8 lb

## 2023-01-27 DIAGNOSIS — Z91199 Patient's noncompliance with other medical treatment and regimen due to unspecified reason: Secondary | ICD-10-CM

## 2023-01-27 DIAGNOSIS — F1910 Other psychoactive substance abuse, uncomplicated: Secondary | ICD-10-CM

## 2023-01-27 DIAGNOSIS — I4821 Permanent atrial fibrillation: Secondary | ICD-10-CM

## 2023-01-27 DIAGNOSIS — Z79899 Other long term (current) drug therapy: Secondary | ICD-10-CM

## 2023-01-27 DIAGNOSIS — I340 Nonrheumatic mitral (valve) insufficiency: Secondary | ICD-10-CM

## 2023-01-27 DIAGNOSIS — I5082 Biventricular heart failure: Secondary | ICD-10-CM | POA: Diagnosis not present

## 2023-01-27 MED ORDER — SPIRONOLACTONE 25 MG PO TABS: 25.0000 mg | ORAL_TABLET | Freq: Every day | ORAL | 3 refills | Status: DC

## 2023-01-27 MED ORDER — METOPROLOL SUCCINATE ER 25 MG PO TB24: 25.0000 mg | ORAL_TABLET | Freq: Every day | ORAL | 3 refills | Status: DC

## 2023-01-27 NOTE — Patient Instructions (Signed)
Medication Instructions:  Your physician recommends the following medication changes.  INCREASE: Toprol 25 mg daily Spironolactone 25 mg daily   *If you need a refill on your cardiac medications before your next appointment, please call your pharmacy*   Lab Work: Your provider would like for you to have following labs drawn today Dig level, BNP, Lipid Panel, and direct LDL.    Your provider would like for you to return in 2 weeks to have the following labs drawn: BMT.   Please go to [facility] [address] ([facility] [address], [place] [address] You do not need an appointment.  They are open from 7:30 am-4 pm.  Lunch from 1:00 pm- 2:00 pm You DO NOT need to be fasting.  If you have labs (blood work) drawn today and your tests are completely normal, you will receive your results only by: MyChart Message (if you have MyChart) OR A paper copy in the mail If you have any lab test that is abnormal or we need to change your treatment, we will call you to review the results.   Please cancel appt with Clarisa Kindred and Schedule with either Dr Arvilla Meres or Marca Ancona   Follow-Up: At [facility], you and your health needs are our priority.  As part of our continuing mission to provide you with exceptional heart care, we have created designated Provider Care Teams.  These Care Teams include your primary Cardiologist (physician) and Advanced Practice Providers (APPs -  Physician Assistants and Nurse Practitioners) who all work together to provide you with the care you need, when you need it.  We recommend signing up for the patient portal called "MyChart".  Sign up information is provided on this After Visit Summary.  MyChart is used to connect with patients for Virtual Visits (Telemedicine).  Patients are able to view lab/test results, encounter notes, upcoming appointments, etc.  Non-urgent messages can be sent to your provider as well.   To learn  more about what you can do with MyChart, go to [email or web address].    Your next appointment:   2 month(s)  Provider:   You may see Julien Nordmann, MD or one of the following Advanced Practice Providers on your designated Care Team:   Nicolasa Ducking, NP Eula Listen, PA-C Cadence Fransico Michael, PA-C Charlsie Quest, NP Carlos Levering, NP

## 2023-01-27 NOTE — Progress Notes (Signed)
Cardiology Office Note:    Date:  01/27/2023   ID:  Calvin Esparza, DOB February 24, 1961, MRN [id number]  PCP:  Lauro Regulus, MD  [facility] Cardiologist:  Julien Nordmann, MD  [facility] Electrophysiologist:  None   Referring MD: Lauro Regulus, MD   Chief Complaint: 1 month follow-up  History of Present Illness:    Calvin Esparza is a 62 y.o. male with a hx of biventricular failure with nonischemic cardiomyopathy, permanent A-fib, PE in 05/2019 and again in 08/2019 in the setting of medical noncompliance, cocaine abuse, alcohol use, medical nonadherence, chronic pain, and gout who presents for 1 month follow-up.  He was diagnosed with A-fib in January 2020 when he presented with chest pain and dizziness.  He was placed on a beta-blocker and Xarelto, though was intermittently compliant with medical therapy.  In 05/2019 he presented to the ED with chest tightness and dyspnea and was found to be in A-fib with RVR.  CT of the chest showed mild amount of pulmonary embolism within the lower branch of the right pulmonary artery.  Echo showed EF of 35 to 40%, grade 1 diastolic dysfunction.  Stress test was nonischemic.  He was discharged on Eliquis with initial plan for rate control.  He was subsequently transition to Xarelto to improve medication compliance and underwent cardioversion in May 2021.  He is readmitted in June 2021 in the setting of dyspnea and recurrent A-fib in the context of medication noncompliance.  CT of the chest showed small pulmonary emboli in the right middle and lower lobe pulmonary arteries.  Recurrent PE was felt to be secondary to medication nonadherence.  He was readmitted in July 2021 with A-fib and presyncope in the setting of dehydration with AKI.  Patient was admitted in May 2023 with shortness of breath and found to be COVID-positive.  He was felt to be volume up and treated with IV Lasix.  UDS was positive for cocaine.  Echo showed EF of 40 to 45%.   Patient was treated medically.  Patient was readmitted in August 2024 with acute on chronic combined systolic and diastolic CHF with A-fib RVR in the setting of medication noncompliance.  BNP 910.  Echo showed EF of 20 to 25%, mildly reduced RV SF, mildly enlarged ventricular cavity size, mildly elevated RVSP, severe LAE, mild to moderate MR, mild aortic insufficiency, right atrial pressure 8 mmHg.  He was diuresed and underwent right left heart cath that showed no significant CAD with upper normal left and right heart filling pressures, severely reduced cardiac output and index.  And low PA PI suggestive of component of right heart failure.  In the setting, he was initiated on milrinone with improvement.  Patient was discharged on Eliquis, digoxin, Jardiance, Lasix 20 mg daily, losartan and spironolactone.  Patient was last seen December 03 2022 and was overall stable from a cardiac perspective.  In the context of relative hypotension losartan was discontinued and he was started on metoprolol 12.5 mg daily.  Today, the patient reports weight keeps going up since discharge in August. Appears weights have been as low as 186lbs and today is he 202lbs. He is not working and is at home all day. He is eating more and is still drinking 5-10 beers weekly. He is taking lasix daily. He denies significant edema, but feels stomach is bloated. He denies chest pain. He reports poor diet and no exercise. EKG shows Afib RVR, 127bpm. He denies heart racing or fluttering.  Past Medical History:  Diagnosis Date   Alcohol abuse    Chronic pain    COVID-19    a. 02/2019   HFrEF (heart failure with reduced ejection fraction) (HCC)    a. 05/2019 Echo: EF 35-40%; b. 07/2021 Echo: EF 40-45%; c. 10/2022 Echo: EF 20-25%, glob HK, mildly reduced RV fxn, sev dil LA, mid-mod MR, mild AI.   History of medication noncompliance    Longstanding persistent atrial fibrillation (HCC)    a. Dx 03/2018; b. 05/2019 recurrent Afib in setting  of PE. CHA2DS2VASc = 1-->Eliquis later changed to xarelto; b. 07/2019 s/p DCCV (150J); d. 08/2019 admit w/ AF RVR in settting of noncompliance - longstanding persistent afib since on bb/ccb rx.   NICM (nonischemic cardiomyopathy) (HCC)    a. 05/2019 Echo: EF 35-40%; b. 05/2019 MV: EF 32%, no ischemia; c. 07/2021 Echo: EF 40-45%; d. 10/2022 Echo: EF 20-25%, glob HK.   Pulmonary embolism (HCC)    a. 05/2019 CTA Chest: mild amt of PE w/in a lower lobe branch of RPA; b. 08/2019 CTA Chest: Small PE in RML and RLL PA.    Past Surgical History:  Procedure Laterality Date   CARDIOVERSION N/A 07/29/2019   Procedure: CARDIOVERSION;  Surgeon: Antonieta Iba, MD;  Location: [facility] ORS;  Service: Cardiovascular;  Laterality: N/A;   COLONOSCOPY WITH PROPOFOL N/A 05/24/2020   Procedure: COLONOSCOPY WITH PROPOFOL;  Surgeon: Toney Reil, MD;  Location: [facility] ENDOSCOPY;  Service: Gastroenterology;  Laterality: N/A;   ESOPHAGOGASTRODUODENOSCOPY (EGD) WITH PROPOFOL N/A 02/27/2020   Procedure: ESOPHAGOGASTRODUODENOSCOPY (EGD) WITH PROPOFOL;  Surgeon: Toney Reil, MD;  Location: [facility] ENDOSCOPY;  Service: Gastroenterology;  Laterality: N/A;   ESOPHAGOGASTRODUODENOSCOPY (EGD) WITH PROPOFOL N/A 05/24/2020   Procedure: ESOPHAGOGASTRODUODENOSCOPY (EGD) WITH PROPOFOL;  Surgeon: Toney Reil, MD;  Location: [facility] ENDOSCOPY;  Service: Gastroenterology;  Laterality: N/A;   RIGHT/LEFT HEART CATH AND CORONARY ANGIOGRAPHY N/A 10/27/2022   Procedure: RIGHT/LEFT HEART CATH AND CORONARY ANGIOGRAPHY;  Surgeon: Yvonne Kendall, MD;  Location: [facility] INVASIVE CV LAB;  Service: Cardiovascular;  Laterality: N/A;    Current Medications: Current Meds  Medication Sig   acetaminophen (TYLENOL) 500 MG tablet Take 500 mg by mouth 2 (two) times daily as needed for moderate pain or mild pain.   allopurinol (ZYLOPRIM) 300 MG tablet Take 300 mg by mouth daily.   apixaban (ELIQUIS) 5 MG TABS tablet Take 1 tablet (5 mg total) by mouth  2 (two) times daily.   atorvastatin (LIPITOR) 40 MG tablet Take 1 tablet (40 mg total) by mouth daily.   colchicine 0.6 MG tablet Take 1 tablet (0.6 mg total) by mouth daily as needed. (Patient taking differently: Take 0.6 mg by mouth 2 (two) times daily.)   Digoxin 62.5 MCG TABS Take 62.5 mcg one day and the next day take 125 mcg by mouth alternating daily.   empagliflozin (JARDIANCE) 10 MG TABS tablet Take 1 tablet (10 mg total) by mouth daily.   furosemide (LASIX) 20 MG tablet Take 1 tablet (20 mg total) by mouth as needed. (Patient taking differently: Take 20 mg by mouth daily.)   [DISCONTINUED] metoprolol succinate (TOPROL XL) 25 MG 24 hr tablet Take 0.5 tablets (12.5 mg total) by mouth daily.   [DISCONTINUED] spironolactone (ALDACTONE) 25 MG tablet Take 0.5 tablets (12.5 mg total) by mouth daily.     Allergies:   Patient has no known allergies.   Social History   Socioeconomic History   Marital status: Married    Spouse name: Not  on file   Number of children: Not on file   Years of education: Not on file   Highest education level: Not on file  Occupational History   Not on file  Tobacco Use   Smoking status: Never   Smokeless tobacco: Current    Types: Chew  Vaping Use   Vaping status: Never Used  Substance and Sexual Activity   Alcohol use: Yes    Alcohol/week: 7.0 standard drinks of alcohol    Types: 7 Cans of beer per week    Comment: He states he hasn't drank in the past 2 weeks but has been a heavy drinker in the past   Drug use: Not Currently   Sexual activity: Not on file  Other Topics Concern   Not on file  Social History Narrative   Lives locally w/ wife.  Does not routinely exercise.  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   Social Determinants of Health   Financial Resource Strain: Not on file  Food Insecurity: No Food Insecurity (11/07/2022)   Hunger Vital Sign    Worried About Running Out of Food in the Last Year: Never true    Ran Out of Food in the  Last Year: Never true  Transportation Needs: No Transportation Needs (11/07/2022)   PRAPARE - Administrator, Civil Service (Medical): No    Lack of Transportation (Non-Medical): No  Physical Activity: Not on file  Stress: Not on file  Social Connections: Not on file     Family History: The patient's family history includes Bone cancer in his father; Diabetes in his sister; Stroke in his father.  ROS:   Please see the history of present illness.     All other systems reviewed and are negative.  EKGs/Labs/Other Studies Reviewed:    The following studies were reviewed today:   2D echo 06/08/2019:  1. Left ventricular ejection fraction, by estimation, is 35 to 40%. The  left ventricle has moderately decreased function. The left ventricle  demonstrates global hypokinesis. There is mild left ventricular  hypertrophy. Left ventricular diastolic  parameters are consistent with Grade I diastolic dysfunction (impaired  relaxation).   2. Right ventricular systolic function is mildly reduced. The right  ventricular size is moderately enlarged.   3. Left atrial size was mildly dilated.   4. Right atrial size was mild to moderately dilated.   5. The mitral valve is grossly normal. No evidence of mitral valve  regurgitation.   6. The aortic valve is tricuspid. Aortic valve regurgitation is trivial.   7. The inferior vena cava is normal in size with greater than 50%  respiratory variability, suggesting right atrial pressure of 3 mmHg. __________   Eugenie Birks [facility] 06/10/2019: Pharmacological myocardial perfusion imaging study with no significant ischemia Moderate global hypokinesis, EF estimated at 32% No EKG changes concerning for ischemia at peak stress or in recovery. Baseline EKG with atrial fibrillation, rate 100 bpm Low risk scan ___________   2D echo 07/18/2021: 1. Left ventricular ejection fraction, by estimation, is 40 to 45%. The  left ventricle has mildly decreased  function. The left ventricle  demonstrates global hypokinesis. There is mild left ventricular  hypertrophy. Left ventricular diastolic parameters  are indeterminate.   2. Right ventricular systolic function is normal. The right ventricular  size is normal. Tricuspid regurgitation signal is inadequate for assessing  PA pressure.   3. Left atrial size was moderately dilated.   4. The mitral valve is normal in structure. Mild  mitral valve  regurgitation. No evidence of mitral stenosis.   5. The aortic valve is normal in structure. Aortic valve regurgitation is  mild. No aortic stenosis is present.   6. The inferior vena cava is normal in size with greater than 50%  respiratory variability, suggesting right atrial pressure of 3 mmHg. __________   2D echo 10/24/2022: 1. Left ventricular ejection fraction, by estimation, is 20 to 25%. Left  ventricular ejection fraction by PLAX is 23 %. The left ventricle has  severely decreased function. The left ventricle demonstrates global  hypokinesis. Left ventricular diastolic  parameters are indeterminate.   2. Right ventricular systolic function is mildly reduced. The right  ventricular size is mildly enlarged. There is mildly elevated pulmonary  artery systolic pressure.   3. Left atrial size was severely dilated.   4. Right atrial size was mildly dilated.   5. The mitral valve is normal in structure. Mild to moderate mitral valve  regurgitation.   6. The aortic valve is tricuspid. Aortic valve regurgitation is mild.   7. The inferior vena cava is dilated in size with >50% respiratory  variability, suggesting right atrial pressure of 8 mmHg.  __________   [facility] 10/27/2022: Conclusions: No angiographically significant coronary artery disease. Upper normal left and right heart filling pressures. Severely reduced Fick cardiac output/index.  Low PAPi suggests a component of right heart failure as well.   Recommendations: Transfer to stepdown and  initiate milrinone therapy. Decrease metoprolol succinate in the setting of low-output heart failure. Maintain net even fluid balance. If cardiac output cannot be improved and/or milrinone cannot be weaned off, will need to consider transfer to [facility] for further management by the advanced heart failure team.      EKG:  EKG is ordered today.  The ekg ordered today demonstrates Afib 127bpm, LAD, nonspecific T wave changes  Recent Labs: 10/24/2022: B Natriuretic Peptide 910.5 10/31/2022: Magnesium 2.5 12/03/2022: BUN 17; Creatinine, Ser 1.01; Hemoglobin 15.5; Platelets 176; Potassium 3.9; Sodium 140  Recent Lipid Panel    Component Value Date/Time   CHOL 210 (H) 11/06/2022 1128   TRIG 177 (H) 11/06/2022 1128   HDL 39 (L) 11/06/2022 1128   CHOLHDL 5.4 (H) 11/06/2022 1128   LDLCALC 139 (H) 11/06/2022 1128    Physical Exam:    VS:  BP 120/78 (BP Location: Left Arm, Patient Position: Sitting, Cuff Size: Normal)   Pulse (!) 127   Ht 5\' 10"  (1.778 m)   Wt 202 lb 12.8 oz (92 kg)   SpO2 97%   BMI 29.10 kg/m     Wt Readings from Last 3 Encounters:  01/27/23 202 lb 12.8 oz (92 kg)  12/03/22 195 lb 3.2 oz (88.5 kg)  12/01/22 194 lb 12.8 oz (88.4 kg)     GEN:  Well nourished, well developed in no acute distress HEENT: Normal NECK: No JVD; No carotid bruits LYMPHATICS: No lymphadenopathy CARDIAC: Irreg Irreg, no murmurs, rubs, gallops RESPIRATORY:  Clear to auscultation without rales, wheezing or rhonchi  ABDOMEN: Soft, non-tender, non-distended MUSCULOSKELETAL:  trivial lower leg edema; No deformity  SKIN: Warm and dry NEUROLOGIC:  Alert and oriented x 3 PSYCHIATRIC:  Normal affect   ASSESSMENT:    1. Biventricular failure (HCC)   2. Permanent atrial fibrillation (HCC)   3. Medication management   4. Mitral valve insufficiency, unspecified etiology   5. Polysubstance abuse (HCC)   6. History of nonadherence to medical treatment    PLAN:    In order  of problems listed  above:  Biventricular failure with NICM The patient reports weight gain since discharge in August 2024 (194lbs>>202lbs). Suspect this may be from increase caloric intake and possibly extra fluid. He takes lasix 20mg  daily and does not appear significantly volume up on exam. I will check a BNP today. Continue lasix dose. I will increase spironolactone to 25mg  daily. Continue Toprol and Jardiance. I will have him see ACHF team. BMET in 2 weeks. He will eventually need limited echo once GDMT has been optimized.   Permanent Afib EKG Today shows Afib with heart rate of 127bpm. He is overall asymptomatic. I will increase Toprol to 25 mg daily. He is on digoxin. Last dig level was low and digoxin was changed to 0.0625mg  daily alternating with 0.125mg  every other day. I will check a dig level today. Continue Eliquis 5mg  BID for stroke ppx.    Mitral regurgitation Echo in 10/2022 showed mild to moderate MR.   Polysubstance abuse He denies recent cocaine use. He is still drinking 5-10 beers weekly.   Medical noncompliance Patient reports compliance to medication since hospital discharge.   Disposition: Follow up in 2 month(s) with MD/APP    Signed, Dawanda Mapel David Stall, PA-C  01/27/2023 12:29 PM    [facility]

## 2023-01-28 ENCOUNTER — Telehealth: Payer: Self-pay | Admitting: Cardiovascular Disease

## 2023-01-28 LAB — LIPID PANEL
Chol/HDL Ratio: 3.7 ratio (ref 0.0–5.0)
Cholesterol, Total: 187 mg/dL (ref 100–199)
HDL: 50 mg/dL (ref >39)
LDL Chol Calc (NIH): 114 mg/dL — ABNORMAL HIGH (ref 0–99)
Triglycerides: 132 mg/dL (ref 0–149)
VLDL Cholesterol Cal: 23 mg/dL (ref 5–40)

## 2023-01-28 LAB — LDL CHOLESTEROL, DIRECT: LDL Direct: 118 mg/dL — ABNORMAL HIGH (ref 0–99)

## 2023-01-28 LAB — DIGOXIN LEVEL: Digoxin, Serum: 0.5 ng/mL (ref 0.5–0.9)

## 2023-01-28 LAB — BRAIN NATRIURETIC PEPTIDE: BNP: 163 pg/mL — ABNORMAL HIGH (ref 0.0–100.0)

## 2023-01-28 NOTE — Telephone Encounter (Signed)
The patient has been notified of the result along with recommendations. Pt verbalized understanding. All questions (if any) were answered    Furth, Cadence H, PA-C  Parke Poisson, RN BNP a little high, spiro was increased as the visit.  Cholesterol is OK.  Dig at level-continue current dose

## 2023-01-28 NOTE — Telephone Encounter (Signed)
Pt returning nurses phone call. Please advise ?

## 2023-02-18 ENCOUNTER — Telehealth: Payer: Self-pay | Admitting: Internal Medicine

## 2023-02-18 NOTE — Telephone Encounter (Signed)
Pt confirm appt for 02/19/23

## 2023-02-19 ENCOUNTER — Other Ambulatory Visit: Payer: Self-pay | Admitting: *Deleted

## 2023-02-19 ENCOUNTER — Ambulatory Visit: Payer: 59 | Attending: Internal Medicine | Admitting: Internal Medicine

## 2023-02-19 VITALS — BP 118/82 | HR 118 | Wt 205.1 lb

## 2023-02-19 DIAGNOSIS — I5022 Chronic systolic (congestive) heart failure: Secondary | ICD-10-CM | POA: Diagnosis not present

## 2023-02-19 DIAGNOSIS — Z86711 Personal history of pulmonary embolism: Secondary | ICD-10-CM | POA: Diagnosis not present

## 2023-02-19 DIAGNOSIS — I5020 Unspecified systolic (congestive) heart failure: Secondary | ICD-10-CM | POA: Diagnosis not present

## 2023-02-19 DIAGNOSIS — I4821 Permanent atrial fibrillation: Secondary | ICD-10-CM

## 2023-02-19 DIAGNOSIS — F1910 Other psychoactive substance abuse, uncomplicated: Secondary | ICD-10-CM | POA: Diagnosis not present

## 2023-02-19 DIAGNOSIS — I5082 Biventricular heart failure: Secondary | ICD-10-CM | POA: Diagnosis not present

## 2023-02-19 DIAGNOSIS — Z7984 Long term (current) use of oral hypoglycemic drugs: Secondary | ICD-10-CM | POA: Diagnosis not present

## 2023-02-19 DIAGNOSIS — I4819 Other persistent atrial fibrillation: Secondary | ICD-10-CM

## 2023-02-19 DIAGNOSIS — Z79899 Other long term (current) drug therapy: Secondary | ICD-10-CM | POA: Insufficient documentation

## 2023-02-19 DIAGNOSIS — I340 Nonrheumatic mitral (valve) insufficiency: Secondary | ICD-10-CM | POA: Insufficient documentation

## 2023-02-19 DIAGNOSIS — I428 Other cardiomyopathies: Secondary | ICD-10-CM | POA: Diagnosis not present

## 2023-02-19 DIAGNOSIS — Z7901 Long term (current) use of anticoagulants: Secondary | ICD-10-CM | POA: Diagnosis not present

## 2023-02-19 MED ORDER — AMIODARONE HCL 200 MG PO TABS: 400.0000 mg | ORAL_TABLET | Freq: Two times a day (BID) | ORAL | 1 refills | Status: DC

## 2023-02-19 MED ORDER — FUROSEMIDE 40 MG PO TABS: 40.0000 mg | ORAL_TABLET | Freq: Every day | ORAL | 3 refills | Status: DC

## 2023-02-19 NOTE — Patient Instructions (Signed)
Medication Changes:  START Amiodarone 400 mg (2 tabs) Twice daily   Increase Furosemide to 40 mg Daily  Lab Work:  Labs done today, your results will be available in MyChart, we will contact you for abnormal readings.  Testing/Procedures:  Your physician has recommended that you have a Cardioversion (DCCV). Electrical Cardioversion uses a jolt of electricity to your heart either through paddles or wired patches attached to your chest. This is a controlled, usually prescheduled, procedure. Defibrillation is done under light anesthesia in the hospital, and you usually go home the day of the procedure. This is done to get your heart back into a normal rhythm. You are not awake for the procedure. Please see the instructions below.  Referrals:  none  Special Instructions // Education:  Do the following things EVERYDAY: Weigh yourself in the morning before breakfast. Write it down and keep it in a log. Take your medicines as prescribed Eat low salt foods--Limit salt (sodium) to 2000 mg per day.  Stay as active as you can everyday Limit all fluids for the day to less than 2 liters     CARDIOVERSION INSTRUCTIONS: You are scheduled for a Cardioversion on Tuesday, December 17 with Dr. Gala Romney.    Please arrive at the [facility] (Main Entrance A) at [facility]: [address] [place], [place] [address] at 9:00 AM (This time is 1 hour(s) before your procedure to ensure your preparation).   Free valet parking service is available. You will check in at ADMITTING.   *Please Note: You will receive a call the day before your procedure to confirm the appointment time. That time may have changed from the original time based on the schedule for that day.*   DIET:  Nothing to eat or drink after midnight except a sip of water with medications (see medication instructions below)  MEDICATION INSTRUCTIONS:              HOLD: Empagliflozin (Jardiance) for 3 days prior to the procedure.  Last dose on Saturday, December 14.   Continue taking your anticoagulant (blood thinner): Apixaban (Eliquis).    LABS: Done today  FYI:  Your support person will be asked to wait in the waiting room during your procedure.  It is OK to have someone drop you off and come back when you are ready to be discharged.  You cannot drive after the procedure and will need someone to drive you home.  Bring your insurance cards.  *Special Note: Every effort is made to have your procedure done on time. Occasionally there are emergencies that occur at the hospital that may cause delays. Please be patient if a delay does occur.      Follow-Up in: 2 months    If you have any questions or concerns before your next appointment please send [place] a message through [place] or call our office at [phone] Monday-Friday 8 am-5 pm.   If you have an urgent need after hours on the weekend please call your Primary Cardiologist or the Advanced Heart Failure Clinic in [place] at [phone].   At the Advanced Heart Failure Clinic, you and your health needs are our priority. We have a designated team specialized in the treatment of Heart Failure. This Care Team includes your primary Heart Failure Specialized Cardiologist (physician), Advanced Practice Providers (APPs- Physician Assistants and Nurse Practitioners), and Pharmacist who all work together to provide you with the care you need, when you need it.   You may see any of  the following providers on your designated Care Team at your next follow up:  Dr. Arvilla Meres Dr. Marca Ancona Dr. Dorthula Nettles Dr. Theresia Bough Tonye Becket, NP Robbie Lis, [place] [address] [place], [place] Brynda Peon, NP [place] Lee, NP Clarisa Kindred, NP Enos Fling, PharmD

## 2023-02-19 NOTE — H&P (View-Only) (Signed)
 ADVANCED HF CLINIC CONSULT NOTE  Referring Physician: Lauro Regulus, MD Primary Care: Lauro Regulus, MD Primary Cardiologist: Julien Nordmann, MD  HPI:  Calvin Esparza is a 62 y.o. male with a hx of biventricular systolic heart failure with nonischemic cardiomyopathy, permanent A-fib, PE in 05/2019, 08/2019 in the setting of medical noncompliance, polysubstance abuse (cocaine, alcohol), chronic pain who is referred by Dr. Mariah Milling for further evaluation of his HF.   He was diagnosed with A-fib in January 2020 when he presented with chest pain and dizziness.  He was placed on a beta-blocker and Xarelto, though was intermittently compliant with medical therapy.  In 05/2019 he presented to the ED with chest tightness and dyspnea and was found to be in A-fib with RVR.  CT of the chest showed mild amount of pulmonary embolism within the lower branch of the right pulmonary artery. Echo EF of 35 to 40%, grade 1 diastolic dysfunction.  Stress test was nonischemic.  He was discharged on Eliquis with initial plan for rate control.  He was subsequently transition to Xarelto to improve medication compliance and underwent cardioversion in May 2021.  Readmitted June 2021 in the setting of dyspnea and recurrent A-fib in the context of medication noncompliance.  CT of the chest showed small pulmonary emboli in the right middle and lower lobe pulmonary arteries.   He was readmitted in July 2021 with A-fib and presyncope in the setting of dehydration with AKI.  Patient was admitted in May 2023 with shortness of breath and found to be COVID-positive. He was in AF with RVR. He was felt to be volume up and treated with IV Lasix.  UDS was positive for cocaine.  Echo EF 40-45%.   Patient was readmitted in August 2024 with acute on chronic combined systolic and diastolic CHF with A-fib RVR in the setting of medication noncompliance.  BNP 910.  Echo EF 20-25%, mildly reduced RV, mild to moderate MR, mild aortic  insufficiency. He was diuresed and underwent had R/L cath with no significant CAD. RA 8 PA 23/16 (18) PCWP 16 Fick 2.7/1.4 PAPi 0.9  He had  was initiated on milrinone with improvement.  Patient was discharged on Eliquis, digoxin, Jardiance, Lasix 20 mg daily, losartan and spironolactone.  Patient was last seen December 03 2022 and was overall stable from a cardiac perspective.  In the context of relative hypotension losartan was discontinued and he was started on metoprolol 12.5 mg daily.  Was seen by Cadence Furth PA-C on 01/27/23. Was in AF with RVR.  Toprol increased to 25 mg  Says he feel OK. Feels tired easily. Occasional SOB. Has been unable to work.Denies palpitations. Denies LE edema. No CP. Feels bloated in the belly. Drinks a lot fluid. Drinks 4-5 beers on Saturdays otherwise has cut back. Denies cocaine.     Past Medical History:  Diagnosis Date   Alcohol abuse    Chronic pain    COVID-19    a. 02/2019   HFrEF (heart failure with reduced ejection fraction) (HCC)    a. 05/2019 Echo: EF 35-40%; b. 07/2021 Echo: EF 40-45%; c. 10/2022 Echo: EF 20-25%, glob HK, mildly reduced RV fxn, sev dil LA, mid-mod MR, mild AI.   History of medication noncompliance    Longstanding persistent atrial fibrillation (HCC)    a. Dx 03/2018; b. 05/2019 recurrent Afib in setting of PE. CHA2DS2VASc = 1-->Eliquis later changed to xarelto; b. 07/2019 s/p DCCV (150J); d. 08/2019 admit w/ AF RVR in settting  of noncompliance - longstanding persistent afib since on bb/ccb rx.   NICM (nonischemic cardiomyopathy) (HCC)    a. 05/2019 Echo: EF 35-40%; b. 05/2019 MV: EF 32%, no ischemia; c. 07/2021 Echo: EF 40-45%; d. 10/2022 Echo: EF 20-25%, glob HK.   Pulmonary embolism (HCC)    a. 05/2019 CTA Chest: mild amt of PE w/in a lower lobe branch of RPA; b. 08/2019 CTA Chest: Small PE in RML and RLL PA.    Current Outpatient Medications  Medication Sig Dispense Refill   acetaminophen (TYLENOL) 500 MG tablet Take 500 mg by mouth 2  (two) times daily as needed for moderate pain or mild pain.     allopurinol (ZYLOPRIM) 300 MG tablet Take 300 mg by mouth daily.     apixaban (ELIQUIS) 5 MG TABS tablet Take 1 tablet (5 mg total) by mouth 2 (two) times daily. 180 tablet 0   atorvastatin (LIPITOR) 40 MG tablet Take 1 tablet (40 mg total) by mouth daily. 90 tablet 3   colchicine 0.6 MG tablet Take 1 tablet (0.6 mg total) by mouth daily as needed. (Patient taking differently: Take 0.6 mg by mouth 2 (two) times daily.) 90 tablet 0   Digoxin 62.5 MCG TABS Take 62.5 mcg one day and the next day take 125 mcg by mouth alternating daily. 145 tablet 3   empagliflozin (JARDIANCE) 10 MG TABS tablet Take 1 tablet (10 mg total) by mouth daily. 90 tablet 1   furosemide (LASIX) 20 MG tablet Take 20 mg by mouth daily.     metoprolol succinate (TOPROL XL) 25 MG 24 hr tablet Take 1 tablet (25 mg total) by mouth daily. 45 tablet 3   spironolactone (ALDACTONE) 25 MG tablet Take 1 tablet (25 mg total) by mouth daily. 45 tablet 3   No current facility-administered medications for this visit.    No Known Allergies    Social History   Socioeconomic History   Marital status: Married    Spouse name: Not on file   Number of children: Not on file   Years of education: Not on file   Highest education level: Not on file  Occupational History   Not on file  Tobacco Use   Smoking status: Never   Smokeless tobacco: Current    Types: Chew  Vaping Use   Vaping status: Never Used  Substance and Sexual Activity   Alcohol use: Yes    Alcohol/week: 7.0 standard drinks of alcohol    Types: 7 Cans of beer per week    Comment: He states he hasn't drank in the past 2 weeks but has been a heavy drinker in the past   Drug use: Not Currently   Sexual activity: Not on file  Other Topics Concern   Not on file  Social History Narrative   Lives locally w/ wife.  Does not routinely exercise.  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   Social Determinants of  Health   Financial Resource Strain: Not on file  Food Insecurity: No Food Insecurity (11/07/2022)   Hunger Vital Sign    Worried About Running Out of Food in the Last Year: Never true    Ran Out of Food in the Last Year: Never true  Transportation Needs: No Transportation Needs (11/07/2022)   PRAPARE - Administrator, Civil Service (Medical): No    Lack of Transportation (Non-Medical): No  Physical Activity: Not on file  Stress: Not on file  Social Connections: Not on file  Intimate  Partner Violence: Not At Risk (10/24/2022)   Humiliation, Afraid, Rape, and Kick questionnaire    Fear of Current or Ex-Partner: No    Emotionally Abused: No    Physically Abused: No    Sexually Abused: No      Family History  Problem Relation Age of Onset   Bone cancer Father    Stroke Father    Diabetes Sister     Vitals:   02/19/23 1305  BP: 118/82  Pulse: (!) 118  SpO2: 98%  Weight: 205 lb 2 oz (93 kg)    PHYSICAL EXAM: General: Sitting on exam table No respiratory difficulty HEENT: normal Neck: supple. JVP 8. Carotids 2+ bilat; no bruits. No lymphadenopathy or thryomegaly appreciated. Cor: Irregular tachy.  Lungs: clear Abdomen: soft, nontender, nondistended. No hepatosplenomegaly. No bruits or masses. Good bowel sounds. Extremities: no cyanosis, clubbing, rash, edema Neuro: alert & oriented x 3, cranial nerves grossly intact. moves all 4 extremities w/o difficulty. Affect pleasant.  ECG: AF 148 Narrow qrs Personally reviewed    ASSESSMENT & PLAN:  1. Chronic systolic with biventricular failure with NICM - Echo 5/23 EF 40-45% - Echo 8/24 EF 20-25%, mildly reduced RV, mild to moderate MR, mild aortic insufficiency. - R/L cath with no significant CAD. 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  - Continue Toprol 25 - Continue  Jardiance 10. - Off losartan due to low BP   2. Permanent Afib with RVR - has longstanding AF but has failed rate control. HR markedly elevated today but asymptomatic - needs an attempt at rhythm control - start amio 400 bid with plan for DC-CV in 2 weeks - continue Eliquis - refer to EP for possible ablation - needs sleep study - discussed need to refrain from ETOH  3. Mitral regurgitation - Echo in 10/2022 showed mild to moderate MR.    4. Polysubstance abuse - He denies recent cocaine use. He is still drinking 5-10 beers weekly.  - Counseled on need to cut back   5. H/o PE - continue Eliquis - no evidence of PAH on RHC   6. Medical noncompliance - seems to have improved  Arvilla Meres, MD  1:53 PM

## 2023-02-19 NOTE — Progress Notes (Signed)
ADVANCED HF CLINIC CONSULT NOTE  Referring Physician: Lauro Regulus, MD Primary Care: Lauro Regulus, MD Primary Cardiologist: Julien Nordmann, MD  HPI:  Calvin Esparza is a 62 y.o. male with a hx of biventricular systolic heart failure with nonischemic cardiomyopathy, permanent A-fib, PE in 05/2019, 08/2019 in the setting of medical noncompliance, polysubstance abuse (cocaine, alcohol), chronic pain who is referred by Dr. Mariah Milling for further evaluation of his HF.   He was diagnosed with A-fib in January 2020 when he presented with chest pain and dizziness.  He was placed on a beta-blocker and Xarelto, though was intermittently compliant with medical therapy.  In 05/2019 he presented to the ED with chest tightness and dyspnea and was found to be in A-fib with RVR.  CT of the chest showed mild amount of pulmonary embolism within the lower branch of the right pulmonary artery. Echo EF of 35 to 40%, grade 1 diastolic dysfunction.  Stress test was nonischemic.  He was discharged on Eliquis with initial plan for rate control.  He was subsequently transition to Xarelto to improve medication compliance and underwent cardioversion in May 2021.  Readmitted June 2021 in the setting of dyspnea and recurrent A-fib in the context of medication noncompliance.  CT of the chest showed small pulmonary emboli in the right middle and lower lobe pulmonary arteries.   He was readmitted in July 2021 with A-fib and presyncope in the setting of dehydration with AKI.  Patient was admitted in May 2023 with shortness of breath and found to be COVID-positive. He was in AF with RVR. He was felt to be volume up and treated with IV Lasix.  UDS was positive for cocaine.  Echo EF 40-45%.   Patient was readmitted in August 2024 with acute on chronic combined systolic and diastolic CHF with A-fib RVR in the setting of medication noncompliance.  BNP 910.  Echo EF 20-25%, mildly reduced RV, mild to moderate MR, mild aortic  insufficiency. He was diuresed and underwent had R/L cath with no significant CAD. RA 8 PA 23/16 (18) PCWP 16 Fick 2.7/1.4 PAPi 0.9  He had  was initiated on milrinone with improvement.  Patient was discharged on Eliquis, digoxin, Jardiance, Lasix 20 mg daily, losartan and spironolactone.  Patient was last seen December 03 2022 and was overall stable from a cardiac perspective.  In the context of relative hypotension losartan was discontinued and he was started on metoprolol 12.5 mg daily.  Was seen by Cadence Furth PA-C on 01/27/23. Was in AF with RVR.  Toprol increased to 25 mg  Says he feel OK. Feels tired easily. Occasional SOB. Has been unable to work.Denies palpitations. Denies LE edema. No CP. Feels bloated in the belly. Drinks a lot fluid. Drinks 4-5 beers on Saturdays otherwise has cut back. Denies cocaine.     Past Medical History:  Diagnosis Date   Alcohol abuse    Chronic pain    COVID-19    a. 02/2019   HFrEF (heart failure with reduced ejection fraction) (HCC)    a. 05/2019 Echo: EF 35-40%; b. 07/2021 Echo: EF 40-45%; c. 10/2022 Echo: EF 20-25%, glob HK, mildly reduced RV fxn, sev dil LA, mid-mod MR, mild AI.   History of medication noncompliance    Longstanding persistent atrial fibrillation (HCC)    a. Dx 03/2018; b. 05/2019 recurrent Afib in setting of PE. CHA2DS2VASc = 1-->Eliquis later changed to xarelto; b. 07/2019 s/p DCCV (150J); d. 08/2019 admit w/ AF RVR in settting  of noncompliance - longstanding persistent afib since on bb/ccb rx.   NICM (nonischemic cardiomyopathy) (HCC)    a. 05/2019 Echo: EF 35-40%; b. 05/2019 MV: EF 32%, no ischemia; c. 07/2021 Echo: EF 40-45%; d. 10/2022 Echo: EF 20-25%, glob HK.   Pulmonary embolism (HCC)    a. 05/2019 CTA Chest: mild amt of PE w/in a lower lobe branch of RPA; b. 08/2019 CTA Chest: Small PE in RML and RLL PA.    Current Outpatient Medications  Medication Sig Dispense Refill   acetaminophen (TYLENOL) 500 MG tablet Take 500 mg by mouth 2  (two) times daily as needed for moderate pain or mild pain.     allopurinol (ZYLOPRIM) 300 MG tablet Take 300 mg by mouth daily.     apixaban (ELIQUIS) 5 MG TABS tablet Take 1 tablet (5 mg total) by mouth 2 (two) times daily. 180 tablet 0   atorvastatin (LIPITOR) 40 MG tablet Take 1 tablet (40 mg total) by mouth daily. 90 tablet 3   colchicine 0.6 MG tablet Take 1 tablet (0.6 mg total) by mouth daily as needed. (Patient taking differently: Take 0.6 mg by mouth 2 (two) times daily.) 90 tablet 0   Digoxin 62.5 MCG TABS Take 62.5 mcg one day and the next day take 125 mcg by mouth alternating daily. 145 tablet 3   empagliflozin (JARDIANCE) 10 MG TABS tablet Take 1 tablet (10 mg total) by mouth daily. 90 tablet 1   furosemide (LASIX) 20 MG tablet Take 20 mg by mouth daily.     metoprolol succinate (TOPROL XL) 25 MG 24 hr tablet Take 1 tablet (25 mg total) by mouth daily. 45 tablet 3   spironolactone (ALDACTONE) 25 MG tablet Take 1 tablet (25 mg total) by mouth daily. 45 tablet 3   No current facility-administered medications for this visit.    No Known Allergies    Social History   Socioeconomic History   Marital status: Married    Spouse name: Not on file   Number of children: Not on file   Years of education: Not on file   Highest education level: Not on file  Occupational History   Not on file  Tobacco Use   Smoking status: Never   Smokeless tobacco: Current    Types: Chew  Vaping Use   Vaping status: Never Used  Substance and Sexual Activity   Alcohol use: Yes    Alcohol/week: 7.0 standard drinks of alcohol    Types: 7 Cans of beer per week    Comment: He states he hasn't drank in the past 2 weeks but has been a heavy drinker in the past   Drug use: Not Currently   Sexual activity: Not on file  Other Topics Concern   Not on file  Social History Narrative   Lives locally w/ wife.  Does not routinely exercise.  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   Social Determinants of  Health   Financial Resource Strain: Not on file  Food Insecurity: No Food Insecurity (11/07/2022)   Hunger Vital Sign    Worried About Running Out of Food in the Last Year: Never true    Ran Out of Food in the Last Year: Never true  Transportation Needs: No Transportation Needs (11/07/2022)   PRAPARE - Administrator, Civil Service (Medical): No    Lack of Transportation (Non-Medical): No  Physical Activity: Not on file  Stress: Not on file  Social Connections: Not on file  Intimate  Partner Violence: Not At Risk (10/24/2022)   Humiliation, Afraid, Rape, and Kick questionnaire    Fear of Current or Ex-Partner: No    Emotionally Abused: No    Physically Abused: No    Sexually Abused: No      Family History  Problem Relation Age of Onset   Bone cancer Father    Stroke Father    Diabetes Sister     Vitals:   02/19/23 1305  BP: 118/82  Pulse: (!) 118  SpO2: 98%  Weight: 205 lb 2 oz (93 kg)    PHYSICAL EXAM: General: Sitting on exam table No respiratory difficulty HEENT: normal Neck: supple. JVP 8. Carotids 2+ bilat; no bruits. No lymphadenopathy or thryomegaly appreciated. Cor: Irregular tachy.  Lungs: clear Abdomen: soft, nontender, nondistended. No hepatosplenomegaly. No bruits or masses. Good bowel sounds. Extremities: no cyanosis, clubbing, rash, edema Neuro: alert & oriented x 3, cranial nerves grossly intact. moves all 4 extremities w/o difficulty. Affect pleasant.  ECG: AF 148 Narrow qrs Personally reviewed    ASSESSMENT & PLAN:  1. Chronic systolic with biventricular failure with NICM - Echo 5/23 EF 40-45% - Echo 8/24 EF 20-25%, mildly reduced RV, mild to moderate MR, mild aortic insufficiency. - R/L cath with no significant CAD. 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  - Continue Toprol 25 - Continue  Jardiance 10. - Off losartan due to low BP   2. Permanent Afib with RVR - has longstanding AF but has failed rate control. HR markedly elevated today but asymptomatic - needs an attempt at rhythm control - start amio 400 bid with plan for DC-CV in 2 weeks - continue Eliquis - refer to EP for possible ablation - needs sleep study - discussed need to refrain from ETOH  3. Mitral regurgitation - Echo in 10/2022 showed mild to moderate MR.    4. Polysubstance abuse - He denies recent cocaine use. He is still drinking 5-10 beers weekly.  - Counseled on need to cut back   5. H/o PE - continue Eliquis - no evidence of PAH on RHC   6. Medical noncompliance - seems to have improved  Arvilla Meres, MD  1:53 PM

## 2023-02-19 NOTE — Progress Notes (Signed)
ReDS Vest / Clip - 02/19/23 1305       ReDS Vest / Clip   Station Marker C    Ruler Value 30    ReDS Value Range High volume overload    ReDS Actual Value 44

## 2023-02-20 ENCOUNTER — Other Ambulatory Visit: Payer: Self-pay | Admitting: Student in an Organized Health Care Education/Training Program

## 2023-02-20 ENCOUNTER — Telehealth: Payer: Self-pay | Admitting: Cardiovascular Disease

## 2023-02-20 ENCOUNTER — Other Ambulatory Visit: Payer: Self-pay

## 2023-02-20 LAB — CBC
Hematocrit: 53 % — ABNORMAL HIGH (ref 37.5–51.0)
Hemoglobin: 17.2 g/dL (ref 13.0–17.7)
MCH: 30.3 pg (ref 26.6–33.0)
MCHC: 32.5 g/dL (ref 31.5–35.7)
MCV: 93 fL (ref 79–97)
Platelets: 231 10*3/uL (ref 150–450)
RBC: 5.68 x10E6/uL (ref 4.14–5.80)
RDW: 13.8 % (ref 11.6–15.4)
WBC: 6.9 10*3/uL (ref 3.4–10.8)

## 2023-02-20 LAB — BASIC METABOLIC PANEL
BUN/Creatinine Ratio: 15 (ref 10–24)
BUN: 19 mg/dL (ref 8–27)
CO2: 21 mmol/L (ref 20–29)
Calcium: 10.2 mg/dL (ref 8.6–10.2)
Chloride: 98 mmol/L (ref 96–106)
Creatinine, Ser: 1.25 mg/dL (ref 0.76–1.27)
Glucose: 109 mg/dL — ABNORMAL HIGH (ref 70–99)
Potassium: 4.9 mmol/L (ref 3.5–5.2)
Sodium: 140 mmol/L (ref 134–144)
eGFR: 66 mL/min/{1.73_m2} (ref >59)

## 2023-02-20 LAB — BRAIN NATRIURETIC PEPTIDE: BNP: 110.5 pg/mL — ABNORMAL HIGH (ref 0.0–100.0)

## 2023-02-20 LAB — DIGOXIN LEVEL: Digoxin, Serum: 0.7 ng/mL (ref 0.5–0.9)

## 2023-02-20 LAB — PROTIME-INR
INR: 1.1 (ref 0.9–1.2)
Prothrombin Time: 12.2 s — ABNORMAL HIGH (ref 9.1–12.0)

## 2023-02-20 MED ORDER — APIXABAN 5 MG PO TABS: 5.0000 mg | ORAL_TABLET | Freq: Two times a day (BID) | ORAL | 1 refills | Status: DC

## 2023-02-20 NOTE — Telephone Encounter (Signed)
*  STAT* If patient is at the pharmacy, call can be transferred to refill team.   1. Which medications need to be refilled? (please list name of each medication and dose if known)   apixaban (ELIQUIS) 5 MG TABS tablet   2. Would you like to learn more about the convenience, safety, & potential cost savings by using the [facility]?   3. Are you open to using the [facility] (Type [facility]. ).  4. Which pharmacy/location (including street and city if local pharmacy) is medication to be sent to?  [id number] - [place], [place] - [address]   5. Do they need a 30 day or 90 day supply?   90 day  Patient stated he has 6 tablets left.

## 2023-02-20 NOTE — Telephone Encounter (Signed)
Prescription refill request for Eliquis received. Indication:afib Last office visit:12/24 Scr:1.01  9/24 Age: 62 Weight:93  kg  Prescription refilled

## 2023-03-02 ENCOUNTER — Encounter: Payer: 59 | Admitting: Family

## 2023-03-02 NOTE — Progress Notes (Signed)
 Spoke to pt and instructed them to come at 0915 and to be NPO after 0000.  Confirmed no missed doses of AC and instructed to take in AM with a small sip of water.  Confirmed that pt will have a ride home and someone to stay with them for 24 hours after the procedure. Instructed patient to not wear any jewelry or lotion.

## 2023-03-03 ENCOUNTER — Encounter (HOSPITAL_COMMUNITY)
Admission: RE | Disposition: A | Discharge status: Home / Self Care | Payer: Self-pay | Source: Home / Self Care | Attending: Internal Medicine

## 2023-03-03 ENCOUNTER — Ambulatory Visit (HOSPITAL_COMMUNITY)
Admission: RE | Admit: 2023-03-03 | Discharge: 2023-03-03 | Disposition: A | Discharge status: Home / Self Care | Payer: 59 | Attending: Internal Medicine | Admitting: Internal Medicine

## 2023-03-03 ENCOUNTER — Other Ambulatory Visit: Payer: Self-pay

## 2023-03-03 ENCOUNTER — Ambulatory Visit (HOSPITAL_COMMUNITY): Payer: 59 | Admitting: Anesthesiology

## 2023-03-03 DIAGNOSIS — Z86711 Personal history of pulmonary embolism: Secondary | ICD-10-CM | POA: Insufficient documentation

## 2023-03-03 DIAGNOSIS — I5020 Unspecified systolic (congestive) heart failure: Secondary | ICD-10-CM | POA: Insufficient documentation

## 2023-03-03 DIAGNOSIS — I4891 Unspecified atrial fibrillation: Secondary | ICD-10-CM | POA: Diagnosis not present

## 2023-03-03 DIAGNOSIS — I4821 Permanent atrial fibrillation: Secondary | ICD-10-CM | POA: Diagnosis present

## 2023-03-03 DIAGNOSIS — Z8616 Personal history of COVID-19: Secondary | ICD-10-CM | POA: Insufficient documentation

## 2023-03-03 DIAGNOSIS — F17220 Nicotine dependence, chewing tobacco, uncomplicated: Secondary | ICD-10-CM | POA: Diagnosis not present

## 2023-03-03 DIAGNOSIS — Z79899 Other long term (current) drug therapy: Secondary | ICD-10-CM | POA: Diagnosis not present

## 2023-03-03 DIAGNOSIS — I428 Other cardiomyopathies: Secondary | ICD-10-CM | POA: Insufficient documentation

## 2023-03-03 DIAGNOSIS — Z7901 Long term (current) use of anticoagulants: Secondary | ICD-10-CM | POA: Insufficient documentation

## 2023-03-03 HISTORY — PX: CARDIOVERSION: EP1203

## 2023-03-03 SURGERY — CARDIOVERSION (CATH LAB)
Anesthesia: General

## 2023-03-03 MED ORDER — SODIUM CHLORIDE 0.9% FLUSH
10.0000 mL | Freq: Two times a day (BID) | INTRAVENOUS | Status: DC
Start: 2023-03-03 — End: 2023-03-03

## 2023-03-03 MED ORDER — PROPOFOL 10 MG/ML IV BOLUS
INTRAVENOUS | Status: DC | PRN
  Administered 2023-03-03: 100 mg via INTRAVENOUS

## 2023-03-03 SURGICAL SUPPLY — 1 items: PAD DEFIB RADIO PHYSIO CONN (PAD) ×1 IMPLANT

## 2023-03-03 NOTE — Anesthesia Preprocedure Evaluation (Signed)
Anesthesia Evaluation  Patient identified by MRN, date of birth, ID band Patient awake  General Assessment Comment:Patient with dysphagia. Denies nausea or vomiting. NPO since before midnight.  Reviewed: Allergy & Precautions, NPO status , Patient's Chart, lab work & pertinent test results  History of Anesthesia Complications Negative for: history of anesthetic complications  Airway Mallampati: II  TM Distance: >3 FB Neck ROM: Full   Comment: Large beard Dental  (+) Teeth Intact, Dental Advidsory Given   Pulmonary shortness of breath and with exertion, neg sleep apnea, neg COPD, Patient abstained from smoking.Not current smoker Shortness of breath since covid infection 02/2019   Pulmonary exam normal breath sounds clear to auscultation       Cardiovascular Exercise Tolerance: Good METShypertension, Pt. on medications (-) angina +CHF  (-) CAD and (-) Past MI + dysrhythmias Atrial Fibrillation  Rhythm:Irregular Rate:Normal - Systolic murmurs Recent PE  TTE 2021: 1. Left ventricular ejection fraction, by estimation, is 35 to 40%. The  left ventricle has moderately decreased function. The left ventricle  demonstrates global hypokinesis. There is mild left ventricular  hypertrophy. Left ventricular diastolic  parameters are consistent with Grade I diastolic dysfunction (impaired  relaxation).  2. Right ventricular systolic function is mildly reduced. The right  ventricular size is moderately enlarged.  3. Left atrial size was mildly dilated.  4. Right atrial size was mild to moderately dilated.  5. The mitral valve is grossly normal. No evidence of mitral valve  regurgitation.  6. The aortic valve is tricuspid. Aortic valve regurgitation is trivial.  7. The inferior vena cava is normal in size with greater than 50%  respiratory variability, suggesting right atrial pressure of 3 mmHg.    Neuro/Psych negative neurological  ROS  negative psych ROS   GI/Hepatic ,neg GERD  ,,(+)     (-) substance abuse    Endo/Other  neg diabetes    Renal/GU negative Renal ROS     Musculoskeletal   Abdominal   Peds  Hematology   Anesthesia Other Findings Past Medical History: No date: Alcohol abuse No date: Cardiomyopathy [facility])     Comment:  a. 05/2019 Echo: EF 35-40%, gr1 DD. Mod enlarged RV.               Mildly dil LA. Mild to mod dil RA. Triv AI. No date: Chronic pain No date: COVID-19     Comment:  a. 02/2019 No date: Persistent atrial fibrillation (HCC) No date: Pulmonary embolism (HCC)     Comment:  a. 05/2019 CTA Chest: mild amt of PE w/in a lower lobe               branch of RPA.  Reproductive/Obstetrics                             Anesthesia Physical Anesthesia Plan  ASA: III  Anesthesia Plan: General   Post-op Pain Management:    Induction: Intravenous  PONV Risk Score and Plan: 2 and Propofol infusion and TIVA  Airway Management Planned: Nasal Cannula and Natural Airway  Additional Equipment: None  Intra-op Plan:   Post-operative Plan:   Informed Consent: I have reviewed the patients History and Physical, chart, labs and discussed the procedure including the risks, benefits and alternatives for the proposed anesthesia with the patient or authorized representative who has indicated his/her understanding and acceptance.     Dental advisory given  Plan Discussed with: CRNA and Surgeon  Anesthesia  Plan Comments: (Discussed risks of anesthesia with patient, including possibility of difficulty with spontaneous ventilation under anesthesia necessitating airway intervention, PONV, and rare risks such as cardiac or respiratory or neurological events. Patient understands.)        Anesthesia Quick Evaluation

## 2023-03-03 NOTE — Interval H&P Note (Signed)
History and Physical Interval Note:  03/03/2023 9:49 AM  Calvin Esparza  has presented today for surgery, with the diagnosis of AFIB.  The various methods of treatment have been discussed with the patient and family. After consideration of risks, benefits and other options for treatment, the patient has consented to  Procedure(s): CARDIOVERSION (N/A) as a surgical intervention.  The patient's history has been reviewed, patient examined, no change in status, stable for surgery.  I have reviewed the patient's chart and labs.  Questions were answered to the patient's satisfaction.     Shundra Wirsing

## 2023-03-03 NOTE — CV Procedure (Signed)
    DIRECT CURRENT CARDIOVERSION  NAME:  Calvin Esparza   MRN: [id number] DOB:  1961/02/08   ADMIT DATE: 03/03/2023   INDICATIONS: Atrial fibrillation    PROCEDURE:   Informed consent was obtained prior to the procedure. The risks, benefits and alternatives for the procedure were discussed and the patient comprehended these risks. Once an appropriate time out was taken, the patient had the defibrillator pads placed in the anterior and posterior position. The patient then underwent sedation by the anesthesia service. Once an appropriate level of sedation was achieved, the patient received a single biphasic, synchronized 200J shock with prompt conversion to sinus rhythm. No apparent complications.  Arvilla Meres, MD  10:09 AM

## 2023-03-03 NOTE — Transfer of Care (Signed)
Immediate Anesthesia Transfer of Care Note  Patient: Calvin Esparza  Procedure(s) Performed: CARDIOVERSION  Patient Location: Cath Lab  Anesthesia Type:General  Level of Consciousness: awake, alert , and oriented  Airway & Oxygen Therapy: Patient Spontanous Breathing and Patient connected to nasal cannula oxygen  Post-op Assessment: Report given to RN and Post -op Vital signs reviewed and stable  Post vital signs: Reviewed and stable  Last Vitals:  Vitals Value Taken Time  BP 110/73   Temp    Pulse 67   Resp 20   SpO2 98     Last Pain:  Vitals:   03/03/23 0914  TempSrc: Temporal  PainSc: 0-No pain         Complications: No notable events documented.

## 2023-03-04 ENCOUNTER — Encounter (HOSPITAL_COMMUNITY): Payer: Self-pay | Admitting: Internal Medicine

## 2023-03-04 NOTE — Anesthesia Postprocedure Evaluation (Signed)
Anesthesia Post Note  Patient: Calvin Esparza  Procedure(s) Performed: CARDIOVERSION     Patient location during evaluation: PACU Anesthesia Type: General Level of consciousness: awake and alert Pain management: pain level controlled Vital Signs Assessment: post-procedure vital signs reviewed and stable Respiratory status: spontaneous breathing, nonlabored ventilation and respiratory function stable Cardiovascular status: blood pressure returned to baseline and stable Postop Assessment: no apparent nausea or vomiting Anesthetic complications: no   No notable events documented.  Last Vitals:  Vitals:   03/03/23 1024 03/03/23 1047  BP: 104/73 115/80  Pulse: 60 60  Resp:    Temp:    SpO2:      Last Pain:  Vitals:   03/03/23 1021  TempSrc: Temporal  PainSc: 0-No pain                 Lowella Curb

## 2023-03-14 ENCOUNTER — Other Ambulatory Visit: Payer: Self-pay | Admitting: Internal Medicine

## 2023-03-21 ENCOUNTER — Other Ambulatory Visit: Payer: Self-pay | Admitting: Cardiovascular Disease

## 2023-03-25 ENCOUNTER — Other Ambulatory Visit (HOSPITAL_COMMUNITY): Payer: Self-pay

## 2023-03-31 ENCOUNTER — Ambulatory Visit: Payer: 59 | Admitting: Medical

## 2023-04-01 ENCOUNTER — Other Ambulatory Visit: Payer: Self-pay | Admitting: Internal Medicine

## 2023-04-03 ENCOUNTER — Ambulatory Visit: Payer: 59 | Attending: Cardiology | Admitting: Cardiology

## 2023-04-03 ENCOUNTER — Encounter: Payer: Self-pay | Admitting: Cardiology

## 2023-04-03 VITALS — BP 139/87 | HR 74 | Ht 70.0 in | Wt 203.6 lb

## 2023-04-03 DIAGNOSIS — I5082 Biventricular heart failure: Secondary | ICD-10-CM

## 2023-04-03 DIAGNOSIS — I5020 Unspecified systolic (congestive) heart failure: Secondary | ICD-10-CM | POA: Diagnosis not present

## 2023-04-03 DIAGNOSIS — F1910 Other psychoactive substance abuse, uncomplicated: Secondary | ICD-10-CM

## 2023-04-03 DIAGNOSIS — I428 Other cardiomyopathies: Secondary | ICD-10-CM

## 2023-04-03 DIAGNOSIS — Z86711 Personal history of pulmonary embolism: Secondary | ICD-10-CM

## 2023-04-03 DIAGNOSIS — I4811 Longstanding persistent atrial fibrillation: Secondary | ICD-10-CM | POA: Diagnosis not present

## 2023-04-03 DIAGNOSIS — I340 Nonrheumatic mitral (valve) insufficiency: Secondary | ICD-10-CM

## 2023-04-03 MED ORDER — LOSARTAN POTASSIUM 25 MG PO TABS: 12.5000 mg | ORAL_TABLET | Freq: Every day | ORAL | 3 refills | Status: DC

## 2023-04-03 NOTE — Patient Instructions (Signed)
Medication Instructions:  Your physician has recommended you make the following change in your medication:   RESTART Losartan 12.5 mg once daily   *If you need a refill on your cardiac medications before your next appointment, please call your pharmacy*   Lab Work: BMP in 1 month here in our office. Lab hours are 08:30 am to 4:00 pm and they are closed between 1-2 for lunch break.  If you have labs (blood work) drawn today and your tests are completely normal, you will receive your results only by: MyChart Message (if you have MyChart) OR A paper copy in the mail If you have any lab test that is abnormal or we need to change your treatment, we will call you to review the results.   Testing/Procedures: None   Follow-Up: At [facility], you and your health needs are our priority.  As part of our continuing mission to provide you with exceptional heart care, we have created designated Provider Care Teams.  These Care Teams include your primary Cardiologist (physician) and Advanced Practice Providers (APPs -  Physician Assistants and Nurse Practitioners) who all work together to provide you with the care you need, when you need it.  We recommend signing up for the patient portal called "MyChart".  Sign up information is provided on this After Visit Summary.  MyChart is used to connect with patients for Virtual Visits (Telemedicine).  Patients are able to view lab/test results, encounter notes, upcoming appointments, etc.  Non-urgent messages can be sent to your provider as well.   To learn more about what you can do with MyChart, go to [email or web address].    Your next appointment:   3-4 month(s)  Provider:   Julien Nordmann, MD or Charlsie Quest, NP

## 2023-04-03 NOTE — Progress Notes (Signed)
Cardiology Office Note:  .   Date:  04/03/2023  ID:  Calvin Esparza, DOB 04-Aug-1960, MRN [id number] PCP: Calvin Regulus, MD  [facility] Providers Cardiologist:  Calvin Nordmann, MD    History of Present Illness: .   Calvin Esparza is a 63 y.o. male past medical history of biventricular failure with NICM, permanent atrial fibrillation, PE (05/2019 and again in 6/21) in the setting of medical noncompliance, cocaine abuse, alcohol abuse, medical nonadherence, chronic pain, and gout, who presents today for follow-up.   He was diagnosed with A-fib in 03/2018 when he presented with chest pain and dizziness.  He was placed on beta-blocker and Xarelto, though was intermittently compliant with medical therapy. In 05/2019, he presented to the ED with chest tightness and dyspnea and was found to be in A-fib with RVR.  CTA of the chest showed mild amount of pulmonary embolism within a lower branch of the right pulmonary artery.  Echo demonstrated an EF of 35 to 40% with grade 1 diastolic dysfunction and moderately enlarged right heart chambers.  Stress testing was undertaken and nonischemic.  He was discharged on Eliquis with initial plan for rate control.  He was subsequently transitioned to Xarelto to improve medication compliance and underwent DCCV in 07/2019.  He was readmitted in 08/2019 in the setting of dyspnea and recurrent A-fib in the context of medication noncompliance.  CTA of the chest showed small pulmonary emboli in the right middle and lower lobe pulmonary arteries.  His recurrent PE was felt to be secondary to medication nonadherence.  He was readmitted in 09/2019 with A-fib and presyncope in the setting of dehydration with AKI.  Since then, the focus has been on rate control and anticoagulation adherence.  He was seen in the ED in 02/2020 with exertional dyspnea and volume overload.  He was in A-fib with RVR.  He reported dysphagia and was not adherent with any of his pills except  Xarelto.  With resumption of medications and IV diuresis he had significant improvement.  He underwent EGD which showed multiple gastric erosions, mid esophageal white lesion, and portal gastropathy.  No varices were noted.  He was admitted to the hospital in 07/2021 with increased shortness of breath and was found to be COVID-positive.  He was also felt to be volume up and treated with IV Lasix.  Urine drug screen was positive for cocaine.  Echo showed an EF of 40 to 45%, global hypokinesis, normal RV systolic function and ventricular cavity size, moderately dilated left atrium, mild mitral regurgitation, and an estimated right atrial pressure of 3 mmHg.  He was last seen by general cardiology in 09/2021 and was euvolemic.  Recommendation was made to escalate GDMT as labs and medical adherence of mild.  Since then, he was followed by the [facility] CHF clinic.    He was admitted to the hospital in 10/2022 with acute on chronic combined systolic and diastolic CHF and A-fib with RVR in the setting of medication noncompliance.  High-sensitivity troponin negative x 2.  BNP 910.  CTA chest showed no PE or evidence of coronary artery calcification/aortic atherosclerosis.  Echo showed an EF of 20 to 25%, global hypokinesis, mildly reduced RV systolic function with mildly enlarged ventricular cavity size, mildly elevated RVSP estimated at 43.7 mmHg, severe left atrial dilatation, mildly dilated right atrium, mild to moderate mitral regurgitation, mild aortic insufficiency, and an estimated right atrial pressure of 8 mmHg.  He had symptomatic improvement with IV diuresis and  underwent R/LHC that showed no angiographically significant CAD with upper normal left and right heart filling pressures, severely reduced cardiac output and index, and low PA PI suggestive of a component of right heart failure.  In this setting, he was initiated on milrinone with improvement in oxygen saturation with subsequent weaning, and  recommendation to follow-up with the advanced heart failure service in the outpatient setting.  At time of discharge it was recommended he stop diltiazem, Farxiga, Toprol-XL, KCl, torsemide, and rivaroxaban.  From a cardiac perspective, it was recommended he take apixaban 5 mg twice daily, digoxin 0.125 mg daily, Jardiance 10 mg, furosemide 20 mg daily, losartan 12.5 mg daily, and spironolactone 12.5 mg daily.   He was last seen in clinic 02/26/2023 by Dr. Gala Romney stating that he felt okay but was easily tired.  Did endorse occasional shortness of breath.  Continues to drink a lot of fluid and feels bloated often.  Continues to drink 4-5 beers on Saturdays denies any recent cocaine use.  Started on amiodarone 400 mg twice daily with a plan for cardioversion in 2 weeks with a referral to EP for possible ablation later on.  Underwent cardioversion procedure on 03/03/2023 received 1 shock and 200 J with prompt conversion to sinus rhythm without any apparent complications postprocedure.  He returns to clinic today stating that he has been doing well.  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  He denies any shortness of breath chest pain palpitations peripheral edema or weight gain.  States that he continues to drink 4-5 beers a week on Saturday primarily.  Denies any illicit or recreational drug use.  States that he has been compliant with his current medication regimen without any missed doses of his Eliquis.  He denies any bleeding with no blood noted in his urine or stool.  States that he has felt well since his cardioversion procedure has been completed.  Denies any recent hospitalizations or visits to the emergency department.   ROS: 10 point review of systems has been reviewed and considered negative except what is been listed in HPI  Studies Reviewed: Marland Kitchen   EKG  Interpretation Date/Time:  Friday April 03 2023 10:31:56 EST Ventricular Rate:  74 PR Interval:  162 QRS Duration:  90 QT Interval:  402 QTC Calculation: 446 R Axis:   -20  Text Interpretation: Normal sinus rhythm Possible Left atrial enlargement Minimal voltage criteria for LVH, may be normal variant ( R in aVL ) When compared with ECG of 03-Mar-2023 10:12, Confirmed by Charlsie Quest ([id number]) on 04/03/2023 10:34:49 AM    2D echo 06/08/2019:  1. Left ventricular ejection fraction, by estimation, is 35 to 40%. The  left ventricle has moderately decreased function. The left ventricle  demonstrates global hypokinesis. There is mild left ventricular  hypertrophy. Left ventricular diastolic  parameters are consistent with Grade I diastolic dysfunction (impaired  relaxation).   2. Right ventricular systolic function is mildly reduced. The right  ventricular size is moderately enlarged.   3. Left atrial size was mildly dilated.   4. Right atrial size was mild to moderately dilated.   5. The mitral valve is grossly normal. No evidence of mitral valve  regurgitation.   6. The aortic valve is tricuspid. Aortic valve regurgitation is trivial.   7. The inferior vena cava is normal in size with greater than 50%  respiratory variability, suggesting right atrial pressure of 3 mmHg.   Lexiscan MPI 06/10/2019: Pharmacological myocardial perfusion imaging study with no significant ischemia Moderate global hypokinesis, EF estimated at 32% No EKG changes concerning for ischemia at peak stress or in recovery. Baseline EKG with atrial fibrillation, rate 100 bpm Low risk scan   2D echo 07/18/2021: 1. Left ventricular ejection fraction, by estimation, is 40 to 45%. The  left ventricle has mildly decreased function. The left ventricle  demonstrates global hypokinesis. There is mild left ventricular  hypertrophy. Left ventricular diastolic parameters  are indeterminate.   2. Right ventricular systolic  function is normal. The right ventricular  size is normal. Tricuspid regurgitation signal is inadequate for assessing  PA pressure.   3. Left atrial size was moderately dilated.   4. The mitral valve is normal in structure. Mild mitral valve  regurgitation. No evidence of mitral stenosis.   5. The aortic valve is normal in structure. Aortic valve regurgitation is  mild. No aortic stenosis is present.   6. The inferior vena cava is normal in size with greater than 50%  respiratory variability, suggesting right atrial pressure of 3 mmHg   2D echo 10/24/2022: 1. Left ventricular ejection fraction, by estimation, is 20 to 25%. Left  ventricular ejection fraction by PLAX is 23 %. The left ventricle has  severely decreased function. The left ventricle demonstrates global  hypokinesis. Left ventricular diastolic  parameters are indeterminate.   2. Right ventricular systolic function is mildly reduced. The right  ventricular size is mildly enlarged. There is mildly elevated pulmonary  artery systolic pressure.   3. Left atrial size was severely dilated.   4. Right atrial size was mildly dilated.   5. The mitral valve is normal in structure. Mild to moderate mitral valve  regurgitation.   6. The aortic valve is tricuspid. Aortic valve regurgitation is mild.   7. The inferior vena cava is dilated in size with >50% respiratory  variability, suggesting right atrial pressure of 8 mmHg.    R/LHC 10/27/2022: Conclusions: No angiographically significant coronary artery disease. Upper normal left and right heart filling pressures. Severely reduced Fick cardiac output/index.  Low PAPi suggests a component of right heart failure as well.   Recommendations: Transfer to stepdown and initiate milrinone therapy. Decrease metoprolol succinate in the setting of low-output heart failure. Maintain net even fluid balance. If cardiac output cannot be improved and/or milrinone cannot be weaned off, will need to  consider transfer to [facility] for further management by the advanced heart failure team.   Risk Assessment/Calculations:    CHA2DS2-VASc Score = 1   This indicates a 0.6% annual risk of stroke. The patient's score is based upon:      Physical Exam:   VS:  BP 139/87 (BP Location: Left Arm, Patient Position: Sitting, Cuff Size: Normal)   Pulse 74   Ht 5\' 10"  (1.778 m)   Wt 203 lb 9.6 oz (92.4 kg)   SpO2 97%   BMI 29.21 kg/m    Wt Readings from Last 3 Encounters:  04/03/23 203 lb 9.6 oz (92.4 kg)  03/03/23 200 lb (90.7 kg)  02/19/23 205 lb 2 oz (93 kg)    GEN: Well nourished, well developed in no acute distress NECK: No JVD; No carotid bruits CARDIAC: RRR, no murmurs, rubs, gallops RESPIRATORY:  Clear to auscultation without rales, wheezing or rhonchi  ABDOMEN: Soft, non-tender, non-distended EXTREMITIES:  No edema; No deformity   ASSESSMENT AND PLAN: .  Chronic systolic biventricular failure with NICM echo on 10/2022 with an EF of 20-25%, mildly reduced RV, mild to moderate MR, mild aortic insufficiency.  Right and left heart catheterization revealed no significant coronary artery disease.  PCWP 16.  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  NYHA II symptoms today.  He appears to be euvolemic on exam.  He is continued on GDMT with Jardiance 10 mg daily, Toprol-XL 25 mg daily, Aldactone 25 mg daily and restarted on losartan today at 12.5 mg daily as previously had been stopped due to low blood pressures.  He has been encouraged to continue to weigh daily, watch his sodium and fluid intake.  He is also been encouraged to continue with his follow-up appointments with advanced heart failure clinic.  With restarting losartan he has been scheduled for follow-up appointment in 2 weeks.  Permanent atrial fibrillation with RVR with longstanding A-fib and has failed rate control.  Status post cardioversion sinus rhythm on EKG today at a rate of 74 with left atrial enlargement  and LVH.  He is continued on amiodarone 200 mg twice daily, continued on apixaban 5 mg twice daily, and digoxin alternating of whole tablet to half a tablet.  Discussed continued refraining from alcohol which patient is not very interested in.  Discussed sleep study.  Previous notes from advanced heart failure revealed referral to EP.  TSH 1.952 on 03/19/2023.  Most recent dig level 0.7 02/26/2023.  Mitral regurgitation noted mild to moderate on echocardiogram done 10/2022, continue to be followed by surveillance echoes.  Polysubstance abuse where he denies any recent cocaine use.  Continues to drink several beers on the weekends.  Counseled again on cutting back.  History of pulmonary embolus where he is continued on apixaban 5 mg twice daily.  No evidence of pulmonary hypertension on right heart catheterization.  Longstanding history of medical noncompliance which patient states today that he has been extremely compliant with his current medication regimen and has not missed any doses.       Dispo: Patient return to clinic to see MD/APP in 3 to 4 months or sooner if needed  Signed, Marwa Fuhrman, NP

## 2023-04-26 ENCOUNTER — Other Ambulatory Visit: Payer: Self-pay | Admitting: Internal Medicine

## 2023-04-29 ENCOUNTER — Telehealth: Payer: Self-pay | Admitting: Internal Medicine

## 2023-04-29 NOTE — Telephone Encounter (Signed)
Lvm to confirm appt on 04/30/23

## 2023-04-30 ENCOUNTER — Ambulatory Visit: Payer: 59 | Attending: Internal Medicine | Admitting: Internal Medicine

## 2023-04-30 VITALS — BP 114/68 | HR 85 | Wt 206.6 lb

## 2023-04-30 DIAGNOSIS — Z79899 Other long term (current) drug therapy: Secondary | ICD-10-CM | POA: Insufficient documentation

## 2023-04-30 DIAGNOSIS — I428 Other cardiomyopathies: Secondary | ICD-10-CM | POA: Diagnosis not present

## 2023-04-30 DIAGNOSIS — I5043 Acute on chronic combined systolic (congestive) and diastolic (congestive) heart failure: Secondary | ICD-10-CM | POA: Insufficient documentation

## 2023-04-30 DIAGNOSIS — Z86711 Personal history of pulmonary embolism: Secondary | ICD-10-CM | POA: Insufficient documentation

## 2023-04-30 DIAGNOSIS — I5082 Biventricular heart failure: Secondary | ICD-10-CM | POA: Insufficient documentation

## 2023-04-30 DIAGNOSIS — G8929 Other chronic pain: Secondary | ICD-10-CM | POA: Insufficient documentation

## 2023-04-30 DIAGNOSIS — Z8616 Personal history of COVID-19: Secondary | ICD-10-CM | POA: Insufficient documentation

## 2023-04-30 DIAGNOSIS — Z7901 Long term (current) use of anticoagulants: Secondary | ICD-10-CM | POA: Insufficient documentation

## 2023-04-30 DIAGNOSIS — I4821 Permanent atrial fibrillation: Secondary | ICD-10-CM | POA: Diagnosis not present

## 2023-04-30 DIAGNOSIS — Z7984 Long term (current) use of oral hypoglycemic drugs: Secondary | ICD-10-CM | POA: Diagnosis not present

## 2023-04-30 DIAGNOSIS — I340 Nonrheumatic mitral (valve) insufficiency: Secondary | ICD-10-CM | POA: Insufficient documentation

## 2023-04-30 DIAGNOSIS — Z91148 Patient's other noncompliance with medication regimen for other reason: Secondary | ICD-10-CM | POA: Insufficient documentation

## 2023-04-30 DIAGNOSIS — I5022 Chronic systolic (congestive) heart failure: Secondary | ICD-10-CM | POA: Diagnosis present

## 2023-04-30 DIAGNOSIS — F1910 Other psychoactive substance abuse, uncomplicated: Secondary | ICD-10-CM | POA: Diagnosis not present

## 2023-04-30 MED ORDER — LOSARTAN POTASSIUM 25 MG PO TABS: 25.0000 mg | ORAL_TABLET | Freq: Every day | ORAL | 3 refills | Status: AC

## 2023-04-30 NOTE — Progress Notes (Signed)
ReDS Vest / Clip - 04/30/23 1300       ReDS Vest / Clip   Station Marker D    Ruler Value 36    ReDS Value Range Low volume    ReDS Actual Value 34    Anatomical Comments sitting

## 2023-04-30 NOTE — Progress Notes (Signed)
ADVANCED HF CLINIC NOTE  Referring Physician: Lauro Regulus, MD Primary Care: Lauro Regulus, MD Primary Cardiologist: Julien Nordmann, MD  HPI: Calvin Esparza is a 63 y.o. male with a hx of biventricular systolic heart failure with nonischemic cardiomyopathy, permanent A-fib, PE in 05/2019, 08/2019 in the setting of medical noncompliance, polysubstance abuse (cocaine, alcohol), chronic pain who is referred by Dr. Mariah Milling for further evaluation of his HF.   He was diagnosed with A-fib in January 2020 when he presented with chest pain and dizziness.  He was placed on a beta-blocker and Xarelto, though was intermittently compliant with medical therapy.  In 05/2019 he presented to the ED with chest tightness and dyspnea and was found to be in A-fib with RVR.  CT of the chest showed mild amount of pulmonary embolism within the lower branch of the right pulmonary artery. Echo EF of 35 to 40%, grade 1 diastolic dysfunction.  Stress test was nonischemic.  He was discharged on Eliquis with initial plan for rate control.  He was subsequently transition to Xarelto to improve medication compliance and underwent cardioversion in May 2021.  Readmitted June 2021 in the setting of dyspnea and recurrent A-fib in the context of medication noncompliance.  CT of the chest showed small pulmonary emboli in the right middle and lower lobe pulmonary arteries.   He was readmitted in July 2021 with A-fib and presyncope in the setting of dehydration with AKI.  Patient was admitted in May 2023 with shortness of breath and found to be COVID-positive. He was in AF with RVR. He was felt to be volume up and treated with IV Lasix.  UDS was positive for cocaine.  Echo EF 40-45%.   Patient was readmitted in August 2024 with acute on chronic combined systolic and diastolic CHF with A-fib RVR in the setting of medication noncompliance.  BNP 910.  Echo EF 20-25%, mildly reduced RV, mild to moderate MR, mild aortic  insufficiency. He was diuresed and underwent had R/L cath with no significant CAD. RA 8 PA 23/16 (18) PCWP 16 Fick 2.7/1.4 PAPi 0.9  He was initiated on milrinone with improvement.  Patient was discharged on Eliquis, digoxin, Jardiance, Lasix 20 mg daily, losartan and spironolactone.  Patient was last seen December 03 2022 and was overall stable from a cardiac perspective.  In the context of relative hypotension losartan was discontinued and he was started on metoprolol 12.5 mg daily.  Was seen by Cadence Furth PA-C on 01/27/23. Was in AF with RVR.  Toprol increased to 25 mg  Today he returns for AHF follow up, he underwent DCCV 03/02/24. Overall feeling great. Denies palpitations, CP, dizziness, edema, or PND/Orthopnea. No SOB. Appetite great, he eats anything. No fever or chills. Weight at home 205 pounds. Taking all medications. Drinks 2 beers a day, has increased since going back to work. Recently went back to work 2 weeks ago, feels wired while at work.    Past Medical History:  Diagnosis Date   Alcohol abuse    Chronic pain    COVID-19    a. 02/2019   HFrEF (heart failure with reduced ejection fraction) (HCC)    a. 05/2019 Echo: EF 35-40%; b. 07/2021 Echo: EF 40-45%; c. 10/2022 Echo: EF 20-25%, glob HK, mildly reduced RV fxn, sev dil LA, mid-mod MR, mild AI.   History of medication noncompliance    Longstanding persistent atrial fibrillation (HCC)    a. Dx 03/2018; b. 05/2019 recurrent Afib in setting of PE.  CHA2DS2VASc = 1-->Eliquis later changed to xarelto; b. 07/2019 s/p DCCV (150J); d. 08/2019 admit w/ AF RVR in settting of noncompliance - longstanding persistent afib since on bb/ccb rx.   NICM (nonischemic cardiomyopathy) (HCC)    a. 05/2019 Echo: EF 35-40%; b. 05/2019 MV: EF 32%, no ischemia; c. 07/2021 Echo: EF 40-45%; d. 10/2022 Echo: EF 20-25%, glob HK.   Pulmonary embolism (HCC)    a. 05/2019 CTA Chest: mild amt of PE w/in a lower lobe branch of RPA; b. 08/2019 CTA Chest: Small PE in RML  and RLL PA.    Current Outpatient Medications  Medication Sig Dispense Refill   acetaminophen (TYLENOL) 500 MG tablet Take 1,000 mg by mouth 2 (two) times daily as needed for moderate pain (pain score 4-6) or mild pain (pain score 1-3).     allopurinol (ZYLOPRIM) 300 MG tablet Take 300 mg by mouth daily.     amiodarone (PACERONE) 200 MG tablet TAKE 2 TABLETS BY MOUTH 2 TIMES DAILY. (Patient taking differently: Take 200 mg by mouth daily.) 360 tablet 1   apixaban (ELIQUIS) 5 MG TABS tablet Take 1 tablet (5 mg total) by mouth 2 (two) times daily. 180 tablet 1   atorvastatin (LIPITOR) 40 MG tablet Take 1 tablet (40 mg total) by mouth daily. 90 tablet 3   colchicine 0.6 MG tablet Take 1 tablet (0.6 mg total) by mouth daily as needed. (Patient taking differently: Take 0.6 mg by mouth daily.) 90 tablet 0   empagliflozin (JARDIANCE) 10 MG TABS tablet Take 1 tablet (10 mg total) by mouth daily. 90 tablet 1   furosemide (LASIX) 40 MG tablet Take 1 tablet (40 mg total) by mouth daily. 30 tablet 3   losartan (COZAAR) 25 MG tablet Take 0.5 tablets (12.5 mg total) by mouth daily. Take half a tablet once daily 90 tablet 3   metoprolol succinate (TOPROL XL) 25 MG 24 hr tablet Take 1 tablet (25 mg total) by mouth daily. 45 tablet 3   spironolactone (ALDACTONE) 25 MG tablet Take 1 tablet (25 mg total) by mouth daily. 45 tablet 3   No current facility-administered medications for this visit.    No Known Allergies    Social History   Socioeconomic History   Marital status: Married    Spouse name: Not on file   Number of children: Not on file   Years of education: Not on file   Highest education level: Not on file  Occupational History   Not on file  Tobacco Use   Smoking status: Never   Smokeless tobacco: Current    Types: Chew  Vaping Use   Vaping status: Never Used  Substance and Sexual Activity   Alcohol use: Yes    Alcohol/week: 7.0 standard drinks of alcohol    Types: 7 Cans of beer per  week    Comment: He states he hasn't drank in the past 2 weeks but has been a heavy drinker in the past   Drug use: Not Currently   Sexual activity: Not on file  Other Topics Concern   Not on file  Social History Narrative   Lives locally w/ wife.  Does not routinely exercise.  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   Social Drivers of Corporate investment banker Strain: Not on file  Food Insecurity: No Food Insecurity (11/07/2022)   Hunger Vital Sign    Worried About Running Out of Food in the Last Year: Never true    Ran Out of  Food in the Last Year: Never true  Transportation Needs: No Transportation Needs (11/07/2022)   PRAPARE - Administrator, Civil Service (Medical): No    Lack of Transportation (Non-Medical): No  Physical Activity: Not on file  Stress: Not on file  Social Connections: Not on file  Intimate Partner Violence: Not At Risk (10/24/2022)   Humiliation, Afraid, Rape, and Kick questionnaire    Fear of Current or Ex-Partner: No    Emotionally Abused: No    Physically Abused: No    Sexually Abused: No    Family History  Problem Relation Age of Onset   Bone cancer Father    Stroke Father    Diabetes Sister    Vitals:   04/30/23 1325  BP: 114/68  Pulse: 85  SpO2: 94%  Weight: 206 lb 9.6 oz (93.7 kg)   PHYSICAL EXAM: General:  well appearing.  No respiratory difficulty HEENT: normal Neck: supple. JVD flat. Carotids 2+ bilat; no bruits. No lymphadenopathy or thyromegaly appreciated. Cor: PMI nondisplaced. Regular rate & rhythm. No rubs, gallops or murmurs. Lungs: clear Abdomen: soft, nontender, nondistended. No hepatosplenomegaly. No bruits or masses. Good bowel sounds. Extremities: no cyanosis, clubbing, rash, edema  Neuro: alert & oriented x 3, cranial nerves grossly intact. moves all 4 extremities w/o difficulty. Affect pleasant.   ECG: NSR 86 bpm (Personally reviewed)    ASSESSMENT & PLAN:  1. Chronic systolic with biventricular failure  with NICM - Echo 5/23 EF 40-45% - Echo 8/24 EF 20-25%, mildly reduced RV, mild to moderate MR, mild aortic insufficiency. - R/L cath with no significant CAD. 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 ReDs reading: 34 %, normal - Continue lasix 40 mg daily - Continue spironolactone 25mg  daily.  - Continue Toprol 25 - Continue Jardiance 10. -Increase losartan to 25 - Is set to get labs today, BMET ordered   2. Permanent Afib with RVR - has longstanding AF but has failed rate control. HR markedly elevated today but asymptomatic - s/p DCCV 12/24 - Continue amiodarone 200 mg daily - continue Eliquis. Denies abnormal bleeing - Refer to EP for possible ablation - Not interested in sleep study at this time. Willing to reconsider later on.   - discussed need to refrain from ETOH  3. Mitral regurgitation - Echo in 10/2022 showed mild to moderate MR.    4. Polysubstance abuse - He denies recent cocaine use. He is still drinking ~10 beers weekly.  - Counseled on need to cut back   5. H/o PE - continue Eliquis - no evidence of PAH on RHC   6. Medical noncompliance - seems to have improved  Follow up in 3 months with APP  Alen Bleacher, NP  1:56 PM  Patient seen and examined with the above-signed Advanced Practice Provider and/or Housestaff. I personally reviewed laboratory data, imaging studies and relevant notes. I independently examined the patient and formulated the important aspects of the plan. I have edited the note to reflect any of my changes or salient points. I have personally discussed the plan with the patient and/or family.  Underwent DC-CV in December. Says he feels much better. Back to work Designer, industrial/product. Denies palpitations. No significant SOB, orthopnea or PND. Still drinking some. No cocaine.   General:  Well appearing. No resp difficulty HEENT: normal Neck: supple. no JVD. Carotids 2+  bilat; no bruits. No  lymphadenopathy or thryomegaly appreciated. Cor: PMI nondisplaced. Regular rate & rhythm. No rubs, gallops or murmurs. Lungs: clear Abdomen: soft, nontender, nondistended. No hepatosplenomegaly. No bruits or masses. Good bowel sounds. Extremities: no cyanosis, clubbing, rash, edema Neuro: alert & orientedx3, cranial nerves grossly intact. moves all 4 extremities w/o difficulty. Affect pleasant  Increase losartan to 25. Repeat echo to see if EF improved with NSR. Continue amio 200 daily and Eliquis. No bleeding. Refer to EP for AF ablation.  ECG NSR 86 Personally reviewed  Bloodwork today.    Arvilla Meres, MD  2:30 PM

## 2023-04-30 NOTE — Patient Instructions (Addendum)
Labs done today. We will contact you only if your labs are abnormal.  INCREASE Losartan 25mg  (1 tablet) by mouth daily.   No other medication changes were made. Please continue all current medications as prescribed.  You have been referred to Electrophysiology. They will contact you to schedule an appointment.   Your physician recommends that you schedule a follow-up appointment soon for an echo and in 2 months for an appointment with Dr. Leory Plowman. Please contact our office in March to schedule a April appointment.  Your physician has requested that you have an echocardiogram. Echocardiography is a painless test that uses sound waves to create images of your heart. It provides your doctor with information about the size and shape of your heart and how well your heart's chambers and valves are working. This procedure takes approximately one hour. There are no restrictions for this procedure. Please do NOT wear cologne, perfume, aftershave, or lotions (deodorant is allowed). Please arrive 15 minutes prior to your appointment time.  Please note: We ask at that you not bring children with you during ultrasound (echo/ vascular) testing. Due to room size and safety concerns, children are not allowed in the ultrasound rooms during exams. Our front office staff cannot provide observation of children in our lobby area while testing is being conducted. An adult accompanying a patient to their appointment will only be allowed in the ultrasound room at the discretion of the ultrasound technician under special circumstances. We apologize for any inconvenience.  If you have any questions or concerns before your next appointment please send [place] a message through [employer] or call our office at [phone].    TO LEAVE A MESSAGE FOR THE NURSE SELECT OPTION 2, PLEASE LEAVE A MESSAGE INCLUDING: YOUR NAME DATE OF BIRTH CALL BACK NUMBER REASON FOR CALL**this is important as we prioritize the call backs  YOU WILL  RECEIVE A CALL BACK THE SAME DAY AS LONG AS YOU CALL BEFORE 4:00 PM   Do the following things EVERYDAY: Weigh yourself in the morning before breakfast. Write it down and keep it in a log. Take your medicines as prescribed Eat low salt foods--Limit salt (sodium) to 2000 mg per day.  Stay as active as you can everyday Limit all fluids for the day to less than 2 liters   At the [facility], you and your health needs are our priority. As part of our continuing mission to provide you with exceptional heart care, we have created designated Provider Care Teams. These Care Teams include your primary Cardiologist (physician) and Advanced Practice Providers (APPs- Physician Assistants and Nurse Practitioners) who all work together to provide you with the care you need, when you need it.   You may see any of the following providers on your designated Care Team at your next follow up: Dr Arvilla Meres Dr Marca Ancona Dr. Marcos Eke, NP Robbie Lis, [place] [facility], [place] Brynda Peon, NP Karle Plumber, PharmD   Please be sure to bring in all your medications bottles to every appointment.    Thank you for choosing [facility]

## 2023-04-30 NOTE — Addendum Note (Signed)
Addended by: Rob Bunting R on: 04/30/2023 02:42 PM   Modules accepted: Orders

## 2023-05-01 LAB — CBC
Hematocrit: 45.9 % (ref 37.5–51.0)
Hemoglobin: 14.9 g/dL (ref 13.0–17.7)
MCH: 30.5 pg (ref 26.6–33.0)
MCHC: 32.5 g/dL (ref 31.5–35.7)
MCV: 94 fL (ref 79–97)
Platelets: 214 10*3/uL (ref 150–450)
RBC: 4.89 x10E6/uL (ref 4.14–5.80)
RDW: 14 % (ref 11.6–15.4)
WBC: 6 10*3/uL (ref 3.4–10.8)

## 2023-05-01 LAB — BASIC METABOLIC PANEL
BUN/Creatinine Ratio: 20 (ref 10–24)
BUN: 27 mg/dL (ref 8–27)
CO2: 23 mmol/L (ref 20–29)
Calcium: 9.7 mg/dL (ref 8.6–10.2)
Chloride: 100 mmol/L (ref 96–106)
Creatinine, Ser: 1.37 mg/dL — ABNORMAL HIGH (ref 0.76–1.27)
Glucose: 93 mg/dL (ref 70–99)
Potassium: 4.3 mmol/L (ref 3.5–5.2)
Sodium: 140 mmol/L (ref 134–144)
eGFR: 59 mL/min/{1.73_m2} — ABNORMAL LOW (ref >59)

## 2023-05-01 LAB — BRAIN NATRIURETIC PEPTIDE: BNP: 16.8 pg/mL (ref 0.0–100.0)

## 2023-06-01 ENCOUNTER — Ambulatory Visit
Admission: RE | Admit: 2023-06-01 | Discharge: 2023-06-01 | Disposition: A | Discharge status: Home / Self Care | Payer: 59 | Source: Ambulatory Visit | Attending: Internal Medicine | Admitting: Internal Medicine

## 2023-06-01 DIAGNOSIS — I340 Nonrheumatic mitral (valve) insufficiency: Secondary | ICD-10-CM | POA: Insufficient documentation

## 2023-06-01 DIAGNOSIS — I517 Cardiomegaly: Secondary | ICD-10-CM | POA: Diagnosis not present

## 2023-06-01 DIAGNOSIS — I428 Other cardiomyopathies: Secondary | ICD-10-CM | POA: Insufficient documentation

## 2023-06-01 DIAGNOSIS — I5082 Biventricular heart failure: Secondary | ICD-10-CM | POA: Insufficient documentation

## 2023-06-01 LAB — ECHOCARDIOGRAM COMPLETE
AR max vel: 2.45 cm2
AV Area VTI: 2.94 cm2
AV Area mean vel: 2.57 cm2
AV Mean grad: 2 mmHg
AV Peak grad: 3.6 mmHg
Ao pk vel: 0.95 m/s
Area-P 1/2: 2.78 cm2
S' Lateral: 4.2 cm

## 2023-06-01 NOTE — Progress Notes (Signed)
*  PRELIMINARY RESULTS* Echocardiogram 2D Echocardiogram has been performed.  Cristela Blue 06/01/2023, 10:36 AM

## 2023-07-11 ENCOUNTER — Other Ambulatory Visit: Payer: Self-pay | Admitting: Internal Medicine

## 2023-08-03 ENCOUNTER — Telehealth: Payer: Self-pay | Admitting: Family

## 2023-08-05 ENCOUNTER — Ambulatory Visit: Payer: 59 | Admitting: Cardiology

## 2023-08-18 ENCOUNTER — Other Ambulatory Visit: Payer: Self-pay | Admitting: Family

## 2023-08-18 NOTE — Progress Notes (Unsigned)
 Cardiology Office Note    Date:  08/21/2023   ID:  MICAHEL Esparza, DOB 07/24/1960, MRN [id number]  PCP:  Jimmy Moulding, MD  Cardiologist:  Belva Boyden, MD  Electrophysiologist:  None   Chief Complaint: Follow up  History of Present Illness:   Calvin Esparza is a 63 y.o. male with history of ventricular NICM, permanent atrial fibrillation, PE (06/04/2019 and 09/04/2019) in the setting of medical noncompliance, cocaine abuse, alcohol abuse, medical nonadherence, chronic pain, and gout who presents for follow up on atrial fibrillation and cardiomyopathy.    Patient was diagnosed with A-fib in 03/2018 when he presented with chest pain and dizziness.  He is placed on a beta-blocker and Xarelto , though was intermittently compliant with medical therapy.  In 05/2019, he presented to the ED with chest tightness and dyspnea and was found to be in A-fib with RVR.  CTA of the chest showed mild pulmonary embolism with a lower branch of the right pulmonary artery.  Echo with a EF 35 to 40% with G1 DD and moderately enlarged right heart chambers.  Stress testing was completed and nonischemic.  He was discharged on Eliquis  with initial plan for rate control.  He was subsequently transition to Xarelto  to improve medication compliance and underwent DCCV 07/2019. He was readmitted 08/2019 in the setting of dyspnea and recurrent A-fib in the context of medical noncompliance.  CTA of the chest showed small pulmonary emboli in the right middle and lower lobe pulmonary arteries.  His recurrent PE was felt to be secondary to medication nonadherence.  He was readmitted 09/2019 with A-fib and presyncope in the setting of dehydration with AKI.  Since then, the focus has been on rate control and anticoagulation adherence.  He was seen in the ED 02/2020 with exertional dyspnea and volume overload.  He was also noted to be in A-fib with RVR.  He reported dysphagia and was not adherent with any of his pills besides  Xarelto .  With resumption of medications and IV diuresis he had significant improvement.  He underwent EGD which showed multiple gastric erosions, mid esophageal white lesion, and portal gastropathy.  No varices were noted.  He was admitted to the hospital 07/2021 with increased shortness of breath and was found to have COVID.  He was also found to be volume overloaded and treated with IV Lasix .  Urine drug screen was positive for cocaine.  Echo showed EF of 40 to 45%, global hypokinesis, mild MR.  He has been followed by [facility] CHF clinic.    He was admitted to the hospital 10/2022 with acute on chronic combined systolic and diastolic heart failure and A-fib with RVR in the setting of medication noncompliance.  CTA chest without evidence for PE.  Echo showed EF of 20 to 25%, global hypokinesis, mildly reduced RV systolic function with mildly enlarged ventricular cavity size, mildly elevated RVSP estimated at 43.7 mmHg, severe left atrial dilation, mildly dilated right atrium, mild to moderate MR, aortic insufficiency.  He had symptomatic improvement with IV diuresis underwent R/LHC that showed no angiographically significant CAD with upper normal left and right heart filling pressures, severely reduced cardiac output and index, and a component of right heart failure.  He was initiated on milrinone  with improvement in oxygen saturation with subsequent weaning, and recommended to follow-up with advanced heart failure service in the outpatient setting.  Patient was seen by Dr. Julane Ny 02/2023 and started on amiodarone  with plan for cardioversion and recommendation for referral to  EP for possible ablation.  Patient underwent successful DCCV on 03/03/2023.   Patient was most recently seen in the cardiology clinic 04/03/2023 overall doing well from a cardiac perspective.  He continued to drink 4-5 beers a week and denied illicit/recreational drug use.  He was maintaining sinus rhythm.  He reported compliance with  current medication regimen without any missed doses of Eliquis .  Patient was restarted on losartan  at 12.5 mg daily.  He was continued on amiodarone  200 mg daily, Eliquis  5 mg twice daily, atorvastatin  40 mg daily, digoxin  62.5 mcg alternating with 125 mcg daily, Jardiance  10 mg daily, furosemide  40 mg daily, metoprolol  succinate 25 mg daily, and spironolactone  25 mg daily.  Patient was most recently seen by advanced heart failure 04/30/2023 and was overall doing well from a cardiac perspective.  He reported increased alcohol consumption since returning to work at 2 beers per day.  He reported compliance with this medication.  He was maintaining sinus rhythm.  Losartan  was increased to 25 mg daily.  Today, patient is doing well from a cardiac perspective.  He is without symptoms of angina and cardiac decompensation.  He reports that he recently developed a rash consistent with yeast infection in his genital area.  He told his pharmacist this and they recommended that he discontinue Jardiance . The rash subsequently resolved. He has self reduced his dose of furosemide  to 20 mg daily due to increased urinary frequency.  He states that he was unable to go to the bathroom that frequently due to his job.  He takes his blood pressure at home daily and reports that the systolic pressure is typically between 100-115 mmHg.  He endorses ongoing alcohol use of 4-5 beers per day and is aware that he should cut back.  He also acknowledges that his sodium intake is higher than it should be.  He denies recent illicit drug use.  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  He denies chest pain and shortness of breath with this.  Since reducing his dose of Lasix , he feels that his volume has been stable.  He endorses intermittent lower extremity swelling.  He otherwise denies lightheadedness, dizziness, orthopnea, and PND.  Labs independently reviewed: 04/30/2023-Hgb 14.9, HCT 45.9, BNP 16, BUN 27, CR 1.37,  NA 140, K4.6 03/19/2023-A1c 5.8, TSH wnl  Objective   Past Medical History:  Diagnosis Date   Alcohol abuse    Chronic pain    COVID-19    a. 02/2019   HFrEF (heart failure with reduced ejection fraction) (HCC)    a. 05/2019 Echo: EF 35-40%; b. 07/2021 Echo: EF 40-45%; c. 10/2022 Echo: EF 20-25%, glob HK, mildly reduced RV fxn, sev dil LA, mid-mod MR, mild AI.   History of medication noncompliance    Longstanding persistent atrial fibrillation (HCC)    a. Dx 03/2018; b. 05/2019 recurrent Afib in setting of PE. CHA2DS2VASc = 1-->Eliquis  later changed to xarelto ; b. 07/2019 s/p DCCV (150J); d. 08/2019 admit w/ AF RVR in settting of noncompliance - longstanding persistent afib since on bb/ccb rx.   NICM (nonischemic cardiomyopathy) (HCC)    a. 05/2019 Echo: EF 35-40%; b. 05/2019 MV: EF 32%, no ischemia; c. 07/2021 Echo: EF 40-45%; d. 10/2022 Echo: EF 20-25%, glob HK.   Pulmonary embolism (HCC)    a. 05/2019 CTA Chest: mild amt of PE w/in a lower lobe branch of RPA; b. 08/2019 CTA Chest: Small PE in RML and RLL PA.    Current Medications: Current  Meds  Medication Sig   acetaminophen  (TYLENOL ) 500 MG tablet Take 1,000 mg by mouth 2 (two) times daily as needed for moderate pain (pain score 4-6) or mild pain (pain score 1-3).   allopurinol (ZYLOPRIM) 300 MG tablet Take 300 mg by mouth daily.   amiodarone  (PACERONE ) 200 MG tablet Take 200 mg by mouth daily.   apixaban  (ELIQUIS ) 5 MG TABS tablet Take 1 tablet (5 mg total) by mouth 2 (two) times daily.   atorvastatin  (LIPITOR) 40 MG tablet Take 1 tablet (40 mg total) by mouth daily.   colchicine  0.6 MG tablet Take 1 tablet (0.6 mg total) by mouth daily as needed. (Patient taking differently: Take 0.6 mg by mouth daily.)   losartan  (COZAAR ) 25 MG tablet Take 1 tablet (25 mg total) by mouth daily.   metoprolol  succinate (TOPROL  XL) 25 MG 24 hr tablet Take 1 tablet (25 mg total) by mouth daily.   spironolactone  (ALDACTONE ) 25 MG tablet Take 1 tablet (25 mg  total) by mouth daily.   [DISCONTINUED] furosemide  (LASIX ) 40 MG tablet Take 20 mg by mouth daily.    Allergies:   Patient has no known allergies.   Social History   Socioeconomic History   Marital status: Married    Spouse name: Not on file   Number of children: Not on file   Years of education: Not on file   Highest education level: Not on file  Occupational History   Not on file  Tobacco Use   Smoking status: Never   Smokeless tobacco: Current    Types: Chew  Vaping Use   Vaping status: Never Used  Substance and Sexual Activity   Alcohol use: Yes    Alcohol/week: 7.0 standard drinks of alcohol    Types: 7 Cans of beer per week    Comment: He states he hasn't drank in the past 2 weeks but has been a heavy drinker in the past   Drug use: Not Currently   Sexual activity: Not on file  Other Topics Concern   Not on file  Social History Narrative   Lives locally w/ wife.  Does not routinely exercise.  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   Social Drivers of Corporate investment banker Strain: Not on file  Food Insecurity: No Food Insecurity (11/07/2022)   Hunger Vital Sign    Worried About Running Out of Food in the Last Year: Never true    Ran Out of Food in the Last Year: Never true  Transportation Needs: No Transportation Needs (11/07/2022)   PRAPARE - Administrator, Civil Service (Medical): No    Lack of Transportation (Non-Medical): No  Physical Activity: Not on file  Stress: Not on file  Social Connections: Not on file     Family History:  The patient's family history includes Bone cancer in his father; Diabetes in his sister; Stroke in his father.  ROS:   12-point review of systems is negative unless otherwise noted in the HPI.   EKGs/Other Studies Reviewed:    Studies reviewed were summarized above. The additional studies were reviewed today:  06/01/2023 Echo complete 1. Left ventricular ejection fraction, by estimation, is 45 to 50%. Left   ventricular ejection fraction by 3D volume is 52 %. The left ventricle has  mildly decreased function. The left ventricle demonstrates global  hypokinesis. The left ventricular  internal cavity size was mildly dilated. Left ventricular diastolic  parameters were normal. The average left ventricular global longitudinal  strain is -14.7 %. The global longitudinal strain is abnormal.   2. Right ventricular systolic function is normal. The right ventricular  size is normal. Tricuspid regurgitation signal is inadequate for assessing  PA pressure.   3. Left atrial size was mildly dilated.   4. The mitral valve is normal in structure. Mild mitral valve  regurgitation. No evidence of mitral stenosis.   5. The aortic valve is normal in structure. Aortic valve regurgitation is  trivial. No aortic stenosis is present.   6. The inferior vena cava is normal in size with greater than 50%  respiratory variability, suggesting right atrial pressure of 3 mmHg.   EKG:  EKG personally reviewed by me today EKG Interpretation Date/Time:  Friday August 21 2023 14:26:57 EDT Ventricular Rate:  79 PR Interval:  154 QRS Duration:  88 QT Interval:  350 QTC Calculation: 401 R Axis:   -17  Text Interpretation: Normal sinus rhythm Inferior infarct (cited on or before 30-Apr-2023) When compared with ECG of 21-Aug-2023 13:51, No significant change was found Confirmed by Gildardo Labrador ([id number]) on 08/21/2023 2:30:34 PM  PHYSICAL EXAM:    VS:  BP 110/70 (BP Location: Left Arm, Patient Position: Sitting, Cuff Size: Normal)   Pulse 79   Ht 5\' 9"  (1.753 m)   Wt 207 lb (93.9 kg)   SpO2 96%   BMI 30.57 kg/m   BMI: Body mass index is 30.57 kg/m.  Physical Exam Vitals and nursing note reviewed.  Constitutional:      Appearance: Normal appearance.  Cardiovascular:     Rate and Rhythm: Normal rate and regular rhythm.     Heart sounds: No murmur heard.    No gallop.  Pulmonary:     Effort: Pulmonary effort is normal.      Breath sounds: Normal breath sounds. No wheezing or rales.  Musculoskeletal:     Right lower leg: No edema.     Left lower leg: No edema.     Comments: Scabbing on left lower leg  Skin:    General: Skin is warm and dry.  Neurological:     General: No focal deficit present.     Mental Status: He is alert and oriented to person, place, and time. Mental status is at baseline.  Psychiatric:        Mood and Affect: Mood normal.        Behavior: Behavior normal.     Wt Readings from Last 3 Encounters:  08/21/23 207 lb (93.9 kg)  04/30/23 206 lb 9.6 oz (93.7 kg)  04/03/23 203 lb 9.6 oz (92.4 kg)        ASSESSMENT & PLAN:   Chronic systolic biventricular failure Nonischemic cardiomyopathy - Most recent echo 05/2023 with EF 45-50%, improved from prior EF of 20-25% 10/2022. Patient appears euvolemic and well compensated on exam. SGLT2i discontinued due to yeast infection.  Patient was unable to take 40 mg of Lasix  daily due to urinary frequency at work.  He reduced his dose to 20 mg of Lasix  daily and would prefer not to increase it.  I recommended an additional dose of Lasix  20 mg daily as needed for weight gain of 3 pounds in 1 day or 5 pounds in 1 week.  He is continued on metoprolol  succinate 25 mg daily, losartan  25 mg daily, and spironolactone  25 mg daily. Discussed limiting sodium and fluid intake.   Persistent atrial fibrillation - S/p DCCV in 02/2023.  Patient is maintaining sinus rhythm on amiodarone  200 mg  daily.  CHA2DS2-VASc Score = 2 [CHF History: 1, HTN History: 1, Diabetes History: 0, Stroke History: 0, Vascular Disease History: 0, Age Score: 0, Gender Score: 0]. Continue Eliquis  5 mg twice daily. Recommend referral to EP for consideration for ablation.  Hypertension - BP well controlled in office today and per at home readings.  Continue losartan  and metoprolol  as above.  Mitral regurgitation - Most recent echo 05/2023 with mild MR.  Continue to monitor with echo  periodically.  Polysubstance abuse - Patient continues to drink 4-5 beers daily.  Denies recent illicit drug use.  Alcohol cessation recommended.  Hx pulmonary embolism - Patient is continued on Eliquis  5 mg twice daily.  Hyperlipidemia - Most recent lipid panel 01/2023 with LDL 114.  Continue rosuvastatin  40 mg daily.   Disposition: F/u with Dr. Gollan or an APP in 3 month.   Medication Adjustments/Labs and Tests Ordered: Current medicines are reviewed at length with the patient today.  Concerns regarding medicines are outlined above. Medication changes, Labs and Tests ordered today are summarized above and listed in the Patient Instructions accessible in Encounters.   Signed, Gildardo Labrador, PA-C 08/21/2023 4:13 PM     [facility] [place] [address] [place], [place] [address] [phone]

## 2023-08-19 ENCOUNTER — Telehealth: Payer: Self-pay | Admitting: Family

## 2023-08-19 NOTE — Telephone Encounter (Signed)
 Called to sched f/u appt with Brian Campanile

## 2023-08-21 ENCOUNTER — Ambulatory Visit: Attending: Cardiology | Admitting: Physician Assistant

## 2023-08-21 VITALS — BP 110/70 | HR 79 | Ht 69.0 in | Wt 207.0 lb

## 2023-08-21 DIAGNOSIS — I5020 Unspecified systolic (congestive) heart failure: Secondary | ICD-10-CM

## 2023-08-21 DIAGNOSIS — I5082 Biventricular heart failure: Secondary | ICD-10-CM

## 2023-08-21 DIAGNOSIS — I340 Nonrheumatic mitral (valve) insufficiency: Secondary | ICD-10-CM

## 2023-08-21 DIAGNOSIS — I4811 Longstanding persistent atrial fibrillation: Secondary | ICD-10-CM | POA: Diagnosis not present

## 2023-08-21 DIAGNOSIS — F1910 Other psychoactive substance abuse, uncomplicated: Secondary | ICD-10-CM

## 2023-08-21 DIAGNOSIS — I10 Essential (primary) hypertension: Secondary | ICD-10-CM

## 2023-08-21 DIAGNOSIS — E785 Hyperlipidemia, unspecified: Secondary | ICD-10-CM

## 2023-08-21 DIAGNOSIS — I428 Other cardiomyopathies: Secondary | ICD-10-CM | POA: Diagnosis not present

## 2023-08-21 DIAGNOSIS — Z86711 Personal history of pulmonary embolism: Secondary | ICD-10-CM

## 2023-08-21 MED ORDER — FUROSEMIDE 20 MG PO TABS: 20.0000 mg | ORAL_TABLET | ORAL | 3 refills | Status: DC | PRN

## 2023-08-21 NOTE — Patient Instructions (Signed)
 Medication Instructions:  Your physician recommends the following medication changes.  CHANGE: Lasix  20 mg daily as needed for 3 lb wt gain in a day or 5 lb in a weeks *If you need a refill on your cardiac medications before your next appointment, please call your pharmacy*  Lab Work: No labs ordered today  If you have labs (blood work) drawn today and your tests are completely normal, you will receive your results only by: MyChart Message (if you have MyChart) OR A paper copy in the mail If you have any lab test that is abnormal or we need to change your treatment, we will call you to review the results.  Testing/Procedures: No labs ordered today   Follow-Up: At [facility], you and your health needs are our priority.  As part of our continuing mission to provide you with exceptional heart care, our providers are all part of one team.  This team includes your primary Cardiologist (physician) and Advanced Practice Providers or APPs (Physician Assistants and Nurse Practitioners) who all work together to provide you with the care you need, when you need it.  Your next appointment:   3 month(s)  Provider:   Timothy Gollan, MD    We recommend signing up for the patient portal called "MyChart".  Sign up information is provided on this After Visit Summary.  MyChart is used to connect with patients for Virtual Visits (Telemedicine).  Patients are able to view lab/test results, encounter notes, upcoming appointments, etc.  Non-urgent messages can be sent to your provider as well.   To learn more about what you can do with MyChart, go to [email or web address].   Other Instructions Referral to EP

## 2023-08-21 NOTE — Progress Notes (Signed)
 Pt was seen by [facility] today 08/21/23.

## 2023-09-04 NOTE — Progress Notes (Signed)
 Order(s) created erroneously. Erroneous order ID: [id number]  Order moved by: Jayne Mews  Order move date/time: 09/04/2023 2:48 PM  Source Patient: [id number]  Source Contact: 08/21/2023  Destination Patient: [id number]  Destination Contact: 08/27/2022

## 2023-09-28 NOTE — Progress Notes (Deleted)
  Electrophysiology Office Note:    Date:  09/28/2023   ID:  Calvin Esparza, DOB Nov 11, 1960, MRN [id number]  [facility] Cardiologist:  Calvin Lunger, MD  [facility] Electrophysiologist:  Calvin ONEIDA HOLTS, MD   Referring MD: Calvin Calvin CROME, PA-C   Chief Complaint: Atrial fibrillation  History of Present Illness:    Calvin Esparza is a 63 year old man who I am seeing today for an evaluation of atrial fibrillation at the request of Calvin Lorene, PA-C.  The patient has a history of nonischemic cardiomyopathy, persistent atrial fibrillation, pulmonary embolism, cocaine and alcohol abuse, medication regimen nonadherence, chronic pain and gout.  He was seen by MRI on August 21, 2023.  He was taking amiodarone  and Eliquis  for his atrial fibrillation.  He is being referred to discuss treatment options for his atrial fibrillation in hopes of avoiding long-term exposure to amiodarone .      Their past medical, social and family history was reviewed.   ROS:   Please see the history of present illness.    All other systems reviewed and are negative.  EKGs/Labs/Other Studies Reviewed:    The following studies were reviewed today:  June 01, 2023 echo EF 45-50 RV normal Mildly dilated left atrium Mild MR  October 27, 2022 left heart catheterization showed no significant coronary artery disease  August 21, 2023 EKG shows sinus rhythm.  Normal intervals.  QTc 401 ms.  February 19, 2023 EKG shows atrial fibrillation.  Ventricular rate 148 bpm      Physical Exam:    VS:  There were no vitals taken for this visit.    Wt Readings from Last 3 Encounters:  08/21/23 207 lb (93.9 kg)  04/30/23 206 lb 9.6 oz (93.7 kg)  04/03/23 203 lb 9.6 oz (92.4 kg)     GEN: no distress CARD: RRR, No MRG RESP: No IWOB. CTAB.        ASSESSMENT AND PLAN:    1. Polysubstance abuse (HCC)   2. Chronic systolic heart failure (HCC)   3. Persistent atrial fibrillation (HCC)   4. Encounter for  long-term (current) use of high-risk medication     #Persistent atrial fibrillation #High risk drug monitoring-amiodarone  The patient has a longstanding history of atrial fibrillation in the setting of alcohol use and chronic systolic heart failure.  He is maintaining sinus rhythm after cardioversion and amiodarone  use.  We discussed the link between his alcohol use and atrial arrhythmias during today's clinic appointment.  I discussed how I believe the quantity of alcohol consumed daily is prohibitive for catheter ablation.  I would recommend he cut back significantly on his alcohol intake.  If he is able to cut back on his alcohol intake, could revisit catheter ablation candidacy.  For now recommend continuing amiodarone  with close monitoring.  Check CMP, TSH and free T4 today  Continue Eliquis  for stroke prophylaxis  #Chronic systolic heart failure NYHA class II symptoms.  Warm and dry on exam.  Continue current GDMT.  Follow-up with general cardiology  Follow-up with the EP in 6 months or sooner if he is able to successfully cut back on his alcohol intake.   Signed, Calvin ONEIDA. HOLTS, MD, [facility], [facility] 09/28/2023 9:27 PM    Electrophysiology [facility]

## 2023-09-30 ENCOUNTER — Ambulatory Visit: Attending: Cardiology | Admitting: Cardiology

## 2023-09-30 DIAGNOSIS — Z79899 Other long term (current) drug therapy: Secondary | ICD-10-CM

## 2023-09-30 DIAGNOSIS — F1910 Other psychoactive substance abuse, uncomplicated: Secondary | ICD-10-CM

## 2023-09-30 DIAGNOSIS — I4819 Other persistent atrial fibrillation: Secondary | ICD-10-CM

## 2023-09-30 DIAGNOSIS — I5022 Chronic systolic (congestive) heart failure: Secondary | ICD-10-CM

## 2023-11-22 NOTE — Progress Notes (Deleted)
 No current cardiology Office Note  Date:  11/22/2023   ID:  Calvin Esparza, DOB 1960/11/12, MRN [id number]  PCP:  Lenon Layman ORN, MD   No chief complaint on file.   HPI:  [name] is a 63 year old gentleman with past medical history of Alcohol abuse Chronic pain, knee pain Recently seen in the emergency room August 28, 2018 for chest pain, dizzy, atrial fibrillation Heart rate from 110 up to 130 bpm CTA showing PE lower lobe branch on the right March 2021 Atrial fibrillation March 2021 COVID infection December 2020 Who presents for follow-up of his likely permanent atrial fibrillation  Last office visit April 2022  Last visit with one of our providers: diltiazem  held presumably for low ejection fraction Metoprolol  increased to 200 daily Reports rate has been running high  Complaining about pain in his legs, vein swelling, inside groins Tender right groin, hamstring Working as a Midwife at [employer], 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  On paper prescribed 40 every other day, reports taking it daily sometimes twice a day  EKG personally reviewed by myself on todays visit Atrial fibrillation rate 109  Other past medical history reviewed Last seen in clinic September 2021 At that time was in atrial fibrillation In the hospital February 25, 2020 with atrial fibrillation with RVR rate 177 bpm Secondary to dysphagia has not been taking his anticoagulation or any of his pills Had EGD showing multiple gastric erosions, portal gastropathy Started on PPI twice daily  Last Seen by myself 11/22 Seen by one of our providers June 2025  Was on metoprolol  succinate 100 twice daily and diltiazem  180 daily, Xarelto    working in Holiday representative, for cement all day, often in the hot basement,  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, High risk of dehydration with his work, previous hospital admissions for dehydration  Last Friday worked hard shoveling cement all day and he, then [employer] and used his weedeater to midnight, certainly dehydrated That night started having severe cramping, lasting for the next 2 days On and off for the past week Stopped torsemide  secondary to cramps Been trying to hydrate better since then  EKG personally reviewed by myself on todays visit Atrial fibrillation with rate 99 bpm   Other past medical history reviewed Seen in emergency room October 10, 2019 A. fib with RVR Admission to the hospital Heatstroke, dehydration Was given IV fluids, heart rate improved, felt better, no significant medication changes were made  Seen in clinic in October 14, 2019, atrial fibrillation at that time  Seen in emergency room December 15, 2019 for shortness of breath  not taking his diltiazem  for several days out of fear of interaction with colchicine  blood pressure is elevated 160/113 pulse rate 119 was given a dose of IV Lasix  and a dose of diltiazem , discharged home  Covid infection December 2020   PMH:   has a past medical history of Alcohol abuse, Chronic pain, COVID-19, HFrEF (heart failure with reduced ejection fraction) (HCC), History of medication noncompliance, Longstanding persistent atrial fibrillation (HCC), NICM (nonischemic cardiomyopathy) (HCC), and Pulmonary embolism (HCC).  PSH:    Past Surgical History:  Procedure Laterality Date   CARDIOVERSION N/A 07/29/2019   Procedure: CARDIOVERSION;  Surgeon: Perla Evalene PARAS, MD;  Location: [facility] ORS;  Service: Cardiovascular;  Laterality: N/A;   CARDIOVERSION N/A 03/03/2023  Procedure: CARDIOVERSION;  Surgeon: Cherrie Toribio SAUNDERS, MD;  Location: [facility] INVASIVE CV LAB;  Service: Cardiovascular;  Laterality: N/A;   COLONOSCOPY WITH PROPOFOL  N/A 05/24/2020   Procedure: COLONOSCOPY WITH PROPOFOL ;  Surgeon: Unk Corinn Skiff, MD;   Location: [facility] ENDOSCOPY;  Service: Gastroenterology;  Laterality: N/A;   ESOPHAGOGASTRODUODENOSCOPY (EGD) WITH PROPOFOL  N/A 02/27/2020   Procedure: ESOPHAGOGASTRODUODENOSCOPY (EGD) WITH PROPOFOL ;  Surgeon: Unk Corinn Skiff, MD;  Location: [facility] ENDOSCOPY;  Service: Gastroenterology;  Laterality: N/A;   ESOPHAGOGASTRODUODENOSCOPY (EGD) WITH PROPOFOL  N/A 05/24/2020   Procedure: ESOPHAGOGASTRODUODENOSCOPY (EGD) WITH PROPOFOL ;  Surgeon: Unk Corinn Skiff, MD;  Location: [facility] ENDOSCOPY;  Service: Gastroenterology;  Laterality: N/A;   RIGHT/LEFT HEART CATH AND CORONARY ANGIOGRAPHY N/A 10/27/2022   Procedure: RIGHT/LEFT HEART CATH AND CORONARY ANGIOGRAPHY;  Surgeon: Mady Bruckner, MD;  Location: [facility] INVASIVE CV LAB;  Service: Cardiovascular;  Laterality: N/A;    Current Outpatient Medications  Medication Sig Dispense Refill   acetaminophen  (TYLENOL ) 500 MG tablet Take 1,000 mg by mouth 2 (two) times daily as needed for moderate pain (pain score 4-6) or mild pain (pain score 1-3).     allopurinol (ZYLOPRIM) 300 MG tablet Take 300 mg by mouth daily.     amiodarone  (PACERONE ) 200 MG tablet Take 200 mg by mouth daily.     apixaban  (ELIQUIS ) 5 MG TABS tablet Take 1 tablet (5 mg total) by mouth 2 (two) times daily. 180 tablet 1   atorvastatin  (LIPITOR) 40 MG tablet Take 1 tablet (40 mg total) by mouth daily. 90 tablet 3   colchicine  0.6 MG tablet Take 1 tablet (0.6 mg total) by mouth daily as needed. (Patient taking differently: Take 0.6 mg by mouth daily.) 90 tablet 0   furosemide  (LASIX ) 20 MG tablet Take 1 tablet (20 mg total) by mouth as needed (for weight gain of 3 lbs in a day or 5 lbs in a week). 90 tablet 3   losartan  (COZAAR ) 25 MG tablet Take 1 tablet (25 mg total) by mouth daily. 90 tablet 3   metoprolol  succinate (TOPROL  XL) 25 MG 24 hr tablet Take 1 tablet (25 mg total) by mouth daily. 45 tablet 3   spironolactone  (ALDACTONE ) 25 MG tablet Take 1 tablet (25 mg total) by mouth daily. 45  tablet 3   No current facility-administered medications for this visit.    Allergies:   Patient has no known allergies.   Social History:  The patient  reports that he has never smoked. His smokeless tobacco use includes chew. He reports current alcohol use of about 7.0 standard drinks of alcohol per week. He reports that he does not currently use drugs.   Family History:   family history includes Bone cancer in his father; Diabetes in his sister; Stroke in his father.    Review of Systems: Review of Systems  Constitutional: Negative.   HENT: Negative.    Eyes: Negative.   Respiratory: Negative.    Cardiovascular: Negative.   Gastrointestinal: Negative.   Musculoskeletal: Negative.   Neurological: Negative.   Psychiatric/Behavioral: Negative.    All other systems reviewed and are negative.   PHYSICAL EXAM: VS:  There were no vitals taken for this visit. , BMI There is no height or weight on file to calculate BMI. Constitutional:  oriented to person, place, and time. No distress.  HENT:  Head: Grossly normal Eyes:  no discharge. No scleral icterus.  Neck: No JVD, no carotid bruits  Cardiovascular: Regular rate and rhythm, no murmurs appreciated Pulmonary/Chest: Clear to auscultation  bilaterally, no wheezes or rails Abdominal: Soft.  no distension.  no tenderness.  Musculoskeletal: Normal range of motion Neurological:  normal muscle tone. Coordination normal. No atrophy Skin: Skin warm and dry Psychiatric: normal affect, pleasant   Recent Labs: 04/30/2023: BNP 16.8; BUN 27; Creatinine, Ser 1.37; Hemoglobin 14.9; Platelets 214; Potassium 4.3; Sodium 140    Lipid Panel Lab Results  Component Value Date   CHOL 187 01/27/2023   HDL 50 01/27/2023   LDLCALC 114 (H) 01/27/2023   TRIG 132 01/27/2023      Wt Readings from Last 3 Encounters:  08/21/23 207 lb (93.9 kg)  04/30/23 206 lb 9.6 oz (93.7 kg)  04/03/23 203 lb 9.6 oz (92.4 kg)     ASSESSMENT AND  PLAN:  Problem List Items Addressed This Visit   None  Atrial fibrillation, persistent  metoprolol  succinate 200 twice daily,  Rate continues to run high without the diltiazem  Start digoxin  for rate control, check a level in 2-3 weeks On Xarelto   Essential hypertension Blood pressure is well controlled on today's visit. No changes made to the medications.  Pulmonary embolism On anticoagulation On Xarelto   Cardiomyopathy avoid alcohol In atrial fibrillation, permanent likely     Total encounter time more than 25 minutes  Greater than 50% was spent in counseling and coordination of care with the patient    Signed, Velinda Lunger, M.D., Ph.D. [facility] [place], [place] [phone]

## 2023-11-23 ENCOUNTER — Ambulatory Visit: Attending: Cardiovascular Disease | Admitting: Cardiovascular Disease

## 2023-11-23 DIAGNOSIS — I2699 Other pulmonary embolism without acute cor pulmonale: Secondary | ICD-10-CM

## 2023-11-23 DIAGNOSIS — I5022 Chronic systolic (congestive) heart failure: Secondary | ICD-10-CM

## 2023-11-23 DIAGNOSIS — I5020 Unspecified systolic (congestive) heart failure: Secondary | ICD-10-CM

## 2023-11-23 DIAGNOSIS — I340 Nonrheumatic mitral (valve) insufficiency: Secondary | ICD-10-CM

## 2023-11-23 DIAGNOSIS — I4811 Longstanding persistent atrial fibrillation: Secondary | ICD-10-CM

## 2023-11-23 DIAGNOSIS — I428 Other cardiomyopathies: Secondary | ICD-10-CM

## 2023-11-23 DIAGNOSIS — I5082 Biventricular heart failure: Secondary | ICD-10-CM

## 2023-11-23 DIAGNOSIS — F1910 Other psychoactive substance abuse, uncomplicated: Secondary | ICD-10-CM

## 2023-11-23 DIAGNOSIS — E785 Hyperlipidemia, unspecified: Secondary | ICD-10-CM

## 2023-11-23 DIAGNOSIS — I10 Essential (primary) hypertension: Secondary | ICD-10-CM

## 2023-12-02 ENCOUNTER — Ambulatory Visit: Admitting: Cardiology

## 2023-12-05 ENCOUNTER — Other Ambulatory Visit: Payer: Self-pay | Admitting: Physician Assistant

## 2023-12-05 ENCOUNTER — Other Ambulatory Visit: Payer: Self-pay | Admitting: Family

## 2023-12-07 NOTE — Telephone Encounter (Signed)
 Received refill request for patient. Patient is overdue for appointment.  Attempted to call patient to clarify how they are taking spironolactone . Our last OV note with patient documents a different dose than the refill request. Unable to reach.

## 2023-12-23 ENCOUNTER — Ambulatory Visit: Admitting: Cardiology

## 2024-01-05 ENCOUNTER — Encounter: Payer: Self-pay | Admitting: Cardiology

## 2024-01-05 ENCOUNTER — Other Ambulatory Visit: Payer: Self-pay

## 2024-01-05 ENCOUNTER — Ambulatory Visit: Attending: Cardiology | Admitting: Cardiology

## 2024-01-05 VITALS — BP 131/84 | HR 87 | Ht 69.0 in | Wt 204.0 lb

## 2024-01-05 DIAGNOSIS — Z79899 Other long term (current) drug therapy: Secondary | ICD-10-CM

## 2024-01-05 DIAGNOSIS — I5022 Chronic systolic (congestive) heart failure: Secondary | ICD-10-CM | POA: Diagnosis not present

## 2024-01-05 DIAGNOSIS — I4821 Permanent atrial fibrillation: Secondary | ICD-10-CM | POA: Diagnosis not present

## 2024-01-05 DIAGNOSIS — I428 Other cardiomyopathies: Secondary | ICD-10-CM | POA: Diagnosis not present

## 2024-01-05 DIAGNOSIS — D6869 Other thrombophilia: Secondary | ICD-10-CM | POA: Diagnosis not present

## 2024-01-05 DIAGNOSIS — I4819 Other persistent atrial fibrillation: Secondary | ICD-10-CM | POA: Diagnosis not present

## 2024-01-05 NOTE — Progress Notes (Unsigned)
 Electrophysiology Office Note:   Date:  01/07/2024  ID:  SAYED APOSTOL, DOB 1960-11-26, MRN [id number]  Primary Cardiologist: Evalene Lunger, MD Electrophysiologist: [name], MD      History of Present Illness:   Calvin Esparza is a 63 y.o. male with h/o chronic systolic heart failure secondary nonischemic cardiomyopathy, persistent atrial fibrillation who is being seen today for AF management.  Discussed the use of AI scribe software for clinical note transcription with the patient, who gave verbal consent to proceed.  History of Present Illness Calvin Esparza is a 63 year old male with atrial fibrillation who presents for evaluation of medication alternatives. He was referred by his personal doctor for evaluation of alternatives to amiodarone .  He has a history of atrial fibrillation and has been managed with amiodarone . He wishes to discontinue amiodarone  due to concerns about its long-term effects. Currently, he is on amiodarone  to maintain normal heart rhythm but is interested in exploring other management options.  He has a history of heart failure and has been seen in the heart failure clinic. He is currently taking Eliquis  and reports no bleeding issues, such as blood in the stool, urine, or coughing up blood.  He has experienced high heart rates, reaching 147-150 beats per minute, causing fatigue and stress on his heart. He manages fluid retention issues with a half dose of a diuretic, as a full dose causes lower back pain.  He has a history of working in Chiropractor for 30 years, which he notes has taken a toll on his body. He is working on minimizing alcohol consumption, previously consuming two to four beers a day.    Review of systems complete and found to be negative unless listed in HPI.   EP Information / Studies Reviewed:    EKG is ordered today. Personal review as below.  EKG Interpretation Date/Time:  Tuesday January 05 2024 15:35:15  EDT Ventricular Rate:  87 PR Interval:  150 QRS Duration:  86 QT Interval:  396 QTC Calculation: 476 R Axis:   18  Text Interpretation: Normal sinus rhythm Nonspecific ST and T wave abnormality When compared with ECG of 21-Aug-2023 14:26, Nonspecific T wave abnormality, improved in Lateral leads Confirmed by Kitty Fonda [phone]) on 01/05/2024 4:05:23 PM   Echo 06/01/2023:  1. Left ventricular ejection fraction, by estimation, is 45 to 50%. Left  ventricular ejection fraction by 3D volume is 52 %. The left ventricle has  mildly decreased function. The left ventricle demonstrates global  hypokinesis. The left ventricular  internal cavity size was mildly dilated. Left ventricular diastolic  parameters were normal. The average left ventricular global longitudinal  strain is -14.7 %. The global longitudinal strain is abnormal.   2. Right ventricular systolic function is normal. The right ventricular  size is normal. Tricuspid regurgitation signal is inadequate for assessing  PA pressure.   3. Left atrial size was mildly dilated.   4. The mitral valve is normal in structure. Mild mitral valve  regurgitation. No evidence of mitral stenosis.   5. The aortic valve is normal in structure. Aortic valve regurgitation is  trivial. No aortic stenosis is present.   6. The inferior vena cava is normal in size with greater than 50%  respiratory variability, suggesting right atrial pressure of 3 mmHg.   Physical Exam:   VS:  BP 131/84   Pulse 87   Ht 5' 9 (1.753 m)   Wt 204 lb (92.5 kg)   SpO2 96%  BMI 30.13 kg/m    Wt Readings from Last 3 Encounters:  01/05/24 204 lb (92.5 kg)  08/21/23 207 lb (93.9 kg)  04/30/23 206 lb 9.6 oz (93.7 kg)     GEN: Well nourished, well developed in no acute distress NECK: No JVD CARDIAC: Normal rate, regular rhythm RESPIRATORY:  Clear to auscultation without rales, wheezing or rhonchi  ABDOMEN: Soft, non-distended EXTREMITIES:  No edema; No deformity    ASSESSMENT AND PLAN:    #Persistent atrial fibrillation: In setting of systolic heart failure. For this reason we have prioritized a rhythm control strategy. #High risk medication use: Amiodarone .  -Discussed treatment options today for AF including antiarrhythmic drug therapy and ablation. Discussed risks, recovery and likelihood of success with each treatment strategy. Risk, benefits, and alternatives to EP study and ablation for afib were discussed. These risks include but are not limited to stroke, bleeding, vascular damage, tamponade, perforation, damage to the esophagus, lungs, phrenic nerve and other structures, pulmonary vein stenosis, worsening renal function, coronary vasospasm and death.  Discussed potential need for repeat ablation procedures and antiarrhythmic drugs after an initial ablation. The patient understands these risk and wishes to proceed.  We will therefore proceed with catheter ablation at the next available time.  Carto, ICE, anesthesia are requested for the procedure.   -Continue amiodarone  200mg  daily.  -Continue metoprolol  XL 12.5mg  daily.  #Hypercoagulable state due to AF:  -Continue Eliquis  5mg  BID. No bleeding issues.   #Chronic systolic biventricular failure: Well compensated today. #Nonischemic cardiomyopathy - Rhythm control strategy as above.  -Continue GDMT regimen of losartan  25 mg daily, taupe XL 12.5 mg daily, spironolactone  12.5 mg daily.  Close follow-up with HF clinic.   Follow up with Dr. Kennyth 3 months after catheter ablation.   Signed, Fonda Kennyth, MD

## 2024-01-05 NOTE — Patient Instructions (Addendum)
 Medication Instructions:  Your physician recommends that you continue on your current medications as directed. Please refer to the Current Medication list given to you today.  *If you need a refill on your cardiac medications before your next appointment, please call your pharmacy*  Testing/Procedures: Ablation Your physician has recommended that you have an ablation. Catheter ablation is a medical procedure used to treat some cardiac arrhythmias (irregular heartbeats). During catheter ablation, a long, thin, flexible tube is put into a blood vessel in your groin (upper thigh), or neck. This tube is called an ablation catheter. It is then guided to your heart through the blood vessel. Radio frequency waves destroy small areas of heart tissue where abnormal heartbeats may cause an arrhythmia to start.   You are scheduled for Atrial Fibrillation Ablation on Tuesday, December 23rd with Dr. Dr. Kennyth. Please arrive at the Main Entrance A at [facility]: [address] [place], [place] [address] at 9:30am  What To Expect:  Labs: you will need to have lab work drawn within 30 days of your procedure. Please go to any [facility] location to have these drawn - no appointment is needed. You will receive procedure instructions either through MyChart or in the mail 4-6 week prior to your procedure.  After your procedure we recommend no driving for 4 days, no lifting over 5 lbs for 7 days, and no work or strenuous activity for 7 days.  Please contact our office at [phone] if you have any questions.    Follow-Up: We will contact you to schedule your post-procedure appointments.

## 2024-02-09 ENCOUNTER — Telehealth: Payer: Self-pay

## 2024-02-09 NOTE — Telephone Encounter (Signed)
-----   Message from Nurse Carlyle C sent at 01/05/2024  4:18 PM EDT ----- Regarding: 03/08/24 afib ablation Precert:  MD: Kennyth Type of ablation: A-fib Diagnosis: A-fib CPT code: A-fib (06343) Ablation scheduled (date/time): 12/23 at 9:30am  Procedure:  Added to calendar? Yes Orders entered? Yes Letter complete? No, >30 days before procedure Scheduled with cath lab? Yes Any medications to hold? No Labs ordered (CBC, BMET, PT/INR if on warfarin): Yes Mapping system: Doesn't matter CARTO/OPAL rep notified? No Cardiac CT needed? No Dye allergy? No Pre-meds ordered and instructions given? No, not needed Letter method: Mailed H&P: 10/21 Device: No  Follow-up:  Cassie/Angel, please schedule Routine.  Covering RN - please send this message to [employer], EP scheduler, EP Scheduling pool, EP [employer], and CT scheduler (Brittany Lynch/Stephanie Mogg), if indicated.

## 2024-02-16 ENCOUNTER — Telehealth (HOSPITAL_COMMUNITY): Payer: Self-pay

## 2024-02-16 NOTE — Telephone Encounter (Signed)
 Attempted to reach patient to discuss upcoming procedure, no answer. Left VM for patient to return call.

## 2024-02-22 NOTE — Telephone Encounter (Signed)
 Spoke with patient to complete pre-procedure call.     Health status review:  Any new medical conditions, recent signs of acute illness or been started on antibiotics? No Any recent hospitalizations or surgeries? No Any new medications started since pre-op visit? No  Follow all medication instructions prior to procedure or the procedure may be rescheduled:    Continue taking Eliquis  (Apixaban ) twice daily without missing any doses before procedure. Essential chronic medications:  No medication should be continued, unless told otherwise. On the morning of your procedure DO NOT take any medication., including Eliquis  (Apixaban ).  Nothing to eat or drink after midnight prior to your procedure.  Pre-procedure testing scheduled: CT not required and lab work completed today.  Confirmed patient is scheduled for Atrial Fibrillation Ablation on Tuesday, December 23 with Dr. Kennyth. Instructed patient to arrive at the Main Entrance A at [facility]: [address] [place], [place] [address] and check in at Admitting at 7:30 AM.  Plan to go home the same day, you will only stay overnight if medically necessary. You MUST have a responsible adult to drive you home and MUST be with you the first 24 hours after you arrive home or your procedure could be cancelled.  Informed a nurse may call a day before the procedure to confirm arrival time and ensure instructions are followed.  Patient verbalized understanding to information provided and is agreeable to proceed with procedure.   Advised to contact RN Navigator at [phone], to inform of any new medications started after call or concerns prior to procedure.

## 2024-02-23 LAB — BASIC METABOLIC PANEL WITH GFR
BUN/Creatinine Ratio: 11 (ref 10–24)
BUN: 11 mg/dL (ref 8–27)
CO2: 28 mmol/L (ref 20–29)
Calcium: 9.2 mg/dL (ref 8.6–10.2)
Chloride: 103 mmol/L (ref 96–106)
Creatinine, Ser: 0.97 mg/dL (ref 0.76–1.27)
Glucose: 109 mg/dL — ABNORMAL HIGH (ref 70–99)
Potassium: 3.6 mmol/L (ref 3.5–5.2)
Sodium: 145 mmol/L — ABNORMAL HIGH (ref 134–144)
eGFR: 89 mL/min/1.73 (ref >59)

## 2024-02-23 LAB — CBC
Hematocrit: 40.2 % (ref 37.5–51.0)
Hemoglobin: 12.8 g/dL — ABNORMAL LOW (ref 13.0–17.7)
MCH: 32.2 pg (ref 26.6–33.0)
MCHC: 31.8 g/dL (ref 31.5–35.7)
MCV: 101 fL — ABNORMAL HIGH (ref 79–97)
Platelets: 195 x10E3/uL (ref 150–450)
RBC: 3.97 x10E6/uL — ABNORMAL LOW (ref 4.14–5.80)
RDW: 13.8 % (ref 11.6–15.4)
WBC: 5.4 x10E3/uL (ref 3.4–10.8)

## 2024-03-05 ENCOUNTER — Ambulatory Visit: Payer: Self-pay | Admitting: Cardiology

## 2024-03-07 NOTE — Pre-Procedure Instructions (Signed)
 Attempted to call patient regarding procedure instructions.  Left voicemail on the following items: Arrival time 0730 Nothing to eat or drink after midnight No meds AM of procedure Responsible person to drive you home and stay with you for 24 hrs  Have you missed any doses of anti-coagulant Eliquis- should be taken twice a day, if you have missed any doses please let us know.  Don't take dose morning of procedure.

## 2024-03-08 ENCOUNTER — Encounter (HOSPITAL_COMMUNITY): Payer: Self-pay | Admitting: Cardiology

## 2024-03-08 ENCOUNTER — Ambulatory Visit (HOSPITAL_COMMUNITY): Admitting: Anesthesiology

## 2024-03-08 ENCOUNTER — Encounter (HOSPITAL_COMMUNITY): Admission: RE | Payer: Self-pay

## 2024-03-08 ENCOUNTER — Ambulatory Visit (HOSPITAL_COMMUNITY)
Admission: RE | Admit: 2024-03-08 | Discharge: 2024-03-08 | Disposition: A | Discharge status: Home / Self Care | Attending: Cardiology | Admitting: Cardiology

## 2024-03-08 ENCOUNTER — Other Ambulatory Visit: Payer: Self-pay

## 2024-03-08 DIAGNOSIS — I428 Other cardiomyopathies: Secondary | ICD-10-CM | POA: Insufficient documentation

## 2024-03-08 DIAGNOSIS — I4819 Other persistent atrial fibrillation: Secondary | ICD-10-CM

## 2024-03-08 DIAGNOSIS — I5022 Chronic systolic (congestive) heart failure: Secondary | ICD-10-CM | POA: Diagnosis not present

## 2024-03-08 DIAGNOSIS — I110 Hypertensive heart disease with heart failure: Secondary | ICD-10-CM

## 2024-03-08 DIAGNOSIS — Z01818 Encounter for other preprocedural examination: Secondary | ICD-10-CM

## 2024-03-08 DIAGNOSIS — D6869 Other thrombophilia: Secondary | ICD-10-CM | POA: Diagnosis not present

## 2024-03-08 DIAGNOSIS — E785 Hyperlipidemia, unspecified: Secondary | ICD-10-CM | POA: Insufficient documentation

## 2024-03-08 DIAGNOSIS — Z79899 Other long term (current) drug therapy: Secondary | ICD-10-CM | POA: Insufficient documentation

## 2024-03-08 DIAGNOSIS — I5023 Acute on chronic systolic (congestive) heart failure: Secondary | ICD-10-CM | POA: Diagnosis not present

## 2024-03-08 DIAGNOSIS — Z7901 Long term (current) use of anticoagulants: Secondary | ICD-10-CM | POA: Insufficient documentation

## 2024-03-08 HISTORY — PX: ATRIAL FIBRILLATION ABLATION: EP1191

## 2024-03-08 LAB — CBC
HCT: 39.9 % (ref 39.0–52.0)
Hemoglobin: 13.4 g/dL (ref 13.0–17.0)
MCH: 33.2 pg (ref 26.0–34.0)
MCHC: 33.6 g/dL (ref 30.0–36.0)
MCV: 98.8 fL (ref 80.0–100.0)
Platelets: 163 K/uL (ref 150–400)
RBC: 4.04 MIL/uL — ABNORMAL LOW (ref 4.22–5.81)
RDW: 14.4 % (ref 11.5–15.5)
WBC: 5.5 K/uL (ref 4.0–10.5)
nRBC: 0 % (ref 0.0–0.2)

## 2024-03-08 SURGERY — ATRIAL FIBRILLATION ABLATION
Anesthesia: General

## 2024-03-08 MED ORDER — ATROPINE SULFATE 1 MG/10ML IJ SOSY
PREFILLED_SYRINGE | INTRAMUSCULAR | Status: AC
  Filled 2024-03-08: qty 10

## 2024-03-08 MED ORDER — PHENYLEPHRINE HCL-NACL 20-0.9 MG/250ML-% IV SOLN
INTRAVENOUS | Status: DC | PRN
  Administered 2024-03-08: 40 ug via INTRAVENOUS
  Administered 2024-03-08: 80 ug via INTRAVENOUS
  Administered 2024-03-08: 20 ug/min via INTRAVENOUS

## 2024-03-08 MED ORDER — SODIUM CHLORIDE 0.9 % IV SOLN: INTRAVENOUS | Status: DC

## 2024-03-08 MED ORDER — ACETAMINOPHEN 325 MG PO TABS
650.0000 mg | ORAL_TABLET | ORAL | Status: DC | PRN
  Administered 2024-03-08: 650 mg via ORAL
  Filled 2024-03-08: qty 2

## 2024-03-08 MED ORDER — PROPOFOL 10 MG/ML IV BOLUS
INTRAVENOUS | Status: DC | PRN
  Administered 2024-03-08: 100 mg via INTRAVENOUS
  Administered 2024-03-08 (×2): 50 mg via INTRAVENOUS

## 2024-03-08 MED ORDER — ONDANSETRON HCL 4 MG/2ML IJ SOLN: 4.0000 mg | Freq: Four times a day (QID) | INTRAMUSCULAR | Status: DC | PRN

## 2024-03-08 MED ORDER — ATROPINE SULFATE 1 MG/10ML IJ SOSY
PREFILLED_SYRINGE | INTRAMUSCULAR | Status: DC | PRN
  Administered 2024-03-08: 1 mg via INTRAVENOUS

## 2024-03-08 MED ORDER — FENTANYL CITRATE (PF) 100 MCG/2ML IJ SOLN
INTRAMUSCULAR | Status: AC
  Filled 2024-03-08: qty 2

## 2024-03-08 MED ORDER — SODIUM CHLORIDE 0.9% FLUSH: 3.0000 mL | Freq: Two times a day (BID) | INTRAVENOUS | Status: DC

## 2024-03-08 MED ORDER — ONDANSETRON HCL 4 MG/2ML IJ SOLN
INTRAMUSCULAR | Status: DC | PRN
  Administered 2024-03-08: 4 mg via INTRAVENOUS

## 2024-03-08 MED ORDER — MIDAZOLAM HCL (PF) 2 MG/2ML IJ SOLN
INTRAMUSCULAR | Status: DC | PRN
  Administered 2024-03-08: 2 mg via INTRAVENOUS

## 2024-03-08 MED ORDER — PROTAMINE SULFATE 10 MG/ML IV SOLN
INTRAVENOUS | Status: DC | PRN
  Administered 2024-03-08: 35 mg via INTRAVENOUS

## 2024-03-08 MED ORDER — LIDOCAINE 2% (20 MG/ML) 5 ML SYRINGE
INTRAMUSCULAR | Status: DC | PRN
  Administered 2024-03-08: 100 mg via INTRAVENOUS

## 2024-03-08 MED ORDER — SODIUM CHLORIDE 0.9% FLUSH: 3.0000 mL | INTRAVENOUS | Status: DC | PRN

## 2024-03-08 MED ORDER — DEXAMETHASONE SOD PHOSPHATE PF 10 MG/ML IJ SOLN
INTRAMUSCULAR | Status: DC | PRN
  Administered 2024-03-08: 10 mg via INTRAVENOUS

## 2024-03-08 MED ORDER — APIXABAN 5 MG PO TABS
5.0000 mg | ORAL_TABLET | Freq: Once | ORAL | Status: AC
  Administered 2024-03-08: 5 mg via ORAL
  Filled 2024-03-08: qty 1

## 2024-03-08 MED ORDER — SODIUM CHLORIDE 0.9 % IV SOLN: 250.0000 mL | INTRAVENOUS | Status: DC | PRN

## 2024-03-08 MED ORDER — FENTANYL CITRATE (PF) 250 MCG/5ML IJ SOLN
INTRAMUSCULAR | Status: DC | PRN
  Administered 2024-03-08 (×2): 50 ug via INTRAVENOUS

## 2024-03-08 MED ORDER — MIDAZOLAM HCL 2 MG/2ML IJ SOLN
INTRAMUSCULAR | Status: AC
  Filled 2024-03-08: qty 2

## 2024-03-08 MED ORDER — HEPARIN SODIUM (PORCINE) 1000 UNIT/ML IJ SOLN
INTRAMUSCULAR | Status: DC | PRN
  Administered 2024-03-08: 15000 [IU] via INTRAVENOUS

## 2024-03-08 MED ORDER — ROCURONIUM BROMIDE 10 MG/ML (PF) SYRINGE
PREFILLED_SYRINGE | INTRAVENOUS | Status: DC | PRN
  Administered 2024-03-08: 70 mg via INTRAVENOUS

## 2024-03-08 MED ORDER — HEPARIN (PORCINE) IN NACL 1000-0.9 UT/500ML-% IV SOLN
INTRAVENOUS | Status: DC | PRN
  Administered 2024-03-08 (×3): 500 mL

## 2024-03-08 MED ORDER — SUGAMMADEX SODIUM 200 MG/2ML IV SOLN
INTRAVENOUS | Status: DC | PRN
  Administered 2024-03-08: 200 mg via INTRAVENOUS

## 2024-03-08 SURGICAL SUPPLY — 18 items
BLANKET WARM UNDERBOD FULL ACC (MISCELLANEOUS) ×1 IMPLANT
CABLE FARASTAR GEN2 SNGL USE (CABLE) IMPLANT
CATH BI DIR 7FR CS F-J 12 PIN (CATHETERS) IMPLANT
CATH FARAWAVE 2.0 31 (CATHETERS) IMPLANT
CATH GE 8FR SOUNDSTAR (CATHETERS) IMPLANT
CATH OCTARAY 2.0 F 3-3-3-3-3 (CATHETERS) IMPLANT
CLOSURE PERCLOSE PROSTYLE (Vascular Products) IMPLANT
COVER SWIFTLINK CONNECTOR (BAG) ×1 IMPLANT
DILATOR VESSEL 38 20CM 16FR (INTRODUCER) IMPLANT
GUIDEWIRE INQWIRE 1.5J.035X260 (WIRE) IMPLANT
KIT VERSACROSS CNCT FARADRIVE (KITS) IMPLANT
PACK EP LF (CUSTOM PROCEDURE TRAY) ×1 IMPLANT
PAD DEFIB RADIO PHYSIO CONN (PAD) ×1 IMPLANT
PATCH CARTO3 (PAD) IMPLANT
SHEATH FARADRIVE STEERABLE (SHEATH) IMPLANT
SHEATH PINNACLE 8F 10CM (SHEATH) IMPLANT
SHEATH PINNACLE 9F 10CM (SHEATH) IMPLANT
SHEATH PROBE COVER 6X72 (BAG) IMPLANT

## 2024-03-08 NOTE — Transfer of Care (Signed)
 Immediate Anesthesia Transfer of Care Note  Patient: Calvin Esparza  Procedure(s) Performed: ATRIAL FIBRILLATION ABLATION  Patient Location: PACU and Cath Lab  Anesthesia Type:General  Level of Consciousness: awake, alert , oriented, and patient cooperative  Airway & Oxygen Therapy: Patient Spontanous Breathing  Post-op Assessment: Report given to RN and Post -op Vital signs reviewed and stable  Post vital signs: Reviewed and stable  Last Vitals:  Vitals Value Taken Time  BP 147/102 03/08/24 12:33  Temp    Pulse 99 03/08/24 12:36  Resp 26 03/08/24 12:36  SpO2 95 % 03/08/24 12:36  Vitals shown include unfiled device data.  Last Pain:  Vitals:   03/08/24 0726  TempSrc: Oral         Complications: No notable events documented.

## 2024-03-08 NOTE — Anesthesia Preprocedure Evaluation (Addendum)
 "                                  Anesthesia Evaluation  Patient identified by MRN, date of birth, ID band Patient awake    Reviewed: Allergy & Precautions, NPO status , Patient's Chart, lab work & pertinent test results, reviewed documented beta blocker date and time   Airway Mallampati: II  TM Distance: >3 FB Neck ROM: Full    Dental no notable dental hx. (+) Teeth Intact, Dental Advisory Given, Caps, Missing   Pulmonary neg pulmonary ROS   Pulmonary exam normal breath sounds clear to auscultation       Cardiovascular hypertension, Pt. on medications and Pt. on home beta blockers +CHF  Normal cardiovascular exam+ dysrhythmias Atrial Fibrillation  Rhythm:Regular Rate:Normal  S/P DCCV x 2 2021, 2024 On amiodarone   Echo 06/01/23 1. Left ventricular ejection fraction, by estimation, is 45 to 50%. Left  ventricular ejection fraction by 3D volume is 52 %. The left ventricle has  mildly decreased function. The left ventricle demonstrates global  hypokinesis. The left ventricular  internal cavity size was mildly dilated. Left ventricular diastolic  parameters were normal. The average left ventricular global longitudinal  strain is -14.7 %. The global longitudinal strain is abnormal.   2. Right ventricular systolic function is normal. The right ventricular  size is normal. Tricuspid regurgitation signal is inadequate for assessing  PA pressure.   3. Left atrial size was mildly dilated.   4. The mitral valve is normal in structure. Mild mitral valve  regurgitation. No evidence of mitral stenosis.   5. The aortic valve is normal in structure. Aortic valve regurgitation is  trivial. No aortic stenosis is present.   6. The inferior vena cava is normal in size with greater than 50%  respiratory variability, suggesting right atrial pressure of 3 mmHg.   EKG 01/05/24 NSR, non specific ST- T wave changes  Cardiac Cath 10/27/22 1. No angiographically significant coronary  artery disease. 2. Upper normal left and right heart filling pressures. 3. Severely reduced Fick cardiac output/index.  Low PAPi suggests a component of right heart failure as well.      Neuro/Psych  PSYCHIATRIC DISORDERS      negative neurological ROS     GI/Hepatic ,,,(+)     substance abuse  alcohol useHx/o esophageal lesion   Endo/Other  HLD Gout  Renal/GU negative Renal ROS  Chemistry         Component                Value               Date/Time                 NA                       145 (H)             02/22/2024 0958           K                        3.6                 02/22/2024 0958           CL  103                 02/22/2024 0958           CO2                      28                  02/22/2024 0958           BUN                      11                  02/22/2024 0958           CREATININE               0.97                02/22/2024 0958             Component                Value               Date/Time                 CALCIUM                   9.2                 02/22/2024 0958           ALKPHOS                  62                  07/10/2020 0845           AST                      30                  07/10/2020 0845           ALT                      23                  07/10/2020 0845           BILITOT                  <0.2                07/10/2020 0845         negative genitourinary   Musculoskeletal  (+) Arthritis ,    Abdominal  (+) + obese  Peds  Hematology Eliquis  therapy- last dose yesterday PM Lab Results      Component                Value               Date                      WBC                      5.5                 03/08/2024  HGB                      13.4                03/08/2024                HCT                      39.9                03/08/2024                MCV                      98.8                03/08/2024                PLT                      163                 03/08/2024               Anesthesia Other Findings   Reproductive/Obstetrics                              Anesthesia Physical Anesthesia Plan  ASA: 3  Anesthesia Plan: General   Post-op Pain Management: Minimal or no pain anticipated   Induction: Intravenous  PONV Risk Score and Plan: 2 and Treatment may vary due to age or medical condition  Airway Management Planned: Oral ETT  Additional Equipment: None  Intra-op Plan:   Post-operative Plan: Extubation in OR  Informed Consent: I have reviewed the patients History and Physical, chart, labs and discussed the procedure including the risks, benefits and alternatives for the proposed anesthesia with the patient or authorized representative who has indicated his/her understanding and acceptance.     Dental advisory given  Plan Discussed with: Anesthesiologist and CRNA  Anesthesia Plan Comments:          Anesthesia Quick Evaluation  "

## 2024-03-08 NOTE — Discharge Instructions (Addendum)

## 2024-03-08 NOTE — Anesthesia Postprocedure Evaluation (Signed)
"   Anesthesia Post Note  Patient: Calvin Esparza  Procedure(s) Performed: ATRIAL FIBRILLATION ABLATION     Patient location during evaluation: Phase II Anesthesia Type: General Level of consciousness: awake Pain management: pain level controlled Vital Signs Assessment: post-procedure vital signs reviewed and stable Respiratory status: spontaneous breathing Cardiovascular status: stable Postop Assessment: no apparent nausea or vomiting Anesthetic complications: no   No notable events documented.  Last Vitals:  Vitals:   03/08/24 1349 03/08/24 1400  BP: (!) 174/95 (!) 175/99  Pulse: 75 79  Resp: 17 16  Temp:    SpO2: 97% 97%                  Isayah Ignasiak T Colhoun      "

## 2024-03-08 NOTE — H&P (Signed)
 "  Electrophysiology Note:   Date:  03/08/24  ID:  Calvin Esparza, DOB 06-27-1960, MRN [id number]   Primary Cardiologist: Evalene Lunger, MD Electrophysiologist: [name], MD       History of Present Illness:   Calvin Esparza is a 62 y.o. male with h/o chronic systolic heart failure secondary nonischemic cardiomyopathy, persistent atrial fibrillation who is being seen today for AF management.   Discussed the use of AI scribe software for clinical note transcription with the patient, who gave verbal consent to proceed.   History of Present Illness Calvin Esparza is a 63 year old male with atrial fibrillation who presents for evaluation of medication alternatives. He was referred by his personal doctor for evaluation of alternatives to amiodarone .   He has a history of atrial fibrillation and has been managed with amiodarone . He wishes to discontinue amiodarone  due to concerns about its long-term effects. Currently, he is on amiodarone  to maintain normal heart rhythm but is interested in exploring other management options.   He has a history of heart failure and has been seen in the heart failure clinic. He is currently taking Eliquis  and reports no bleeding issues, such as blood in the stool, urine, or coughing up blood.   He has experienced high heart rates, reaching 147-150 beats per minute, causing fatigue and stress on his heart. He manages fluid retention issues with a half dose of a diuretic, as a full dose causes lower back pain.   He has a history of working in chiropractor for 30 years, which he notes has taken a toll on his body. He is working on minimizing alcohol consumption, previously consuming two to four beers a day.    Interval: Patient presents today for planned ablation. Reports feeling relatively well. No new or acute complaints.    Review of systems complete and found to be negative unless listed in HPI.    EP Information / Studies Reviewed:      EKG  Interpretation Date/Time:                  Tuesday January 05 2024 15:35:15 EDT Ventricular Rate:         87 PR Interval:                 150 QRS Duration:             86 QT Interval:                 396 QTC Calculation:476 R Axis:                         18   Text Interpretation:Normal sinus rhythm Nonspecific ST and T wave abnormality When compared with ECG of 21-Aug-2023 14:26, Nonspecific T wave abnormality, improved in Lateral leads Confirmed by Kitty Fonda [phone]) on 01/05/2024 4:05:23 PM    Echo 06/01/2023:  1. Left ventricular ejection fraction, by estimation, is 45 to 50%. Left  ventricular ejection fraction by 3D volume is 52 %. The left ventricle has  mildly decreased function. The left ventricle demonstrates global  hypokinesis. The left ventricular  internal cavity size was mildly dilated. Left ventricular diastolic  parameters were normal. The average left ventricular global longitudinal  strain is -14.7 %. The global longitudinal strain is abnormal.   2. Right ventricular systolic function is normal. The right ventricular  size is normal. Tricuspid regurgitation signal is inadequate for assessing  PA pressure.  3. Left atrial size was mildly dilated.   4. The mitral valve is normal in structure. Mild mitral valve  regurgitation. No evidence of mitral stenosis.   5. The aortic valve is normal in structure. Aortic valve regurgitation is  trivial. No aortic stenosis is present.   6. The inferior vena cava is normal in size with greater than 50%  respiratory variability, suggesting right atrial pressure of 3 mmHg.    Physical Exam:    Today's Vitals   03/08/24 0726  BP: (!) 182/93  Pulse: 76  Resp: 20  Temp: 98.2 F (36.8 C)  TempSrc: Oral  SpO2: 99%  Weight: 93.4 kg  Height: 5' 9 (1.753 m)   Body mass index is 30.42 kg/m.   GEN: Well nourished, well developed in no acute distress NECK: No JVD CARDIAC: Normal rate, regular rhythm RESPIRATORY:  Clear to  auscultation without rales, wheezing or rhonchi  ABDOMEN: Soft, non-distended EXTREMITIES:  No edema; No deformity    ASSESSMENT AND PLAN:     #Persistent atrial fibrillation: In setting of systolic heart failure. For this reason we have prioritized a rhythm control strategy. #High risk medication use: Amiodarone .  -Discussed treatment options today for AF including antiarrhythmic drug therapy and ablation. Discussed risks, recovery and likelihood of success with each treatment strategy. Risk, benefits, and alternatives to EP study and ablation for afib were discussed. These risks include but are not limited to stroke, bleeding, vascular damage, tamponade, perforation, damage to the esophagus, lungs, phrenic nerve and other structures, pulmonary vein stenosis, worsening renal function, coronary vasospasm and death.  Discussed potential need for repeat ablation procedures and antiarrhythmic drugs after an initial ablation. The patient understands these risk and wishes to proceed today.   -Continue amiodarone  200mg  daily.  -Continue metoprolol  XL 12.5mg  daily.   #Hypercoagulable state due to AF:  -Continue Eliquis  5mg  BID. No bleeding issues.    Follow up with Dr. Kennyth 3 months after catheter ablation.    Signed, Fonda Kennyth, MD      "

## 2024-03-08 NOTE — Anesthesia Procedure Notes (Signed)
 Procedure Name: Intubation Date/Time: 03/08/2024 10:59 AM  Performed by: Genny Gun, CRNAPre-anesthesia Checklist: Patient identified, Emergency Drugs available, Suction available, Patient being monitored and Timeout performed Patient Re-evaluated:Patient Re-evaluated prior to induction Oxygen Delivery Method: Circle system utilized Preoxygenation: Pre-oxygenation with 100% oxygen Induction Type: IV induction Ventilation: Mask ventilation without difficulty and Oral airway inserted - appropriate to patient size Laryngoscope Size: Mac and 4 Grade View: Grade I Tube type: Oral Tube size: 7.0 mm Number of attempts: 1 Placement Confirmation: ETT inserted through vocal cords under direct vision, positive ETCO2 and breath sounds checked- equal and bilateral Secured at: 22 cm Tube secured with: Tape Dental Injury: Teeth and Oropharynx as per pre-operative assessment

## 2024-03-08 NOTE — Progress Notes (Signed)
 Dr. Kennyth to the bedside to assess pt. bilaterally, femoral sites, and discuss discharge instructions with patient. Patient and spouse verbalized understanding. Nurse assist patient to ambulate to the bathroom  for post procedure void, positive void. Pt. Has steady gait.No femoral site bleeding noted. Patient tolerated regular diet without emesis.

## 2024-03-09 ENCOUNTER — Encounter (HOSPITAL_COMMUNITY): Payer: Self-pay | Admitting: Cardiology

## 2024-03-09 LAB — POCT ACTIVATED CLOTTING TIME
Activated Clotting Time: 225 s
Activated Clotting Time: 312 s

## 2024-03-09 MED FILL — Fentanyl Citrate Preservative Free (PF) Inj 100 MCG/2ML: INTRAMUSCULAR | Qty: 2 | Status: AC

## 2024-03-16 ENCOUNTER — Other Ambulatory Visit: Payer: Self-pay | Admitting: Internal Medicine

## 2024-03-20 ENCOUNTER — Inpatient Hospital Stay (HOSPITAL_COMMUNITY)
Admission: EM | Admit: 2024-03-20 | Discharge: 2024-03-23 | DRG: 291 | Disposition: A | Discharge status: Home / Self Care

## 2024-03-20 ENCOUNTER — Other Ambulatory Visit: Payer: Self-pay

## 2024-03-20 ENCOUNTER — Encounter (HOSPITAL_COMMUNITY): Payer: Self-pay

## 2024-03-20 ENCOUNTER — Emergency Department (HOSPITAL_COMMUNITY)

## 2024-03-20 DIAGNOSIS — Z8616 Personal history of COVID-19: Secondary | ICD-10-CM

## 2024-03-20 DIAGNOSIS — I5043 Acute on chronic combined systolic (congestive) and diastolic (congestive) heart failure: Secondary | ICD-10-CM | POA: Diagnosis present

## 2024-03-20 DIAGNOSIS — E8770 Fluid overload, unspecified: Secondary | ICD-10-CM | POA: Diagnosis present

## 2024-03-20 DIAGNOSIS — I5042 Chronic combined systolic (congestive) and diastolic (congestive) heart failure: Secondary | ICD-10-CM | POA: Diagnosis not present

## 2024-03-20 DIAGNOSIS — R0982 Postnasal drip: Secondary | ICD-10-CM | POA: Diagnosis present

## 2024-03-20 DIAGNOSIS — I110 Hypertensive heart disease with heart failure: Secondary | ICD-10-CM | POA: Diagnosis present

## 2024-03-20 DIAGNOSIS — M109 Gout, unspecified: Secondary | ICD-10-CM | POA: Diagnosis present

## 2024-03-20 DIAGNOSIS — Z79899 Other long term (current) drug therapy: Secondary | ICD-10-CM

## 2024-03-20 DIAGNOSIS — Z86711 Personal history of pulmonary embolism: Secondary | ICD-10-CM | POA: Diagnosis not present

## 2024-03-20 DIAGNOSIS — R0602 Shortness of breath: Secondary | ICD-10-CM | POA: Diagnosis present

## 2024-03-20 DIAGNOSIS — Z833 Family history of diabetes mellitus: Secondary | ICD-10-CM | POA: Diagnosis not present

## 2024-03-20 DIAGNOSIS — I4811 Longstanding persistent atrial fibrillation: Secondary | ICD-10-CM | POA: Diagnosis present

## 2024-03-20 DIAGNOSIS — R059 Cough, unspecified: Secondary | ICD-10-CM | POA: Diagnosis present

## 2024-03-20 DIAGNOSIS — Z7901 Long term (current) use of anticoagulants: Secondary | ICD-10-CM | POA: Diagnosis not present

## 2024-03-20 DIAGNOSIS — I428 Other cardiomyopathies: Secondary | ICD-10-CM | POA: Diagnosis present

## 2024-03-20 DIAGNOSIS — I5023 Acute on chronic systolic (congestive) heart failure: Secondary | ICD-10-CM | POA: Diagnosis present

## 2024-03-20 DIAGNOSIS — R079 Chest pain, unspecified: Secondary | ICD-10-CM

## 2024-03-20 DIAGNOSIS — E876 Hypokalemia: Secondary | ICD-10-CM | POA: Diagnosis present

## 2024-03-20 DIAGNOSIS — F32A Depression, unspecified: Secondary | ICD-10-CM | POA: Diagnosis present

## 2024-03-20 LAB — TROPONIN T, HIGH SENSITIVITY
Troponin T High Sensitivity: 22 ng/L — ABNORMAL HIGH (ref 0–19)
Troponin T High Sensitivity: 18 ng/L (ref 0–19)

## 2024-03-20 LAB — COMPREHENSIVE METABOLIC PANEL WITH GFR
ALT: 15 U/L (ref 0–44)
AST: 28 U/L (ref 15–41)
Albumin: 3.7 g/dL (ref 3.5–5.0)
Alkaline Phosphatase: 96 U/L (ref 38–126)
Anion gap: 12 (ref 5–15)
BUN: 9 mg/dL (ref 8–23)
CO2: 28 mmol/L (ref 22–32)
Calcium: 9 mg/dL (ref 8.9–10.3)
Chloride: 97 mmol/L — ABNORMAL LOW (ref 98–111)
Creatinine, Ser: 0.97 mg/dL (ref 0.61–1.24)
GFR, Estimated: >60 mL/min
Glucose, Bld: 116 mg/dL — ABNORMAL HIGH (ref 70–99)
Potassium: 3.1 mmol/L — ABNORMAL LOW (ref 3.5–5.1)
Sodium: 137 mmol/L (ref 135–145)
Total Bilirubin: 1.2 mg/dL (ref 0.0–1.2)
Total Protein: 7.6 g/dL (ref 6.5–8.1)

## 2024-03-20 LAB — CBC WITH DIFFERENTIAL/PLATELET
Abs Immature Granulocytes: 0.03 K/uL (ref 0.00–0.07)
Basophils Absolute: 0 K/uL (ref 0.0–0.1)
Basophils Relative: 0 %
Eosinophils Absolute: 0 K/uL (ref 0.0–0.5)
Eosinophils Relative: 0 %
HCT: 40.2 % (ref 39.0–52.0)
Hemoglobin: 13.4 g/dL (ref 13.0–17.0)
Immature Granulocytes: 0 %
Lymphocytes Relative: 19 %
Lymphs Abs: 1.8 K/uL (ref 0.7–4.0)
MCH: 32.2 pg (ref 26.0–34.0)
MCHC: 33.3 g/dL (ref 30.0–36.0)
MCV: 96.6 fL (ref 80.0–100.0)
Monocytes Absolute: 0.7 K/uL (ref 0.1–1.0)
Monocytes Relative: 8 %
Neutro Abs: 7 K/uL (ref 1.7–7.7)
Neutrophils Relative %: 73 %
Platelets: 278 K/uL (ref 150–400)
RBC: 4.16 MIL/uL — ABNORMAL LOW (ref 4.22–5.81)
RDW: 13.7 % (ref 11.5–15.5)
WBC: 9.5 K/uL (ref 4.0–10.5)
nRBC: 0 % (ref 0.0–0.2)

## 2024-03-20 LAB — CBC
HCT: 34.8 % — ABNORMAL LOW (ref 39.0–52.0)
Hemoglobin: 11.6 g/dL — ABNORMAL LOW (ref 13.0–17.0)
MCH: 32.3 pg (ref 26.0–34.0)
MCHC: 33.3 g/dL (ref 30.0–36.0)
MCV: 96.9 fL (ref 80.0–100.0)
Platelets: 237 K/uL (ref 150–400)
RBC: 3.59 MIL/uL — ABNORMAL LOW (ref 4.22–5.81)
RDW: 13.5 % (ref 11.5–15.5)
WBC: 9.5 K/uL (ref 4.0–10.5)
nRBC: 0 % (ref 0.0–0.2)

## 2024-03-20 LAB — LIPASE, BLOOD: Lipase: 20 U/L (ref 11–51)

## 2024-03-20 LAB — RESP PANEL BY RT-PCR (RSV, FLU A&B, COVID)  RVPGX2
Influenza A by PCR: NEGATIVE
Influenza B by PCR: NEGATIVE
Resp Syncytial Virus by PCR: NEGATIVE
SARS Coronavirus 2 by RT PCR: NEGATIVE

## 2024-03-20 LAB — HIV ANTIBODY (ROUTINE TESTING W REFLEX): HIV Screen 4th Generation wRfx: NONREACTIVE

## 2024-03-20 LAB — CREATININE, SERUM
Creatinine, Ser: 0.78 mg/dL (ref 0.61–1.24)
GFR, Estimated: >60 mL/min

## 2024-03-20 LAB — PRO BRAIN NATRIURETIC PEPTIDE: Pro Brain Natriuretic Peptide: 479 pg/mL — ABNORMAL HIGH

## 2024-03-20 MED ORDER — ACETAMINOPHEN 325 MG PO TABS: 650.0000 mg | ORAL_TABLET | Freq: Once | ORAL | Status: DC

## 2024-03-20 MED ORDER — ACETAMINOPHEN 325 MG PO TABS
650.0000 mg | ORAL_TABLET | Freq: Four times a day (QID) | ORAL | Status: DC | PRN
  Administered 2024-03-21 – 2024-03-23 (×3): 650 mg via ORAL
  Filled 2024-03-20 (×3): qty 2

## 2024-03-20 MED ORDER — ALLOPURINOL 300 MG PO TABS
300.0000 mg | ORAL_TABLET | Freq: Every day | ORAL | Status: DC
  Administered 2024-03-21 – 2024-03-23 (×3): 300 mg via ORAL
  Filled 2024-03-20 (×2): qty 1
  Filled 2024-03-20: qty 3

## 2024-03-20 MED ORDER — ACETAMINOPHEN 500 MG PO TABS
1000.0000 mg | ORAL_TABLET | Freq: Once | ORAL | Status: AC
  Administered 2024-03-20: 1000 mg via ORAL
  Filled 2024-03-20: qty 2

## 2024-03-20 MED ORDER — APIXABAN 5 MG PO TABS
5.0000 mg | ORAL_TABLET | Freq: Two times a day (BID) | ORAL | Status: DC
  Administered 2024-03-20 – 2024-03-23 (×6): 5 mg via ORAL
  Filled 2024-03-20 (×6): qty 1

## 2024-03-20 MED ORDER — FUROSEMIDE 10 MG/ML IJ SOLN
40.0000 mg | Freq: Once | INTRAMUSCULAR | Status: AC
  Administered 2024-03-20: 40 mg via INTRAVENOUS
  Filled 2024-03-20: qty 4

## 2024-03-20 MED ORDER — ONDANSETRON HCL 4 MG/2ML IJ SOLN: 4.0000 mg | Freq: Four times a day (QID) | INTRAMUSCULAR | Status: DC | PRN

## 2024-03-20 MED ORDER — POTASSIUM CHLORIDE CRYS ER 20 MEQ PO TBCR: 40.0000 meq | EXTENDED_RELEASE_TABLET | Freq: Once | ORAL | Status: DC

## 2024-03-20 MED ORDER — ENOXAPARIN SODIUM 40 MG/0.4ML IJ SOSY: 40.0000 mg | PREFILLED_SYRINGE | INTRAMUSCULAR | Status: DC

## 2024-03-20 MED ORDER — FUROSEMIDE 10 MG/ML IJ SOLN
40.0000 mg | Freq: Once | INTRAMUSCULAR | Status: AC
  Administered 2024-03-21: 40 mg via INTRAVENOUS
  Filled 2024-03-20: qty 4

## 2024-03-20 MED ORDER — LOSARTAN POTASSIUM 50 MG PO TABS
25.0000 mg | ORAL_TABLET | Freq: Every day | ORAL | Status: DC
  Administered 2024-03-21 – 2024-03-23 (×3): 25 mg via ORAL
  Filled 2024-03-20 (×3): qty 1

## 2024-03-20 MED ORDER — ACETAMINOPHEN 650 MG RE SUPP: 650.0000 mg | Freq: Four times a day (QID) | RECTAL | Status: DC | PRN

## 2024-03-20 MED ORDER — SODIUM CHLORIDE 0.9 % IV BOLUS
1000.0000 mL | Freq: Once | INTRAVENOUS | Status: AC
  Administered 2024-03-20: 1000 mL via INTRAVENOUS

## 2024-03-20 MED ORDER — METOPROLOL SUCCINATE ER 25 MG PO TB24
12.5000 mg | ORAL_TABLET | Freq: Every day | ORAL | Status: DC
  Administered 2024-03-21 – 2024-03-23 (×3): 12.5 mg via ORAL
  Filled 2024-03-20 (×3): qty 1

## 2024-03-20 MED ORDER — ONDANSETRON HCL 4 MG PO TABS: 4.0000 mg | ORAL_TABLET | Freq: Four times a day (QID) | ORAL | Status: DC | PRN

## 2024-03-20 MED ORDER — POTASSIUM CHLORIDE CRYS ER 20 MEQ PO TBCR
40.0000 meq | EXTENDED_RELEASE_TABLET | ORAL | Status: AC
  Administered 2024-03-20 – 2024-03-21 (×3): 40 meq via ORAL
  Filled 2024-03-20 (×3): qty 2

## 2024-03-20 NOTE — ED Provider Notes (Signed)
 " Calvin Esparza Provider Note   CSN: [id number] Arrival date & time: 03/20/24  1313     Patient presents with: Chest Pain and Shortness of Breath   Calvin Esparza is a 64 y.o. male.   Pt complains of shortness of breath and chest discomfort.  Pt had an ablation on 12/23.  Pt reports he has not felt well since the ablation.  Pt reports he has felt like he has had the flu.  Pt reports he is significantly short of breath with exertion. Pt reports he had some soreness at cath site, bruising is improving. Pt denies fever or chills.  Pt is on eliquis .  Pt reports he has not missed any dosages. Pt is taking lasix .  He has stopped afib medications.  Patient has a past medical history of A-fib, pulmonary embolism, CHF, cardiomyopathy and hyperlipidemia.  The history is provided by the patient. No language interpreter was used.  Chest Pain Associated symptoms: shortness of breath   Shortness of Breath Associated symptoms: chest pain        Prior to Admission medications  Medication Sig Start Date End Date Taking? Authorizing Provider  allopurinol  (ZYLOPRIM ) 300 MG tablet Take 300 mg by mouth daily.    [provider]  apixaban  (ELIQUIS ) 5 MG TABS tablet Take 1 tablet (5 mg total) by mouth 2 (two) times daily. PLEASE SCHEDULE APPOINTMENT FOR MORE REFILLS [phone] OPTION 2 03/16/24   Bensimhon, Toribio SAUNDERS, MD  colchicine  0.6 MG tablet Take 1 tablet (0.6 mg total) by mouth daily as needed. 10/31/22 02/19/24  Lenon Marien CROME, MD  furosemide  (LASIX ) 20 MG tablet Take 1 tablet (20 mg total) by mouth as needed (for weight gain of 3 lbs in a day or 5 lbs in a week). 08/21/23   Lorene Lesley CROME, PA-C  losartan  (COZAAR ) 25 MG tablet Take 1 tablet (25 mg total) by mouth daily. 04/30/23   Bensimhon, Toribio SAUNDERS, MD  metoprolol  succinate (TOPROL -XL) 25 MG 24 hr tablet TAKE 1/2 TABLET BY MOUTH EVERY DAY 12/07/23   Furth, Cadence H, PA-C    Allergies: Patient  has no known allergies.    Review of Systems  Respiratory:  Positive for shortness of breath.   Cardiovascular:  Positive for chest pain.  All other systems reviewed and are negative.   Updated Vital Signs BP (!) 165/91   Pulse 83   Temp 98.2 F (36.8 C) (Oral)   Resp 20   SpO2 96%   Physical Exam Vitals and nursing note reviewed.  Constitutional:      Appearance: He is well-developed.  HENT:     Head: Normocephalic.  Cardiovascular:     Rate and Rhythm: Normal rate and regular rhythm.     Heart sounds: Normal heart sounds.  Pulmonary:     Effort: Pulmonary effort is normal.     Breath sounds: Normal breath sounds.  Abdominal:     General: There is no distension.     Palpations: Abdomen is soft.  Musculoskeletal:        General: Normal range of motion.     Cervical back: Normal range of motion.  Skin:    General: Skin is warm.  Neurological:     General: No focal deficit present.     Mental Status: He is alert and oriented to person, place, and time.     (all labs ordered are listed, but only abnormal results are displayed) Labs Reviewed  CBC  WITH DIFFERENTIAL/PLATELET - Abnormal; Notable for the following components:      Result Value   RBC 4.16 (*)    All other components within normal limits  COMPREHENSIVE METABOLIC PANEL WITH GFR - Abnormal; Notable for the following components:   Potassium 3.1 (*)    Chloride 97 (*)    Glucose, Bld 116 (*)    All other components within normal limits  PRO BRAIN NATRIURETIC PEPTIDE - Abnormal; Notable for the following components:   Pro Brain Natriuretic Peptide 479.0 (*)    All other components within normal limits  TROPONIN T, HIGH SENSITIVITY - Abnormal; Notable for the following components:   Troponin T High Sensitivity 22 (*)    All other components within normal limits  RESP PANEL BY RT-PCR (RSV, FLU A&B, COVID)  RVPGX2  LIPASE, BLOOD  TROPONIN T, HIGH SENSITIVITY    EKG: EKG  Interpretation Date/Time:  Sunday March 20 2024 15:35:51 EST Ventricular Rate:  87 PR Interval:  142 QRS Duration:  92 QT Interval:  427 QTC Calculation: 514 R Axis:   -3  Text Interpretation: Sinus rhythm LVH with secondary repolarization abnormality Prolonged QT interval No acute changes brsdies prologed QT Confirmed by Charlyn Sora [phone]) on 03/20/2024 4:58:18 PM  Radiology: [facility] Chest 2 View Result Date: 03/20/2024 EXAM: 2 VIEW(S) XRAY OF THE CHEST 03/20/2024 01:48:00 PM COMPARISON: 10/27/2022 CLINICAL HISTORY: chest pain and shortness of breath FINDINGS: LUNGS AND PLEURA: Bronchial wall thickening in the left lower lung favoring airway infection or inflammation. No pleural effusion. No pneumothorax. HEART AND MEDIASTINUM: Mild cardiomegaly. BONES AND SOFT TISSUES: Bridging enthesopathy of thoracic spine. Old healed posterolateral right seventh rib fracture. IMPRESSION: 1. Bronchial wall thickening in the left lower lung, favoring airway infection or inflammation. Electronically signed by: Calvin Gatlin MD 03/20/2024 02:00 PM EST RP Workstation: [id number]     Procedures   Medications Ordered in the ED  sodium chloride  0.9 % bolus 1,000 mL (1,000 mLs Intravenous New Bag/Given 03/20/24 1533)                                    Medical Decision Making Pt complains of shortness of breath and chest discomfort, symptoms are worse with exertion.  Pt reports he feels like he has the flu.  No fever or chills   Amount and/or Complexity of Data Reviewed Independent Historian: spouse    Details: Pt here with his wife who is supportive  External Data Reviewed: notes.    Details: Cardiology notes reviewed.  Labs: ordered. Decision-making details documented in ED Course.    Details: Influenza, Covid and RSV are negative  Troponin  Radiology: ordered and independent interpretation performed. Decision-making details documented in ED Course.    Details: Chest xray shows bronchial wall  thickening  ECG/medicine tests: ordered and independent interpretation performed. Decision-making details documented in ED Course.    Details: EKg normal sinus, prolonged qt. Discussion of management or test interpretation with external provider(s): Hospitalist consulted and will admit I spoke with Cardiology who will see and consult  Risk OTC drugs. Decision regarding hospitalization.        Final diagnoses:  Shortness of breath  Chest pain, unspecified type    ED Discharge Orders     None          Flint Sonny POUR, PA-C 03/20/24 1952    Charlyn Sora, MD 03/24/24 1427  "

## 2024-03-20 NOTE — Consult Note (Signed)
 "  Cardiology Consultation   Patient ID: Calvin Esparza MRN: [id number]; DOB: 01/03/1961  Admit date: 03/20/2024 Date of Consult: 03/20/2024  PCP:  Lenon Layman ORN, MD   [facility] Providers Cardiologist:  Evalene Lunger, MD  Electrophysiologist:  [name], MD       Patient Profile: Calvin Esparza is a 64 y.o. male with a hx of NICM (last EF 45-50%, nadir 20%), PAF on riva s/p recent PVI, gout, alcohol dependence who is being seen 03/20/2024 for the evaluation of SOB at the request of [place].  History of Present Illness: Calvin Esparza had a PVI on 12/23. He reports that a couple days after that he began experiencing cold-like symptoms and thought that he had the flu.  He did not take his temperature but he was feeling hot and cold and fatigue as well as reduced appetite.  He also developed a significant cough.  In the last few days he has developed significant orthopnea (normally lies flat at night with 1 pillow) to the point that he had to sleep upright in the chair in the last 2 nights.  He has had significant dyspnea on exertion.  He denies any other new symptoms or new medications or substances.  He has been taking his cardiac medications consistently.  He denies chest pain.   Past Medical History:  Diagnosis Date   Alcohol abuse    Chronic pain    COVID-19    a. 02/2019   HFrEF (heart failure with reduced ejection fraction) (HCC)    a. 05/2019 Echo: EF 35-40%; b. 07/2021 Echo: EF 40-45%; c. 10/2022 Echo: EF 20-25%, glob HK, mildly reduced RV fxn, sev dil LA, mid-mod MR, mild AI.   History of medication noncompliance    Longstanding persistent atrial fibrillation (HCC)    a. Dx 03/2018; b. 05/2019 recurrent Afib in setting of PE. CHA2DS2VASc = 1-->Eliquis  later changed to xarelto ; b. 07/2019 s/p DCCV (150J); d. 08/2019 admit w/ AF RVR in settting of noncompliance - longstanding persistent afib since on bb/ccb rx.   NICM (nonischemic cardiomyopathy) (HCC)    a. 05/2019  Echo: EF 35-40%; b. 05/2019 MV: EF 32%, no ischemia; c. 07/2021 Echo: EF 40-45%; d. 10/2022 Echo: EF 20-25%, glob HK.   Pulmonary embolism (HCC)    a. 05/2019 CTA Chest: mild amt of PE w/in a lower lobe branch of RPA; b. 08/2019 CTA Chest: Small PE in RML and RLL PA.    Past Surgical History:  Procedure Laterality Date   ATRIAL FIBRILLATION ABLATION N/A 03/08/2024   Procedure: ATRIAL FIBRILLATION ABLATION;  Surgeon: Kitty Fonda, MD;  Location: [facility] INVASIVE CV LAB;  Service: Cardiovascular;  Laterality: N/A;   CARDIOVERSION N/A 07/29/2019   Procedure: CARDIOVERSION;  Surgeon: Lunger Evalene PARAS, MD;  Location: [facility] ORS;  Service: Cardiovascular;  Laterality: N/A;   CARDIOVERSION N/A 03/03/2023   Procedure: CARDIOVERSION;  Surgeon: Cherrie Toribio SAUNDERS, MD;  Location: [facility] INVASIVE CV LAB;  Service: Cardiovascular;  Laterality: N/A;   COLONOSCOPY WITH PROPOFOL  N/A 05/24/2020   Procedure: COLONOSCOPY WITH PROPOFOL ;  Surgeon: Unk Corinn Skiff, MD;  Location: [facility] ENDOSCOPY;  Service: Gastroenterology;  Laterality: N/A;   ESOPHAGOGASTRODUODENOSCOPY (EGD) WITH PROPOFOL  N/A 02/27/2020   Procedure: ESOPHAGOGASTRODUODENOSCOPY (EGD) WITH PROPOFOL ;  Surgeon: Unk Corinn Skiff, MD;  Location: [facility] ENDOSCOPY;  Service: Gastroenterology;  Laterality: N/A;   ESOPHAGOGASTRODUODENOSCOPY (EGD) WITH PROPOFOL  N/A 05/24/2020   Procedure: ESOPHAGOGASTRODUODENOSCOPY (EGD) WITH PROPOFOL ;  Surgeon: Unk Corinn Skiff, MD;  Location: [facility] ENDOSCOPY;  Service:  Gastroenterology;  Laterality: N/A;   RIGHT/LEFT HEART CATH AND CORONARY ANGIOGRAPHY N/A 10/27/2022   Procedure: RIGHT/LEFT HEART CATH AND CORONARY ANGIOGRAPHY;  Surgeon: Mady Bruckner, MD;  Location: [facility] INVASIVE CV LAB;  Service: Cardiovascular;  Laterality: N/A;     Home Medications:  Prior to Admission medications  Medication Sig Start Date End Date Taking? Authorizing Provider  allopurinol  (ZYLOPRIM ) 300 MG tablet Take 300 mg by mouth daily.   Yes [provider]  apixaban  (ELIQUIS ) 5 MG TABS tablet Take 1 tablet (5 mg total) by mouth 2 (two) times daily. PLEASE SCHEDULE APPOINTMENT FOR MORE REFILLS [phone] OPTION 2 03/16/24  Yes Bensimhon, Toribio SAUNDERS, MD  Aspirin -Acetaminophen -Caffeine (GOODY HEADACHE PO) Take 1 packet by mouth as needed (headache).   Yes [provider]  colchicine  0.6 MG tablet Take 1 tablet (0.6 mg total) by mouth daily as needed. 10/31/22 03/20/24 Yes Lenon Marien CROME, MD  furosemide  (LASIX ) 20 MG tablet Take 1 tablet (20 mg total) by mouth as needed (for weight gain of 3 lbs in a day or 5 lbs in a week). 08/21/23  Yes Lorene Sinclair L, PA-C  losartan  (COZAAR ) 25 MG tablet Take 1 tablet (25 mg total) by mouth daily. 04/30/23  Yes Bensimhon, Toribio SAUNDERS, MD  metoprolol  succinate (TOPROL -XL) 25 MG 24 hr tablet TAKE 1/2 TABLET BY MOUTH EVERY DAY 12/07/23  Yes Furth, Cadence H, PA-C    Scheduled Meds:  [START ON 03/21/2024] enoxaparin  (LOVENOX ) injection  40 mg Subcutaneous Q24H   furosemide   40 mg Intravenous Once   potassium chloride   40 mEq Oral Q4H   Continuous Infusions:  PRN Meds: acetaminophen  **OR** acetaminophen , ondansetron  **OR** ondansetron  (ZOFRAN ) IV  Allergies:   Allergies[1]  Social History:   Social History   Socioeconomic History   Marital status: Married    Spouse name: Not on file   Number of children: Not on file   Years of education: Not on file   Highest education level: Not on file  Occupational History   Not on file  Tobacco Use   Smoking status: Never   Smokeless tobacco: Current    Types: Chew  Vaping Use   Vaping status: Never Used  Substance and Sexual Activity   Alcohol use: Yes    Alcohol/week: 7.0 standard drinks of alcohol    Types: 7 Cans of beer per week    Comment: He states he hasn't drank in the past 2 weeks but has been a heavy drinker in the past   Drug use: Not Currently   Sexual activity: Not on file  Other Topics Concern   Not on file  Social History  Narrative   Lives locally w/ wife.  Does not routinely exercise.  Works full time in arboriculturist.   Social Drivers of Health   Tobacco Use: High Risk (03/20/2024)   Patient History    Smoking Tobacco Use: Never    Smokeless Tobacco Use: Current    Passive Exposure: Not on file  Financial Resource Strain: Not on file  Food Insecurity: No Food Insecurity (11/07/2022)   Hunger Vital Sign    Worried About Running Out of Food in the Last Year: Never true    Ran Out of Food in the Last Year: Never true  Transportation Needs: No Transportation Needs (11/07/2022)   PRAPARE - Administrator, Civil Service (Medical): No    Lack of Transportation (Non-Medical): No  Physical Activity: Not on file  Stress: Not on file  Social Connections: Not on file  Intimate Partner Violence: Not At Risk (10/24/2022)   Humiliation, Afraid, Rape, and Kick questionnaire    Fear of Current or Ex-Partner: No    Emotionally Abused: No    Physically Abused: No    Sexually Abused: No  Depression (PHQ2-9): Low Risk (11/28/2021)   Depression (PHQ2-9)    PHQ-2 Score: 1  Alcohol Screen: Not on file  Housing: Unknown (09/16/2023)   Received from [facility]   Epic    Unable to Pay for Housing in the Last Year: Not on file    Number of Times Moved in the Last Year: Not on file    At any time in the past 12 months, were you homeless or living in a shelter (including now)?: No  Utilities: Not At Risk (10/24/2022)   AHC Utilities    Threatened with loss of utilities: No  Health Literacy: Not on file    Family History:    Family History  Problem Relation Age of Onset   Bone cancer Father    Stroke Father    Diabetes Sister      ROS:  Please see the history of present illness.   All other ROS reviewed and negative.     Physical Exam/Data: Vitals:   03/20/24 1748 03/20/24 1815 03/20/24 1830 03/20/24 2045  BP:  (!) 175/100 (!) 184/90 (!) 141/104  Pulse:  86 84 85  Resp:  (!) 22  (!) 22 (!) 27  Temp: 98.5 F (36.9 C)     TempSrc: Oral     SpO2:  94% 97% 94%   No intake or output data in the 24 hours ending 03/20/24 2045    03/08/2024    7:26 AM 01/05/2024    3:28 PM 08/21/2023    1:31 PM  Last 3 Weights  Weight (lbs) 206 lb 204 lb 207 lb  Weight (kg) 93.441 kg 92.534 kg 93.895 kg     There is no height or weight on file to calculate BMI.  General:  Well nourished, well developed, in no acute distress HEENT: normal Neck: JVP to tragus Vascular: No carotid bruits; Distal pulses 2+ bilaterally Cardiac:  normal S1, S2; RRR; no murmur  Lungs:  clear to auscultation bilaterally, no wheezing, rhonchi or rales  Abd: soft, nontender, no hepatomegaly  Ext: no edema Musculoskeletal:  No deformities, BUE and BLE strength normal and equal Skin: warm and dry  Neuro:  CNs 2-12 intact, no focal abnormalities noted Psych:  Normal affect   EKG:  The EKG was personally reviewed and demonstrates:  NSR Telemetry:  Telemetry was personally reviewed and demonstrates:   NSR  Relevant CV Studies: See above  Laboratory Data: High Sensitivity Troponin:  No results for input(s): TROPONINIHS in the last 720 hours.  Recent Labs  Lab 03/20/24 1344 03/20/24 1531  TRNPT 22* 18      Chemistry Recent Labs  Lab 03/20/24 1344  NA 137  K 3.1*  CL 97*  CO2 28  GLUCOSE 116*  BUN 9  CREATININE 0.97  CALCIUM  9.0  GFRNONAA >60  ANIONGAP 12    Recent Labs  Lab 03/20/24 1344  PROT 7.6  ALBUMIN 3.7  AST 28  ALT 15  ALKPHOS 96  BILITOT 1.2   Lipids No results for input(s): CHOL, TRIG, HDL, LABVLDL, LDLCALC, CHOLHDL in the last 168 hours.  Hematology Recent Labs  Lab 03/20/24 1344 03/20/24 2021  WBC 9.5 9.5  RBC 4.16* 3.59*  HGB  13.4 11.6*  HCT 40.2 34.8*  MCV 96.6 96.9  MCH 32.2 32.3  MCHC 33.3 33.3  RDW 13.7 13.5  PLT 278 237   Thyroid  No results for input(s): TSH, FREET4 in the last 168 hours.  BNP Recent Labs  Lab 03/20/24 1709   PROBNP 479.0*    DDimer No results for input(s): DDIMER in the last 168 hours.  Radiology/Studies:  DG Chest 2 View Result Date: 03/20/2024 EXAM: 2 VIEW(S) XRAY OF THE CHEST 03/20/2024 01:48:00 PM COMPARISON: 10/27/2022 CLINICAL HISTORY: chest pain and shortness of breath FINDINGS: LUNGS AND PLEURA: Bronchial wall thickening in the left lower lung favoring airway infection or inflammation. No pleural effusion. No pneumothorax. HEART AND MEDIASTINUM: Mild cardiomegaly. BONES AND SOFT TISSUES: Bridging enthesopathy of thoracic spine. Old healed posterolateral right seventh rib fracture. IMPRESSION: 1. Bronchial wall thickening in the left lower lung, favoring airway infection or inflammation. Electronically signed by: Norman Gatlin MD 03/20/2024 02:00 PM EST RP Workstation: [id number]     Assessment and Plan: ADHF Acute on chronic systolic and diastolic heart failure.  He is presenting with significant dyspnea on exertion and orthopnea and has jugular venous pressure elevated to his ear.  He is warm on exam and hemodynamically stable, low concern for low output. he reports a viral syndrome that occurred a couple of days after his pulmonary vein isolation.  This may be the flu even though he tested negative since it was a week ago.  Also may be a different viral illness.  Either way this is the most likely trigger for his heart failure exacerbation.  He is in sinus rhythm currently and so I do not think this is related to his atrial fibrillation or his PVI.  Recommend: - diurese with IV lasix  40 - redose if does not have at least 1L UOP - can give another dose around early morning - continue home losartan  and metoprolol     Risk Assessment/Risk Scores:       New York  Heart Association (NYHA) Functional Class NYHA Class IV  CHA2DS2-VASc Score = 2   This indicates a 2.2% annual risk of stroke. The patient's score is based upon: CHF History: 1 HTN History: 1 Diabetes History: 0 Stroke  History: 0 Vascular Disease History: 0 Age Score: 0 Gender Score: 0        For questions or updates, please contact [facility] Please consult [email or web address] for contact info under    Signed, Dorn CHRISTELLA Flemings, MD  03/20/2024 8:45 PM     [1] No Known Allergies  "

## 2024-03-20 NOTE — H&P (Signed)
 " History and Physical    ORA MCNATT FMW:[id number] DOB: [birth year] DOA: 03/20/2024  PCP: Lenon Layman ORN, MD   Chief Complaint:  sob  HPI: Calvin Esparza is a 64 y.o. male with medical history significant of heart failure with reduced ejection fraction, nonischemic cardiomyopathy, paroxysmal A-fib status post ablation who presents Emergency Department shortness of breath.  Patient recently had ablation on 12/23.  He thought he had the flu and developed worsening cold-like symptoms.  He presented to the emergency department who was found to be volume overload and labs were presentation which showed respiratory viral panel negative, WBC 9.5, hemoglobin 13.4, potassium 3.1, troponin 22, 18, BNP 479.  Chest x-ray was obtained which showed bronchial thickening.  Cardiology was consulted and recommended evaluation for heart failure as patient appeared volume overloaded.  They recommended IV diuresis as well as home losartan  and metoprolol .   Review of Systems: Review of Systems  Constitutional:  Negative for chills and fever.  HENT: Negative.    Eyes: Negative.   Respiratory:  Positive for shortness of breath.   Cardiovascular: Negative.   Gastrointestinal: Negative.   Genitourinary: Negative.   Musculoskeletal: Negative.   Skin: Negative.   Neurological: Negative.   Endo/Heme/Allergies: Negative.   Psychiatric/Behavioral: Negative.    All other systems reviewed and are negative.    As per HPI otherwise 10 point review of systems negative.   Allergies[1]  Past Medical History:  Diagnosis Date   Alcohol abuse    Chronic pain    COVID-19    a. 02/2019   HFrEF (heart failure with reduced ejection fraction) (HCC)    a. 05/2019 Echo: EF 35-40%; b. 07/2021 Echo: EF 40-45%; c. 10/2022 Echo: EF 20-25%, glob HK, mildly reduced RV fxn, sev dil LA, mid-mod MR, mild AI.   History of medication noncompliance    Longstanding persistent atrial fibrillation (HCC)    a. Dx 03/2018; b.  05/2019 recurrent Afib in setting of PE. CHA2DS2VASc = 1-->Eliquis  later changed to xarelto ; b. 07/2019 s/p DCCV (150J); d. 08/2019 admit w/ AF RVR in settting of noncompliance - longstanding persistent afib since on bb/ccb rx.   NICM (nonischemic cardiomyopathy) (HCC)    a. 05/2019 Echo: EF 35-40%; b. 05/2019 MV: EF 32%, no ischemia; c. 07/2021 Echo: EF 40-45%; d. 10/2022 Echo: EF 20-25%, glob HK.   Pulmonary embolism (HCC)    a. 05/2019 CTA Chest: mild amt of PE w/in a lower lobe branch of RPA; b. 08/2019 CTA Chest: Small PE in RML and RLL PA.    Past Surgical History:  Procedure Laterality Date   ATRIAL FIBRILLATION ABLATION N/A 03/08/2024   Procedure: ATRIAL FIBRILLATION ABLATION;  Surgeon: Kennyth Chew, MD;  Location: [facility] INVASIVE CV LAB;  Service: Cardiovascular;  Laterality: N/A;   CARDIOVERSION N/A 07/29/2019   Procedure: CARDIOVERSION;  Surgeon: Perla Evalene PARAS, MD;  Location: [facility] ORS;  Service: Cardiovascular;  Laterality: N/A;   CARDIOVERSION N/A 03/03/2023   Procedure: CARDIOVERSION;  Surgeon: Cherrie Toribio SAUNDERS, MD;  Location: [facility] INVASIVE CV LAB;  Service: Cardiovascular;  Laterality: N/A;   COLONOSCOPY WITH PROPOFOL  N/A 05/24/2020   Procedure: COLONOSCOPY WITH PROPOFOL ;  Surgeon: Unk Corinn Skiff, MD;  Location: [facility] ENDOSCOPY;  Service: Gastroenterology;  Laterality: N/A;   ESOPHAGOGASTRODUODENOSCOPY (EGD) WITH PROPOFOL  N/A 02/27/2020   Procedure: ESOPHAGOGASTRODUODENOSCOPY (EGD) WITH PROPOFOL ;  Surgeon: Unk Corinn Skiff, MD;  Location: [facility] ENDOSCOPY;  Service: Gastroenterology;  Laterality: N/A;   ESOPHAGOGASTRODUODENOSCOPY (EGD) WITH PROPOFOL  N/A 05/24/2020   Procedure: ESOPHAGOGASTRODUODENOSCOPY (EGD)  WITH PROPOFOL ;  Surgeon: Unk Corinn Skiff, MD;  Location: [facility] ENDOSCOPY;  Service: Gastroenterology;  Laterality: N/A;   RIGHT/LEFT HEART CATH AND CORONARY ANGIOGRAPHY N/A 10/27/2022   Procedure: RIGHT/LEFT HEART CATH AND CORONARY ANGIOGRAPHY;  Surgeon: Mady Bruckner, MD;   Location: [facility] INVASIVE CV LAB;  Service: Cardiovascular;  Laterality: N/A;     reports that he has never smoked. His smokeless tobacco use includes chew. He reports current alcohol use of about 7.0 standard drinks of alcohol per week. He reports that he does not currently use drugs.  Family History  Problem Relation Age of Onset   Bone cancer Father    Stroke Father    Diabetes Sister     Prior to Admission medications  Medication Sig Start Date End Date Taking? Authorizing Provider  allopurinol  (ZYLOPRIM ) 300 MG tablet Take 300 mg by mouth daily.   Yes [provider]  apixaban  (ELIQUIS ) 5 MG TABS tablet Take 1 tablet (5 mg total) by mouth 2 (two) times daily. PLEASE SCHEDULE APPOINTMENT FOR MORE REFILLS [phone] OPTION 2 03/16/24  Yes Bensimhon, Toribio SAUNDERS, MD  Aspirin -Acetaminophen -Caffeine (GOODY HEADACHE PO) Take 1 packet by mouth as needed (headache).   Yes [provider]  colchicine  0.6 MG tablet Take 1 tablet (0.6 mg total) by mouth daily as needed. 10/31/22 03/20/24 Yes Lenon Marien CROME, MD  furosemide  (LASIX ) 20 MG tablet Take 1 tablet (20 mg total) by mouth as needed (for weight gain of 3 lbs in a day or 5 lbs in a week). 08/21/23  Yes Lorene Sinclair L, PA-C  losartan  (COZAAR ) 25 MG tablet Take 1 tablet (25 mg total) by mouth daily. 04/30/23  Yes Bensimhon, Toribio SAUNDERS, MD  metoprolol  succinate (TOPROL -XL) 25 MG 24 hr tablet TAKE 1/2 TABLET BY MOUTH EVERY DAY 12/07/23  Yes Furth, Cadence H, PA-C    Physical Exam: Vitals:   03/20/24 1748 03/20/24 1815 03/20/24 1830 03/20/24 2045  BP:  (!) 175/100 (!) 184/90 (!) 141/104  Pulse:  86 84 85  Resp:  (!) 22 (!) 22 (!) 27  Temp: 98.5 F (36.9 C)     TempSrc: Oral     SpO2:  94% 97% 94%   Physical Exam Constitutional:      Appearance: He is well-developed and normal weight.  Eyes:     Pupils: Pupils are equal, round, and reactive to light.  Cardiovascular:     Rate and Rhythm: Normal rate and regular rhythm.      Heart sounds: Normal heart sounds.  Pulmonary:     Effort: Pulmonary effort is normal.     Breath sounds: Normal breath sounds.  Abdominal:     General: Bowel sounds are normal.     Palpations: Abdomen is soft.  Musculoskeletal:        General: Normal range of motion.  Skin:    General: Skin is warm.     Capillary Refill: Capillary refill takes less than 2 seconds.  Neurological:     General: No focal deficit present.     Mental Status: He is alert.  Psychiatric:        Mood and Affect: Mood normal.        Labs on Admission: I have personally reviewed the patients's labs and imaging studies.  Assessment/Plan Principal Problem:   Volume overload   # Heart failure with reduced ejection fraction exacerbation - Patient volume overloaded - Presented to ER and seen by cardiology who recommended IV diuresis  Plan: Continue IV Lasix  Appreciate  cardiology communications  # Hypokalemia-replete potassium  # Hypertension-continue losartan , metoprolol   # Paroxysmal A-fib-continue Eliquis   # Gout-continue allopurinol    Admission status: Inpatient Telemetry  Certification: The appropriate patient status for this patient is INPATIENT. Inpatient status is judged to be reasonable and necessary in order to provide the required intensity of service to ensure the patient's safety. The patient's presenting symptoms, physical exam findings, and initial radiographic and laboratory data in the context of their chronic comorbidities is felt to place them at high risk for further clinical deterioration. Furthermore, it is not anticipated that the patient will be medically stable for discharge from the hospital within 2 midnights of admission.   * I certify that at the point of admission it is my clinical judgment that the patient will require inpatient hospital care spanning beyond 2 midnights from the point of admission due to high intensity of service, high risk for further deterioration  and high frequency of surveillance required.DEWAINE Lamar Dess MD [facility] If 7PM-7AM, please contact night-coverage [email or web address]  03/20/2024, 9:40 PM        [1] No Known Allergies  "

## 2024-03-20 NOTE — ED Notes (Signed)
 CCMD called to place pt on monitor

## 2024-03-20 NOTE — ED Provider Triage Note (Signed)
 Emergency Medicine Provider Triage Evaluation Note  Calvin Esparza , a 64 y.o. male  was evaluated in triage.  Pt complains of chest pain and shortness of breath with exertion that started this morning.  Reports this comes on when he walks.  He feels at baseline at rest.  Reports that he thinks he had the flu for the past week or so and is just getting over this.  He is still coughing up clear appearing mucus.  Denies fever or chills.  Also had a ablation for atrial fibrillation on 12/23  Review of Systems  Positive: As above Negative: As above  Physical Exam  BP (!) 138/93   Pulse 95   Temp 98.2 F (36.8 C) (Oral)   Resp (!) 24   SpO2 95%  Gen:   Awake, no distress   Resp:  Normal effort  MSK:   Moves extremities without difficulty    Medical Decision Making  Medically screening exam initiated at 1:32 PM.  Appropriate orders placed.  Calvin Esparza was informed that the remainder of the evaluation will be completed by another provider, this initial triage assessment does not replace that evaluation, and the importance of remaining in the ED until their evaluation is complete.     Veta Palma, PA-C 03/20/24 1332

## 2024-03-20 NOTE — ED Triage Notes (Addendum)
 Pt arrives via POV. Pt reports he thinks he recently had the flu. Pt reports he is now experiencing sob and chest tightness since yesterday. Pt arrives AxOx4. He reports he also had an ablation on 12/23.

## 2024-03-21 ENCOUNTER — Inpatient Hospital Stay (HOSPITAL_COMMUNITY)

## 2024-03-21 DIAGNOSIS — I5043 Acute on chronic combined systolic (congestive) and diastolic (congestive) heart failure: Secondary | ICD-10-CM | POA: Diagnosis not present

## 2024-03-21 DIAGNOSIS — I5042 Chronic combined systolic (congestive) and diastolic (congestive) heart failure: Secondary | ICD-10-CM

## 2024-03-21 LAB — CBC
HCT: 34.1 % — ABNORMAL LOW (ref 39.0–52.0)
Hemoglobin: 11.5 g/dL — ABNORMAL LOW (ref 13.0–17.0)
MCH: 33 pg (ref 26.0–34.0)
MCHC: 33.7 g/dL (ref 30.0–36.0)
MCV: 98 fL (ref 80.0–100.0)
Platelets: 223 K/uL (ref 150–400)
RBC: 3.48 MIL/uL — ABNORMAL LOW (ref 4.22–5.81)
RDW: 13.5 % (ref 11.5–15.5)
WBC: 7.5 K/uL (ref 4.0–10.5)
nRBC: 0 % (ref 0.0–0.2)

## 2024-03-21 LAB — ECHOCARDIOGRAM COMPLETE
Area-P 1/2: 3.85 cm2
Calc EF: 58 %
P 1/2 time: 281 ms
S' Lateral: 4.8 cm
Single Plane A2C EF: 57.7 %
Single Plane A4C EF: 59.9 %

## 2024-03-21 LAB — BASIC METABOLIC PANEL WITH GFR
Anion gap: 9 (ref 5–15)
BUN: 10 mg/dL (ref 8–23)
CO2: 28 mmol/L (ref 22–32)
Calcium: 8.2 mg/dL — ABNORMAL LOW (ref 8.9–10.3)
Chloride: 102 mmol/L (ref 98–111)
Creatinine, Ser: 0.88 mg/dL (ref 0.61–1.24)
GFR, Estimated: >60 mL/min
Glucose, Bld: 117 mg/dL — ABNORMAL HIGH (ref 70–99)
Potassium: 3 mmol/L — ABNORMAL LOW (ref 3.5–5.1)
Sodium: 139 mmol/L (ref 135–145)

## 2024-03-21 LAB — RENAL FUNCTION PANEL
Albumin: 3.7 g/dL (ref 3.5–5.0)
Anion gap: 13 (ref 5–15)
BUN: 9 mg/dL (ref 8–23)
CO2: 25 mmol/L (ref 22–32)
Calcium: 9.1 mg/dL (ref 8.9–10.3)
Chloride: 99 mmol/L (ref 98–111)
Creatinine, Ser: 0.94 mg/dL (ref 0.61–1.24)
GFR, Estimated: >60 mL/min
Glucose, Bld: 130 mg/dL — ABNORMAL HIGH (ref 70–99)
Phosphorus: 1.7 mg/dL — ABNORMAL LOW (ref 2.5–4.6)
Potassium: 3.6 mmol/L (ref 3.5–5.1)
Sodium: 137 mmol/L (ref 135–145)

## 2024-03-21 LAB — MAGNESIUM: Magnesium: 1.5 mg/dL — ABNORMAL LOW (ref 1.7–2.4)

## 2024-03-21 MED ORDER — PERFLUTREN LIPID MICROSPHERE
1.0000 mL | INTRAVENOUS | Status: AC | PRN
  Administered 2024-03-21: 3 mL via INTRAVENOUS

## 2024-03-21 MED ORDER — DIPHENHYDRAMINE HCL 25 MG PO CAPS
25.0000 mg | ORAL_CAPSULE | Freq: Once | ORAL | Status: AC
  Administered 2024-03-21: 25 mg via ORAL
  Filled 2024-03-21: qty 1

## 2024-03-21 MED ORDER — SPIRONOLACTONE 12.5 MG HALF TABLET
12.5000 mg | ORAL_TABLET | Freq: Every day | ORAL | Status: DC
  Administered 2024-03-21 – 2024-03-23 (×3): 12.5 mg via ORAL
  Filled 2024-03-21 (×3): qty 1

## 2024-03-21 MED ORDER — POTASSIUM CHLORIDE CRYS ER 20 MEQ PO TBCR
20.0000 meq | EXTENDED_RELEASE_TABLET | Freq: Once | ORAL | Status: AC
  Administered 2024-03-21: 20 meq via ORAL
  Filled 2024-03-21: qty 1

## 2024-03-21 MED ORDER — IPRATROPIUM-ALBUTEROL 0.5-2.5 (3) MG/3ML IN SOLN
3.0000 mL | Freq: Four times a day (QID) | RESPIRATORY_TRACT | Status: DC | PRN
  Administered 2024-03-21: 3 mL via RESPIRATORY_TRACT
  Filled 2024-03-21: qty 3

## 2024-03-21 MED ORDER — FLUTICASONE PROPIONATE 50 MCG/ACT NA SUSP
2.0000 | Freq: Every day | NASAL | Status: DC
  Administered 2024-03-21 – 2024-03-22 (×2): 2 via NASAL
  Filled 2024-03-21: qty 16

## 2024-03-21 MED ORDER — IPRATROPIUM-ALBUTEROL 0.5-2.5 (3) MG/3ML IN SOLN: 3.0000 mL | Freq: Once | RESPIRATORY_TRACT | Status: DC

## 2024-03-21 MED ORDER — DIPHENHYDRAMINE HCL 25 MG PO CAPS
25.0000 mg | ORAL_CAPSULE | Freq: Four times a day (QID) | ORAL | Status: DC | PRN
  Administered 2024-03-21: 25 mg via ORAL
  Filled 2024-03-21: qty 1

## 2024-03-21 MED ORDER — MAGNESIUM SULFATE 2 GM/50ML IV SOLN
2.0000 g | Freq: Once | INTRAVENOUS | Status: AC
  Administered 2024-03-21: 2 g via INTRAVENOUS
  Filled 2024-03-21: qty 50

## 2024-03-21 MED ORDER — LORATADINE 10 MG PO TABS
10.0000 mg | ORAL_TABLET | Freq: Every day | ORAL | Status: DC
  Administered 2024-03-21 – 2024-03-23 (×3): 10 mg via ORAL
  Filled 2024-03-21 (×3): qty 1

## 2024-03-21 MED ORDER — FUROSEMIDE 10 MG/ML IJ SOLN
40.0000 mg | Freq: Two times a day (BID) | INTRAMUSCULAR | Status: DC
  Administered 2024-03-21 – 2024-03-22 (×3): 40 mg via INTRAVENOUS
  Filled 2024-03-21 (×3): qty 4

## 2024-03-21 MED ORDER — POTASSIUM CHLORIDE CRYS ER 20 MEQ PO TBCR
40.0000 meq | EXTENDED_RELEASE_TABLET | Freq: Once | ORAL | Status: AC
  Administered 2024-03-21: 40 meq via ORAL
  Filled 2024-03-21: qty 2

## 2024-03-21 NOTE — ED Notes (Signed)
 Bladder Scan resulted 11ml and the second scan was 37ml.

## 2024-03-21 NOTE — Progress Notes (Signed)
 " PROGRESS NOTE    [name]  FMW:[id number] DOB: 05-23-1960 DOA: 03/20/2024 PCP: Lenon Layman ORN, MD  Outpatient Specialists:     Brief Narrative:  As per H&P done on presentation: Calvin Esparza is a 64 y.o. male with medical history significant of heart failure with reduced ejection fraction, nonischemic cardiomyopathy, paroxysmal A-fib status post ablation who presents Emergency Department shortness of breath.  Patient recently had ablation on 12/23.  He thought he had the flu and developed worsening cold-like symptoms.  He presented to the emergency department who was found to be volume overload and labs were presentation which showed respiratory viral panel negative, WBC 9.5, hemoglobin 13.4, potassium 3.1, troponin 22, 18, BNP 479.  Chest x-ray was obtained which showed bronchial thickening.  Cardiology was consulted and recommended evaluation for heart failure as patient appeared volume overloaded.  They recommended IV diuresis as well as home losartan  and metoprolol .  03/21/2024: Seen alongside patient's son.  Patient was admitted with likely CHF exacerbation and viral syndrome.  Patient also describes likely upper airway cough syndrome (postnasal drip).  Will start patient on Flonase  and loratadine .  Continue IV Lasix .  Cardiology input is appreciated.   Assessment & Plan:   Principal Problem:   Volume overload   # Heart failure with reduced ejection fraction exacerbation: - Patient volume overloaded, dyspnea on exertion and orthopnea. -Cardiology input is appreciated. - Continue IV Lasix  -Patient is slowly improving. - Edema is slowly resolving. - Strict I's and O's.Plan:   # Hypokalemia: -Last potassium level of 3.6. - Magnesium  of 1.5. -Keep potassium greater than 4.  # Hypomagnesemia: - Magnesium  of 1.5. - IV magnesium  4 g x 1 dose.  # Hypophosphatemia: - Phosphorus of 1.7. - Neutra-Phos 250 mg Q6 hourly x 4 doses. - Repeat renal panel, magnesium  in  the morning.  # Upper airway cough syndrome (postnasal drip): - Start Flonase  and loratadine  10 mg p.o. once daily.   # Hypertension -Continue to optimize.   # Paroxysmal A-fib: - Status post ablation. - Patient is currently in normal sinus rhythm. -continue Eliquis     -continue allopurinol    DVT prophylaxis: Eliquis . Code Status: Full code Family Communication: Son by bedside Disposition Plan: Likely discharge back home eventually   Consultants:  Cardiology  Procedures:  None.  Antimicrobials:  None.   Subjective: Shortness of breath is improving. Leg edema is improving. Itchy throat and coughing.  Objective: Vitals:   03/21/24 0500 03/21/24 0801 03/21/24 1106 03/21/24 1154  BP: (!) 161/96 (!) 149/96 (!) 137/104   Pulse: 91 87 (!) 119   Resp: (!) 22 (!) 21 17   Temp:  98.2 F (36.8 C)  98.6 F (37 C)  TempSrc:  Oral  Oral  SpO2: 97% 97% 99%     Intake/Output Summary (Last 24 hours) at 03/21/2024 1242 Last data filed at 03/21/2024 9385 Gross per 24 hour  Intake --  Output 3125 ml  Net -3125 ml   There were no vitals filed for this visit.  Examination:  General exam: Appears calm and comfortable  Respiratory system: Clear to auscultation. Respiratory effort normal. Cardiovascular system: S1 & S2 heard Gastrointestinal system: Abdomen is soft, soft and nontender.  Central nervous system: Alert and oriented.  Extremities: Edema of lower extremities.  Data Reviewed: I have personally reviewed following labs and imaging studies  CBC: Recent Labs  Lab 03/20/24 1344 03/20/24 2021 03/21/24 0304  WBC 9.5 9.5 7.5  NEUTROABS 7.0  --   --  HGB 13.4 11.6* 11.5*  HCT 40.2 34.8* 34.1*  MCV 96.6 96.9 98.0  PLT 278 237 223   Basic Metabolic Panel: Recent Labs  Lab 03/20/24 1344 03/20/24 2021 03/21/24 0304  NA 137  --  139  K 3.1*  --  3.0*  CL 97*  --  102  CO2 28  --  28  GLUCOSE 116*  --  117*  BUN 9  --  10  CREATININE 0.97 0.78 0.88   CALCIUM  9.0  --  8.2*   GFR: Estimated Creatinine Clearance: 98.2 mL/min (by C-G formula based on SCr of 0.88 mg/dL). Liver Function Tests: Recent Labs  Lab 03/20/24 1344  AST 28  ALT 15  ALKPHOS 96  BILITOT 1.2  PROT 7.6  ALBUMIN 3.7   Recent Labs  Lab 03/20/24 1344  LIPASE 20   No results for input(s): AMMONIA in the last 168 hours. Coagulation Profile: No results for input(s): INR, PROTIME in the last 168 hours. Cardiac Enzymes: No results for input(s): CKTOTAL, CKMB, CKMBINDEX, TROPONINI in the last 168 hours. BNP (last 3 results) Recent Labs    03/20/24 1709  PROBNP 479.0*   HbA1C: No results for input(s): HGBA1C in the last 72 hours. CBG: No results for input(s): GLUCAP in the last 168 hours. Lipid Profile: No results for input(s): CHOL, HDL, LDLCALC, TRIG, CHOLHDL, LDLDIRECT in the last 72 hours. Thyroid  Function Tests: No results for input(s): TSH, T4TOTAL, FREET4, T3FREE, THYROIDAB in the last 72 hours. Anemia Panel: No results for input(s): VITAMINB12, FOLATE, FERRITIN, TIBC, IRON, RETICCTPCT in the last 72 hours. Urine analysis: No results found for: COLORURINE, APPEARANCEUR, LABSPEC, PHURINE, GLUCOSEU, HGBUR, BILIRUBINUR, KETONESUR, PROTEINUR, UROBILINOGEN, NITRITE, LEUKOCYTESUR Sepsis Labs: @LABRCNTIP (procalcitonin:4,lacticidven:4)  ) Recent Results (from the past 240 hours)  Resp panel by RT-PCR (RSV, Flu A&B, Covid) Anterior Nasal Swab     Status: None   Collection Time: 03/20/24  1:32 PM   Specimen: Anterior Nasal Swab  Result Value Ref Range Status   SARS Coronavirus 2 by RT PCR NEGATIVE NEGATIVE Final   Influenza A by PCR NEGATIVE NEGATIVE Final   Influenza B by PCR NEGATIVE NEGATIVE Final    Comment: (NOTE) The Xpert Xpress SARS-CoV-2/FLU/RSV plus assay is intended as an aid in the diagnosis of influenza from Nasopharyngeal swab specimens and should not be used as  a sole basis for treatment. Nasal washings and aspirates are unacceptable for Xpert Xpress SARS-CoV-2/FLU/RSV testing.  Fact Sheet for Patients: bloggercourse.com  Fact Sheet for Healthcare Providers: seriousbroker.it  This test is not yet approved or cleared by the [place]  FDA and has been authorized for detection and/or diagnosis of SARS-CoV-2 by FDA under an Emergency Use Authorization (EUA). This EUA will remain in effect (meaning this test can be used) for the duration of the COVID-19 declaration under Section 564(b)(1) of the Act, 21 U.S.C. section 360bbb-3(b)(1), unless the authorization is terminated or revoked.     Resp Syncytial Virus by PCR NEGATIVE NEGATIVE Final    Comment: (NOTE) Fact Sheet for Patients: bloggercourse.com  Fact Sheet for Healthcare Providers: seriousbroker.it  This test is not yet approved or cleared by the [place]  FDA and has been authorized for detection and/or diagnosis of SARS-CoV-2 by FDA under an Emergency Use Authorization (EUA). This EUA will remain in effect (meaning this test can be used) for the duration of the COVID-19 declaration under Section 564(b)(1) of the Act, 21 U.S.C. section 360bbb-3(b)(1), unless the authorization is terminated or revoked.  Performed at [facility]  [facility], [address] [place], [place] [address]          Radiology Studies: DG Chest 2 View Result Date: 03/20/2024 EXAM: 2 VIEW(S) XRAY OF THE CHEST 03/20/2024 01:48:00 PM COMPARISON: 10/27/2022 CLINICAL HISTORY: chest pain and shortness of breath FINDINGS: LUNGS AND PLEURA: Bronchial wall thickening in the left lower lung favoring airway infection or inflammation. No pleural effusion. No pneumothorax. HEART AND MEDIASTINUM: Mild cardiomegaly. BONES AND SOFT TISSUES: Bridging enthesopathy of thoracic spine. Old healed posterolateral right seventh  rib fracture. IMPRESSION: 1. Bronchial wall thickening in the left lower lung, favoring airway infection or inflammation. Electronically signed by: Norman Gatlin MD 03/20/2024 02:00 PM EST RP Workstation: [id number]        Scheduled Meds:  allopurinol   300 mg Oral Daily   apixaban   5 mg Oral BID   losartan   25 mg Oral Daily   metoprolol  succinate  12.5 mg Oral Daily   spironolactone   12.5 mg Oral Daily   Continuous Infusions:   LOS: 1 day    Time spent: 55 minutes    Leatrice Chapel, MD  [facility] 7PM-7AM contact night coverage as above    "

## 2024-03-21 NOTE — Progress Notes (Addendum)
 "  Progress Note  Patient Name: Calvin Esparza Date of Encounter: 03/21/2024 [facility] Cardiologist: Calvin Lunger, MD   Interval Summary   Presented to the hospital for cough and worsening shortness of breath that started mid last week.  Stated that he also had some flulike symptoms last week.  Cough is productive and patient has a white sputum.  Stated that his orthopnea has improved.  Denies any lower extremity edema or abdominal distention.  Able to get up and walk to the bathroom without being short of breath.  Said that he gets short of breath and lightheaded when he coughs too much.  Vital Signs Vitals:   03/21/24 0406 03/21/24 0456 03/21/24 0500 03/21/24 0801  BP:  (!) 176/98 (!) 161/96 (!) 149/96  Pulse:  85 91 87  Resp:  14 (!) 22 (!) 21  Temp: 98 F (36.7 C)   98.2 F (36.8 C)  TempSrc: Oral   Oral  SpO2:  98% 97% 97%    Intake/Output Summary (Last 24 hours) at 03/21/2024 1006 Last data filed at 03/21/2024 9385 Gross per 24 hour  Intake --  Output 3125 ml  Net -3125 ml      03/08/2024    7:26 AM 01/05/2024    3:28 PM 08/21/2023    1:31 PM  Last 3 Weights  Weight (lbs) 206 lb 204 lb 207 lb  Weight (kg) 93.441 kg 92.534 kg 93.895 kg      Telemetry/ECG  Normal sinus rhythm with heart rates in the 90s to 1 teens.- Personally Reviewed  Physical Exam  GEN: No acute distress.  Alert and orientated on room air. Neck: No JVD.  Somewhat difficult to assess due to body habitus. Cardiac: RRR, no murmurs, rubs, or gallops.  Respiratory: Diffuse wheezing heard throughout the lungs GI: Soft, nontender, non-distended  MS: No edema  Assessment & Plan  Calvin Esparza is a 64 y.o. male with a hx of nonischemic cardiomyopathy (last EF 45-50%, paroxysmal atrial fibrillation s/p recent PVI, gout, alcohol dependence who is being seen 03/20/2024 for the evaluation of SOB at the request of Calvin Esparza.   Acute on chronic combined heart failure Presented to the hospital for  worsening dyspnea on exertion and orthopnea. Cardiac cath on 10/2022 showed no significant CAD. Prior TTE on 05/2023 showed a reduced LVEF of 45 to 50%.  On 8/24 LVEF was 20 to 25%. Labs showed potassium of 3.0, has received potassium replacement. Showed elevated proBNP of 479 Has received 2 doses of 40 mg IV Lasix . Chest x-ray showed Bronchial wall thickening in the left lower lung, favoring airway infection  Appears euvolemic on exam. Echo pending GDMT Continue losartan  25 mg daily Continue metoprolol  succinate 12.5 mg daily. Start spironolactone  12.5 mg daily.   Paroxysmal atrial fibrillation Had an ablation performed on 03/08/2024. Is currently in normal sinus rhythm on telemetry. Continue Eliquis  5 mg twice daily Continue metoprolol  succinate 12.5 mg daily    For questions or updates, please contact [facility] Please consult [email or web address] for contact info under   Signed, Calvin Clause, PA-C   Patient seen and examined, note reviewed with the signed Advanced Practice Provider. I personally reviewed laboratory data, imaging studies and relevant notes. I independently examined the patient and formulated the important aspects of the plan. I have personally discussed the plan with the patient and/or family. Comments or changes to the note/plan are indicated below.  Patient seen and examined at his bedside. Family member is at  bedside.    He is still short of breath, but clinically does not appear to be volume overloaded suspect this may be respiratory driven. Symptoms not quite consistent with ACS either. Will get echo for completeness.   He has a hx of tobacco use, no restrictive or obstructive airway sounds - but may benefit from likely outpatient PFT.     Continue losartan  and Torpol xl at current dose. Agree with starting the Aldactone .   In sinus rhythm today for PAF , continue Eliquis  and beta blocker.   We will continue to follow with you.    Calvin Bastos DO,  MS [facility] Attending Cardiologist [facility]  [address] [place], [place] [address] [phone] Website: [email or web address]   "

## 2024-03-21 NOTE — ED Notes (Signed)
 Patient was on 2 Liters for comfort

## 2024-03-21 NOTE — Progress Notes (Signed)
 Heart Failure Navigator Progress Note  Assessed for Heart & Vascular TOC clinic readiness.  Patient does not meet criteria due to he is an Advanced Heart Failure Team patient of Dr. Bensimhon. .   Navigator will sign off at this time.   Stephane Haddock, BSN, Scientist, clinical (histocompatibility and immunogenetics) Only

## 2024-03-22 DIAGNOSIS — I5023 Acute on chronic systolic (congestive) heart failure: Secondary | ICD-10-CM

## 2024-03-22 LAB — RESPIRATORY PANEL BY PCR

## 2024-03-22 LAB — RESP PANEL BY RT-PCR (RSV, FLU A&B, COVID)  RVPGX2
Influenza A by PCR: NEGATIVE
Influenza B by PCR: NEGATIVE
Resp Syncytial Virus by PCR: NEGATIVE
SARS Coronavirus 2 by RT PCR: NEGATIVE

## 2024-03-22 MED ORDER — PHENOL 1.4 % MT LIQD: 1.0000 | OROMUCOSAL | Status: DC | PRN

## 2024-03-22 MED ORDER — BENZONATATE 100 MG PO CAPS
200.0000 mg | ORAL_CAPSULE | Freq: Three times a day (TID) | ORAL | Status: DC | PRN
  Administered 2024-03-22: 200 mg via ORAL
  Filled 2024-03-22: qty 2

## 2024-03-22 NOTE — Progress Notes (Signed)
 Transition of Care [facility]) - Inpatient Brief Assessment   Patient Details  Name: Calvin Esparza MRN: [id number] Date of Birth: Jan 11, 1961  Transition of [facility]) CM/SW Contact:    Rosaline JONELLE Joe, RN Phone Number: 03/22/2024, 8:58 AM   Clinical Narrative: Patient admitted from home with heart failure and volume overload.  No IP Care management needs at this time.  Patient has community PCP listed - follow up scheduled in the AVS.   Transition of Care Asessment: Insurance and Status: (P) Insurance coverage has been reviewed Patient has primary care physician: (P) Yes Home environment has been reviewed: (P) from home Prior level of function:: (P) self Prior/Current Home Services: (P) No current home services Social Drivers of Health Review: (P) SDOH reviewed no interventions necessary Readmission risk has been reviewed: (P) Yes Transition of care needs: (P) no transition of care needs at this time

## 2024-03-22 NOTE — Progress Notes (Signed)
 "  Progress Note  Patient Name: Calvin Esparza Date of Encounter: 03/22/2024  Primary Cardiologist: Timothy Gollan, MD   Subjective   Patient seen and examined at his bedside/ Stilll coughing but shortness of breath is improving.   Inpatient Medications    Scheduled Meds:  allopurinol   300 mg Oral Daily   apixaban   5 mg Oral BID   fluticasone   2 spray Each Nare Daily   furosemide   40 mg Intravenous BID   loratadine   10 mg Oral Daily   losartan   25 mg Oral Daily   metoprolol  succinate  12.5 mg Oral Daily   spironolactone   12.5 mg Oral Daily   Continuous Infusions:  PRN Meds: acetaminophen  **OR** acetaminophen , benzonatate , diphenhydrAMINE , ipratropium-albuterol , ondansetron  **OR** ondansetron  (ZOFRAN ) IV, phenol   Vital Signs    Vitals:   03/21/24 2024 03/22/24 0107 03/22/24 0422 03/22/24 0742  BP: 123/78 (!) 149/90 (!) 141/95 117/83  Pulse: 90 84 98 92  Resp: 16 18 18 18   Temp: 98.4 F (36.9 C) 98.1 F (36.7 C) 100.1 F (37.8 C)   TempSrc: Oral  Oral   SpO2: 94% 94% 94% 96%   No intake or output data in the 24 hours ending 03/22/24 1022 There were no vitals filed for this visit.  Telemetry     - Personally Reviewed  ECG     - Personally Reviewed  Physical Exam     General: Comfortable Head: Atraumatic, normal size  Eyes: PEERLA, EOMI  Neck: Supple, normal JVD Cardiac: Normal S1, S2; RRR; no murmurs, rubs, or gallops Lungs: Clear to auscultation bilaterally Abd: Soft, nontender, no hepatomegaly  Ext: warm, no edema Musculoskeletal: No deformities, BUE and BLE strength normal and equal Skin: Warm and dry, no rashes     Labs    Chemistry Recent Labs  Lab 03/20/24 1344 03/20/24 2021 03/21/24 0304 03/21/24 1438  NA 137  --  139 137  K 3.1*  --  3.0* 3.6  CL 97*  --  102 99  CO2 28  --  28 25  GLUCOSE 116*  --  117* 130*  BUN 9  --  10 9  CREATININE 0.97 0.78 0.88 0.94  CALCIUM  9.0  --  8.2* 9.1  PROT 7.6  --   --   --   ALBUMIN 3.7  --    --  3.7  AST 28  --   --   --   ALT 15  --   --   --   ALKPHOS 96  --   --   --   BILITOT 1.2  --   --   --   GFRNONAA >60 >60 >60 >60  ANIONGAP 12  --  9 13     Hematology Recent Labs  Lab 03/20/24 1344 03/20/24 2021 03/21/24 0304  WBC 9.5 9.5 7.5  RBC 4.16* 3.59* 3.48*  HGB 13.4 11.6* 11.5*  HCT 40.2 34.8* 34.1*  MCV 96.6 96.9 98.0  MCH 32.2 32.3 33.0  MCHC 33.3 33.3 33.7  RDW 13.7 13.5 13.5  PLT 278 237 223    Cardiac EnzymesNo results for input(s): TROPONINI in the last 168 hours. No results for input(s): TROPIPOC in the last 168 hours.   BNP Recent Labs  Lab 03/20/24 1709  PROBNP 479.0*     DDimer No results for input(s): DDIMER in the last 168 hours.   Radiology    ECHOCARDIOGRAM COMPLETE Result Date: 03/21/2024    ECHOCARDIOGRAM REPORT   Patient Name:  ELSIE ONEIDA SPAIN Date of Exam: 03/21/2024 Medical Rec #:  [id number]        Height:       69.0 in Accession #:    [id number]       Weight:       206.0 lb Date of Birth:  July 03, 1960       BSA:          2.092 m Patient Age:    63 years         BP:           125/91 mmHg Patient Gender: M                HR:           100 bpm. Exam Location:  Inpatient Procedure: 2D Echo, Cardiac Doppler, Color Doppler and Intracardiac            Opacification Agent (Both Spectral and Color Flow Doppler were            utilized during procedure). Indications:    CHF  History:        Patient has prior history of Echocardiogram examinations, most                 recent 06/01/2023. Arrythmias:Atrial Fibrillation; Risk                 Factors:Hypertension and Dyslipidemia.  Sonographer:    Carmelita Hartshorn RDCS, FE, PE Referring Phys: [id number] ZANE ADAMS IMPRESSIONS  1. Left ventricular ejection fraction, by estimation, is 50 to 55%. The left ventricle has low normal function. The left ventricle has no regional wall motion abnormalities. The left ventricular internal cavity size was mildly dilated. Left ventricular diastolic parameters are  consistent with Grade I diastolic dysfunction (impaired relaxation).  2. Right ventricular systolic function is normal. The right ventricular size is normal.  3. Left atrial size was moderately dilated.  4. The mitral valve is normal in structure. No evidence of mitral valve regurgitation. No evidence of mitral stenosis.  5. The aortic valve is normal in structure. Aortic valve regurgitation is trivial. No aortic stenosis is present.  6. The inferior vena cava is normal in size with greater than 50% respiratory variability, suggesting right atrial pressure of 3 mmHg. FINDINGS  Left Ventricle: Left ventricular ejection fraction, by estimation, is 50 to 55%. The left ventricle has low normal function. The left ventricle has no regional wall motion abnormalities. Definity  contrast agent was given IV to delineate the left ventricular endocardial borders. The left ventricular internal cavity size was mildly dilated. There is no left ventricular hypertrophy. Left ventricular diastolic parameters are consistent with Grade I diastolic dysfunction (impaired relaxation). Right Ventricle: The right ventricular size is normal. No increase in right ventricular wall thickness. Right ventricular systolic function is normal. Left Atrium: Left atrial size was moderately dilated. Right Atrium: Right atrial size was normal in size. Pericardium: There is no evidence of pericardial effusion. Mitral Valve: The mitral valve is normal in structure. No evidence of mitral valve regurgitation. No evidence of mitral valve stenosis. Tricuspid Valve: The tricuspid valve is normal in structure. Tricuspid valve regurgitation is not demonstrated. No evidence of tricuspid stenosis. Aortic Valve: The aortic valve is normal in structure. Aortic valve regurgitation is trivial. Aortic regurgitation PHT measures 281 msec. No aortic stenosis is present. Pulmonic Valve: The pulmonic valve was normal in structure. Pulmonic valve regurgitation is not  visualized. No evidence of pulmonic stenosis. Aorta: The aortic root is  normal in size and structure. Venous: The inferior vena cava is normal in size with greater than 50% respiratory variability, suggesting right atrial pressure of 3 mmHg. IAS/Shunts: No atrial level shunt detected by color flow Doppler.  LEFT VENTRICLE PLAX 2D LVIDd:         5.80 cm      Diastology LVIDs:         4.80 cm      LV e' medial:    5.33 cm/s LV PW:         1.10 cm      LV E/e' medial:  7.6 LV IVS:        1.00 cm      LV e' lateral:   5.98 cm/s LVOT diam:     2.50 cm      LV E/e' lateral: 6.8 LV SV:         65 LV SV Index:   31 LVOT Area:     4.91 cm  LV Volumes (MOD) LV vol d, MOD A2C: 77.1 ml LV vol d, MOD A4C: 118.0 ml LV vol s, MOD A2C: 32.6 ml LV vol s, MOD A4C: 47.3 ml LV SV MOD A2C:     44.5 ml LV SV MOD A4C:     118.0 ml LV SV MOD BP:      55.2 ml LEFT ATRIUM           Index LA diam:      4.30 cm 2.06 cm/m LA Vol (A2C): 56.5 ml 27.01 ml/m LA Vol (A4C): 98.2 ml 46.94 ml/m  AORTIC VALVE LVOT Vmax:   82.50 cm/s LVOT Vmean:  54.300 cm/s LVOT VTI:    0.132 m AI PHT:      281 msec  AORTA Ao Root diam: 3.70 cm MITRAL VALVE               TRICUSPID VALVE MV Area (PHT): 3.85 cm    TR Peak grad:   13.8 mmHg MV Decel Time: 197 msec    TR Vmax:        186.00 cm/s MV E velocity: 40.70 cm/s MV A velocity: 70.80 cm/s  SHUNTS MV E/A ratio:  0.57        Systemic VTI:  0.13 m                            Systemic Diam: 2.50 cm Oneil Parchment MD Electronically signed by Oneil Parchment MD Signature Date/Time: 03/21/2024/4:58:29 PM    Final    DG Chest 2 View Result Date: 03/20/2024 EXAM: 2 VIEW(S) XRAY OF THE CHEST 03/20/2024 01:48:00 PM COMPARISON: 10/27/2022 CLINICAL HISTORY: chest pain and shortness of breath FINDINGS: LUNGS AND PLEURA: Bronchial wall thickening in the left lower lung favoring airway infection or inflammation. No pleural effusion. No pneumothorax. HEART AND MEDIASTINUM: Mild cardiomegaly. BONES AND SOFT TISSUES: Bridging  enthesopathy of thoracic spine. Old healed posterolateral right seventh rib fracture. IMPRESSION: 1. Bronchial wall thickening in the left lower lung, favoring airway infection or inflammation. Electronically signed by: Norman Gatlin MD 03/20/2024 02:00 PM EST RP Workstation: [id number]    Cardiac Studies   Echo reviewed   Patient Profile     64 y.o. male with shortness of breath and Acute on chronic diastolic heart failure.   Assessment & Plan    Acute on chronic diastolic heart failure  PAF  Hypertensive heart disease   He has had some improvement to IV Lasix   with a negative balance. Will benefit from IV lasix  for one more day then transition to Lasix  at home at least once daily 20 mg .  I do believe there may also be a chronic respiratory component may benefit from an outpatient sleep study.  Cont Toprol  xl 12.5 mg and Eliquis  5 mg BID.  Blood pressure is at target continue current regimen:     For questions or updates, please contact [facility] Please consult [email or web address] for contact info under Cardiology/STEMI.      Signed, Jadie Comas, DO  03/22/2024, 10:22 AM    "

## 2024-03-22 NOTE — Plan of Care (Signed)

## 2024-03-22 NOTE — Progress Notes (Signed)
 " PROGRESS NOTE    Calvin Esparza  FMW:[id number] DOB: 1960-12-16 DOA: 03/20/2024 PCP: Lenon Layman ORN, MD  Subjective: Complains of dry cough.  Had a fever last night.  Feels achy all over.  Denies nasal congestion.  States his breathing is still somewhat short.  No CP. Has been getting up to the bathroom on his own.          Brief Narrative:  As per H&P done on presentation: Calvin Esparza is a 64 y.o. male with medical history significant of heart failure with reduced ejection fraction, nonischemic cardiomyopathy, paroxysmal A-fib status post ablation who presents Emergency Department shortness of breath.  Patient recently had ablation on 12/23.  He thought he had the flu and developed worsening cold-like symptoms.  He presented to the emergency department who was found to be volume overload and labs were presentation which showed respiratory viral panel negative, WBC 9.5, hemoglobin 13.4, potassium 3.1, troponin 22, 18, BNP 479.  Chest x-ray was obtained which showed bronchial thickening.  Cardiology was consulted and recommended evaluation for heart failure as patient appeared volume overloaded.  They recommended IV diuresis as well as home losartan  and metoprolol .   03/21/2024: Seen alongside patient's son.  Patient was admitted with likely CHF exacerbation and viral syndrome.  Patient also describes likely upper airway cough syndrome (postnasal drip).  Will start patient on Flonase  and loratadine .  Continue IV Lasix .  Cardiology input is appreciated.  03/22/2024:03/22/24: K 3.6, Mg 1.5 --> supplemented - Possible viral syndrome.  The myalgias may have represent flu.  He was checked a couple days ago but symptoms have worsened.  Would be a candidate for Tamiflu if we discovered flu.  Repeat respiratory panel.  Cardiology plans 1 more day of IV diuresis then stable from their standpoint for home.     Assessment & Plan:   Principal Problem:   Volume overload     # Heart failure  with reduced ejection fraction exacerbation: - Patient volume overloaded, dyspnea on exertion and orthopnea. -Cardiology input is appreciated. - Continue IV Lasix  -Patient is slowly improving. - Edema is slowly resolving. - Strict I's and O's.Plan:   # Hypokalemia: -Last potassium level of 3.6. - Magnesium  of 1.5. -Keep potassium greater than 4.   # Hypomagnesemia: - Magnesium  of 1.5. - IV magnesium  4 g x 1 dose.   # Hypophosphatemia: - Phosphorus of 1.7. - Neutra-Phos 250 mg Q6 hourly x 4 doses. - Repeat renal panel, magnesium  in the morning.   # Upper airway cough syndrome (postnasal drip): - Start Flonase  and loratadine  10 mg p.o. once daily.   # Hypertension -Continue to optimize.   # Paroxysmal A-fib: - Status post ablation. - Patient is currently in normal sinus rhythm. -continue Eliquis      -continue allopurinol    DVT prophylaxis: SCDs Start: 03/20/24 1907 apixaban  (ELIQUIS ) tablet 5 mg     Code Status: Full Code Family Communication:  Disposition Plan: Home Reason for continuing need for hospitalization: Ongoing diuresis recommended.  Objective: Vitals:   03/21/24 1531 03/21/24 2024 03/22/24 0107 03/22/24 0422  BP: (!) 151/95 123/78 (!) 149/90 (!) 141/95  Pulse: 92 90 84 98  Resp: 18 16 18 18   Temp: 99.5 F (37.5 C) 98.4 F (36.9 C) 98.1 F (36.7 C) 100.1 F (37.8 C)  TempSrc: Oral Oral  Oral  SpO2: 94% 94% 94% 94%   No intake or output data in the 24 hours ending 03/22/24 0714 There were no vitals filed for  this visit.  Examination:  Physical Exam Constitutional:      Appearance: He is well-developed. He is not toxic-appearing.  HENT:     Head: Normocephalic and atraumatic.  Eyes:     Pupils: Pupils are equal, round, and reactive to light.  Neck:     Vascular: No JVD.  Cardiovascular:     Rate and Rhythm: Normal rate and regular rhythm.     Heart sounds:     No diastolic murmur is present.  Pulmonary:     Effort: Pulmonary effort  is normal. No tachypnea, accessory muscle usage or respiratory distress.     Breath sounds: No stridor.  Abdominal:     Tenderness: There is no abdominal tenderness. There is no guarding or rebound.  Musculoskeletal:     Right lower leg: No edema.     Left lower leg: No edema.  Neurological:     General: No focal deficit present.     Mental Status: He is alert.  Psychiatric:        Mood and Affect: Mood normal.     Data Reviewed: I have personally reviewed following labs and imaging studies  CBC: Recent Labs  Lab 03/20/24 1344 03/20/24 2021 03/21/24 0304  WBC 9.5 9.5 7.5  NEUTROABS 7.0  --   --   HGB 13.4 11.6* 11.5*  HCT 40.2 34.8* 34.1*  MCV 96.6 96.9 98.0  PLT 278 237 223   Basic Metabolic Panel: Recent Labs  Lab 03/20/24 1344 03/20/24 2021 03/21/24 0304 03/21/24 1438  NA 137  --  139 137  K 3.1*  --  3.0* 3.6  CL 97*  --  102 99  CO2 28  --  28 25  GLUCOSE 116*  --  117* 130*  BUN 9  --  10 9  CREATININE 0.97 0.78 0.88 0.94  CALCIUM  9.0  --  8.2* 9.1  MG  --   --   --  1.5*  PHOS  --   --   --  1.7*   GFR: Estimated Creatinine Clearance: 92 mL/min (by C-G formula based on SCr of 0.94 mg/dL). Liver Function Tests: Recent Labs  Lab 03/20/24 1344 03/21/24 1438  AST 28  --   ALT 15  --   ALKPHOS 96  --   BILITOT 1.2  --   PROT 7.6  --   ALBUMIN 3.7 3.7   Recent Labs  Lab 03/20/24 1344  LIPASE 20   No results for input(s): AMMONIA in the last 168 hours. Coagulation Profile: No results for input(s): INR, PROTIME in the last 168 hours. Cardiac Enzymes: No results for input(s): CKTOTAL, CKMB, CKMBINDEX, TROPONINI in the last 168 hours. ProBNP, BNP (last 5 results) Recent Labs    04/30/23 1502 03/20/24 1709  PROBNP  --  479.0*  BNP 16.8  --    HbA1C: No results for input(s): HGBA1C in the last 72 hours. CBG: No results for input(s): GLUCAP in the last 168 hours. Lipid Profile: No results for input(s): CHOL, HDL,  LDLCALC, TRIG, CHOLHDL, LDLDIRECT in the last 72 hours. Thyroid  Function Tests: No results for input(s): TSH, T4TOTAL, FREET4, T3FREE, THYROIDAB in the last 72 hours. Anemia Panel: No results for input(s): VITAMINB12, FOLATE, FERRITIN, TIBC, IRON, RETICCTPCT in the last 72 hours. Sepsis Labs: No results for input(s): PROCALCITON, LATICACIDVEN in the last 168 hours.  Recent Results (from the past 240 hours)  Resp panel by RT-PCR (RSV, Flu A&B, Covid) Anterior Nasal Swab  Status: None   Collection Time: 03/20/24  1:32 PM   Specimen: Anterior Nasal Swab  Result Value Ref Range Status   SARS Coronavirus 2 by RT PCR NEGATIVE NEGATIVE Final   Influenza A by PCR NEGATIVE NEGATIVE Final   Influenza B by PCR NEGATIVE NEGATIVE Final    Comment: (NOTE) The Xpert Xpress SARS-CoV-2/FLU/RSV plus assay is intended as an aid in the diagnosis of influenza from Nasopharyngeal swab specimens and should not be used as a sole basis for treatment. Nasal washings and aspirates are unacceptable for Xpert Xpress SARS-CoV-2/FLU/RSV testing.  Fact Sheet for Patients: bloggercourse.com  Fact Sheet for Healthcare Providers: seriousbroker.it  This test is not yet approved or cleared by the [place]  FDA and has been authorized for detection and/or diagnosis of SARS-CoV-2 by FDA under an Emergency Use Authorization (EUA). This EUA will remain in effect (meaning this test can be used) for the duration of the COVID-19 declaration under Section 564(b)(1) of the Act, 21 U.S.C. section 360bbb-3(b)(1), unless the authorization is terminated or revoked.     Resp Syncytial Virus by PCR NEGATIVE NEGATIVE Final    Comment: (NOTE) Fact Sheet for Patients: bloggercourse.com  Fact Sheet for Healthcare Providers: seriousbroker.it  This test is not yet approved or cleared by the  [place]  FDA and has been authorized for detection and/or diagnosis of SARS-CoV-2 by FDA under an Emergency Use Authorization (EUA). This EUA will remain in effect (meaning this test can be used) for the duration of the COVID-19 declaration under Section 564(b)(1) of the Act, 21 U.S.C. section 360bbb-3(b)(1), unless the authorization is terminated or revoked.  Performed at [facility], [address], [place], [place] [address]      Radiology Studies: ECHOCARDIOGRAM COMPLETE Result Date: 03/21/2024    ECHOCARDIOGRAM REPORT   Patient Name:   Calvin Esparza Date of Exam: 03/21/2024 Medical Rec #:  [id number]        Height:       69.0 in Accession #:    [id number]       Weight:       206.0 lb Date of Birth:  [birth year]       BSA:          2.092 m Patient Age:    63 years         BP:           125/91 mmHg Patient Gender: M                HR:           100 bpm. Exam Location:  Inpatient Procedure: 2D Echo, Cardiac Doppler, Color Doppler and Intracardiac            Opacification Agent (Both Spectral and Color Flow Doppler were            utilized during procedure). Indications:    CHF  History:        Patient has prior history of Echocardiogram examinations, most                 recent 06/01/2023. Arrythmias:Atrial Fibrillation; Risk                 Factors:Hypertension and Dyslipidemia.  Sonographer:    Carmelita Hartshorn RDCS, FE, PE Referring Phys: [id number] ZANE ADAMS IMPRESSIONS  1. Left ventricular ejection fraction, by estimation, is 50 to 55%. The left ventricle has low normal function. The left ventricle has no regional wall motion abnormalities. The left  ventricular internal cavity size was mildly dilated. Left ventricular diastolic parameters are consistent with Grade I diastolic dysfunction (impaired relaxation).  2. Right ventricular systolic function is normal. The right ventricular size is normal.  3. Left atrial size was moderately dilated.  4. The mitral valve is normal in structure. No  evidence of mitral valve regurgitation. No evidence of mitral stenosis.  5. The aortic valve is normal in structure. Aortic valve regurgitation is trivial. No aortic stenosis is present.  6. The inferior vena cava is normal in size with greater than 50% respiratory variability, suggesting right atrial pressure of 3 mmHg. FINDINGS  Left Ventricle: Left ventricular ejection fraction, by estimation, is 50 to 55%. The left ventricle has low normal function. The left ventricle has no regional wall motion abnormalities. Definity  contrast agent was given IV to delineate the left ventricular endocardial borders. The left ventricular internal cavity size was mildly dilated. There is no left ventricular hypertrophy. Left ventricular diastolic parameters are consistent with Grade I diastolic dysfunction (impaired relaxation). Right Ventricle: The right ventricular size is normal. No increase in right ventricular wall thickness. Right ventricular systolic function is normal. Left Atrium: Left atrial size was moderately dilated. Right Atrium: Right atrial size was normal in size. Pericardium: There is no evidence of pericardial effusion. Mitral Valve: The mitral valve is normal in structure. No evidence of mitral valve regurgitation. No evidence of mitral valve stenosis. Tricuspid Valve: The tricuspid valve is normal in structure. Tricuspid valve regurgitation is not demonstrated. No evidence of tricuspid stenosis. Aortic Valve: The aortic valve is normal in structure. Aortic valve regurgitation is trivial. Aortic regurgitation PHT measures 281 msec. No aortic stenosis is present. Pulmonic Valve: The pulmonic valve was normal in structure. Pulmonic valve regurgitation is not visualized. No evidence of pulmonic stenosis. Aorta: The aortic root is normal in size and structure. Venous: The inferior vena cava is normal in size with greater than 50% respiratory variability, suggesting right atrial pressure of 3 mmHg. IAS/Shunts: No  atrial level shunt detected by color flow Doppler.  LEFT VENTRICLE PLAX 2D LVIDd:         5.80 cm      Diastology LVIDs:         4.80 cm      LV e' medial:    5.33 cm/s LV PW:         1.10 cm      LV E/e' medial:  7.6 LV IVS:        1.00 cm      LV e' lateral:   5.98 cm/s LVOT diam:     2.50 cm      LV E/e' lateral: 6.8 LV SV:         65 LV SV Index:   31 LVOT Area:     4.91 cm  LV Volumes (MOD) LV vol d, MOD A2C: 77.1 ml LV vol d, MOD A4C: 118.0 ml LV vol s, MOD A2C: 32.6 ml LV vol s, MOD A4C: 47.3 ml LV SV MOD A2C:     44.5 ml LV SV MOD A4C:     118.0 ml LV SV MOD BP:      55.2 ml LEFT ATRIUM           Index LA diam:      4.30 cm 2.06 cm/m LA Vol (A2C): 56.5 ml 27.01 ml/m LA Vol (A4C): 98.2 ml 46.94 ml/m  AORTIC VALVE LVOT Vmax:   82.50 cm/s LVOT Vmean:  54.300 cm/s LVOT  VTI:    0.132 m AI PHT:      281 msec  AORTA Ao Root diam: 3.70 cm MITRAL VALVE               TRICUSPID VALVE MV Area (PHT): 3.85 cm    TR Peak grad:   13.8 mmHg MV Decel Time: 197 msec    TR Vmax:        186.00 cm/s MV E velocity: 40.70 cm/s MV A velocity: 70.80 cm/s  SHUNTS MV E/A ratio:  0.57        Systemic VTI:  0.13 m                            Systemic Diam: 2.50 cm Oneil Parchment MD Electronically signed by Oneil Parchment MD Signature Date/Time: 03/21/2024/4:58:29 PM    Final    DG Chest 2 View Result Date: 03/20/2024 EXAM: 2 VIEW(S) XRAY OF THE CHEST 03/20/2024 01:48:00 PM COMPARISON: 10/27/2022 CLINICAL HISTORY: chest pain and shortness of breath FINDINGS: LUNGS AND PLEURA: Bronchial wall thickening in the left lower lung favoring airway infection or inflammation. No pleural effusion. No pneumothorax. HEART AND MEDIASTINUM: Mild cardiomegaly. BONES AND SOFT TISSUES: Bridging enthesopathy of thoracic spine. Old healed posterolateral right seventh rib fracture. IMPRESSION: 1. Bronchial wall thickening in the left lower lung, favoring airway infection or inflammation. Electronically signed by: Norman Gatlin MD 03/20/2024 02:00 PM EST RP  Workstation: [id number]    Scheduled Meds:  allopurinol   300 mg Oral Daily   apixaban   5 mg Oral BID   fluticasone   2 spray Each Nare Daily   furosemide   40 mg Intravenous BID   loratadine   10 mg Oral Daily   losartan   25 mg Oral Daily   metoprolol  succinate  12.5 mg Oral Daily   spironolactone   12.5 mg Oral Daily   Continuous Infusions:   LOS: 2 days   Time spent: 32 minutes  Lonni KANDICE Moose, MD  [facility]  03/22/2024, 7:14 AM   "

## 2024-03-23 ENCOUNTER — Inpatient Hospital Stay (HOSPITAL_COMMUNITY)

## 2024-03-23 LAB — BASIC METABOLIC PANEL WITH GFR
Anion gap: 11 (ref 5–15)
BUN: 14 mg/dL (ref 8–23)
CO2: 29 mmol/L (ref 22–32)
Calcium: 9.3 mg/dL (ref 8.9–10.3)
Chloride: 96 mmol/L — ABNORMAL LOW (ref 98–111)
Creatinine, Ser: 0.95 mg/dL (ref 0.61–1.24)
GFR, Estimated: >60 mL/min
Glucose, Bld: 114 mg/dL — ABNORMAL HIGH (ref 70–99)
Potassium: 2.9 mmol/L — ABNORMAL LOW (ref 3.5–5.1)
Sodium: 137 mmol/L (ref 135–145)

## 2024-03-23 LAB — MAGNESIUM: Magnesium: 2.1 mg/dL (ref 1.7–2.4)

## 2024-03-23 LAB — POTASSIUM: Potassium: 4.2 mmol/L (ref 3.5–5.1)

## 2024-03-23 MED ORDER — ACETAMINOPHEN 325 MG PO TABS: 650.0000 mg | ORAL_TABLET | Freq: Four times a day (QID) | ORAL | Status: AC | PRN

## 2024-03-23 MED ORDER — FUROSEMIDE 20 MG PO TABS: 20.0000 mg | ORAL_TABLET | ORAL | 0 refills | Status: AC

## 2024-03-23 MED ORDER — AZELASTINE HCL 0.1 % NA SOLN: 2.0000 | Freq: Two times a day (BID) | NASAL | 12 refills | Status: AC

## 2024-03-23 MED ORDER — FUROSEMIDE 20 MG PO TABS
20.0000 mg | ORAL_TABLET | ORAL | Status: DC
  Administered 2024-03-23: 20 mg via ORAL
  Filled 2024-03-23: qty 1

## 2024-03-23 MED ORDER — POTASSIUM CHLORIDE 10 MEQ/100ML IV SOLN
10.0000 meq | INTRAVENOUS | Status: AC
  Administered 2024-03-23 (×2): 10 meq via INTRAVENOUS
  Filled 2024-03-23 (×2): qty 100

## 2024-03-23 MED ORDER — FLUTICASONE PROPIONATE 50 MCG/ACT NA SUSP: 2.0000 | Freq: Every day | NASAL | 0 refills | Status: AC

## 2024-03-23 MED ORDER — POTASSIUM CHLORIDE CRYS ER 10 MEQ PO TBCR: 10.0000 meq | EXTENDED_RELEASE_TABLET | ORAL | 0 refills | Status: AC

## 2024-03-23 MED ORDER — BENZONATATE 200 MG PO CAPS: 200.0000 mg | ORAL_CAPSULE | Freq: Three times a day (TID) | ORAL | 0 refills | Status: AC | PRN

## 2024-03-23 MED ORDER — LORATADINE 10 MG PO TABS: 10.0000 mg | ORAL_TABLET | Freq: Every day | ORAL | Status: AC

## 2024-03-23 MED ORDER — POTASSIUM CHLORIDE CRYS ER 20 MEQ PO TBCR
40.0000 meq | EXTENDED_RELEASE_TABLET | ORAL | Status: AC
  Administered 2024-03-23: 40 meq via ORAL
  Filled 2024-03-23: qty 2

## 2024-03-23 MED ORDER — POTASSIUM CHLORIDE CRYS ER 10 MEQ PO TBCR
10.0000 meq | EXTENDED_RELEASE_TABLET | ORAL | Status: DC
  Administered 2024-03-23: 10 meq via ORAL
  Filled 2024-03-23: qty 1

## 2024-03-23 MED ORDER — SPIRONOLACTONE 25 MG PO TABS: 12.5000 mg | ORAL_TABLET | Freq: Every day | ORAL | 0 refills | Status: AC

## 2024-03-23 MED ORDER — AZELASTINE HCL 0.1 % NA SOLN
2.0000 | Freq: Two times a day (BID) | NASAL | Status: DC
  Administered 2024-03-23: 2 via NASAL
  Filled 2024-03-23: qty 30

## 2024-03-23 NOTE — Progress Notes (Signed)
 Patient education provided for AVS discharge instructions. Pt verbalized understanding of all teaching. PIV removed.

## 2024-03-23 NOTE — Progress Notes (Signed)
 " PROGRESS NOTE    Calvin Esparza  FMW:[id number] DOB: [birth year] DOA: 03/20/2024 PCP: Lenon Layman ORN, MD  Subjective: Complains of dry cough.  Had a fever last night.  Feels achy all over.  Denies nasal congestion.  States his breathing is still somewhat short.  No CP. Has been getting up to the bathroom on his own.          Brief Narrative:  As per H&P done on presentation: Calvin Esparza is a 64 y.o. male with medical history significant of heart failure with reduced ejection fraction, nonischemic cardiomyopathy, paroxysmal A-fib status post ablation who presents Emergency Department shortness of breath.  Patient recently had ablation on 12/23.  He thought he had the flu and developed worsening cold-like symptoms.  He presented to the emergency department who was found to be volume overload and labs were presentation which showed respiratory viral panel negative, WBC 9.5, hemoglobin 13.4, potassium 3.1, troponin 22, 18, BNP 479.  Chest x-ray was obtained which showed bronchial thickening.  Cardiology was consulted and recommended evaluation for heart failure as patient appeared volume overloaded.  They recommended IV diuresis as well as home losartan  and metoprolol .   03/21/2024: Seen alongside patient's son.  Patient was admitted with likely CHF exacerbation and viral syndrome.  Patient also describes likely upper airway cough syndrome (postnasal drip).  Will start patient on Flonase  and loratadine .  Continue IV Lasix .  Cardiology input is appreciated.  03/22/2024:03/22/24: K 3.6, Mg 1.5 --> supplemented - Possible viral syndrome.  The myalgias may have represent flu.  He was checked a couple days ago but symptoms have worsened.  Would be a candidate for Tamiflu if we discovered flu.  Repeat respiratory panel.  Cardiology plans 1 more day of IV diuresis then stable from their standpoint for home.  03/23/2024: No fever.  Respiratory panel negative.  Chest x-ray pending.  Lungs are clear.   Suspect he has upper respiratory cough syndrome.  Trial of Astelin  nasal spray.  Potassium was low this morning but supplemented and normal now.  If chest x-ray shows significant infiltrate we will plan on course of Augmentin at discharge.  By my read I do not see a infiltrate.  Should be able to go home later this afternoon.     Assessment & Plan:   Principal Problem:   Volume overload     # Heart failure with reduced ejection fraction exacerbation: - Patient volume overloaded, dyspnea on exertion and orthopnea. -Cardiology input is appreciated.  -Patient is slowly improving. - Edema is slowly resolving. - Strict I's and O's - Euvolemic.  Home on p.o. Lasix .   # Hypokalemia: -Last potassium level of 2.9 - Magnesium  of 1.5. -Repeat K after supplementation now 4.2. -Keep potassium greater than 4.   # Hypomagnesemia: - Magnesium  of 1.5. - IV magnesium  4 g x 1 dose.   # Hypophosphatemia: - Phosphorus of 1.7. - Neutra-Phos 250 mg Q6 hourly x 4 doses. - Repeat renal panel, magnesium  in the morning.   # Upper airway cough syndrome (postnasal drip): - Start Flonase  and loratadine  10 mg p.o. once daily. -Trial of Astelin  nasal spray.   # Hypertension -Continue to optimize.   # Paroxysmal A-fib: - Status post ablation. - Patient is currently in normal sinus rhythm. -continue Eliquis      -continue allopurinol    DVT prophylaxis: SCDs Start: 03/20/24 1907 apixaban  (ELIQUIS ) tablet 5 mg     Code Status: Full Code Family Communication: Discussed with patient. Disposition Plan:  Home Reason for continuing need for hospitalization: Stable for discharge.  Objective: Vitals:   03/22/24 2215 03/23/24 0052 03/23/24 0505 03/23/24 1254  BP: 101/68 110/75 102/77 118/78  Pulse: 99 (!) 101  (!) 108  Resp:    16  Temp: 98.4 F (36.9 C) 98.4 F (36.9 C) 98.6 F (37 C)   TempSrc: Oral Oral    SpO2: 92% 94% 94% 93%  Weight:      Height:        Intake/Output Summary (Last 24  hours) at 03/23/2024 1341 Last data filed at 03/23/2024 0604 Gross per 24 hour  Intake 100 ml  Output 325 ml  Net -225 ml   Filed Weights   03/22/24 0900  Weight: 93.4 kg    Examination:  Physical Exam Constitutional:      Appearance: He is well-developed. He is not toxic-appearing.  HENT:     Head: Normocephalic and atraumatic.  Eyes:     Pupils: Pupils are equal, round, and reactive to light.  Neck:     Vascular: No JVD.  Cardiovascular:     Rate and Rhythm: Normal rate and regular rhythm.     Heart sounds:     No diastolic murmur is present.  Pulmonary:     Effort: Pulmonary effort is normal. No tachypnea, accessory muscle usage or respiratory distress.     Breath sounds: No stridor.  Abdominal:     Tenderness: There is no abdominal tenderness. There is no guarding or rebound.  Musculoskeletal:     Right lower leg: No edema.     Left lower leg: No edema.  Neurological:     General: No focal deficit present.     Mental Status: He is alert.  Psychiatric:        Mood and Affect: Mood normal.     Data Reviewed: I have personally reviewed following labs and imaging studies  CBC: Recent Labs  Lab 03/20/24 1344 03/20/24 2021 03/21/24 0304  WBC 9.5 9.5 7.5  NEUTROABS 7.0  --   --   HGB 13.4 11.6* 11.5*  HCT 40.2 34.8* 34.1*  MCV 96.6 96.9 98.0  PLT 278 237 223   Basic Metabolic Panel: Recent Labs  Lab 03/20/24 1344 03/20/24 2021 03/21/24 0304 03/21/24 1438 03/23/24 0212 03/23/24 0953  NA 137  --  139 137 137  --   K 3.1*  --  3.0* 3.6 2.9* 4.2  CL 97*  --  102 99 96*  --   CO2 28  --  28 25 29   --   GLUCOSE 116*  --  117* 130* 114*  --   BUN 9  --  10 9 14   --   CREATININE 0.97 0.78 0.88 0.94 0.95  --   CALCIUM  9.0  --  8.2* 9.1 9.3  --   MG  --   --   --  1.5* 2.1  --   PHOS  --   --   --  1.7*  --   --    GFR: Estimated Creatinine Clearance: 91 mL/min (by C-G formula based on SCr of 0.95 mg/dL). Liver Function Tests: Recent Labs  Lab  03/20/24 1344 03/21/24 1438  AST 28  --   ALT 15  --   ALKPHOS 96  --   BILITOT 1.2  --   PROT 7.6  --   ALBUMIN 3.7 3.7   Recent Labs  Lab 03/20/24 1344  LIPASE 20   No results for input(s): AMMONIA  in the last 168 hours. Coagulation Profile: No results for input(s): INR, PROTIME in the last 168 hours. Cardiac Enzymes: No results for input(s): CKTOTAL, CKMB, CKMBINDEX, TROPONINI in the last 168 hours. ProBNP, BNP (last 5 results) Recent Labs    04/30/23 1502 03/20/24 1709  PROBNP  --  479.0*  BNP 16.8  --    HbA1C: No results for input(s): HGBA1C in the last 72 hours. CBG: No results for input(s): GLUCAP in the last 168 hours. Lipid Profile: No results for input(s): CHOL, HDL, LDLCALC, TRIG, CHOLHDL, LDLDIRECT in the last 72 hours. Thyroid  Function Tests: No results for input(s): TSH, T4TOTAL, FREET4, T3FREE, THYROIDAB in the last 72 hours. Anemia Panel: No results for input(s): VITAMINB12, FOLATE, FERRITIN, TIBC, IRON, RETICCTPCT in the last 72 hours. Sepsis Labs: No results for input(s): PROCALCITON, LATICACIDVEN in the last 168 hours.  Recent Results (from the past 240 hours)  Resp panel by RT-PCR (RSV, Flu A&B, Covid) Anterior Nasal Swab     Status: None   Collection Time: 03/20/24  1:32 PM   Specimen: Anterior Nasal Swab  Result Value Ref Range Status   SARS Coronavirus 2 by RT PCR NEGATIVE NEGATIVE Final   Influenza A by PCR NEGATIVE NEGATIVE Final   Influenza B by PCR NEGATIVE NEGATIVE Final    Comment: (NOTE) The Xpert Xpress SARS-CoV-2/FLU/RSV plus assay is intended as an aid in the diagnosis of influenza from Nasopharyngeal swab specimens and should not be used as a sole basis for treatment. Nasal washings and aspirates are unacceptable for Xpert Xpress SARS-CoV-2/FLU/RSV testing.  Fact Sheet for Patients: bloggercourse.com  Fact Sheet for Healthcare  Providers: seriousbroker.it  This test is not yet approved or cleared by the [place]  FDA and has been authorized for detection and/or diagnosis of SARS-CoV-2 by FDA under an Emergency Use Authorization (EUA). This EUA will remain in effect (meaning this test can be used) for the duration of the COVID-19 declaration under Section 564(b)(1) of the Act, 21 U.S.C. section 360bbb-3(b)(1), unless the authorization is terminated or revoked.     Resp Syncytial Virus by PCR NEGATIVE NEGATIVE Final    Comment: (NOTE) Fact Sheet for Patients: bloggercourse.com  Fact Sheet for Healthcare Providers: seriousbroker.it  This test is not yet approved or cleared by the [place]  FDA and has been authorized for detection and/or diagnosis of SARS-CoV-2 by FDA under an Emergency Use Authorization (EUA). This EUA will remain in effect (meaning this test can be used) for the duration of the COVID-19 declaration under Section 564(b)(1) of the Act, 21 U.S.C. section 360bbb-3(b)(1), unless the authorization is terminated or revoked.  Performed at [facility], [address], [place], [place] [address]   Resp panel by RT-PCR (RSV, Flu A&B, Covid) Anterior Nasal Swab     Status: None   Collection Time: 03/22/24  9:32 AM   Specimen: Anterior Nasal Swab  Result Value Ref Range Status   SARS Coronavirus 2 by RT PCR NEGATIVE NEGATIVE Final   Influenza A by PCR NEGATIVE NEGATIVE Final   Influenza B by PCR NEGATIVE NEGATIVE Final    Comment: (NOTE) The Xpert Xpress SARS-CoV-2/FLU/RSV plus assay is intended as an aid in the diagnosis of influenza from Nasopharyngeal swab specimens and should not be used as a sole basis for treatment. Nasal washings and aspirates are unacceptable for Xpert Xpress SARS-CoV-2/FLU/RSV testing.  Fact Sheet for Patients: bloggercourse.com  Fact Sheet for  Healthcare Providers: seriousbroker.it  This test is not yet approved or cleared  by the [place]  FDA and has been authorized for detection and/or diagnosis of SARS-CoV-2 by FDA under an Emergency Use Authorization (EUA). This EUA will remain in effect (meaning this test can be used) for the duration of the COVID-19 declaration under Section 564(b)(1) of the Act, 21 U.S.C. section 360bbb-3(b)(1), unless the authorization is terminated or revoked.     Resp Syncytial Virus by PCR NEGATIVE NEGATIVE Final    Comment: (NOTE) Fact Sheet for Patients: bloggercourse.com  Fact Sheet for Healthcare Providers: seriousbroker.it  This test is not yet approved or cleared by the [place]  FDA and has been authorized for detection and/or diagnosis of SARS-CoV-2 by FDA under an Emergency Use Authorization (EUA). This EUA will remain in effect (meaning this test can be used) for the duration of the COVID-19 declaration under Section 564(b)(1) of the Act, 21 U.S.C. section 360bbb-3(b)(1), unless the authorization is terminated or revoked.  Performed at [facility], [address] [place], [place] [address]   Respiratory (~20 pathogens) panel by PCR     Status: None   Collection Time: 03/22/24  9:32 AM   Specimen: Nasopharyngeal Swab; Respiratory  Result Value Ref Range Status   Adenovirus NOT DETECTED NOT DETECTED Final   Coronavirus 229E NOT DETECTED NOT DETECTED Final    Comment: (NOTE) The Coronavirus on the Respiratory Panel, DOES NOT test for the novel  Coronavirus (2019 nCoV)    Coronavirus HKU1 NOT DETECTED NOT DETECTED Final   Coronavirus NL63 NOT DETECTED NOT DETECTED Final   Coronavirus OC43 NOT DETECTED NOT DETECTED Final   Metapneumovirus NOT DETECTED NOT DETECTED Final   Rhinovirus / Enterovirus NOT DETECTED NOT DETECTED Final   Influenza A NOT DETECTED NOT DETECTED Final   Influenza B  NOT DETECTED NOT DETECTED Final   Parainfluenza Virus 1 NOT DETECTED NOT DETECTED Final   Parainfluenza Virus 2 NOT DETECTED NOT DETECTED Final   Parainfluenza Virus 3 NOT DETECTED NOT DETECTED Final   Parainfluenza Virus 4 NOT DETECTED NOT DETECTED Final   Respiratory Syncytial Virus NOT DETECTED NOT DETECTED Final   Bordetella pertussis NOT DETECTED NOT DETECTED Final   Bordetella Parapertussis NOT DETECTED NOT DETECTED Final   Chlamydophila pneumoniae NOT DETECTED NOT DETECTED Final   Mycoplasma pneumoniae NOT DETECTED NOT DETECTED Final    Comment: Performed at [facility], [address], [place], [place] [address]     Radiology Studies: ECHOCARDIOGRAM COMPLETE Result Date: 03/21/2024    ECHOCARDIOGRAM REPORT   Patient Name:   Calvin Esparza Date of Exam: 03/21/2024 Medical Rec #:  [id number]        Height:       69.0 in Accession #:    [id number]       Weight:       206.0 lb Date of Birth:  08/17/1960       BSA:          2.092 m Patient Age:    63 years         BP:           125/91 mmHg Patient Gender: M                HR:           100 bpm. Exam Location:  Inpatient Procedure: 2D Echo, Cardiac Doppler, Color Doppler and Intracardiac            Opacification Agent (Both Spectral and Color Flow Doppler were  utilized during procedure). Indications:    CHF  History:        Patient has prior history of Echocardiogram examinations, most                 recent 06/01/2023. Arrythmias:Atrial Fibrillation; Risk                 Factors:Hypertension and Dyslipidemia.  Sonographer:    Carmelita Hartshorn RDCS, FE, PE Referring Phys: [id number] ZANE ADAMS IMPRESSIONS  1. Left ventricular ejection fraction, by estimation, is 50 to 55%. The left ventricle has low normal function. The left ventricle has no regional wall motion abnormalities. The left ventricular internal cavity size was mildly dilated. Left ventricular diastolic parameters are consistent with Grade I diastolic dysfunction (impaired  relaxation).  2. Right ventricular systolic function is normal. The right ventricular size is normal.  3. Left atrial size was moderately dilated.  4. The mitral valve is normal in structure. No evidence of mitral valve regurgitation. No evidence of mitral stenosis.  5. The aortic valve is normal in structure. Aortic valve regurgitation is trivial. No aortic stenosis is present.  6. The inferior vena cava is normal in size with greater than 50% respiratory variability, suggesting right atrial pressure of 3 mmHg. FINDINGS  Left Ventricle: Left ventricular ejection fraction, by estimation, is 50 to 55%. The left ventricle has low normal function. The left ventricle has no regional wall motion abnormalities. Definity  contrast agent was given IV to delineate the left ventricular endocardial borders. The left ventricular internal cavity size was mildly dilated. There is no left ventricular hypertrophy. Left ventricular diastolic parameters are consistent with Grade I diastolic dysfunction (impaired relaxation). Right Ventricle: The right ventricular size is normal. No increase in right ventricular wall thickness. Right ventricular systolic function is normal. Left Atrium: Left atrial size was moderately dilated. Right Atrium: Right atrial size was normal in size. Pericardium: There is no evidence of pericardial effusion. Mitral Valve: The mitral valve is normal in structure. No evidence of mitral valve regurgitation. No evidence of mitral valve stenosis. Tricuspid Valve: The tricuspid valve is normal in structure. Tricuspid valve regurgitation is not demonstrated. No evidence of tricuspid stenosis. Aortic Valve: The aortic valve is normal in structure. Aortic valve regurgitation is trivial. Aortic regurgitation PHT measures 281 msec. No aortic stenosis is present. Pulmonic Valve: The pulmonic valve was normal in structure. Pulmonic valve regurgitation is not visualized. No evidence of pulmonic stenosis. Aorta: The aortic  root is normal in size and structure. Venous: The inferior vena cava is normal in size with greater than 50% respiratory variability, suggesting right atrial pressure of 3 mmHg. IAS/Shunts: No atrial level shunt detected by color flow Doppler.  LEFT VENTRICLE PLAX 2D LVIDd:         5.80 cm      Diastology LVIDs:         4.80 cm      LV e' medial:    5.33 cm/s LV PW:         1.10 cm      LV E/e' medial:  7.6 LV IVS:        1.00 cm      LV e' lateral:   5.98 cm/s LVOT diam:     2.50 cm      LV E/e' lateral: 6.8 LV SV:         65 LV SV Index:   31 LVOT Area:     4.91 cm  LV Volumes (MOD) LV  vol d, MOD A2C: 77.1 ml LV vol d, MOD A4C: 118.0 ml LV vol s, MOD A2C: 32.6 ml LV vol s, MOD A4C: 47.3 ml LV SV MOD A2C:     44.5 ml LV SV MOD A4C:     118.0 ml LV SV MOD BP:      55.2 ml LEFT ATRIUM           Index LA diam:      4.30 cm 2.06 cm/m LA Vol (A2C): 56.5 ml 27.01 ml/m LA Vol (A4C): 98.2 ml 46.94 ml/m  AORTIC VALVE LVOT Vmax:   82.50 cm/s LVOT Vmean:  54.300 cm/s LVOT VTI:    0.132 m AI PHT:      281 msec  AORTA Ao Root diam: 3.70 cm MITRAL VALVE               TRICUSPID VALVE MV Area (PHT): 3.85 cm    TR Peak grad:   13.8 mmHg MV Decel Time: 197 msec    TR Vmax:        186.00 cm/s MV E velocity: 40.70 cm/s MV A velocity: 70.80 cm/s  SHUNTS MV E/A ratio:  0.57        Systemic VTI:  0.13 m                            Systemic Diam: 2.50 cm Oneil Parchment MD Electronically signed by Oneil Parchment MD Signature Date/Time: 03/21/2024/4:58:29 PM    Final     Scheduled Meds:  allopurinol   300 mg Oral Daily   apixaban   5 mg Oral BID   azelastine   2 spray Each Nare BID   fluticasone   2 spray Each Nare Daily   furosemide   20 mg Oral QODAY   loratadine   10 mg Oral Daily   losartan   25 mg Oral Daily   metoprolol  succinate  12.5 mg Oral Daily   potassium chloride   10 mEq Oral QODAY   spironolactone   12.5 mg Oral Daily   Continuous Infusions:   LOS: 3 days   Time spent: 32 minutes  Lonni KANDICE Moose, MD  [facility]  03/23/2024, 1:41 PM   "

## 2024-03-23 NOTE — Discharge Summary (Signed)
" Physician Discharge Summary   Patient: Calvin Esparza MRN: [id number] DOB: October 23, 1960  Admit date:     03/20/2024  Discharge date: 03/23/2024  Discharge Physician: Lonni KANDICE Moose   PCP: Lenon Layman ORN, MD   Recommendations at discharge:   Follow-up with cardiology as scheduled Follow-up with PCP in the next 1 to 2 weeks  Discharge Diagnoses: Principal Problem:   Volume overload Active Problems:   Acute on chronic systolic CHF (congestive heart failure) (HCC)  Resolved Problems:   * No resolved hospital problems. *   Brief Narrative:  As per H&P done on presentation: Calvin Esparza is a 64 y.o. male with medical history significant of heart failure with reduced ejection fraction, nonischemic cardiomyopathy, paroxysmal A-fib status post ablation who presents Emergency Department shortness of breath.  Patient recently had ablation on 12/23.  He thought he had the flu and developed worsening cold-like symptoms.  He presented to the emergency department who was found to be volume overload and labs were presentation which showed respiratory viral panel negative, WBC 9.5, hemoglobin 13.4, potassium 3.1, troponin 22, 18, BNP 479.  Chest x-ray was obtained which showed bronchial thickening.  Cardiology was consulted and recommended evaluation for heart failure as patient appeared volume overloaded.  They recommended IV diuresis as well as home losartan  and metoprolol .   03/21/2024: Seen alongside patient's son.  Patient was admitted with likely CHF exacerbation and viral syndrome.  Patient also describes likely upper airway cough syndrome (postnasal drip).  Will start patient on Flonase  and loratadine .  Continue IV Lasix .  Cardiology input is appreciated.   03/22/2024:03/22/24: K 3.6, Mg 1.5 --> supplemented - Possible viral syndrome.  The myalgias may have represent flu.  He was checked a couple days ago but symptoms have worsened.  Would be a candidate for Tamiflu if we discovered  flu.  Repeat respiratory panel.  Cardiology plans 1 more day of IV diuresis then stable from their standpoint for home.   03/23/2024: No fever.  Respiratory panel negative.  Chest x-ray pending.  Lungs are clear.  Suspect he has upper respiratory cough syndrome.  Trial of Astelin  nasal spray.  Potassium was low this morning but supplemented and normal now.  If chest x-ray shows significant infiltrate we will plan on course of Augmentin at discharge.  By my read I do not see a infiltrate.  Should be able to go home later this afternoon. CXR formal read was negative with clear lungs. No need for antibiotics.     Assessment & Plan:   Principal Problem:   Volume overload     # Heart failure with reduced ejection fraction exacerbation: - Patient volume overloaded, dyspnea on exertion and orthopnea. -Cardiology input is appreciated.   -Patient is slowly improving. - Edema is slowly resolving. - Strict I's and O's - Euvolemic.  Home on p.o. Lasix .   # Hypokalemia: -Last potassium level of 2.9 - Magnesium  of 1.5. -Repeat K after supplementation now 4.2. -Keep potassium greater than 4.   # Hypomagnesemia: - Magnesium  of 1.5. - IV magnesium  4 g x 1 dose.   # Hypophosphatemia: - Phosphorus of 1.7. - Neutra-Phos 250 mg Q6 hourly x 4 doses. - Repeat renal panel, magnesium  in the morning.   # Upper airway cough syndrome (postnasal drip): - Start Flonase  and loratadine  10 mg p.o. once daily. -Trial of Astelin  nasal spray.   # Hypertension -Continue to optimize.   # Paroxysmal A-fib: - Status post ablation. - Patient is currently  in normal sinus rhythm. -continue Eliquis     Gout -continue allopurinol      DVT prophylaxis: SCDs Start: 03/20/24 1907 apixaban  (ELIQUIS ) tablet 5 mg      Code Status: Full Code Family Communication: Discussed with patient. Disposition Plan: Home Reason for continuing need for hospitalization: Stable for discharge.        Consultants: [facility] Procedures performed: 2D Echo Diet recommendation:  Discharge Diet Orders (From admission, onward)     Start     Ordered   03/23/24 0000  Diet - low sodium heart healthy        03/23/24 1351           Cardiac diet DISCHARGE MEDICATION: Allergies as of 03/23/2024   No Known Allergies      Medication List     STOP taking these medications    GOODY HEADACHE PO       TAKE these medications    acetaminophen  325 MG tablet Commonly known as: TYLENOL  Take 2 tablets (650 mg total) by mouth every 6 (six) hours as needed for mild pain (pain score 1-3) or fever (or Fever >/= 101).   allopurinol  300 MG tablet Commonly known as: ZYLOPRIM  Take 300 mg by mouth daily.   apixaban  5 MG Tabs tablet Commonly known as: Eliquis  Take 1 tablet (5 mg total) by mouth 2 (two) times daily. PLEASE SCHEDULE APPOINTMENT FOR MORE REFILLS [id number] OPTION 2   azelastine  0.1 % nasal spray Commonly known as: ASTELIN  Place 2 sprays into both nostrils 2 (two) times daily. Use in each nostril as directed   benzonatate  200 MG capsule Commonly known as: TESSALON  Take 1 capsule (200 mg total) by mouth 3 (three) times daily as needed for cough.   colchicine  0.6 MG tablet Take 1 tablet (0.6 mg total) by mouth daily as needed.   fluticasone  50 MCG/ACT nasal spray Commonly known as: FLONASE  Place 2 sprays into both nostrils daily.   furosemide  20 MG tablet Commonly known as: LASIX  Take 1 tablet (20 mg total) by mouth every other day. Start taking on: March 25, 2024 What changed:  when to take this reasons to take this   loratadine  10 MG tablet Commonly known as: CLARITIN  Take 1 tablet (10 mg total) by mouth daily. Start taking on: March 24, 2024   losartan  25 MG tablet Commonly known as: COZAAR  Take 1 tablet (25 mg total) by mouth daily.   metoprolol  succinate 25 MG 24 hr tablet Commonly known as: TOPROL -XL TAKE 1/2 TABLET BY MOUTH EVERY DAY   potassium chloride   10 MEQ tablet Commonly known as: KLOR-CON  M Take 1 tablet (10 mEq total) by mouth every other day. Start taking on: March 25, 2024   spironolactone  25 MG tablet Commonly known as: ALDACTONE  Take 0.5 tablets (12.5 mg total) by mouth daily. Start taking on: March 24, 2024        Follow-up Information     Lenon Layman ORN, MD. Schedule an appointment as soon as possible for a visit.   Specialty: Internal Medicine Why: Call the clinic and schedule a hospital follow up in the next 7-10 days. Contact information: [address] [place] [address] [phone]                Discharge Exam: Fredricka Weights   03/22/24 0900  Weight: 93.4 kg   Awake, alert, comfortable, dry cough persists associated with throat clearing HEENT: PERRLA , EOMI, no JVD, Cardiac:  Regular rate and rhythm S1-S2 no murmurs rubs gallops Lungs: Clear throughout Abdomen: Soft nontender nondistended bowel sounds present Extremities: No cyanosis clubbing or edema Neurologic: Grossly nonfocal   Condition at discharge: good  The results of significant diagnostics from this hospitalization (including imaging, microbiology, ancillary and laboratory) are listed below for reference.   Imaging Studies: ECHOCARDIOGRAM COMPLETE Result Date: 03/21/2024    ECHOCARDIOGRAM REPORT   Patient Name:   NASEAN ZAPF Date of Exam: 03/21/2024 Medical Rec #:  [id number]        Height:       69.0 in Accession #:    [id number]       Weight:       206.0 lb Date of Birth:  [birth year]       BSA:          2.092 m Patient Age:    63 years         BP:           125/91 mmHg Patient Gender: M                HR:           100 bpm. Exam Location:  Inpatient Procedure: 2D Echo, Cardiac Doppler, Color Doppler and Intracardiac            Opacification Agent (Both Spectral and Color Flow Doppler were            utilized during procedure). Indications:    CHF  History:        Patient has prior history of  Echocardiogram examinations, most                 recent 06/01/2023. Arrythmias:Atrial Fibrillation; Risk                 Factors:Hypertension and Dyslipidemia.  Sonographer:    Carmelita Hartshorn RDCS, FE, PE Referring Phys: [id number] ZANE ADAMS IMPRESSIONS  1. Left ventricular ejection fraction, by estimation, is 50 to 55%. The left ventricle has low normal function. The left ventricle has no regional wall motion abnormalities. The left ventricular internal cavity size was mildly dilated. Left ventricular diastolic parameters are consistent with Grade I diastolic dysfunction (impaired relaxation).  2. Right ventricular systolic function is normal. The right ventricular size is normal.  3. Left atrial size was moderately dilated.  4. The mitral valve is normal in structure. No evidence of mitral valve regurgitation. No evidence of mitral stenosis.  5. The aortic valve is normal in structure. Aortic valve regurgitation is trivial. No aortic stenosis is present.  6. The inferior vena cava is normal in size with greater than 50% respiratory variability, suggesting right atrial pressure of 3 mmHg. FINDINGS  Left Ventricle: Left ventricular ejection fraction, by estimation, is 50 to 55%. The left ventricle has low normal function. The left ventricle has no regional wall motion abnormalities. Definity  contrast agent was given IV to delineate the left ventricular endocardial borders. The left ventricular internal cavity size was mildly dilated. There is no left ventricular hypertrophy. Left ventricular diastolic parameters are consistent with Grade I diastolic dysfunction (impaired relaxation). Right Ventricle: The right ventricular size is normal. No increase in right ventricular wall thickness. Right ventricular systolic function is normal. Left Atrium: Left atrial size was moderately dilated. Right Atrium: Right atrial size was normal in size. Pericardium: There is no evidence of pericardial effusion. Mitral Valve: The mitral  valve is normal in structure. No evidence of mitral valve regurgitation. No evidence  of mitral valve stenosis. Tricuspid Valve: The tricuspid valve is normal in structure. Tricuspid valve regurgitation is not demonstrated. No evidence of tricuspid stenosis. Aortic Valve: The aortic valve is normal in structure. Aortic valve regurgitation is trivial. Aortic regurgitation PHT measures 281 msec. No aortic stenosis is present. Pulmonic Valve: The pulmonic valve was normal in structure. Pulmonic valve regurgitation is not visualized. No evidence of pulmonic stenosis. Aorta: The aortic root is normal in size and structure. Venous: The inferior vena cava is normal in size with greater than 50% respiratory variability, suggesting right atrial pressure of 3 mmHg. IAS/Shunts: No atrial level shunt detected by color flow Doppler.  LEFT VENTRICLE PLAX 2D LVIDd:         5.80 cm      Diastology LVIDs:         4.80 cm      LV e' medial:    5.33 cm/s LV PW:         1.10 cm      LV E/e' medial:  7.6 LV IVS:        1.00 cm      LV e' lateral:   5.98 cm/s LVOT diam:     2.50 cm      LV E/e' lateral: 6.8 LV SV:         65 LV SV Index:   31 LVOT Area:     4.91 cm  LV Volumes (MOD) LV vol d, MOD A2C: 77.1 ml LV vol d, MOD A4C: 118.0 ml LV vol s, MOD A2C: 32.6 ml LV vol s, MOD A4C: 47.3 ml LV SV MOD A2C:     44.5 ml LV SV MOD A4C:     118.0 ml LV SV MOD BP:      55.2 ml LEFT ATRIUM           Index LA diam:      4.30 cm 2.06 cm/m LA Vol (A2C): 56.5 ml 27.01 ml/m LA Vol (A4C): 98.2 ml 46.94 ml/m  AORTIC VALVE LVOT Vmax:   82.50 cm/s LVOT Vmean:  54.300 cm/s LVOT VTI:    0.132 m AI PHT:      281 msec  AORTA Ao Root diam: 3.70 cm MITRAL VALVE               TRICUSPID VALVE MV Area (PHT): 3.85 cm    TR Peak grad:   13.8 mmHg MV Decel Time: 197 msec    TR Vmax:        186.00 cm/s MV E velocity: 40.70 cm/s MV A velocity: 70.80 cm/s  SHUNTS MV E/A ratio:  0.57        Systemic VTI:  0.13 m                            Systemic Diam: 2.50 cm Oneil Parchment MD Electronically signed by Oneil Parchment MD Signature Date/Time: 03/21/2024/4:58:29 PM    Final    DG Chest 2 View Result Date: 03/20/2024 EXAM: 2 VIEW(S) XRAY OF THE CHEST 03/20/2024 01:48:00 PM COMPARISON: 10/27/2022 CLINICAL HISTORY: chest pain and shortness of breath FINDINGS: LUNGS AND PLEURA: Bronchial wall thickening in the left lower lung favoring airway infection or inflammation. No pleural effusion. No pneumothorax. HEART AND MEDIASTINUM: Mild cardiomegaly. BONES AND SOFT TISSUES: Bridging enthesopathy of thoracic spine. Old healed posterolateral right seventh rib fracture. IMPRESSION: 1. Bronchial wall thickening in the left lower lung, favoring airway infection or inflammation.  Electronically signed by: Norman Gatlin MD 03/20/2024 02:00 PM EST RP Workstation: [id number]   EP STUDY Result Date: 03/08/2024 CONCLUSIONS: 1. Successful PVI. 3. Intracardiac echo reveals normal LV size and function, trivial pericardial effusion. 4. No early apparent complications. 5. Resume Eliquis  in recovery area. 6. Stop amiodarone . [name], MD, [facility], [facility] Cardiac Electrophysiology    Microbiology: Results for orders placed or performed during the hospital encounter of 03/20/24  Resp panel by RT-PCR (RSV, Flu A&B, Covid) Anterior Nasal Swab     Status: None   Collection Time: 03/20/24  1:32 PM   Specimen: Anterior Nasal Swab  Result Value Ref Range Status   SARS Coronavirus 2 by RT PCR NEGATIVE NEGATIVE Final   Influenza A by PCR NEGATIVE NEGATIVE Final   Influenza B by PCR NEGATIVE NEGATIVE Final    Comment: (NOTE) The Xpert Xpress SARS-CoV-2/FLU/RSV plus assay is intended as an aid in the diagnosis of influenza from Nasopharyngeal swab specimens and should not be used as a sole basis for treatment. Nasal washings and aspirates are unacceptable for Xpert Xpress SARS-CoV-2/FLU/RSV testing.  Fact Sheet for Patients: bloggercourse.com  Fact Sheet for Healthcare  Providers: seriousbroker.it  This test is not yet approved or cleared by the [place]  FDA and has been authorized for detection and/or diagnosis of SARS-CoV-2 by FDA under an Emergency Use Authorization (EUA). This EUA will remain in effect (meaning this test can be used) for the duration of the COVID-19 declaration under Section 564(b)(1) of the Act, 21 U.S.C. section 360bbb-3(b)(1), unless the authorization is terminated or revoked.     Resp Syncytial Virus by PCR NEGATIVE NEGATIVE Final    Comment: (NOTE) Fact Sheet for Patients: bloggercourse.com  Fact Sheet for Healthcare Providers: seriousbroker.it  This test is not yet approved or cleared by the [place]  FDA and has been authorized for detection and/or diagnosis of SARS-CoV-2 by FDA under an Emergency Use Authorization (EUA). This EUA will remain in effect (meaning this test can be used) for the duration of the COVID-19 declaration under Section 564(b)(1) of the Act, 21 U.S.C. section 360bbb-3(b)(1), unless the authorization is terminated or revoked.  Performed at [facility], [address] [place], [place] [address]   Resp panel by RT-PCR (RSV, Flu A&B, Covid) Anterior Nasal Swab     Status: None   Collection Time: 03/22/24  9:32 AM   Specimen: Anterior Nasal Swab  Result Value Ref Range Status   SARS Coronavirus 2 by RT PCR NEGATIVE NEGATIVE Final   Influenza A by PCR NEGATIVE NEGATIVE Final   Influenza B by PCR NEGATIVE NEGATIVE Final    Comment: (NOTE) The Xpert Xpress SARS-CoV-2/FLU/RSV plus assay is intended as an aid in the diagnosis of influenza from Nasopharyngeal swab specimens and should not be used as a sole basis for treatment. Nasal washings and aspirates are unacceptable for Xpert Xpress SARS-CoV-2/FLU/RSV testing.  Fact Sheet for Patients: bloggercourse.com  Fact Sheet for  Healthcare Providers: seriousbroker.it  This test is not yet approved or cleared by the [place]  FDA and has been authorized for detection and/or diagnosis of SARS-CoV-2 by FDA under an Emergency Use Authorization (EUA). This EUA will remain in effect (meaning this test can be used) for the duration of the COVID-19 declaration under Section 564(b)(1) of the Act, 21 U.S.C. section 360bbb-3(b)(1), unless the authorization is terminated or revoked.     Resp Syncytial Virus by PCR NEGATIVE NEGATIVE Final    Comment: (NOTE) Fact Sheet for Patients: bloggercourse.com  Fact Sheet for Healthcare Providers: seriousbroker.it  This test is not yet approved or cleared by the [place]  FDA and has been authorized for detection and/or diagnosis of SARS-CoV-2 by FDA under an Emergency Use Authorization (EUA). This EUA will remain in effect (meaning this test can be used) for the duration of the COVID-19 declaration under Section 564(b)(1) of the Act, 21 U.S.C. section 360bbb-3(b)(1), unless the authorization is terminated or revoked.  Performed at [facility], [address], [place], [place] [address]   Respiratory (~20 pathogens) panel by PCR     Status: None   Collection Time: 03/22/24  9:32 AM   Specimen: Nasopharyngeal Swab; Respiratory  Result Value Ref Range Status   Adenovirus NOT DETECTED NOT DETECTED Final   Coronavirus 229E NOT DETECTED NOT DETECTED Final    Comment: (NOTE) The Coronavirus on the Respiratory Panel, DOES NOT test for the novel  Coronavirus (2019 nCoV)    Coronavirus HKU1 NOT DETECTED NOT DETECTED Final   Coronavirus NL63 NOT DETECTED NOT DETECTED Final   Coronavirus OC43 NOT DETECTED NOT DETECTED Final   Metapneumovirus NOT DETECTED NOT DETECTED Final   Rhinovirus / Enterovirus NOT DETECTED NOT DETECTED Final   Influenza A NOT DETECTED NOT DETECTED Final   Influenza B  NOT DETECTED NOT DETECTED Final   Parainfluenza Virus 1 NOT DETECTED NOT DETECTED Final   Parainfluenza Virus 2 NOT DETECTED NOT DETECTED Final   Parainfluenza Virus 3 NOT DETECTED NOT DETECTED Final   Parainfluenza Virus 4 NOT DETECTED NOT DETECTED Final   Respiratory Syncytial Virus NOT DETECTED NOT DETECTED Final   Bordetella pertussis NOT DETECTED NOT DETECTED Final   Bordetella Parapertussis NOT DETECTED NOT DETECTED Final   Chlamydophila pneumoniae NOT DETECTED NOT DETECTED Final   Mycoplasma pneumoniae NOT DETECTED NOT DETECTED Final    Comment: Performed at [facility], [address] [place], [place] [address]    Labs: CBC: Recent Labs  Lab 03/20/24 1344 03/20/24 2021 03/21/24 0304  WBC 9.5 9.5 7.5  NEUTROABS 7.0  --   --   HGB 13.4 11.6* 11.5*  HCT 40.2 34.8* 34.1*  MCV 96.6 96.9 98.0  PLT 278 237 223   Basic Metabolic Panel: Recent Labs  Lab 03/20/24 1344 03/20/24 2021 03/21/24 0304 03/21/24 1438 03/23/24 0212 03/23/24 0953  NA 137  --  139 137 137  --   K 3.1*  --  3.0* 3.6 2.9* 4.2  CL 97*  --  102 99 96*  --   CO2 28  --  28 25 29   --   GLUCOSE 116*  --  117* 130* 114*  --   BUN 9  --  10 9 14   --   CREATININE 0.97 0.78 0.88 0.94 0.95  --   CALCIUM  9.0  --  8.2* 9.1 9.3  --   MG  --   --   --  1.5* 2.1  --   PHOS  --   --   --  1.7*  --   --    Liver Function Tests: Recent Labs  Lab 03/20/24 1344 03/21/24 1438  AST 28  --   ALT 15  --   ALKPHOS 96  --   BILITOT 1.2  --   PROT 7.6  --   ALBUMIN 3.7 3.7   CBG: No results for input(s): GLUCAP in the last 168 hours.  Discharge time spent: greater than 30 minutes.  Signed: Lonni KANDICE Moose, MD [facility]  03/23/2024 °"

## 2024-03-23 NOTE — Progress Notes (Signed)
 "  Progress Note  Patient Name: Calvin Esparza Date of Encounter: 03/23/2024  Primary Cardiologist: Evalene Lunger, MD   Subjective   Patient seen and examined at his bedside/ Stilll coughing now with sputum   Inpatient Medications    Scheduled Meds:  allopurinol   300 mg Oral Daily   apixaban   5 mg Oral BID   fluticasone   2 spray Each Nare Daily   loratadine   10 mg Oral Daily   losartan   25 mg Oral Daily   metoprolol  succinate  12.5 mg Oral Daily   spironolactone   12.5 mg Oral Daily   Continuous Infusions:  PRN Meds: acetaminophen  **OR** acetaminophen , benzonatate , diphenhydrAMINE , ipratropium-albuterol , ondansetron  **OR** ondansetron  (ZOFRAN ) IV, phenol   Vital Signs    Vitals:   03/22/24 1850 03/22/24 2215 03/23/24 0052 03/23/24 0505  BP: (!) 125/90 101/68 110/75 102/77  Pulse: 86 99 (!) 101   Resp:      Temp: 97.9 F (36.6 C) 98.4 F (36.9 C) 98.4 F (36.9 C) 98.6 F (37 C)  TempSrc: Oral Oral Oral   SpO2: 94% 92% 94% 94%  Weight:      Height:        Intake/Output Summary (Last 24 hours) at 03/23/2024 0815 Last data filed at 03/23/2024 0604 Gross per 24 hour  Intake 100 ml  Output 325 ml  Net -225 ml   Filed Weights   03/22/24 0900  Weight: 93.4 kg    Telemetry     - Personally Reviewed  ECG     - Personally Reviewed  Physical Exam     General: Comfortable Head: Atraumatic, normal size  Eyes: PEERLA, EOMI  Neck: Supple, normal JVD Cardiac: Normal S1, S2; RRR; no murmurs, rubs, or gallops Lungs: Clear to auscultation bilaterally Abd: Soft, nontender, no hepatomegaly  Ext: warm, no edema Musculoskeletal: No deformities, BUE and BLE strength normal and equal Skin: Warm and dry, no rashes     Labs    Chemistry Recent Labs  Lab 03/20/24 1344 03/20/24 2021 03/21/24 0304 03/21/24 1438 03/23/24 0212  NA 137  --  139 137 137  K 3.1*  --  3.0* 3.6 2.9*  CL 97*  --  102 99 96*  CO2 28  --  28 25 29   GLUCOSE 116*  --  117* 130* 114*   BUN 9  --  10 9 14   CREATININE 0.97   < > 0.88 0.94 0.95  CALCIUM  9.0  --  8.2* 9.1 9.3  PROT 7.6  --   --   --   --   ALBUMIN 3.7  --   --  3.7  --   AST 28  --   --   --   --   ALT 15  --   --   --   --   ALKPHOS 96  --   --   --   --   BILITOT 1.2  --   --   --   --   GFRNONAA >60   < > >60 >60 >60  ANIONGAP 12  --  9 13 11    < > = values in this interval not displayed.     Hematology Recent Labs  Lab 03/20/24 1344 03/20/24 2021 03/21/24 0304  WBC 9.5 9.5 7.5  RBC 4.16* 3.59* 3.48*  HGB 13.4 11.6* 11.5*  HCT 40.2 34.8* 34.1*  MCV 96.6 96.9 98.0  MCH 32.2 32.3 33.0  MCHC 33.3 33.3 33.7  RDW 13.7 13.5 13.5  PLT 278 237 223    Cardiac EnzymesNo results for input(s): TROPONINI in the last 168 hours. No results for input(s): TROPIPOC in the last 168 hours.   BNP Recent Labs  Lab 03/20/24 1709  PROBNP 479.0*     DDimer No results for input(s): DDIMER in the last 168 hours.   Radiology    ECHOCARDIOGRAM COMPLETE Result Date: 03/21/2024    ECHOCARDIOGRAM REPORT   Patient Name:   Calvin Esparza Date of Exam: 03/21/2024 Medical Rec #:  [id number]        Height:       69.0 in Accession #:    [id number]       Weight:       206.0 lb Date of Birth:  1961/01/12       BSA:          2.092 m Patient Age:    63 years         BP:           125/91 mmHg Patient Gender: M                HR:           100 bpm. Exam Location:  Inpatient Procedure: 2D Echo, Cardiac Doppler, Color Doppler and Intracardiac            Opacification Agent (Both Spectral and Color Flow Doppler were            utilized during procedure). Indications:    CHF  History:        Patient has prior history of Echocardiogram examinations, most                 recent 06/01/2023. Arrythmias:Atrial Fibrillation; Risk                 Factors:Hypertension and Dyslipidemia.  Sonographer:    Carmelita Hartshorn RDCS, FE, PE Referring Phys: [id number] ZANE ADAMS IMPRESSIONS  1. Left ventricular ejection fraction, by estimation, is 50  to 55%. The left ventricle has low normal function. The left ventricle has no regional wall motion abnormalities. The left ventricular internal cavity size was mildly dilated. Left ventricular diastolic parameters are consistent with Grade I diastolic dysfunction (impaired relaxation).  2. Right ventricular systolic function is normal. The right ventricular size is normal.  3. Left atrial size was moderately dilated.  4. The mitral valve is normal in structure. No evidence of mitral valve regurgitation. No evidence of mitral stenosis.  5. The aortic valve is normal in structure. Aortic valve regurgitation is trivial. No aortic stenosis is present.  6. The inferior vena cava is normal in size with greater than 50% respiratory variability, suggesting right atrial pressure of 3 mmHg. FINDINGS  Left Ventricle: Left ventricular ejection fraction, by estimation, is 50 to 55%. The left ventricle has low normal function. The left ventricle has no regional wall motion abnormalities. Definity  contrast agent was given IV to delineate the left ventricular endocardial borders. The left ventricular internal cavity size was mildly dilated. There is no left ventricular hypertrophy. Left ventricular diastolic parameters are consistent with Grade I diastolic dysfunction (impaired relaxation). Right Ventricle: The right ventricular size is normal. No increase in right ventricular wall thickness. Right ventricular systolic function is normal. Left Atrium: Left atrial size was moderately dilated. Right Atrium: Right atrial size was normal in size. Pericardium: There is no evidence of pericardial effusion. Mitral Valve: The mitral valve is normal in structure. No evidence of mitral valve regurgitation.  No evidence of mitral valve stenosis. Tricuspid Valve: The tricuspid valve is normal in structure. Tricuspid valve regurgitation is not demonstrated. No evidence of tricuspid stenosis. Aortic Valve: The aortic valve is normal in structure.  Aortic valve regurgitation is trivial. Aortic regurgitation PHT measures 281 msec. No aortic stenosis is present. Pulmonic Valve: The pulmonic valve was normal in structure. Pulmonic valve regurgitation is not visualized. No evidence of pulmonic stenosis. Aorta: The aortic root is normal in size and structure. Venous: The inferior vena cava is normal in size with greater than 50% respiratory variability, suggesting right atrial pressure of 3 mmHg. IAS/Shunts: No atrial level shunt detected by color flow Doppler.  LEFT VENTRICLE PLAX 2D LVIDd:         5.80 cm      Diastology LVIDs:         4.80 cm      LV e' medial:    5.33 cm/s LV PW:         1.10 cm      LV E/e' medial:  7.6 LV IVS:        1.00 cm      LV e' lateral:   5.98 cm/s LVOT diam:     2.50 cm      LV E/e' lateral: 6.8 LV SV:         65 LV SV Index:   31 LVOT Area:     4.91 cm  LV Volumes (MOD) LV vol d, MOD A2C: 77.1 ml LV vol d, MOD A4C: 118.0 ml LV vol s, MOD A2C: 32.6 ml LV vol s, MOD A4C: 47.3 ml LV SV MOD A2C:     44.5 ml LV SV MOD A4C:     118.0 ml LV SV MOD BP:      55.2 ml LEFT ATRIUM           Index LA diam:      4.30 cm 2.06 cm/m LA Vol (A2C): 56.5 ml 27.01 ml/m LA Vol (A4C): 98.2 ml 46.94 ml/m  AORTIC VALVE LVOT Vmax:   82.50 cm/s LVOT Vmean:  54.300 cm/s LVOT VTI:    0.132 m AI PHT:      281 msec  AORTA Ao Root diam: 3.70 cm MITRAL VALVE               TRICUSPID VALVE MV Area (PHT): 3.85 cm    TR Peak grad:   13.8 mmHg MV Decel Time: 197 msec    TR Vmax:        186.00 cm/s MV E velocity: 40.70 cm/s MV A velocity: 70.80 cm/s  SHUNTS MV E/A ratio:  0.57        Systemic VTI:  0.13 m                            Systemic Diam: 2.50 cm Oneil Parchment MD Electronically signed by Oneil Parchment MD Signature Date/Time: 03/21/2024/4:58:29 PM    Final     Cardiac Studies   Echo reviewed   Patient Profile     64 y.o. male with shortness of breath and Acute on chronic diastolic heart failure.   Assessment & Plan    Acute on chronic diastolic heart  failure  PAF  Hypertensive heart disease   At a negative balance, transition to po Lasix  20 mg , with KCL 10 meq every other day I do believe there may also be a chronic respiratory component may benefit  from an outpatient sleep study.  Cont Toprol  xl 12.5 mg and Eliquis  5 mg BID.  Blood pressure is at target continue current regimen:  Now making sputum - doubt all postnasal drip but will defer to the primary team for treatment.     For questions or updates, please contact [facility] Please consult [email or web address] for contact info under Cardiology/STEMI.      Signed, Brynn Reznik, DO  03/23/2024, 8:15 AM    "

## 2024-04-01 NOTE — Progress Notes (Unsigned)
 "     Electrophysiology Clinic Note    Date:  04/01/2024  Patient ID:  Calvin Esparza Aug 21, 1960, MRN [id number] PCP:  Lenon Layman ORN, MD  Cardiologist:  Evalene Lunger, MD  Electrophysiologist:  [name], MD  Electrophysiology APP:  Adonis Ryther, NP    ***refresh  Discussed the use of AI scribe software for clinical note transcription with the patient, who gave verbal consent to proceed.   Patient Profile    Chief Complaint: ***  History of Present Illness: Calvin Esparza is a 64 y.o. male with PMH notable for persis AFib, HFimpEF, NICM, recurrent PE, cocaine use, ETOH use; seen today for [name], MD for routine electrophysiology follow-up s/p Ablation.  He has a long history of Afib with vascilating EF, AF managed with amiodarone .  He is s/p AF ablation w isolation of pulm veins on 12/23 by Dr. Kitty. Amiodarone  was stopped immediately after procedure.  He presented to the ER earlier this month w worsening SOB - K 3.0, proBNP slightly elevated. Diuresed, given K supplement and discharged. He was maintaining sinus rhythm throughout hospital stay.   On follow-up today, *** AF burden, symptoms *** palpitations *** bleeding concerns  - update K   Since last being seen in our clinic the patient reports doing ***.  he denies chest pain, palpitations, dyspnea, PND, orthopnea, nausea, vomiting, dizziness, syncope, edema, weight gain, or early satiety.       Arrhythmia/Device History Amiodarone  - stopped 02/2024 after ablation    ROS:  Please see the history of present illness. All other systems are reviewed and otherwise negative.    Physical Exam    VS:  There were no vitals taken for this visit. BMI: There is no height or weight on file to calculate BMI.           Wt Readings from Last 3 Encounters:  03/22/24 205 lb 14.6 oz (93.4 kg)  03/08/24 206 lb (93.4 kg)  01/05/24 204 lb (92.5 kg)     ***  GEN- The patient is well appearing,  alert and oriented x 3 today.   Lungs- Clear to ausculation bilaterally, normal work of breathing.  Heart- {Blank single:19197::Regular,Irregularly irregular} rate and rhythm, ***no murmurs, rubs or gallops Extremities- {EDEMA LEVEL:28147::No} peripheral edema, warm, dry   Studies Reviewed   Previous EP, cardiology notes.    EKG is ordered. Personal review of EKG from today shows:  ***        TTE, 03/21/2024 1. Left ventricular ejection fraction, by estimation, is 50 to 55%. The left ventricle has low normal function. The left ventricle has no regional wall motion abnormalities. The left ventricular internal cavity size was mildly dilated. Left ventricular diastolic parameters are consistent with Grade I diastolic dysfunction (impaired relaxation).   2. Right ventricular systolic function is normal. The right ventricular size is normal.   3. Left atrial size was moderately dilated.   4. The mitral valve is normal in structure. No evidence of mitral valve regurgitation. No evidence of mitral stenosis.   5. The aortic valve is normal in structure. Aortic valve regurgitation is trivial. No aortic stenosis is present.   6. The inferior vena cava is normal in size with greater than 50% respiratory variability, suggesting right atrial pressure of 3 mmHg.   TTE, 06/01/2023  1. Left ventricular ejection fraction, by estimation, is 45 to 50%. Left ventricular ejection fraction by 3D volume is 52 %. The left ventricle has mildly decreased function. The  left ventricle demonstrates global hypokinesis. The left ventricular internal cavity size was mildly dilated. Left ventricular diastolic  parameters were normal. The average left ventricular global longitudinal strain is -14.7 %. The global longitudinal strain is abnormal.   2. Right ventricular systolic function is normal. The right ventricular size is normal. Tricuspid regurgitation signal is inadequate for assessing PA pressure.   3. Left atrial  size was mildly dilated.   4. The mitral valve is normal in structure. Mild mitral valve regurgitation. No evidence of mitral stenosis.   5. The aortic valve is normal in structure. Aortic valve regurgitation is trivial. No aortic stenosis is present.   6. The inferior vena cava is normal in size with greater than 50% respiratory variability, suggesting right atrial pressure of 3 mmHg.   Comparison(s): A prior study was performed on 10/24/2022. Changes from prior  study are noted. The left ventricular function has improved.   TTE, 10/24/2022  1. Left ventricular ejection fraction, by estimation, is 20 to 25%. Left ventricular ejection fraction by PLAX is 23 %. The left ventricle has severely decreased function. The left ventricle demonstrates global hypokinesis. Left ventricular diastolic parameters are indeterminate.   2. Right ventricular systolic function is mildly reduced. The right ventricular size is mildly enlarged. There is mildly elevated pulmonary artery systolic pressure.   3. Left atrial size was severely dilated.   4. Right atrial size was mildly dilated.   5. The mitral valve is normal in structure. Mild to moderate mitral valve regurgitation.   6. The aortic valve is tricuspid. Aortic valve regurgitation is mild.   7. The inferior vena cava is dilated in size with >50% respiratory variability, suggesting right atrial pressure of 8 mmHg.     Assessment and Plan     #) parox Afib S/p AF ablation Was previously on amiodarone , stopped immediately post-procedure ***  #) Hypercoag d/t *** afib CHA2DS2-VASc Score = at least 2 [CHF History: 1, HTN History: 1, Diabetes History: 0, Stroke History: 0, Vascular Disease History: 0, Age Score: 0, Gender Score: 0].  Therefore, the patient's annual risk of stroke is 2.2 %.     {Confirm score is correct.  If not, click here to update score.  REFRESH note.  :1}   Stroke ppx - 5mg  eliquis  BID, appropriately dosed No bleeding concerns   #)  HFimpEF Rhythm mgmt indicated   #) ***   {Are you ordering a CV Procedure (e.g. stress test, cath, DCCV, TEE, etc)?   Press F2        :[id number]}   Current medicines are reviewed at length with the patient today.   The patient {ACTIONS; HAS/DOES NOT HAVE:19233} concerns regarding his medicines.  The following changes were made today:  {NONE DEFAULTED:18576}  Labs/ tests ordered today include: *** No orders of the defined types were placed in this encounter.    Disposition: Follow up with {EPMDS:28135::EP Team} or EP APP {EPFOLLOW UP:28173}   Signed, Chantal Needle, NP  04/01/24  10:30 AM  Electrophysiology [facility] "

## 2024-04-05 ENCOUNTER — Ambulatory Visit: Payer: Self-pay | Admitting: Cardiology

## 2024-04-05 DIAGNOSIS — I4819 Other persistent atrial fibrillation: Secondary | ICD-10-CM

## 2024-04-05 DIAGNOSIS — I5022 Chronic systolic (congestive) heart failure: Secondary | ICD-10-CM

## 2024-04-05 DIAGNOSIS — D6869 Other thrombophilia: Secondary | ICD-10-CM

## 2024-04-19 ENCOUNTER — Ambulatory Visit: Admitting: Cardiology

## 2024-05-12 ENCOUNTER — Ambulatory Visit: Admitting: Cardiology

## 2024-06-13 ENCOUNTER — Ambulatory Visit: Payer: Self-pay | Admitting: Cardiology
